# Patient Record
Sex: Female | Born: 1960 | Race: Black or African American | Hispanic: No | Marital: Single | Smoking: Never smoker
Health system: Southern US, Community
[De-identification: ages and names within clinical notes are randomized; demographics above are authoritative.]

## PROBLEM LIST (undated history)

## (undated) DIAGNOSIS — E119 Type 2 diabetes mellitus without complications: Secondary | ICD-10-CM

## (undated) DIAGNOSIS — R0789 Other chest pain: Secondary | ICD-10-CM

## (undated) DIAGNOSIS — I499 Cardiac arrhythmia, unspecified: Secondary | ICD-10-CM

## (undated) DIAGNOSIS — I5189 Other ill-defined heart diseases: Secondary | ICD-10-CM

## (undated) DIAGNOSIS — D509 Iron deficiency anemia, unspecified: Secondary | ICD-10-CM

## (undated) DIAGNOSIS — Q244 Congenital subaortic stenosis: Secondary | ICD-10-CM

## (undated) DIAGNOSIS — I480 Paroxysmal atrial fibrillation: Secondary | ICD-10-CM

## (undated) DIAGNOSIS — F1911 Other psychoactive substance abuse, in remission: Secondary | ICD-10-CM

## (undated) DIAGNOSIS — K219 Gastro-esophageal reflux disease without esophagitis: Secondary | ICD-10-CM

## (undated) DIAGNOSIS — R011 Cardiac murmur, unspecified: Secondary | ICD-10-CM

## (undated) DIAGNOSIS — I10 Essential (primary) hypertension: Secondary | ICD-10-CM

## (undated) HISTORY — DX: Type 2 diabetes mellitus without complications: E11.9

## (undated) HISTORY — DX: Paroxysmal atrial fibrillation: I48.0

## (undated) HISTORY — DX: Other ill-defined heart diseases: I51.89

---

## 2004-06-08 ENCOUNTER — Ambulatory Visit (HOSPITAL_COMMUNITY): Admission: RE | Admit: 2004-06-08 | Discharge: 2004-06-08 | Payer: Self-pay | Admitting: Internal Medicine

## 2004-06-18 ENCOUNTER — Emergency Department (HOSPITAL_COMMUNITY): Admission: EM | Admit: 2004-06-18 | Discharge: 2004-06-18 | Payer: Self-pay | Admitting: Family Medicine

## 2004-07-06 ENCOUNTER — Other Ambulatory Visit: Admission: RE | Admit: 2004-07-06 | Discharge: 2004-07-06 | Payer: Self-pay | Admitting: Family Medicine

## 2004-08-13 ENCOUNTER — Ambulatory Visit: Payer: Self-pay | Admitting: Family Medicine

## 2005-09-06 ENCOUNTER — Ambulatory Visit: Payer: Self-pay | Admitting: Family Medicine

## 2005-09-09 ENCOUNTER — Ambulatory Visit: Payer: Self-pay | Admitting: Family Medicine

## 2005-10-21 ENCOUNTER — Encounter (INDEPENDENT_AMBULATORY_CARE_PROVIDER_SITE_OTHER): Payer: Self-pay | Admitting: Family Medicine

## 2005-11-08 ENCOUNTER — Ambulatory Visit: Payer: Self-pay | Admitting: Family Medicine

## 2005-11-08 ENCOUNTER — Other Ambulatory Visit: Admission: RE | Admit: 2005-11-08 | Discharge: 2005-11-08 | Payer: Self-pay | Admitting: Family Medicine

## 2005-11-26 ENCOUNTER — Ambulatory Visit: Payer: Self-pay | Admitting: Internal Medicine

## 2005-11-27 ENCOUNTER — Inpatient Hospital Stay (HOSPITAL_COMMUNITY): Admission: EM | Admit: 2005-11-27 | Discharge: 2005-11-30 | Payer: Self-pay | Admitting: Emergency Medicine

## 2005-11-28 ENCOUNTER — Encounter: Payer: Self-pay | Admitting: Cardiology

## 2005-12-02 ENCOUNTER — Emergency Department (HOSPITAL_COMMUNITY): Admission: EM | Admit: 2005-12-02 | Discharge: 2005-12-02 | Payer: Self-pay | Admitting: *Deleted

## 2005-12-05 ENCOUNTER — Ambulatory Visit: Payer: Self-pay | Admitting: Family Medicine

## 2005-12-08 ENCOUNTER — Ambulatory Visit: Payer: Self-pay | Admitting: Family Medicine

## 2005-12-12 ENCOUNTER — Emergency Department (HOSPITAL_COMMUNITY): Admission: EM | Admit: 2005-12-12 | Discharge: 2005-12-12 | Payer: Self-pay | Admitting: Emergency Medicine

## 2005-12-13 ENCOUNTER — Ambulatory Visit: Payer: Self-pay | Admitting: Family Medicine

## 2005-12-15 ENCOUNTER — Ambulatory Visit: Payer: Self-pay | Admitting: Family Medicine

## 2005-12-19 ENCOUNTER — Ambulatory Visit: Payer: Self-pay | Admitting: Family Medicine

## 2005-12-20 ENCOUNTER — Ambulatory Visit (HOSPITAL_COMMUNITY): Admission: RE | Admit: 2005-12-20 | Discharge: 2005-12-20 | Payer: Self-pay | Admitting: Family Medicine

## 2005-12-23 ENCOUNTER — Ambulatory Visit: Payer: Self-pay | Admitting: Internal Medicine

## 2005-12-28 ENCOUNTER — Ambulatory Visit: Payer: Self-pay | Admitting: Family Medicine

## 2006-01-18 ENCOUNTER — Ambulatory Visit: Payer: Self-pay | Admitting: Family Medicine

## 2006-02-06 ENCOUNTER — Ambulatory Visit: Payer: Self-pay | Admitting: Family Medicine

## 2006-02-16 ENCOUNTER — Ambulatory Visit: Payer: Self-pay | Admitting: Family Medicine

## 2006-03-28 ENCOUNTER — Ambulatory Visit: Payer: Self-pay | Admitting: Family Medicine

## 2006-06-22 ENCOUNTER — Ambulatory Visit: Payer: Self-pay | Admitting: Family Medicine

## 2006-06-29 ENCOUNTER — Ambulatory Visit: Payer: Self-pay | Admitting: Internal Medicine

## 2006-07-20 ENCOUNTER — Ambulatory Visit: Payer: Self-pay | Admitting: Family Medicine

## 2006-09-29 ENCOUNTER — Ambulatory Visit: Payer: Self-pay | Admitting: Family Medicine

## 2006-10-18 ENCOUNTER — Ambulatory Visit: Payer: Self-pay | Admitting: Family Medicine

## 2006-11-18 ENCOUNTER — Inpatient Hospital Stay (HOSPITAL_COMMUNITY): Admission: AD | Admit: 2006-11-18 | Discharge: 2006-11-18 | Payer: Self-pay | Admitting: Obstetrics and Gynecology

## 2006-12-22 ENCOUNTER — Ambulatory Visit (HOSPITAL_COMMUNITY): Admission: RE | Admit: 2006-12-22 | Discharge: 2006-12-22 | Payer: Self-pay | Admitting: Internal Medicine

## 2007-01-09 ENCOUNTER — Emergency Department (HOSPITAL_COMMUNITY): Admission: EM | Admit: 2007-01-09 | Discharge: 2007-01-09 | Payer: Self-pay | Admitting: Emergency Medicine

## 2007-04-30 ENCOUNTER — Encounter (INDEPENDENT_AMBULATORY_CARE_PROVIDER_SITE_OTHER): Payer: Self-pay | Admitting: Family Medicine

## 2007-04-30 DIAGNOSIS — F1420 Cocaine dependence, uncomplicated: Secondary | ICD-10-CM | POA: Insufficient documentation

## 2007-04-30 DIAGNOSIS — A599 Trichomoniasis, unspecified: Secondary | ICD-10-CM | POA: Insufficient documentation

## 2007-04-30 DIAGNOSIS — K219 Gastro-esophageal reflux disease without esophagitis: Secondary | ICD-10-CM | POA: Insufficient documentation

## 2007-04-30 DIAGNOSIS — I10 Essential (primary) hypertension: Secondary | ICD-10-CM | POA: Insufficient documentation

## 2007-05-02 ENCOUNTER — Ambulatory Visit: Payer: Self-pay | Admitting: Internal Medicine

## 2007-05-15 ENCOUNTER — Emergency Department (HOSPITAL_COMMUNITY): Admission: EM | Admit: 2007-05-15 | Discharge: 2007-05-15 | Payer: Self-pay | Admitting: Emergency Medicine

## 2007-05-17 ENCOUNTER — Ambulatory Visit: Payer: Self-pay | Admitting: Internal Medicine

## 2007-06-27 ENCOUNTER — Ambulatory Visit: Payer: Self-pay | Admitting: Internal Medicine

## 2007-06-29 ENCOUNTER — Ambulatory Visit: Payer: Self-pay | Admitting: Internal Medicine

## 2007-08-24 ENCOUNTER — Ambulatory Visit: Payer: Self-pay | Admitting: Internal Medicine

## 2007-10-10 ENCOUNTER — Ambulatory Visit: Payer: Self-pay | Admitting: Internal Medicine

## 2007-10-10 ENCOUNTER — Encounter: Payer: Self-pay | Admitting: Internal Medicine

## 2007-10-10 ENCOUNTER — Encounter (INDEPENDENT_AMBULATORY_CARE_PROVIDER_SITE_OTHER): Payer: Self-pay | Admitting: Family Medicine

## 2007-10-10 ENCOUNTER — Other Ambulatory Visit: Admission: RE | Admit: 2007-10-10 | Discharge: 2007-10-10 | Payer: Self-pay | Admitting: Family Medicine

## 2007-10-10 LAB — CONVERTED CEMR LAB
AST: 23 units/L (ref 0–37)
Albumin: 4.3 g/dL (ref 3.5–5.2)
Alkaline Phosphatase: 58 units/L (ref 39–117)
Basophils Relative: 0 % (ref 0–1)
Chloride: 106 meq/L (ref 96–112)
Eosinophils Relative: 1 % (ref 0–5)
Glucose, Bld: 87 mg/dL (ref 70–99)
HDL: 55 mg/dL (ref >39)
LDL Cholesterol: 59 mg/dL (ref 0–99)
Lymphocytes Relative: 21 % (ref 12–46)
Lymphs Abs: 0.9 10*3/uL (ref 0.7–4.0)
Monocytes Absolute: 0.3 10*3/uL (ref 0.1–1.0)
Monocytes Relative: 6 % (ref 3–12)
Platelets: 184 10*3/uL (ref 150–400)
RDW: 14.1 % (ref 11.5–15.5)
Total Bilirubin: 0.3 mg/dL (ref 0.3–1.2)
VLDL: 34 mg/dL (ref 0–40)

## 2007-10-22 ENCOUNTER — Ambulatory Visit: Payer: Self-pay | Admitting: Family Medicine

## 2007-12-03 ENCOUNTER — Ambulatory Visit: Payer: Self-pay | Admitting: Family Medicine

## 2007-12-27 ENCOUNTER — Ambulatory Visit (HOSPITAL_COMMUNITY): Admission: RE | Admit: 2007-12-27 | Discharge: 2007-12-27 | Payer: Self-pay | Admitting: Family Medicine

## 2008-09-15 ENCOUNTER — Ambulatory Visit: Payer: Self-pay | Admitting: Internal Medicine

## 2008-10-13 ENCOUNTER — Ambulatory Visit: Payer: Self-pay

## 2008-10-13 ENCOUNTER — Encounter: Payer: Self-pay | Admitting: Internal Medicine

## 2008-10-28 ENCOUNTER — Ambulatory Visit: Payer: Self-pay | Admitting: Internal Medicine

## 2008-10-28 ENCOUNTER — Encounter (INDEPENDENT_AMBULATORY_CARE_PROVIDER_SITE_OTHER): Payer: Self-pay | Admitting: Adult Health

## 2008-10-28 LAB — CONVERTED CEMR LAB
ALT: 17 units/L (ref 0–35)
AST: 17 units/L (ref 0–37)
Albumin: 4.7 g/dL (ref 3.5–5.2)
Alkaline Phosphatase: 54 units/L (ref 39–117)
Amphetamine Screen, Ur: NEGATIVE
BUN: 14 mg/dL (ref 6–23)
Barbiturate Quant, Ur: NEGATIVE
Basophils Absolute: 0 10*3/uL (ref 0.0–0.1)
Basophils Relative: 0 % (ref 0–1)
Benzodiazepines.: NEGATIVE
CO2: 22 meq/L (ref 19–32)
Chlamydia, DNA Probe: NEGATIVE
Chloride: 103 meq/L (ref 96–112)
Cholesterol: 175 mg/dL (ref 0–200)
Creatinine, Ser: 0.69 mg/dL (ref 0.40–1.20)
Eosinophils Relative: 1 % (ref 0–5)
HCT: 37.2 % (ref 36.0–46.0)
HDL: 59 mg/dL (ref >39)
Hemoglobin: 11.9 g/dL — ABNORMAL LOW (ref 12.0–15.0)
Lymphocytes Relative: 20 % (ref 12–46)
Lymphs Abs: 0.9 10*3/uL (ref 0.7–4.0)
MCHC: 32 g/dL (ref 30.0–36.0)
Marijuana Metabolite: NEGATIVE
Monocytes Relative: 5 % (ref 3–12)
Neutrophils Relative %: 75 % (ref 43–77)
Opiate Screen, Urine: NEGATIVE
Platelets: 196 10*3/uL (ref 150–400)
RBC: 3.97 M/uL (ref 3.87–5.11)
RDW: 14.7 % (ref 11.5–15.5)
Sodium: 142 meq/L (ref 135–145)
Total CHOL/HDL Ratio: 3
Total Protein: 8.5 g/dL — ABNORMAL HIGH (ref 6.0–8.3)

## 2009-01-01 ENCOUNTER — Ambulatory Visit (HOSPITAL_COMMUNITY): Admission: RE | Admit: 2009-01-01 | Discharge: 2009-01-01 | Payer: Self-pay | Admitting: Family Medicine

## 2009-02-23 ENCOUNTER — Ambulatory Visit: Payer: Self-pay | Admitting: Internal Medicine

## 2009-02-23 ENCOUNTER — Encounter (INDEPENDENT_AMBULATORY_CARE_PROVIDER_SITE_OTHER): Payer: Self-pay | Admitting: Adult Health

## 2009-02-23 LAB — CONVERTED CEMR LAB
ALT: 24 units/L (ref 0–35)
Alkaline Phosphatase: 56 units/L (ref 39–117)
Calcium: 9.2 mg/dL (ref 8.4–10.5)
Chloride: 106 meq/L (ref 96–112)
Cholesterol: 126 mg/dL (ref 0–200)
Creatinine, Ser: 0.63 mg/dL (ref 0.40–1.20)
Eosinophils Relative: 1 % (ref 0–5)
Glucose, Bld: 96 mg/dL (ref 70–99)
HCT: 34.7 % — ABNORMAL LOW (ref 36.0–46.0)
HDL: 60 mg/dL (ref >39)
Hemoglobin: 11.4 g/dL — ABNORMAL LOW (ref 12.0–15.0)
LDL Cholesterol: 42 mg/dL (ref 0–99)
Lymphs Abs: 0.8 10*3/uL (ref 0.7–4.0)
MCV: 97.7 fL (ref 78.0–100.0)
Monocytes Absolute: 0.3 10*3/uL (ref 0.1–1.0)
Potassium: 4.2 meq/L (ref 3.5–5.3)
Sodium: 140 meq/L (ref 135–145)
Total CHOL/HDL Ratio: 2.1
VLDL: 24 mg/dL (ref 0–40)
Vit D, 25-Hydroxy: 35 ng/mL (ref 30–89)
WBC: 4.3 10*3/uL (ref 4.0–10.5)

## 2009-07-24 ENCOUNTER — Ambulatory Visit: Payer: Self-pay | Admitting: Internal Medicine

## 2009-07-24 ENCOUNTER — Encounter (INDEPENDENT_AMBULATORY_CARE_PROVIDER_SITE_OTHER): Payer: Self-pay | Admitting: Adult Health

## 2009-07-24 LAB — CONVERTED CEMR LAB: Microalb, Ur: 0.5 mg/dL (ref 0.00–1.89)

## 2009-07-28 ENCOUNTER — Encounter (INDEPENDENT_AMBULATORY_CARE_PROVIDER_SITE_OTHER): Payer: Self-pay | Admitting: Adult Health

## 2009-07-28 LAB — CONVERTED CEMR LAB
AST: 17 units/L (ref 0–37)
Basophils Relative: 0 % (ref 0–1)
Chloride: 106 meq/L (ref 96–112)
Creatinine, Ser: 0.7 mg/dL (ref 0.40–1.20)
HCT: 37.6 % (ref 36.0–46.0)
Hemoglobin: 11.7 g/dL — ABNORMAL LOW (ref 12.0–15.0)
Lymphs Abs: 1 10*3/uL (ref 0.7–4.0)
MCHC: 31.1 g/dL (ref 30.0–36.0)
Monocytes Absolute: 0.2 10*3/uL (ref 0.1–1.0)
Potassium: 4 meq/L (ref 3.5–5.3)
RBC: 3.72 M/uL — ABNORMAL LOW (ref 3.87–5.11)
Total Protein: 7.8 g/dL (ref 6.0–8.3)
WBC: 4.4 10*3/uL (ref 4.0–10.5)

## 2009-08-03 ENCOUNTER — Encounter (INDEPENDENT_AMBULATORY_CARE_PROVIDER_SITE_OTHER): Payer: Self-pay | Admitting: Adult Health

## 2009-08-03 LAB — CONVERTED CEMR LAB: Saturation Ratios: 44 % (ref 20–55)

## 2009-12-08 ENCOUNTER — Encounter (INDEPENDENT_AMBULATORY_CARE_PROVIDER_SITE_OTHER): Payer: Self-pay | Admitting: Adult Health

## 2009-12-08 ENCOUNTER — Ambulatory Visit: Payer: Self-pay | Admitting: Internal Medicine

## 2009-12-08 LAB — CONVERTED CEMR LAB
AST: 19 units/L (ref 0–37)
Albumin: 4.5 g/dL (ref 3.5–5.2)
Alkaline Phosphatase: 67 units/L (ref 39–117)
BUN: 11 mg/dL (ref 6–23)
Calcium: 9.4 mg/dL (ref 8.4–10.5)
Cholesterol: 144 mg/dL (ref 0–200)
Eosinophils Absolute: 0.1 10*3/uL (ref 0.0–0.7)
Eosinophils Relative: 1 % (ref 0–5)
HDL: 52 mg/dL (ref >39)
Hemoglobin: 12.2 g/dL (ref 12.0–15.0)
Lymphocytes Relative: 24 % (ref 12–46)
MCV: 96.1 fL (ref 78.0–100.0)
Monocytes Relative: 6 % (ref 3–12)
Neutrophils Relative %: 69 % (ref 43–77)
Platelets: 180 10*3/uL (ref 150–400)
Sodium: 139 meq/L (ref 135–145)
Total Bilirubin: 0.4 mg/dL (ref 0.3–1.2)
Total CHOL/HDL Ratio: 2.8
Total Protein: 7.9 g/dL (ref 6.0–8.3)
VLDL: 30 mg/dL (ref 0–40)
WBC: 5 10*3/uL (ref 4.0–10.5)

## 2010-01-05 ENCOUNTER — Ambulatory Visit: Payer: Self-pay | Admitting: Internal Medicine

## 2010-01-05 DIAGNOSIS — I359 Nonrheumatic aortic valve disorder, unspecified: Secondary | ICD-10-CM | POA: Insufficient documentation

## 2010-01-05 DIAGNOSIS — I498 Other specified cardiac arrhythmias: Secondary | ICD-10-CM | POA: Insufficient documentation

## 2010-01-28 ENCOUNTER — Encounter: Payer: Self-pay | Admitting: Internal Medicine

## 2010-02-08 ENCOUNTER — Ambulatory Visit (HOSPITAL_COMMUNITY): Admission: RE | Admit: 2010-02-08 | Discharge: 2010-02-08 | Payer: Self-pay | Admitting: Family Medicine

## 2010-02-18 ENCOUNTER — Encounter: Payer: Self-pay | Admitting: Internal Medicine

## 2010-02-18 ENCOUNTER — Ambulatory Visit: Payer: Self-pay

## 2010-02-18 ENCOUNTER — Ambulatory Visit (HOSPITAL_COMMUNITY): Admission: RE | Admit: 2010-02-18 | Discharge: 2010-02-18 | Payer: Self-pay | Admitting: Internal Medicine

## 2010-02-18 ENCOUNTER — Ambulatory Visit: Payer: Self-pay | Admitting: Cardiology

## 2010-09-10 ENCOUNTER — Encounter (INDEPENDENT_AMBULATORY_CARE_PROVIDER_SITE_OTHER): Payer: Self-pay | Admitting: *Deleted

## 2010-09-10 LAB — CONVERTED CEMR LAB
BUN: 11 mg/dL (ref 6–23)
Calcium: 9.9 mg/dL (ref 8.4–10.5)
Chloride: 105 meq/L (ref 96–112)
Creatinine, Ser: 0.56 mg/dL (ref 0.40–1.20)
Potassium: 4.2 meq/L (ref 3.5–5.3)
Sodium: 142 meq/L (ref 135–145)

## 2010-12-21 NOTE — Assessment & Plan Note (Signed)
Summary: 1 YR/DMP   Primary Provider:  [facility]  CC:  no complaints.  History of Present Illness: Jane Arellano  is a very pleasant 50 year old woman with history of atypical chest pain and normal Myoview in January 2007. She also has a history of subaortic membrane with a mean LV outflow tract gradient of about 27 mmHg by echo in 2009.  This has been stable.  Remainder of her medical history is notable for gastroesophageal reflux disease, hypertension, iron-deficiency anemia, and polysubstance abuse which is now much better.  She returns today for yearly followup.   Doing well. Fairly active. No CP, SOB or lightheadedness. No palpitations. Not smoking cigarettes. No edema. BP relatively well controlled. Having problems with GERD.   Current Medications (verified): 1)  Metoprolol Tartrate 25 Mg Tabs (Metoprolol Tartrate) .... Take 1 /12 Tablet By Mouth Twice A Day 2)  Ferrous Fumarate 325 (106 Fe) Mg Tabs (Ferrous Fumarate) .... Once Daily 3)  Sertraline Hcl 100 Mg Tabs (Sertraline Hcl) .... Once Daily 4)  Furosemide 20 Mg Tabs (Furosemide) .... Once Daily 5)  Omeprazole 40 Mg Cpdr (Omeprazole) .... Once Daily 6)  Lotrel 10-20 Mg Caps (Amlodipine Besy-Benazepril Hcl) .Marland Kitchen.. 1 By Mouth Once Daily 7)  Multivitamins  Tabs (Multiple Vitamin) .... Once Daily 8)  Potassium Chloride Cr 10 Meq Cr-Caps (Potassium Chloride) .... Take One Tablet By Mouth Daily 9)  Calcium Carbonate-Vitamin D 600-400 Mg-Unit  Tabs (Calcium Carbonate-Vitamin D) .... Once Daily 10)  Clonazepam 0.5 Mg Tabs (Clonazepam) .... As Needed  Allergies (verified): 1)  ! Toradol  Past History:  Past Medical History: Last updated: 12/30/2008 1.history of subaortic membrane with a mean LV outflow   tract gradient of about 27 mmHg. 2. gastroesophageal reflux disease 3.hypertension 4.iron-deficiency anemia 5.polysubstance abuse  Review of Systems       As per HPI and past medical history; otherwise all systems  negative.   Vital Signs:  Patient profile:   50 year old female Height:      66 inches Weight:      192 pounds BMI:     31.10 Pulse rate:   69 / minute BP sitting:   138 / 86  (left arm) Cuff size:   regular  Vitals Entered By: Hardin Negus, RMA (January 05, 2010 10:42 AM)  Physical Exam  General:  Gen: well appearing. no resp difficulty HEENT: normal Neck: supple. no JVD. Carotids 2+ bilat; bilateral radiated bruits. No lymphadenopathy or thryomegaly appreciated. Cor: PMI nondisplaced. Regular rate & rhythm. No rubs, gallops. 2/6 SEM along precordium Lungs: clear Abdomen: soft, nontender, nondistended. No hepatosplenomegaly. No bruits or masses. Good bowel sounds. Extremities: no cyanosis, clubbing, rash, edema Neuro: alert & orientedx3, cranial nerves grossly intact. moves all 4 extremities w/o difficulty. affect pleasant    Impression & Recommendations:  Problem # 1:  AORTIC VALVE DISORDERS (ICD-424.1) Stable without symptoms. Will check f/u echo. See her back in 2 years. Told her to contact me if she dvelops any CP, lightheadedness or dyspnea.   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 Stable. No further work-up at this time.   Other Orders: Echocardiogram (Echo)  Patient Instructions: 1)  Your physician has requested that you have an echocardiogram.  Echocardiography is a painless test that uses sound waves to create images of your heart. It provides your doctor with information about the size and shape of your heart and how well your heart's chambers and valves are working.  This procedure  takes approximately one hour. There are no restrictions for this procedure. 2)  Follow up in 2 years

## 2010-12-21 NOTE — Letter (Signed)
Summary: Generic Letter  Architectural technologist, Main Office  [address]   [place], [place] [address]   Phone: [phone]  Fax: [phone]        January 28, 2010 MRN: [id number]    [facility] [address] [place], [place]  [address]    Dear Ms. Naas,  Our records indicate that you have cancelled your echocardiogram.  It is Dr Shelsie Tijerino's recommendation that you have this test done.  Please contact our office as soon as possible to reschedule.     Sincerely,  Meredith Staggers, RN Arvilla Meres, MD  This letter has been electronically signed by your physician.

## 2011-04-05 NOTE — Assessment & Plan Note (Signed)
[facility]                            CARDIOLOGY OFFICE NOTE   Jane Arellano, MARINEZ                     MRN:          [id number]  DATE:09/15/2008                            DOB:          02/12/1961    PRIMARY CARE SERVICE:  [facility]   INTERVAL HISTORY:  Jane Arellano is a very pleasant 50 year old woman  with history of atypical chest pain and normal Myoview in January 2007.  She also has a history of subaortic membrane with a mean LV outflow  tract gradient of about 27 mmHg.  This has been stable.  Remainder of  her medical history is notable for gastroesophageal reflux disease,  hypertension, iron-deficiency anemia, and polysubstance abuse which is  now much better.  She returns today for followup.   She is doing great.  She denies any chest pain.  No shortness of breath.  No lower extremity edema.  No palpitations.  No syncope or presyncope.  She does drink an occasional beer, but has been much better about this.  She has gained some weight recently.  She is not smoking.   REVIEW OF SYSTEMS:  As above.  Other systems are negative.   CURRENT MEDICATIONS:  1. Prilosec 40 mg a day.  2. Clonidine 0.1 a day.  3. Amlodipine/benazepril 10/20 a day.  4. Lasix 20 a day.  5. Multivitamin with iron.  6. Metoprolol 25 b.i.d.  7. Calcium.  8. Zoloft 100 mg a day.   PHYSICAL EXAMINATION:  GENERAL:  She is well-appearing in no acute  distress.  She ambulates around the clinic without any respiratory  difficulty.  VITAL SIGNS:  Blood pressure is 128/78, heart rate 56, and weight is  192.  HEENT:  Normal.  NECK:  Supple.  No JVD.  Carotids are 2+ bilaterally with bilateral  radiated bruits.  There is no lymphadenopathy or thyromegaly.  CARDIAC:  She has regular rate and rhythm.  PMI is nondisplaced.  She  has 2/6 systolic ejection murmur across the LV outflow tract.  S2 is  well preserved.  LUNGS:  Clear.  ABDOMEN:  Soft, nontender, and  nondistended.  No hepatosplenomegaly.  No  bruits.  No masses.  Good bowel sounds.  EXTREMITIES:  Warm with no cyanosis, clubbing, or edema.  No rash.  NEUROLOGIC:  Alert and oriented x3.  Cranial nerves II through XII are  intact.  Moves all fours extremities without difficulty.  Affect is  pleasant.   EKG shows sinus bradycardia at a rate of 55.  No ST-T wave  abnormalities.   ASSESSMENT/PLAN:  1. Subaortic membrane, this is stable and asymptomatic.  She will need      her follow up echocardiogram.  2,  Hypertension, well-controlled.   DISPOSITION:  We will see her back in clinic in 1 year for routine  followup.     Bevelyn Buckles. Bensimhon, MD  Electronically Signed    DRB/MedQ  DD: 09/15/2008  DT: 09/16/2008  Job #: [id number]   cc:   Melvern Banker

## 2011-04-05 NOTE — Assessment & Plan Note (Signed)
[facility]                            CARDIOLOGY OFFICE NOTE   SERENA, PETTERSON                     MRN:          [id number]  DATE:08/24/2007                            DOB:          [birth year]    PRIMARY CARE Claritza July:  [facility].   INTERVAL HISTORY:  Jane Arellano is a very pleasant 50 year old woman  with a history of atypical chest pain with a normal Myoview in January  2007.  She also has a subaortic membrane with a mean gradient across the  LV outflow tract of about 27 mmHg.  This has been stable.  Remainder of  her medical history is notable for gastroesophageal reflux disease,  hypertension, iron deficiency anemia, and polysubstance abuse, which is  now much better.  She returns today for routine followup.  She has  missed her past couple of appointments.   Overall, she is doing fairly well.  She denies any shortness of breath  or chest pain.  She has been fairly active.  Unfortunately, she has been  out of her medications.  She has been trying fairly hard to get these  from [facility], but has not been able to obtain them just yet.  She  denies any lower extremity edema.  No orthopnea, no PND, no syncope or  presyncope.   CURRENT MEDICATIONS:  1. Prevacid 30 mg b.i.d.  2. Iron, which she is out of.  3. Centrum Silver.  4. She is out of Clonidine 0.1 mg a day.  5. She is taking Zoloft.  She is out.  6. Lotrel 10/20, she is out.  7. Lasix 20 a day she is taking.  8. Metoprolol 25 b.i.d., which she is taking.   PHYSICAL EXAM:  She is in no acute distress, ambulates around the clinic  without any respiratory difficulty.  Blood pressure is 138/70, heart rate is 55, weight is 184.  HEENT:  Normal.  NECK:  Supple.  There is no JVD.  Carotids are 2+ bilaterally with  bilateral radiated bruits.  There is no lymphadenopathy or thyromegaly.  CARDIAC:  She has a regular rate and rhythm.  PMI is nondisplaced.  She  has 2-3/6 systolic  ejection murmur across the LV outflow tract.  S2 is  well-preserved.  LUNGS:  Clear.  ABDOMEN:  Soft, nontender, nondistended, no hepatosplenomegaly, no  bruits, no masses, good bowel sounds.  EXTREMITIES:  Warm with no cyanosis, clubbing or edema.  No rash.  NEUROLOGIC:  Alert and oriented times three.  Cranial nerves II through  XII are intact.  She moves all four extremities without difficulty.  Affect is pleasant.   EKG shows sinus bradycardia at a rate of 55 with early repolarization.   ASSESSMENT AND PLAN:  1. Subaortic membrane.  This is stable.  Currently no indications for      surgery.  We will follow up in one year.  2. Hypertension.  The blood pressure is slightly elevated.  She will      follow up with [facility] for control of her blood pressure and      other risk factor modification.  I told her, if she cannot get her      medications within a week to a week and a half, that she should      call me and we can try and help her.     Bevelyn Buckles. Bensimhon, MD  Electronically Signed    DRB/MedQ  DD: 08/24/2007  DT: 08/24/2007  Job #: [id number]

## 2011-04-08 NOTE — Discharge Summary (Signed)
[name], Arellano              ACCOUNT NO.:  [id number]   MEDICAL RECORD NO.:  [id number]          PATIENT TYPE:  INP   LOCATION:  4710                         FACILITY:  [facility]   PHYSICIAN:  Theone Stanley, MD   DATE OF BIRTH:  [birth year]   DATE OF ADMISSION:  11/26/2005  DATE OF DISCHARGE:                                 DISCHARGE SUMMARY   ADMISSION DIAGNOSES:  1.  Atypical chest pain.  2.  Gastroesophageal reflux disease.  3.  Anemia.  4.  Marijuana use.   DISCHARGE DIAGNOSES:  1.  Atypical chest pain, noncardiac, most likely gastrointestinal-related.  2.  Gastroesophageal reflux disease.  3.  Anemia with mild iron deficiency.  4.  Hypertension, new onset, most likely essential.  5.  Systolic murmur.   CONSULTATIONS:  [facility].   PROCEDURES AND DIAGNOSTIC TESTS:  The patient had a stress Myoview, which  showed no ischemia, EF of 60%, with breast attenuation.  Echo performed on  January 8 showed overall left ventricular systolic function was normal, left  ventricular ejection fraction estimated at 55-65%, no diagnostic evidence of  left ventricular regional wall motion abnormalities, aortic valve thickness  was minimally increased.  There was mild aortic valvular regurgitation, mean  transvalvular gradient was 27 mmHg, estimated valve area was 1.31 sq. cm.  The left atrium was mildly dilated.  Impression:  Transesophageal  echocardiogram was advised for further evaluation of apparent LVOT/aortic  gradient.   HOSPITAL COURSE:  Jane Arellano is a 50 year old female who presented to the  hospital on the 6th with complaint of chest pain.  She has a history of  marijuana abuse and a family history of heart disease, history of  hypertension, presenting with chest pain.  She does have a history of GERD,  for which she takes Prevacid daily.  The patient states that she also has a  history of anemia, etiology of which is unclear.  She does take iron  tablets.  Her  chest pain occurred for 24 hours, substernal pressure type,  radiating to the left arm with some  numbness.  Pain was moderate in  intensity and was relieved by nitroglycerin patch in the emergency room.  She has not had any nausea or vomiting or shortness of breath associated  with this.  In the ER, an EKG was performed which did show some ST-T wave  abnormalities but no evidence of acute event.  Her cardiac markers were  negative.  At that point in time the patient was admitted for further  evaluation.  She was placed on telemetry, which showed normal sinus rhythm.  Cardiac enzymes x3 were all negative.  However, because of significance of  family history and the EKG changes, it was felt that it would be best to get  cardiology involved.  [facility] was contacted.  They were kind  enough to come by and see the patient.  A stress Myoview was performed and  an echocardiogram, please see results above.  We will discuss with  cardiology to see whether a transesophageal echocardiogram needs to be done  here in the  hospital or as an outpatient basis.  In regard to the positive  breast perfusion, I discussed with the patient that she needs a mammogram.  She states that she actually has an appointment sometime this week.  I  encouraged her to keep this appointment, since she will need her mammogram  especially with positive breast attenuation on perfusion scan.  In addition,  with her history of GERD, it was felt that this is GI in nature.  She will  need a further GI workup as an outpatient.  The patient will need to  continue medications for her hypertension.  I started her on Ismo and  lisinopril.  In regard to her anemia, again, a gastrointestinal workup  should be initiated on an outpatient basis.   DISCHARGE MEDICATIONS:  1.  Prevacid 30 mg one p.o. daily.  2.  Iron tablets 65 mg p.o. daily.  3.  Ismo at 20 mg one p.o. b.i.d.  4.  Lisinopril 5 mg one p.o. daily.   She will need  to follow up with [facility] in one to two weeks, and at that  time initiation for GI and further cardiac workup will be entertained by her  primary care physician.      Theone Stanley, MD  Electronically Signed     AEJ/MEDQ  D:  11/28/2005  T:  11/29/2005  Job:  [id number]

## 2011-04-08 NOTE — Consult Note (Signed)
Jane Arellano, Jane Arellano              ACCOUNT NO.:  [id number]   MEDICAL RECORD NO.:  [id number]          PATIENT TYPE:  INP   LOCATION:  4710                         FACILITY:  [facility]   PHYSICIAN:  Jane Arellano, M.D. LHCDATE OF BIRTH:  1960/11/28   DATE OF CONSULTATION:  11/27/2005  DATE OF DISCHARGE:                                   CONSULTATION   PRIMARY CARE PHYSICIAN:  Jane Modena, NP, [facility].   CONSULTING PHYSICIAN:  Jane Arellano, M.D.   REASON FOR CONSULTATION:  Chest pain.   HISTORY OF PRESENT ILLNESS:  Jane Arellano is a very pleasant 50 year old  woman with a history of heavy alcohol use and borderline hypertension but no  known heart disease.  She has never had a stress test or a catheterization.  She reports a one-year history of intermittent chest pain which has been  thought secondary to gastroesophageal reflux disease and possibly peptic  ulcer disease.  She was treated for H. pylori and presumed anemia in October  of 2006.  Over the New Year's holiday, she says she drank quite heavily.  Since that time she has had an increase in her chest pain.  She says that  she has frequent episodes.  It is much worse with eating and lying down and  better with burping and Zantac.  this is not reproducible with exertion.  She denies any radiation.  There has been no diaphoresis or shortness of  breath.  She denies any melena or bright red blood per rectum.  She came to  the ER secondary to persistent chest pain.  EKG showed a question of a  previous septal infarct.  First set of cardiac enzymes were negative.   CURRENT MEDICATION:  Prevacid.   ALLERGIES:  NO KNOWN DRUG ALLERGIES.   REVIEW OF SYSTEMS:  As per HPI and problem list.  She endorses heavily  emotional stress.  She also has some arthritis pain.  Otherwise, all systems  negative except for HPI and problem list.   PAST MEDICAL HISTORY:  1.  Gastroesophageal reflux disease with possible peptic ulcer  disease,      status post previous treatment for H. pylori in October of 2006.  2.  Borderline hypertension.  3.  Heavy ETOH use, ongoing.   SOCIAL HISTORY:  [place]  She is currently  unemployed.  Occasionally works as a Advertising copywriter.  She has two teenager  daughters.  She is single.  She denies any tobacco use.  She does smoke  occasional marijuana and no cocaine.  She drinks about five beers and some  liquor almost every day.  She states she has not drank since [place]  because she has not been feeling well.   FAMILY HISTORY:  There is no family history of premature coronary disease.  Jane Arellano is alive, has hypertension and seizures.  Jane Arellano died due to diabetes  and complications from a dialysis.   PHYSICAL EXAMINATION:  GENERAL APPEARANCE:  She is lying in bed, in no acute  distress.  Respirations are unlabored.  VITAL SIGNS:  Temperature is 97.6, blood  pressure 143/81, heart rate 64, she  is sating 100% on two liters.  HEENT:  Sclerae are anicteric.  EOMI.  There are no xanthelasma.  Moist  mucous membranes.  NECK:  Supple.  JVP is about 6 cm of water.  Carotids are 2+ bilaterally  with bilateral radiated murmurs.  There is no lymphadenopathy or  thyromegaly.  LUNGS:  Clear to auscultation.  CARDIOVASCULAR:  Her PMI is nondisplaced.  She has a regular rate and rhythm  with a 2/6 systolic ejection murmur at the right sternal border with  decreases of inspiration at 2/6 holosystolic murmur at the apex.  There is  no rub or gallop.  ABDOMEN:  Soft, nontender, nondistended, there is no hepatosplenomegaly.  There is no rebound or guarding.  There are no bruits or masses.  Epigastrium is totally nontender.  EXTREMITIES:  Warm with no clubbing, cyanosis, or edema.  Distal pulses are  2+ bilaterally.  NEUROLOGIC:  She is alert and oriented x3.  Cranial nerves II-XII intact.  She moves all four extremities without difficulty.  Affect is  appropriate.   White count is 4.9, hemoglobin 10.9, platelets 204.  Sodium 140, potassium  3.3, chloride 109, bicarb 26, BUN 7, creatinine 0.9, glucose 101.  CK-MB 1.1  initially, then 2.1 follow-up.  D-dimer is less than 0.22.  Urine drug  screen is negative.  Initial troponin is less than 0.05.  Total cholesterol  is 157, triglycerides 153, HDL 52, LDL 74.   EKG shows normal sinus rhythm at a rate of 80, questionable old septal  infarct.   Chest x-ray shows mild cardiomegaly but no acute cardiopulmonary process.   ASSESSMENT:  1.  Chest pain, atypical.  2.  Heavy alcohol use.  3.  History of gastroesophageal reflux disease and presumed peptic ulcer      disease (no history of esophagogastroduodenoscopy).  4.  Borderline hypertension.  5.  Murmur.  6.  Anemia.   DISCUSSION/PLAN:  [name] chest pain seems quite atypical and given  her history, is much more likely to be gastrointestinal in nature.  She is  currently ruling out for a myocardial infarction, however, her EKG is mildly  abnormal.  I would continue to rule her out for a myocardial infarction as  you are doing, however, I would discontinue the Lovenox given her risk of  bleeding.  If she rules out, would proceed with treadmill Myoview in the  morning to further evaluate for any possible underlying ischemia, though I  think it is unlikely.  I will also get a 2-D echocardiogram  to evaluate her  murmur more clearly.  She will also need treatment of her hypertension.  I  would consider delirium tremens prophylaxis given her history of alcohol use  and previous withdrawal symptoms.  She may need to be considered for an  esophagogastroduodenoscopy.      Jane Arellano, M.D. [facility]  Electronically Signed     DB/MEDQ  D:  11/27/2005  T:  11/28/2005  Job:  [id number]

## 2011-04-08 NOTE — Discharge Summary (Signed)
[name], [name]              ACCOUNT NO.:  [id number]   MEDICAL RECORD NO.:  [id number]          PATIENT TYPE:  INP   LOCATION:  4710                         FACILITY:  [facility]   PHYSICIAN:  Kela Millin, M.D.DATE OF BIRTH:  09-Jun-1961   DATE OF ADMISSION:  11/26/2005  DATE OF DISCHARGE:  11/30/2005                                 DISCHARGE SUMMARY   ADDENDUM:   ADDENDUM TO PROCEDURES:  TEE done on November 29, 2005--overall left  ventricular systolic function normal, ejection fraction 55% to 65%, small  ridge in the LVOT consistent with a small subvalvular membrane.   ADDENDUM TO HOSPITAL COURSE:  The patient had a 2-D echo on November 28, 2005,  which showed that there was mild aortic valvular regurgitation and the mean  transaortic valve gradient was 27 with an estimated valve area of 1.31 cm2.  Following these findings a TEE was recommended by cardiology to further  evaluate the aortic stenosis and the results are as stated above. I  discussed the patient with Dr. Myrtis Ser following the TEE and he recommended  that the patient be discharged home to follow up with Dr. Gala Romney in about  two weeks--the appointment was to be set up and the patient contacted. The  patient remained chest pain-free for the rest of her hospital stay.   FOLLOWUP CARE:  1.  Dr. Gala Romney with [facility] in two weeks.  2.  [facility] in one to two weeks.   DISCHARGE CONDITION:  Improved/stable.           ______________________________  Kela Millin, M.D.     ACV/MEDQ  D:  11/30/2005  T:  11/30/2005  Job:  [id number]   cc:   [facility]   Dr. Ala Bent, M.D. [facility]  [facility]  [address]  [place]  [place] [address]

## 2011-04-08 NOTE — Assessment & Plan Note (Signed)
[facility]                              CARDIOLOGY OFFICE NOTE   Jane Arellano, Jane Arellano                     MRN:          [id number]  DATE:06/29/2006                            DOB:          [birth year]    PRIMARY CARE [place]:  [name] at [facility].   PATIENT IDENTIFICATION:  Jane Arellano is a very pleasant 50 year old woman  who returns for routine follow-up.   PROBLEM LIST:  1. Atypical chest pain.      a.     Stress Myoview January 2007, ejection fraction 60%, no ischemia.  2. Subaortic membrane.      a.     Transesophageal echocardiogram January 2007 showed normal left       ventricular function with a small ridge in the left ventricular       outflow tract.  Velocity across the membrane was 4 m/sec.      b.     Recent chest wall echocardiogram:  Normal ejection fraction with       a mean gradient across the left ventricular outflow tract at 27 mmHg.       Peak gradient was 56.  This is very stable.  3. Peptic ulcer disease/gastroesophageal reflux disease.  4. Hypertension.  5. Anemia, iron-deficiency.  6. Positive substance abuse, now quit.   CURRENT MEDICATIONS:  1. Prevacid 30 mg a day.  2. Iron.  3. Centrum Silver.  4. Clonidine 0.1 mg a day.  5. Zoloft 50 mg.  6. Lotrel 10/20 mg.  7. Lasix 20 mg a day.  8. Toprol XL 50 mg.   MEDICATION ALLERGIES:  AVAPRO and HYDROCHLOROTHIAZIDE, which she cannot  tolerate.   INTERVAL HISTORY:  Jane Arellano returns today for routine follow-up.  She is  feeling very well.  She has taken to walking 30 minutes a day and also  watching her salt.  She has quit drinking and doing drugs.  She has been  watching her sodium and very concerned about what she eats.  She denies any  chest pain or shortness of breath.  Occasional palpitations.  Recently saw  [name] and her blood pressure medications have been changed last week.   PHYSICAL EXAMINATION:  GENERAL:  She is in no acute distress.   Ambulates  around the clinic without difficulty.  VITAL SIGNS:  Blood pressure is 144/84.  Heart rate is 59.  HEENT:  Sclerae anicteric, EOMI.  There is some exophthalmos.  No  xanthelasmas.  Mucous membranes are moist.  NECK:  Supple.  There is no JVD.  Carotids are 2+ bilaterally with bilateral  bruits radiating from her aortic valve.  No thyromegaly or lymphadenopathy.  CARDIAC:  She has a regular rate and rhythm with a 3/6 mid-peaking systolic  ejection murmur at the right sternal border radiating across the precordium  into the carotids.  S2 is well-preserved.  There is no rub or gallop.  LUNGS:  Clear.  ABDOMEN:  Soft, nontender, nondistended.  There is no hepatosplenomegaly, no  bruits, no masses, good bowel sounds.  EXTREMITIES:  Warm with no clubbing, cyanosis, or  edema.  Distal pulses are  strong.  NEUROLOGIC:  She is alert and oriented x3 with a very pleasant affect.  Cranial nerves II-XII are intact.  She moves all 4 extremities without  difficulty.   EKG shows a sinus bradycardia at a rate of 56 with LVH.  Nonspecific ST-T  wave changes.   ASSESSMENT AND PLAN:  1. Subaortic membrane.  She remains currently asymptomatic at this point.      Would just continue to follow with yearly echocardiograms.  2. Hypertension.  Her blood pressure is slightly improved.  She is      following with [name].  3. Polysubstance abuse.  She has been now quit for almost a year.  I have      congratulated her on this and told her to continue her healthy      lifestyle.   DISPOSITION:  Will see her back in 9 months for routine follow-up and  echocardiogram.                                Bevelyn Buckles. Bensimhon, MD    DRB/MedQ  DD:  06/29/2006  DT:  06/29/2006  Job #:  [id number]   cc:   [name]

## 2011-04-08 NOTE — H&P (Signed)
Jane Arellano, Jane Arellano              ACCOUNT NO.:  [id number]   MEDICAL RECORD NO.:  [id number]          PATIENT TYPE:  INP   LOCATION:  4710                         FACILITY:  [facility]   PHYSICIAN:  Jackie Plum, M.D.DATE OF BIRTH:  Sep 04, 1961   DATE OF ADMISSION:  11/26/2005  DATE OF DISCHARGE:                                HISTORY & PHYSICAL   CHIEF COMPLAINT:  Chest pain.   HISTORY OF PRESENT ILLNESS:  The patient is a 50 year old African-American  lady with a history of marijuana abuse and significant family history of  heart disease without any previous known history of hypertension, who  presents with above complaint.  She has a history of GERD, for which she  takes Prevacid 30 mg daily.  She also has a history of anemia, etiology of  which is unclear, for which she takes iron tablets.  The patient presented  with a 24-hour history of substernal chest pressure, which was radiating to  the left arm with some numbness.  The pain was moderate in intensity and was  relieved by nitroglycerin patch in the emergency room.  She has not had any  nausea, vomiting or shortness of breath.  No fever, no chills, no cough, no  sputum production, no PND or orthopnea.  No lower extremity swelling or pain  noted.  In the emergency room the patient had a 12-lead EKG done, which  showed a septal lateral T-wave inversion.  Cardiac markers were negative.  She was given Lovenox per ACS protocol in addition to previously-  administered nitroglycerin patch, and hospitalist service was asked to  evaluate for admission.   PAST MEDICAL HISTORY:  As noted above.  The patient denies any known history  of hypertension, diabetes or heart disease.  Denies any known history of  dyslipidemia.   FAMILY HISTORY:  Her brother died from heart attack at age 45 years.   SOCIAL HISTORY:  The patient is unemployed and drinks alcohol on a social  basis and smokes cigarettes.   REVIEW OF SYSTEMS:  As noted above,  otherwise unremarkable.   PHYSICAL EXAMINATION:  VITAL SIGNS:  BP 172/90 (BP was 188/105 on  presentation).  Temperature 98.0 degrees Fahrenheit, pulse 88, respirations  20, O2 saturation of 99% on oxygen by nasal cannula.  GENERAL:  The patient was not in acute distress.  She was comfortable-  looking.  HEENT:  Normocephalic, atraumatic.  Pupils were equal, round, and reactive  to light.  Extraocular movements intact.  Oropharynx moist.  The patient has  mild scleral pallor without icterus.  NECK:  Supple, no JVD.  LUNGS:  Vesicular breath sounds without any crackles or wheezes.  CARDIAC:  Regular, no gallop.  ABDOMEN:  Soft, nontender, bowel sounds present.  EXTREMITIES:  No cyanosis, no edema.  CENTRAL NERVOUS SYSTEM:  Nonfocal.   LABORATORY DATA:  A 12-lead EKG as noted above.  The patient had an x-ray of  the chest which showed marked cardiomegaly without any acute infiltrate.  Her sodium was 140, potassium 3.3, chloride 109, BUN 7, glucose 107.  Venous  pH 7.331, venous PCO2 48.3, venous  bicarbonate 25.5.  Hematocrit was 35,  hemoglobin 11.9.  Creatinine 0.9.  Myoglobin was 43.8, CK-MB 1.1, troponin I  less than 0.05.  D-dimer less than 0.22.  Urine drug screen was negative.   IMPRESSION:  1.  Chest pain.  2.  Elevated blood pressure, probably secondary to hypertension though the      patient denies any previous known history.  3.  Anemia.  4.  Hypokalemia.   The patient is admitted for rule out protocol with cycling of her enzymes.  The patient may need a stress test.  Will start the patient on low-dose  Lopressor at this time.      Jackie Plum, M.D.  Electronically Signed     GO/MEDQ  D:  11/27/2005  T:  11/27/2005  Job:  [id number]   cc:   [employer]

## 2011-09-07 LAB — I-STAT 8, (EC8 V) (CONVERTED LAB)
Glucose, Bld: 113 — ABNORMAL HIGH
HCT: 42
Hemoglobin: 14.3
Operator id: 277751
Potassium: 3.4 — ABNORMAL LOW
Sodium: 141
TCO2: 23

## 2011-09-07 LAB — URINALYSIS, ROUTINE W REFLEX MICROSCOPIC
Bilirubin Urine: NEGATIVE
Ketones, ur: NEGATIVE
Nitrite: NEGATIVE
Protein, ur: NEGATIVE

## 2011-09-07 LAB — POCT I-STAT CREATININE: Operator id: 277751

## 2012-12-21 ENCOUNTER — Encounter (HOSPITAL_COMMUNITY): Payer: Self-pay | Admitting: Emergency Medicine

## 2012-12-21 ENCOUNTER — Emergency Department (HOSPITAL_COMMUNITY)
Admission: EM | Admit: 2012-12-21 | Discharge: 2012-12-21 | Disposition: A | Discharge status: Home / Self Care | Payer: Self-pay | Attending: Emergency Medicine | Admitting: Emergency Medicine

## 2012-12-21 ENCOUNTER — Emergency Department (HOSPITAL_COMMUNITY): Payer: Self-pay

## 2012-12-21 DIAGNOSIS — Z79899 Other long term (current) drug therapy: Secondary | ICD-10-CM | POA: Insufficient documentation

## 2012-12-21 DIAGNOSIS — E876 Hypokalemia: Secondary | ICD-10-CM | POA: Insufficient documentation

## 2012-12-21 DIAGNOSIS — J069 Acute upper respiratory infection, unspecified: Secondary | ICD-10-CM | POA: Insufficient documentation

## 2012-12-21 DIAGNOSIS — J3489 Other specified disorders of nose and nasal sinuses: Secondary | ICD-10-CM | POA: Insufficient documentation

## 2012-12-21 DIAGNOSIS — R0982 Postnasal drip: Secondary | ICD-10-CM | POA: Insufficient documentation

## 2012-12-21 DIAGNOSIS — I10 Essential (primary) hypertension: Secondary | ICD-10-CM | POA: Insufficient documentation

## 2012-12-21 LAB — POCT I-STAT, CHEM 8
Calcium, Ion: 1.14 mmol/L (ref 1.12–1.23)
Chloride: 103 mEq/L (ref 96–112)
Glucose, Bld: 158 mg/dL — ABNORMAL HIGH (ref 70–99)
HCT: 40 % (ref 36.0–46.0)
Hemoglobin: 13.6 g/dL (ref 12.0–15.0)
TCO2: 26 mmol/L (ref 0–100)

## 2012-12-21 MED ORDER — AMLODIPINE BESYLATE 10 MG PO TABS
10.0000 mg | ORAL_TABLET | Freq: Once | ORAL | Status: AC
  Administered 2012-12-21: 10 mg via ORAL
  Filled 2012-12-21: qty 1

## 2012-12-21 MED ORDER — POTASSIUM CHLORIDE CRYS ER 20 MEQ PO TBCR: 20.0000 meq | EXTENDED_RELEASE_TABLET | Freq: Every day | ORAL | Status: DC

## 2012-12-21 MED ORDER — POTASSIUM CHLORIDE CRYS ER 20 MEQ PO TBCR
40.0000 meq | EXTENDED_RELEASE_TABLET | Freq: Once | ORAL | Status: AC
  Administered 2012-12-21: 40 meq via ORAL
  Filled 2012-12-21: qty 2

## 2012-12-21 MED ORDER — AMLODIPINE BESYLATE 10 MG PO TABS: 10.0000 mg | ORAL_TABLET | Freq: Every day | ORAL | Status: DC

## 2012-12-21 NOTE — ED Notes (Signed)
Pt states she started with cold sxs on Saturday that has progressively gotten worse  Pt states she has had a cough and is now coughing up small amts of blood  Pt states she is supposed to be on blood pressure medication but has not been taking it as she has no insurance  Pt's blood pressure tonight is elevated

## 2012-12-21 NOTE — ED Provider Notes (Signed)
History     CSN: [id number]  Arrival date & time 12/21/12  0209   First MD Initiated Contact with Patient 12/21/12 0248      Chief Complaint  Patient presents with  . Cough    (Consider location/radiation/quality/duration/timing/severity/associated sxs/prior treatment) HPI Comments: Patient states she has had a URI for the past 6 days last night she notes a small amount of blood in the mucous.  Denies fever, SOB. She reports that she has not taken her blood pressure medication in a long time and tonight feels as though it is elevated.  Denies CP, peripheral edema, visual changes. Has no PCP as she no longer has medical insurance.   Patient is a 52 y.o. female presenting with cough. The history is provided by the patient.  Cough This is a new problem. The current episode started 2 days ago. The problem occurs every few minutes. The problem has not changed since onset.The cough is productive of blood-tinged sputum. There has been no fever. Pertinent negatives include no chest pain, no chills, no headaches, no rhinorrhea, no sore throat and no shortness of breath.    Past Medical History  Diagnosis Date  . Hypertension     History reviewed. No pertinent past surgical history.  Family History  Problem Relation Age of Onset  . Hypertension Other   . Diabetes Other   . Cancer Other     History  Substance Use Topics  . Smoking status: Never Smoker   . Smokeless tobacco: Not on file  . Alcohol Use: Yes    OB History    Grav Para Term Preterm Abortions TAB SAB Ect Mult Living                  Review of Systems  Constitutional: Negative for fever and chills.  HENT: Positive for congestion and postnasal drip. Negative for sore throat, rhinorrhea, trouble swallowing and neck pain.   Eyes: Negative for visual disturbance.  Respiratory: Positive for cough. Negative for shortness of breath.   Cardiovascular: Negative for chest pain and leg swelling.  Gastrointestinal:  Negative for nausea.  Genitourinary: Negative.   Skin: Negative for rash and wound.  Neurological: Negative for dizziness, weakness and headaches.  All other systems reviewed and are negative.    Allergies  Ketorolac tromethamine  Home Medications   Current Outpatient Rx  Name  Route  Sig  Dispense  Refill  . DM-GUAIFENESIN ER 30-600 MG PO TB12   Oral   Take 1 tablet by mouth every 12 (twelve) hours as needed. For cough         . GUAIFENESIN 100 MG/5ML PO LIQD   Oral   Take 200 mg by mouth 4 (four) times daily as needed. For cough         . AMLODIPINE BESYLATE 10 MG PO TABS   Oral   Take 1 tablet (10 mg total) by mouth daily.   30 tablet   2   . POTASSIUM CHLORIDE CRYS ER 20 MEQ PO TBCR   Oral   Take 1 tablet (20 mEq total) by mouth daily.   10 tablet   0     BP 192/101  Pulse 86  Temp 98.5 F (36.9 C) (Oral)  Resp 20  SpO2 99%  Physical Exam  Constitutional: She is oriented to person, place, and time. She appears well-developed and well-nourished. No distress.  HENT:  Head: Normocephalic and atraumatic.  Right Ear: External ear normal.  Left Ear: External ear  normal.  Mouth/Throat: Oropharynx is clear and moist.  Eyes: Pupils are equal, round, and reactive to light. No scleral icterus.  Fundoscopic exam:      The right eye shows no hemorrhage and no papilledema.       The left eye shows no hemorrhage and no papilledema.  Neck: Normal range of motion. Neck supple.  Cardiovascular: Regular rhythm.  Tachycardia present.   No murmur heard. Pulmonary/Chest: Effort normal and breath sounds normal. No respiratory distress. She has no wheezes.  Abdominal: Soft.  Musculoskeletal: Normal range of motion. She exhibits no edema and no tenderness.  Lymphadenopathy:    She has no cervical adenopathy.  Neurological: She is alert and oriented to person, place, and time.  Skin: Skin is warm. No rash noted.    ED Course  Procedures (including critical care  time)  Labs Reviewed  POCT I-STAT, CHEM 8 - Abnormal; Notable for the following:    Potassium 2.9 (*)     Glucose, Bld 158 (*)     All other components within normal limits   Dg Chest 2 View  12/21/2012  *RADIOLOGY REPORT*  Clinical Data: Cough, shortness of breath.  CHEST - 2 VIEW  Comparison: 01/09/2007  Findings: Lungs are clear. No pleural effusion or pneumothorax. The cardiomediastinal contours are within normal limits. The visualized bones and soft tissues are without significant appreciable abnormality.  IMPRESSION: No radiographic evidence of acute cardiopulmonary process.   Original Report Authenticated By: Jearld Lesch, M.D.      1. URI (upper respiratory infection)   2. Hypertension   3. Hypokalemia     ED ECG REPORT   Date: 12/21/2012  EKG Time: 6:10 AM  Rate: 88  Rhythm: normal sinus rhythm,  unchanged from previous tracings  Axis: normal  Intervals:none  ST&T Change: LVH  Narrative Interpretation: abnormal but stable             MDM   Will check for end organ damage with EKG, chest xray and renal function        Arman Filter, NP 12/21/12 [phone]

## 2012-12-21 NOTE — ED Provider Notes (Signed)
Medical screening examination/treatment/procedure(s) were performed by non-physician practitioner and as supervising physician I was immediately available for consultation/collaboration.  Virgle Arth M Jonae Renshaw, MD 12/21/12 0738 

## 2013-04-09 ENCOUNTER — Telehealth: Payer: Self-pay | Admitting: General Practice

## 2013-04-09 NOTE — Telephone Encounter (Signed)
When pt saw Dr Thedore Mins she was told to take 1 tablet per day.  Script reads "labetalol (NORMODYNE) 200 MG tablet Sig - Route: Take 200 mg by mouth 3 (three) times daily."  Please call pt back to confirm correct dosage.

## 2013-04-09 NOTE — Telephone Encounter (Signed)
There is no indication in this chart that dr singh saw  This patient or prescribed this medication Please advise patient she will need to have an appointment to discuss her medications

## 2013-04-24 ENCOUNTER — Ambulatory Visit: Payer: Self-pay

## 2013-05-01 ENCOUNTER — Ambulatory Visit: Payer: No Typology Code available for payment source | Attending: Family Medicine | Admitting: Family Medicine

## 2013-05-01 ENCOUNTER — Encounter (HOSPITAL_COMMUNITY): Payer: Self-pay | Admitting: Nurse Practitioner

## 2013-05-01 ENCOUNTER — Inpatient Hospital Stay (HOSPITAL_COMMUNITY)
Admission: EM | Admit: 2013-05-01 | Discharge: 2013-05-02 | DRG: 310 | Disposition: A | Discharge status: Home / Self Care | Payer: No Typology Code available for payment source | Attending: Internal Medicine | Admitting: Internal Medicine

## 2013-05-01 VITALS — BP 162/89 | HR 127 | Temp 98.6°F | Ht 66.0 in | Wt 192.0 lb

## 2013-05-01 DIAGNOSIS — I4891 Unspecified atrial fibrillation: Secondary | ICD-10-CM | POA: Insufficient documentation

## 2013-05-01 DIAGNOSIS — Q244 Congenital subaortic stenosis: Secondary | ICD-10-CM

## 2013-05-01 DIAGNOSIS — F418 Other specified anxiety disorders: Secondary | ICD-10-CM | POA: Insufficient documentation

## 2013-05-01 DIAGNOSIS — I10 Essential (primary) hypertension: Secondary | ICD-10-CM

## 2013-05-01 DIAGNOSIS — R002 Palpitations: Secondary | ICD-10-CM | POA: Diagnosis present

## 2013-05-01 DIAGNOSIS — I5189 Other ill-defined heart diseases: Secondary | ICD-10-CM

## 2013-05-01 DIAGNOSIS — F341 Dysthymic disorder: Secondary | ICD-10-CM

## 2013-05-01 DIAGNOSIS — Z79899 Other long term (current) drug therapy: Secondary | ICD-10-CM

## 2013-05-01 DIAGNOSIS — R739 Hyperglycemia, unspecified: Secondary | ICD-10-CM

## 2013-05-01 DIAGNOSIS — E611 Iron deficiency: Secondary | ICD-10-CM | POA: Insufficient documentation

## 2013-05-01 DIAGNOSIS — F1010 Alcohol abuse, uncomplicated: Secondary | ICD-10-CM

## 2013-05-01 DIAGNOSIS — K219 Gastro-esophageal reflux disease without esophagitis: Secondary | ICD-10-CM | POA: Diagnosis present

## 2013-05-01 DIAGNOSIS — D509 Iron deficiency anemia, unspecified: Secondary | ICD-10-CM | POA: Diagnosis present

## 2013-05-01 DIAGNOSIS — R7309 Other abnormal glucose: Secondary | ICD-10-CM | POA: Diagnosis present

## 2013-05-01 DIAGNOSIS — I480 Paroxysmal atrial fibrillation: Secondary | ICD-10-CM

## 2013-05-01 DIAGNOSIS — F1210 Cannabis abuse, uncomplicated: Secondary | ICD-10-CM

## 2013-05-01 HISTORY — DX: Paroxysmal atrial fibrillation: I48.0

## 2013-05-01 HISTORY — DX: Cardiac arrhythmia, unspecified: I49.9

## 2013-05-01 HISTORY — DX: Iron deficiency anemia, unspecified: D50.9

## 2013-05-01 HISTORY — DX: Gastro-esophageal reflux disease without esophagitis: K21.9

## 2013-05-01 HISTORY — DX: Other psychoactive substance abuse, in remission: F19.11

## 2013-05-01 HISTORY — DX: Other chest pain: R07.89

## 2013-05-01 HISTORY — DX: Essential (primary) hypertension: I10

## 2013-05-01 HISTORY — DX: Congenital subaortic stenosis: Q24.4

## 2013-05-01 LAB — COMPREHENSIVE METABOLIC PANEL
ALT: 24 U/L (ref 0–35)
AST: 23 U/L (ref 0–37)
Albumin: 4.2 g/dL (ref 3.5–5.2)
CO2: 22 mEq/L (ref 19–32)
Chloride: 103 mEq/L (ref 96–112)
Creatinine, Ser: 0.45 mg/dL — ABNORMAL LOW (ref 0.50–1.10)
GFR calc non Af Amer: >90 mL/min (ref >90)
Potassium: 3.7 mEq/L (ref 3.5–5.1)
Sodium: 141 mEq/L (ref 135–145)
Total Bilirubin: 0.3 mg/dL (ref 0.3–1.2)

## 2013-05-01 LAB — CBC WITH DIFFERENTIAL/PLATELET
Basophils Absolute: 0 10*3/uL (ref 0.0–0.1)
Basophils Relative: 0 % (ref 0–1)
HCT: 39.4 % (ref 36.0–46.0)
Lymphocytes Relative: 15 % (ref 12–46)
MCHC: 35.3 g/dL (ref 30.0–36.0)
Neutro Abs: 5.2 10*3/uL (ref 1.7–7.7)
Neutrophils Relative %: 80 % — ABNORMAL HIGH (ref 43–77)
Platelets: 171 10*3/uL (ref 150–400)
RDW: 12.2 % (ref 11.5–15.5)
WBC: 6.4 10*3/uL (ref 4.0–10.5)

## 2013-05-01 LAB — MRSA PCR SCREENING: MRSA by PCR: NEGATIVE

## 2013-05-01 LAB — T4, FREE: Free T4: 1.13 ng/dL (ref 0.80–1.80)

## 2013-05-01 LAB — PRO B NATRIURETIC PEPTIDE: Pro B Natriuretic peptide (BNP): 283.8 pg/mL — ABNORMAL HIGH (ref 0–125)

## 2013-05-01 LAB — PROTIME-INR: INR: 0.94 (ref 0.00–1.49)

## 2013-05-01 LAB — TSH: TSH: 1.178 u[IU]/mL (ref 0.350–4.500)

## 2013-05-01 MED ORDER — NITROGLYCERIN 0.4 MG SL SUBL: 0.4000 mg | SUBLINGUAL_TABLET | SUBLINGUAL | Status: DC | PRN

## 2013-05-01 MED ORDER — FOLIC ACID 1 MG PO TABS
1.0000 mg | ORAL_TABLET | Freq: Every day | ORAL | Status: DC
  Administered 2013-05-02: 1 mg via ORAL
  Filled 2013-05-01 (×2): qty 1

## 2013-05-01 MED ORDER — POTASSIUM CHLORIDE CRYS ER 10 MEQ PO TBCR: 10.0000 meq | EXTENDED_RELEASE_TABLET | Freq: Every day | ORAL | Status: DC

## 2013-05-01 MED ORDER — AMLODIPINE BESYLATE 10 MG PO TABS: 10.0000 mg | ORAL_TABLET | Freq: Every day | ORAL | Status: DC

## 2013-05-01 MED ORDER — METOPROLOL SUCCINATE ER 25 MG PO TB24: 25.0000 mg | ORAL_TABLET | Freq: Every day | ORAL | Status: DC

## 2013-05-01 MED ORDER — FERROUS SULFATE 325 (65 FE) MG PO TABS
325.0000 mg | ORAL_TABLET | Freq: Every day | ORAL | Status: DC
  Administered 2013-05-02: 325 mg via ORAL
  Filled 2013-05-01 (×2): qty 1

## 2013-05-01 MED ORDER — ONDANSETRON HCL 4 MG/2ML IJ SOLN: 4.0000 mg | Freq: Four times a day (QID) | INTRAMUSCULAR | Status: DC | PRN

## 2013-05-01 MED ORDER — SODIUM CHLORIDE 0.9 % IJ SOLN: 3.0000 mL | INTRAMUSCULAR | Status: DC | PRN

## 2013-05-01 MED ORDER — THIAMINE HCL 100 MG/ML IJ SOLN
100.0000 mg | Freq: Every day | INTRAMUSCULAR | Status: DC
  Filled 2013-05-01 (×2): qty 1

## 2013-05-01 MED ORDER — METOPROLOL TARTRATE 1 MG/ML IV SOLN
5.0000 mg | Freq: Once | INTRAVENOUS | Status: AC
  Administered 2013-05-01: 5 mg via INTRAVENOUS
  Filled 2013-05-01: qty 5

## 2013-05-01 MED ORDER — ADULT MULTIVITAMIN W/MINERALS CH
1.0000 | ORAL_TABLET | Freq: Every day | ORAL | Status: DC
  Administered 2013-05-02: 1 via ORAL
  Filled 2013-05-01 (×2): qty 1

## 2013-05-01 MED ORDER — LORAZEPAM 2 MG/ML IJ SOLN: 1.0000 mg | Freq: Four times a day (QID) | INTRAMUSCULAR | Status: DC | PRN

## 2013-05-01 MED ORDER — FUROSEMIDE 20 MG PO TABS: 20.0000 mg | ORAL_TABLET | Freq: Every day | ORAL | Status: DC

## 2013-05-01 MED ORDER — DILTIAZEM HCL 100 MG IV SOLR
5.0000 mg/h | INTRAVENOUS | Status: DC
  Administered 2013-05-01: 5 mg/h via INTRAVENOUS
  Administered 2013-05-01: 15 mg/h via INTRAVENOUS
  Administered 2013-05-02: 10 mg/h via INTRAVENOUS
  Administered 2013-05-02: 15 mg/h via INTRAVENOUS

## 2013-05-01 MED ORDER — SERTRALINE HCL 50 MG PO TABS
50.0000 mg | ORAL_TABLET | Freq: Every day | ORAL | Status: DC
  Administered 2013-05-01 – 2013-05-02 (×2): 50 mg via ORAL
  Filled 2013-05-01 (×2): qty 1

## 2013-05-01 MED ORDER — ACETAMINOPHEN 325 MG PO TABS: 650.0000 mg | ORAL_TABLET | ORAL | Status: DC | PRN

## 2013-05-01 MED ORDER — ZOLPIDEM TARTRATE 5 MG PO TABS: 5.0000 mg | ORAL_TABLET | Freq: Every evening | ORAL | Status: DC | PRN

## 2013-05-01 MED ORDER — SERTRALINE HCL 100 MG PO TABS: ORAL_TABLET | ORAL | Status: DC

## 2013-05-01 MED ORDER — SODIUM CHLORIDE 0.9 % IV SOLN
250.0000 mL | INTRAVENOUS | Status: DC | PRN
  Administered 2013-05-01: 1000 mL via INTRAVENOUS

## 2013-05-01 MED ORDER — LORAZEPAM 1 MG PO TABS
1.0000 mg | ORAL_TABLET | Freq: Four times a day (QID) | ORAL | Status: DC | PRN
  Administered 2013-05-01: 1 mg via ORAL
  Filled 2013-05-01: qty 1

## 2013-05-01 MED ORDER — DILTIAZEM HCL 25 MG/5ML IV SOLN
10.0000 mg | Freq: Once | INTRAVENOUS | Status: AC
  Administered 2013-05-01: 10 mg via INTRAVENOUS

## 2013-05-01 MED ORDER — RIVAROXABAN 20 MG PO TABS
20.0000 mg | ORAL_TABLET | Freq: Every day | ORAL | Status: DC
  Administered 2013-05-01: 20 mg via ORAL
  Filled 2013-05-01 (×2): qty 1

## 2013-05-01 MED ORDER — OMEGA-3 FATTY ACIDS 1000 MG PO CAPS: 1.0000 g | ORAL_CAPSULE | Freq: Every day | ORAL | Status: DC

## 2013-05-01 MED ORDER — OMEGA-3-ACID ETHYL ESTERS 1 G PO CAPS
1.0000 g | ORAL_CAPSULE | Freq: Every day | ORAL | Status: DC
  Administered 2013-05-02: 1 g via ORAL
  Filled 2013-05-01 (×2): qty 1

## 2013-05-01 MED ORDER — VITAMIN B-1 100 MG PO TABS
100.0000 mg | ORAL_TABLET | Freq: Every day | ORAL | Status: DC
  Administered 2013-05-01 – 2013-05-02 (×2): 100 mg via ORAL
  Filled 2013-05-01 (×2): qty 1

## 2013-05-01 MED ORDER — SODIUM CHLORIDE 0.9 % IJ SOLN: 3.0000 mL | Freq: Two times a day (BID) | INTRAMUSCULAR | Status: DC

## 2013-05-01 MED ORDER — METOPROLOL TARTRATE 25 MG PO TABS
25.0000 mg | ORAL_TABLET | Freq: Two times a day (BID) | ORAL | Status: DC
  Administered 2013-05-01 – 2013-05-02 (×2): 25 mg via ORAL
  Filled 2013-05-01 (×3): qty 1

## 2013-05-01 NOTE — H&P (Signed)
Patient ID: Jane Arellano MRN: [id number], DOB/AGE: 53-Jul-1962   Admit date: 05/01/2013  Primary Physician: Standley Dakins, MD Primary Cardiologist: D. Bensimhon, MD   Pt. Profile:  52 y/o female with h/o subaortic membrane who presented to the ED today with afib w/ rvr.  Problem List  Past Medical History  Diagnosis Date  . Atypical chest pain     a. 11/2005 Negative Myovie  . Subaortic membrane     a. 01/2010 Echo: EF 55-60%, No rwma, subaortic membrane with elevated LVOT mean gradient of 21 mmHg, Triv AI, mod dil LA, mildly increased PASP.  Marland Kitchen GERD (gastroesophageal reflux disease)   . HTN (hypertension)   . Iron deficiency anemia   . History of substance abuse     No past surgical history on file.   Allergies  Allergies  Allergen Reactions  . Ketorolac Tromethamine Other (See Comments)    Unknown reaction   HPI  52 year old female with the above problem list. She has a history of systolic murmur and subaortic membrane. She was last seen in cardiology clinic in 2011. Her last echocardiogram showed normal LV function with an elevated LVOT mean gradient of 21 mm mercury. Recommendation was made that time for followup echo in 2 years. Unfortunately, she has not followed up with any of her doctors and and a running out of her antihypertensive medication a year or more ago. In January of this year, she was seen in the ED for cough and she was markedly hypertensive. Heart rate was elevated however she was in a sinus rhythm. Her amlodipine was refilled and she was advised to followup with primary care however due to lack of insurance, she did not immediately do that. She's been busy caring for her mother who died approximately 2 months ago. She continues to drink about 3 - 40 ounce beers daily. She smokes marijuana weekly but hasn't used cocaine in 9 years. She generally gets around well without expressing chest pain or dyspnea however occasionally notes tachypalpitations. She only  recently gained her orange card and set up primary care followup for today. She was seen in the office, she was noted to be tachycardic an ECG was performed showing rapid atrial fibrillation. She was transferred to the ED via EMS for cardiology evaluation. She currently denies chest pain, PND, orthopnea, dyspnea, palpitations, presyncope, or syncope.  Home Medications  Prior to Admission medications   Medication Sig Start Date End Date Taking? Authorizing Provider  amLODipine (NORVASC) 10 MG tablet Take 10 mg by mouth daily.   Yes Historical Provider, MD  Calcium Carb-Cholecalciferol (CALCIUM-VITAMIN D3) 500-400 MG-UNIT TABS Take 1 tablet by mouth daily.   Yes Historical Provider, MD  ferrous sulfate 325 (65 FE) MG tablet Take 325 mg by mouth daily with breakfast.   Yes Historical Provider, MD  fish oil-omega-3 fatty acids 1000 MG capsule Take 1 g by mouth daily.   Yes Historical Provider, MD  furosemide (LASIX) 20 MG tablet Take 1 tablet (20 mg total) by mouth daily. 05/01/13  Yes Clanford Cyndie Mull, MD  metoprolol succinate (TOPROL-XL) 25 MG 24 hr tablet Take 1 tablet (25 mg total) by mouth daily. 05/01/13  Yes Clanford Cyndie Mull, MD  Multiple Vitamins-Minerals (MULTIVITAMIN PO) Take 1 tablet by mouth daily.   Yes Historical Provider, MD  potassium chloride (K-DUR,KLOR-CON) 10 MEQ tablet Take 1 tablet (10 mEq total) by mouth daily. 05/01/13  Yes Clanford Cyndie Mull, MD  sertraline (ZOLOFT) 100 MG tablet Take 0.5 tab po  daily for 1 week, then take 1 po daily 05/01/13  Yes Clanford Cyndie Mull, MD   Family History  Family History  Problem Relation Age of Onset  . Hypertension Sister     alive and well  . Diabetes Father     deceased  . Cancer Mother     deceased @ 41  . Stroke Mother   . Hypertension Sister     alive and well  . Heart attack Brother     deceased @ 63   Social History  History   Social History  . Marital Status: Single    Spouse Name: N/A    Number of Children: N/A  .  Years of Education: N/A   Occupational History  . Not on file.   Social History Main Topics  . Smoking status: Never Smoker   . Smokeless tobacco: Not on file  . Alcohol Use: Yes     Comment: Drinks 3 - 40 oz beers daily.  . Drug Use: Yes    Special: Marijuana     Comment: Smokes marijuana weekly.  Has not used cocaine in 9 years.  . Sexually Active: Not on file   Other Topics Concern  . Not on file   Social History Narrative   Lives in [place] by herself.  She had been caring for her mother but she died 2 mos ago.  She tries to remain active but does not regularly exercise.     Review of Systems General:  No chills, fever, night sweats or weight changes.  Cardiovascular:  Occasional tachypalpitations.  No chest pain, dyspnea on exertion, edema, orthopnea, paroxysmal nocturnal dyspnea. Dermatological: No rash, lesions/masses Respiratory: No cough, dyspnea Urologic: No hematuria, dysuria Abdominal:   No nausea, vomiting, diarrhea, bright red blood per rectum, melena, or hematemesis Neurologic:  No visual changes, wkns, changes in mental status. All other systems reviewed and are otherwise negative except as noted above.  Physical Exam  Blood pressure 117/85, pulse 144, temperature 98.9 F (37.2 C), resp. rate 20, SpO2 98.00%.  General: Pleasant, NAD Psych: Normal affect. Neuro: Alert and oriented X 3. Moves all extremities spontaneously. HEENT: Normal  Neck: Supple without bruits or JVD. Lungs:  Resp regular and unlabored, CTA. Heart: IR, IR, tachy, 3/6 sem throughout, loudest @ bilat usb's. Abdomen: Soft, non-tender, non-distended, BS + x 4.  Extremities: No clubbing, cyanosis or edema. DP/PT/Radials 2+ and equal bilaterally.  Labs  Trop i, poc: 0.02. pBNP 16109  Lab Results  Component Value Date   WBC 6.4 05/01/2013   HGB 13.9 05/01/2013   HCT 39.4 05/01/2013   MCV 94.3 05/01/2013   PLT 171 05/01/2013     Recent Labs Lab 05/01/13 1309  NA 141  K 3.7  CL 103    CO2 22  BUN 8  CREATININE 0.45*  CALCIUM 10.0  PROT 8.7*  BILITOT 0.3  ALKPHOS 64  ALT 24  AST 23  GLUCOSE 200*   Lab Results  Component Value Date   CHOL 144 12/08/2009   HDL 52 12/08/2009   LDLCALC 62 12/08/2009   TRIG 149 12/08/2009   Radiology/Studies  No results found.  ECG  Afib, 160, inflat st depression.  ASSESSMENT AND PLAN  1.  Atrial fibrillation with rapid ventricular response: This is a new diagnosis for her. Duration of atrial fibrillation is unknown as she is mostly asymptomatic. She's currently on IV diltiazem at 15 mg per hour with rates remaining in the 140s to 160s. Her CHA2DS2VASc =  2, for hypertension and gender.  With her h/o etoh abuse and noncompliance/loss to f/u, she is not a strong coumadin candidate.  We will add xarelto 20mg  daily starting today.  We will have to work with case mgmt to see if her orange card will cover a NOAC.  If not, we will have to explore options through samples in our office along with the Xarelto assistance program.  Cont IV dilt and give IV lopressor here in ED and add oral bb upon admission.  If she is unable to be adequately rate-controlled in this way, we will add IV amio for rate control with a plan for TEE and DCCV after 3 doses of Xarelto.  Check Mg/TSH.  2.  Subaortic membrane:  Last echo in 2011 showed normal LV function with elevated LVOT mean gradient of .  Repeat echo once rate slower or back in sinus.  She has not h/o doe, pnd, orthopnea, syncope, or edema.    3.  HTN:  Currently stable.  She says that she has been compliant with meds since January ER visit (amlodipine & KDur).  4.  ETOH Abuse:  Drinks 3 - 40 oz beers/day.  She realizes that she needs to quit.  Add CIWA protocol.  5.  Marijuana Abuse:  Cessation advised.  Signed, Nicolasa Ducking, NP 05/01/2013, 2:36 PM   Patient seen and examined independently. Gilford Raid, NP note reviewed carefully - agree with his assessment and plan. I have edited  the note based on my findings.   Patient well known to me from previous follow-up for subaortic membrane. Now presents with new diagnosis of AF with RVR of unknown duration. This is totally asx despite very high rates. She is on diltiazem gtt and rate still 140-160. Agree with admit to SDU Will start b-blocker and Xarelto. Check 2-d echo, electrolytes and TSH. Suspect she will need TEE cardioversion as well and probable anti-arrhythmic therapy. Will also need overnight oximetry. Not coumadin candidate due to poor f/u so we will see if we can get her Xarelto on her orange card.   Daniel Bensimhon,MD 3:14 PM

## 2013-05-01 NOTE — ED Provider Notes (Signed)
History     CSN: [id number]  Arrival date & time 05/01/13  1135   First MD Initiated Contact with Patient 05/01/13 1135      Chief Complaint  Patient presents with  . Atrial Fibrillation    (Consider location/radiation/quality/duration/timing/severity/associated sxs/prior treatment) HPI  Jane Arellano is a 52 y.o.female presenting to the ER transferred by EMS from [facility] cardiology for new onset atrial fibrillation with rapid ventricular response. It was found incidentally while getting a routine check for her heart murmur. She admits that she can sometimes tell that her heart beats fast but does not know when this episode started. She denies any symptoms, no SOB, CP, weakness, confusion, wheezing, nausea, diaphoresis, fever, chills, recent weight loss.    Past Medical History  Diagnosis Date  . Atypical chest pain     a. 11/2005 Negative Myovie  . Subaortic membrane     a. 01/2010 Echo: EF 55-60%, No rwma, subaortic membrane with elevated LVOT mean gradient of 21 mmHg, Triv AI, mod dil LA, mildly increased PASP.  Marland Kitchen GERD (gastroesophageal reflux disease)   . HTN (hypertension)   . Iron deficiency anemia   . History of substance abuse     No past surgical history on file.  Family History  Problem Relation Age of Onset  . Hypertension Sister     alive and well  . Diabetes Father     deceased  . Cancer Mother     deceased @ 69  . Stroke Mother   . Hypertension Sister     alive and well  . Heart attack Brother     deceased @ 13    History  Substance Use Topics  . Smoking status: Never Smoker   . Smokeless tobacco: Not on file  . Alcohol Use: Yes     Comment: Drinks 3 - 40 oz beers daily.    OB History   Grav Para Term Preterm Abortions TAB SAB Ect Mult Living                  Review of Systems  Cardiovascular: Positive for palpitations.  All other systems reviewed and are negative.    Allergies  Ketorolac tromethamine  Home Medications   Current  Outpatient Rx  Name  Route  Sig  Dispense  Refill  . amLODipine (NORVASC) 10 MG tablet   Oral   Take 10 mg by mouth daily.         . Calcium Carb-Cholecalciferol (CALCIUM-VITAMIN D3) 500-400 MG-UNIT TABS   Oral   Take 1 tablet by mouth daily.         . ferrous sulfate 325 (65 FE) MG tablet   Oral   Take 325 mg by mouth daily with breakfast.         . fish oil-omega-3 fatty acids 1000 MG capsule   Oral   Take 1 g by mouth daily.         . furosemide (LASIX) 20 MG tablet   Oral   Take 1 tablet (20 mg total) by mouth daily.   30 tablet   3   . metoprolol succinate (TOPROL-XL) 25 MG 24 hr tablet   Oral   Take 1 tablet (25 mg total) by mouth daily.   30 tablet   3   . Multiple Vitamins-Minerals (MULTIVITAMIN PO)   Oral   Take 1 tablet by mouth daily.         . potassium chloride (K-DUR,KLOR-CON) 10 MEQ tablet  Oral   Take 1 tablet (10 mEq total) by mouth daily.   30 tablet   3   . sertraline (ZOLOFT) 100 MG tablet      Take 0.5 tab po daily for 1 week, then take 1 po daily   30 tablet   3     BP 117/85  Pulse 144  Temp(Src) 98.9 F (37.2 C)  Resp 20  SpO2 98%  Physical Exam  Nursing note and vitals reviewed. Constitutional: She appears well-developed and well-nourished. No distress.  HENT:  Head: Normocephalic and atraumatic.  Eyes: Pupils are equal, round, and reactive to light.  Neck: Normal range of motion. Neck supple.  Cardiovascular: An irregularly irregular rhythm present. Tachycardia present.   Pulmonary/Chest: Effort normal.  Abdominal: Soft.  Neurological: She is alert.  Skin: Skin is warm and dry.    ED Course  Procedures (including critical care time)  Labs Reviewed  CBC WITH DIFFERENTIAL - Abnormal; Notable for the following:    Neutrophils Relative % 80 (*)    All other components within normal limits  COMPREHENSIVE METABOLIC PANEL - Abnormal; Notable for the following:    Glucose, Bld 200 (*)    Creatinine, Ser 0.45  (*)    Total Protein 8.7 (*)    All other components within normal limits  PRO B NATRIURETIC PEPTIDE - Abnormal; Notable for the following:    Pro B Natriuretic peptide (BNP) 283.8 (*)    All other components within normal limits  PROTIME-INR  POCT I-STAT TROPONIN I   No results found.   1. Atrial fibrillation with rapid ventricular response       MDM   Date: 05/01/2013  Rate: 175  Rhythm: atrial fibrillation  QRS Axis: normal  Intervals: normal  ST/T Wave abnormalities: normal  Conduction Disutrbances:a fib with rapid ventricular response  Narrative Interpretation:   Old EKG Reviewed: none available  Discussed case with Dr. Blinda Leatherwood, Cardizem bolus and drip ordered as well as labs.   I spoke with [facility], unable to rate control patient therefore will need cardiology assistance. Patient remains stable and asymptomatic.  Most likely [place] to admit.        Dorthula Matas, PA-C 05/01/13 1446

## 2013-05-01 NOTE — Patient Instructions (Addendum)
Anxiety and Panic Attacks Your caregiver has informed you that you are having an anxiety or panic attack. There may be many forms of this. Most of the time these attacks come suddenly and without warning. They come at any time of day, including periods of sleep, and at any time of life. They may be strong and unexplained. Although panic attacks are very scary, they are physically harmless. Sometimes the cause of your anxiety is not known. Anxiety is a protective mechanism of the body in its fight or flight mechanism. Most of these perceived danger situations are actually nonphysical situations (such as anxiety over losing a job). CAUSES  The causes of an anxiety or panic attack are many. Panic attacks may occur in otherwise healthy people given a certain set of circumstances. There may be a genetic cause for panic attacks. Some medications may also have anxiety as a side effect. SYMPTOMS  Some of the most common feelings are:  Intense terror.  Dizziness, feeling faint.  Hot and cold flashes.  Fear of going crazy.  Feelings that nothing is real.  Sweating.  Shaking.  Chest pain or a fast heartbeat (palpitations).  Smothering, choking sensations.  Feelings of impending doom and that death is near.  Tingling of extremities, this may be from over-breathing.  Altered reality (derealization).  Being detached from yourself (depersonalization). Several symptoms can be present to make up anxiety or panic attacks. DIAGNOSIS  The evaluation by your caregiver will depend on the type of symptoms you are experiencing. The diagnosis of anxiety or panic attack is made when no physical illness can be determined to be a cause of the symptoms. TREATMENT  Treatment to prevent anxiety and panic attacks may include:  Avoidance of circumstances that cause anxiety.  Reassurance and relaxation.  Regular exercise.  Relaxation therapies, such as yoga.  Psychotherapy with a psychiatrist or  therapist.  Avoidance of caffeine, alcohol and illegal drugs.  Prescribed medication. SEEK IMMEDIATE MEDICAL CARE IF:   You experience panic attack symptoms that are different than your usual symptoms.  You have any worsening or concerning symptoms. Document Released: 11/07/2005 Document Revised: 01/30/2012 Document Reviewed: 03/11/2010 [facility] Patient Information 2014 [place], [place]. DASH Diet The DASH diet stands for "Dietary Approaches to Stop Hypertension." It is a healthy eating plan that has been shown to reduce high blood pressure (hypertension) in as little as 14 days, while also possibly providing other significant health benefits. These other health benefits include reducing the risk of breast cancer after menopause and reducing the risk of type 2 diabetes, heart disease, colon cancer, and stroke. Health benefits also include weight loss and slowing kidney failure in patients with chronic kidney disease.  DIET GUIDELINES  Limit salt (sodium). Your diet should contain less than 1500 mg of sodium daily.  Limit refined or processed carbohydrates. Your diet should include mostly whole grains. Desserts and added sugars should be used sparingly.  Include small amounts of heart-healthy fats. These types of fats include nuts, oils, and tub margarine. Limit saturated and trans fats. These fats have been shown to be harmful in the body. CHOOSING FOODS  The following food groups are based on a 2000 calorie diet. See your Registered Dietitian for individual calorie needs. Grains and Grain Products (6 to 8 servings daily)  Eat More Often: Whole-wheat bread, brown rice, whole-grain or wheat pasta, quinoa, popcorn without added fat or salt (air popped).  Eat Less Often: White bread, white pasta, white rice, cornbread. Vegetables (4 to 5 servings  daily)  Eat More Often: Fresh, frozen, and canned vegetables. Vegetables may be raw, steamed, roasted, or grilled with a minimal amount of  fat.  Eat Less Often/Avoid: Creamed or fried vegetables. Vegetables in a cheese sauce. Fruit (4 to 5 servings daily)  Eat More Often: All fresh, canned (in natural juice), or frozen fruits. Dried fruits without added sugar. One hundred percent fruit juice ( cup [237 mL] daily).  Eat Less Often: Dried fruits with added sugar. Canned fruit in light or heavy syrup. [employer], Fish, and Poultry (2 servings or less daily. One serving is 3 to 4 oz [85-114 g]).  Eat More Often: Ninety percent or leaner ground beef, tenderloin, sirloin. Round cuts of beef, chicken breast, [place] breast. All fish. Grill, bake, or broil your meat. Nothing should be fried.  Eat Less Often/Avoid: Fatty cuts of meat, [place], or chicken leg, thigh, or wing. Fried cuts of meat or fish. Dairy (2 to 3 servings)  Eat More Often: Low-fat or fat-free milk, low-fat plain or light yogurt, reduced-fat or part-skim cheese.  Eat Less Often/Avoid: Milk (whole, 2%).Whole milk yogurt. Full-fat cheeses. Nuts, Seeds, and Legumes (4 to 5 servings per week)  Eat More Often: All without added salt.  Eat Less Often/Avoid: Salted nuts and seeds, canned beans with added salt. Fats and Sweets (limited)  Eat More Often: Vegetable oils, tub margarines without trans fats, sugar-free gelatin. Mayonnaise and salad dressings.  Eat Less Often/Avoid: Coconut oils, palm oils, butter, stick margarine, cream, half and half, cookies, candy, pie. FOR MORE INFORMATION The Dash Diet Eating Plan: www.dashdiet.org Document Released: 10/27/2011 Document Revised: 01/30/2012 Document Reviewed: 10/27/2011 [facility] Patient Information 2014 [place], [place]. Hypertension As your heart beats, it forces blood through your arteries. This force is your blood pressure. If the pressure is too high, it is called hypertension (HTN) or high blood pressure. HTN is dangerous because you may have it and not know it. High blood pressure may mean that your heart has to  work harder to pump blood. Your arteries may be narrow or stiff. The extra work puts you at risk for heart disease, stroke, and other problems.  Blood pressure consists of two numbers, a higher number over a lower, 110/72, for example. It is stated as "110 over 72." The ideal is below 120 for the top number (systolic) and under 80 for the bottom (diastolic). Write down your blood pressure today. You should pay close attention to your blood pressure if you have certain conditions such as:  Heart failure.  Prior heart attack.  Diabetes  Chronic kidney disease.  Prior stroke.  Multiple risk factors for heart disease. To see if you have HTN, your blood pressure should be measured while you are seated with your arm held at the level of the heart. It should be measured at least twice. A one-time elevated blood pressure reading (especially in the Emergency Department) does not mean that you need treatment. There may be conditions in which the blood pressure is different between your right and left arms. It is important to see your caregiver soon for a recheck. Most people have essential hypertension which means that there is not a specific cause. This type of high blood pressure may be lowered by changing lifestyle factors such as:  Stress.  Smoking.  Lack of exercise.  Excessive weight.  Drug/tobacco/alcohol use.  Eating less salt. Most people do not have symptoms from high blood pressure until it has caused damage to the body. Effective treatment can  often prevent, delay or reduce that damage. TREATMENT  When a cause has been identified, treatment for high blood pressure is directed at the cause. There are a large number of medications to treat HTN. These fall into several categories, and your caregiver will help you select the medicines that are best for you. Medications may have side effects. You should review side effects with your caregiver. If your blood pressure stays high after you  have made lifestyle changes or started on medicines,   Your medication(s) may need to be changed.  Other problems may need to be addressed.  Be certain you understand your prescriptions, and know how and when to take your medicine.  Be sure to follow up with your caregiver within the time frame advised (usually within two weeks) to have your blood pressure rechecked and to review your medications.  If you are taking more than one medicine to lower your blood pressure, make sure you know how and at what times they should be taken. Taking two medicines at the same time can result in blood pressure that is too low. SEEK IMMEDIATE MEDICAL CARE IF:  You develop a severe headache, blurred or changing vision, or confusion.  You have unusual weakness or numbness, or a faint feeling.  You have severe chest or abdominal pain, vomiting, or breathing problems. MAKE SURE YOU:   Understand these instructions.  Will watch your condition.  Will get help right away if you are not doing well or get worse. Document Released: 11/07/2005 Document Revised: 01/30/2012 Document Reviewed: 06/27/2008 [facility] Patient Information 2014 [place], [place].

## 2013-05-01 NOTE — ED Notes (Signed)
Went to recheck  vitalsigns  Seen pt. heartrate was150 to 161  Did another ekg  Shown to dr.pollina

## 2013-05-01 NOTE — Progress Notes (Signed)
Patient ID: Jane Arellano, female   DOB: August 09, 1961, 52 y.o.   MRN: [id number]  CC: establish care   HPI: Pt presenting to get established again for she has not had a PCP in almost 1 year.  She is  Off her regular bP meds. She was seen in the ED several months ago.  She says that they refilled her amlodipine but she has been without her beta blocker for a very long time.  She denies chest pain.  She has occasional palpitations.  She has a history of a heart valve abnormality and had been seen by cardiology and was supposed to see them in 2013 but was not able to do so because of not having medical insurance.    Allergies  Allergen Reactions  . Ketorolac Tromethamine Other (See Comments)    Unknown reaction   Past Medical History  Diagnosis Date  . Hypertension    Current Outpatient Prescriptions on File Prior to Visit  Medication Sig Dispense Refill  . amLODipine (NORVASC) 10 MG tablet Take 1 tablet (10 mg total) by mouth daily.  30 tablet  2  . dextromethorphan-guaiFENesin (MUCINEX DM) 30-600 MG per 12 hr tablet Take 1 tablet by mouth every 12 (twelve) hours as needed. For cough      . guaiFENesin (ROBITUSSIN) 100 MG/5ML liquid Take 200 mg by mouth 4 (four) times daily as needed. For cough      . potassium chloride SA (K-DUR,KLOR-CON) 20 MEQ tablet Take 1 tablet (20 mEq total) by mouth daily.  10 tablet  0   No current facility-administered medications on file prior to visit.   Family History  Problem Relation Age of Onset  . Hypertension Other   . Diabetes Other   . Cancer Other    History   Social History  . Marital Status: Single    Spouse Name: N/A    Number of Children: N/A  . Years of Education: N/A   Occupational History  . Not on file.   Social History Main Topics  . Smoking status: Never Smoker   . Smokeless tobacco: Not on file  . Alcohol Use: Yes  . Drug Use: Yes    Special: Marijuana  . Sexually Active:    Other Topics Concern  . Not on file   Social  History Narrative  . No narrative on file    Review of Systems  Constitutional: Negative for fever, chills, diaphoresis, activity change, appetite change and fatigue.  HENT: Negative for ear pain, nosebleeds, congestion, facial swelling, rhinorrhea, neck pain, neck stiffness and ear discharge.   Eyes: Negative for pain, discharge, redness, itching and visual disturbance.  Respiratory: occ sob with exertion, stress    Cardiovascular: palpitations  Gastrointestinal: Negative for abdominal distention.  Genitourinary: Negative for dysuria, urgency, frequency, hematuria, flank pain, decreased urine volume, difficulty urinating and dyspareunia.  Musculoskeletal: Negative for back pain, joint swelling, arthralgias and gait problem.  Neurological: Negative for dizziness, tremors, seizures, syncope, facial asymmetry, speech difficulty, weakness, light-headedness, numbness and headaches.  Hematological: Negative for adenopathy. Does not bruise/bleed easily.  Psychiatric/Behavioral: Negative for hallucinations, behavioral problems, confusion, dysphoric mood, decreased concentration and agitation.    Objective:   Filed Vitals:   05/01/13 0949  BP: 162/89  Pulse: 127  Temp: 98.6 F (37 C)    Physical Exam  Constitutional: Appears well-developed and well-nourished. No distress.  HENT: Normocephalic. External right and left ear normal. Oropharynx is clear and moist.  Eyes: Conjunctivae and EOM are normal.  PERRLA, no scleral icterus.  Neck: Normal ROM. Neck supple. No JVD. No tracheal deviation. No thyromegaly.  CVS: loud tachycardic holosystolic murmur   Pulmonary: Effort and breath sounds normal, no stridor, rhonchi, wheezes, rales.  Abdominal: Soft. BS +,  no distension, tenderness, rebound or guarding.  Musculoskeletal: Normal range of motion. No edema and no tenderness.  Lymphadenopathy: No lymphadenopathy noted, cervical, inguinal. Neuro: Alert. Normal reflexes, muscle tone coordination.  No cranial nerve deficit. Skin: Skin is warm and dry. No rash noted. Not diaphoretic. No erythema. No pallor.  Psychiatric: Normal mood and affect. Behavior, judgment, thought content normal.   Lab Results  Component Value Date   WBC 5.0 12/08/2009   HGB 13.6 12/21/2012   HCT 40.0 12/21/2012   MCV 96.1 12/08/2009   PLT 180 12/08/2009   Lab Results  Component Value Date   CREATININE 0.80 12/21/2012   BUN 6 12/21/2012   NA 141 12/21/2012   K 2.9* 12/21/2012   CL 103 12/21/2012   CO2 24 09/10/2010    No results found for this basename: HGBA1C   Lipid Panel     Component Value Date/Time   CHOL 144 12/08/2009 2024   TRIG 149 12/08/2009 2024   HDL 52 12/08/2009 2024   CHOLHDL 2.8 Ratio 12/08/2009 2024   VLDL 30 12/08/2009 2024   LDLCALC 62 12/08/2009 2024     Assessment and plan:   Patient Active Problem List   Diagnosis Date Noted  . Depression with anxiety 05/01/2013  . AORTIC VALVE DISORDERS 01/05/2010  . OTHER SPECIFIED CARDIAC DYSRHYTHMIAS 01/05/2010  . ANEMIA, IRON DEFICIENCY NOS 04/30/2007  . 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 HYPERTENSION, ESSENTIAL NOS 04/30/2007  . GERD 04/30/2007   EKG reveals Afib with RVR. - Called EMS and had patient sent to ER for further evaluation and cardiology consult.    Follow up with cardiology as she has not seen them in about 3 years and she was supposed to follow up in 2013.   RTC for BP check in 2 weeks and office visit in 2-3 weeks   The patient was given clear instructions to go to ER or return to medical center if symptoms don't improve, worsen or new problems develop.  The patient verbalized understanding.  The patient was told to call to get lab results if they haven't heard anything in the next week.    Rodney Langton, MD, CDE, FAAFP [facility] [place], [place]

## 2013-05-01 NOTE — ED Notes (Signed)
Per EMS pt was at PCP for check up and noted to be in Afib with hr 170-210.  Pt has no symptoms at this time, denies SOB and pain.  EMS 20g L AC.  EMS gave 20 cardizem with no change.  Pt alert oriented X4

## 2013-05-02 ENCOUNTER — Telehealth: Payer: Self-pay | Admitting: Internal Medicine

## 2013-05-02 ENCOUNTER — Encounter: Payer: Self-pay | Admitting: Physician Assistant

## 2013-05-02 DIAGNOSIS — I5189 Other ill-defined heart diseases: Secondary | ICD-10-CM

## 2013-05-02 DIAGNOSIS — F1210 Cannabis abuse, uncomplicated: Secondary | ICD-10-CM

## 2013-05-02 DIAGNOSIS — I517 Cardiomegaly: Secondary | ICD-10-CM

## 2013-05-02 DIAGNOSIS — F1010 Alcohol abuse, uncomplicated: Secondary | ICD-10-CM

## 2013-05-02 DIAGNOSIS — R739 Hyperglycemia, unspecified: Secondary | ICD-10-CM

## 2013-05-02 LAB — CBC
MCH: 33.3 pg (ref 26.0–34.0)
MCV: 94.4 fL (ref 78.0–100.0)
Platelets: 194 10*3/uL (ref 150–400)
RDW: 12.2 % (ref 11.5–15.5)
WBC: 6.2 10*3/uL (ref 4.0–10.5)

## 2013-05-02 LAB — TROPONIN I: Troponin I: <0.3 ng/mL (ref <0.30)

## 2013-05-02 LAB — LIPID PANEL: Cholesterol: 157 mg/dL (ref 0–200)

## 2013-05-02 LAB — BASIC METABOLIC PANEL
Calcium: 9.7 mg/dL (ref 8.4–10.5)
Chloride: 100 mEq/L (ref 96–112)
Creatinine, Ser: 0.55 mg/dL (ref 0.50–1.10)
GFR calc Af Amer: >90 mL/min (ref >90)

## 2013-05-02 MED ORDER — ALUM & MAG HYDROXIDE-SIMETH 200-200-20 MG/5ML PO SUSP
30.0000 mL | Freq: Four times a day (QID) | ORAL | Status: DC | PRN
  Administered 2013-05-02: 30 mL via ORAL
  Filled 2013-05-02: qty 30

## 2013-05-02 MED ORDER — DILTIAZEM HCL ER COATED BEADS 240 MG PO CP24
240.0000 mg | ORAL_CAPSULE | Freq: Every day | ORAL | Status: DC
  Administered 2013-05-02: 240 mg via ORAL
  Filled 2013-05-02: qty 1

## 2013-05-02 MED ORDER — RIVAROXABAN 20 MG PO TABS: 20.0000 mg | ORAL_TABLET | Freq: Every day | ORAL | Status: DC

## 2013-05-02 MED ORDER — DILTIAZEM HCL ER COATED BEADS 240 MG PO CP24: 240.0000 mg | ORAL_CAPSULE | Freq: Every day | ORAL | Status: DC

## 2013-05-02 NOTE — Progress Notes (Signed)
  Echocardiogram 2D Echocardiogram has been performed.  Cathie Beams 05/02/2013, 7:52 AM

## 2013-05-02 NOTE — Progress Notes (Signed)
Patient Name: Jane Arellano      SUBJECTIVE: feels better without complaints of palps  Past Medical History  Diagnosis Date  . Atypical chest pain     a. 11/2005 Negative Myovie  . Subaortic membrane     a. 01/2010 Echo: EF 55-60%, No rwma, subaortic membrane with elevated LVOT mean gradient of 21 mmHg, Triv AI, mod dil LA, mildly increased PASP.  Marland Kitchen GERD (gastroesophageal reflux disease)   . HTN (hypertension)   . Iron deficiency anemia   . History of substance abuse   . Dysrhythmia     New Afib    PHYSICAL EXAM Filed Vitals:   05/02/13 0400 05/02/13 0500 05/02/13 0800 05/02/13 0900  BP:   152/87 154/91  Pulse:   75 77  Temp: 98.7 F (37.1 C)  98.3 F (36.8 C)   TempSrc: Oral  Oral   Resp:   21 22  Height:      Weight:  186 lb 4.6 oz (84.5 kg)    SpO2:   99% 95%   Well developed and nourished in no acute distress HENT normal Neck supple with JVP-flat Clear Regular rate and rhythm, 3/6 m with split s2Abd-soft with active BS No Clubbing cyanosis edema Skin-warm and dry A & Oriented  Grossly normal sensory and motor function  TELEMETRY: Reviewed telemetry pt in  :NSR with conversionabout 4 am without pause    Intake/Output Summary (Last 24 hours) at 05/02/13 1004 Last data filed at 05/02/13 0800  Gross per 24 hour  Intake 1244.67 ml  Output   1925 ml  Net -680.33 ml    LABS: Basic Metabolic Panel:  Recent Labs Lab 05/01/13 1309 05/01/13 1745 05/02/13 0535  NA 141  --  138  K 3.7  --  3.6  CL 103  --  100  CO2 22  --  23  GLUCOSE 200*  --  210*  BUN 8  --  9  CREATININE 0.45*  --  0.55  CALCIUM 10.0  --  9.7  MG  --  2.1  --    Cardiac Enzymes:  Recent Labs  05/01/13 1745 05/01/13 2352 05/02/13 0533  TROPONINI <0.30 <0.30 <0.30   CBC:  Recent Labs Lab 05/01/13 1309 05/02/13 0535  WBC 6.4 6.2  NEUTROABS 5.2  --   HGB 13.9 13.7  HCT 39.4 38.8  MCV 94.3 94.4  PLT 171 194   PROTIME:  Recent Labs  05/01/13 1309  LABPROT  12.5  INR 0.94   Liver Function Tests:  Recent Labs  05/01/13 1309  AST 23  ALT 24  ALKPHOS 64  BILITOT 0.3  PROT 8.7*  ALBUMIN 4.2   No results found for this basename: LIPASE, AMYLASE,  in the last 72 hours BNP: BNP (last 3 results)  Recent Labs  05/01/13 1309  PROBNP 283.8*   D-Dimer: No results found for this basename: DDIMER,  in the last 72 hours Hemoglobin A1C: No results found for this basename: HGBA1C,  in the last 72 hours Fasting Lipid Panel:  Recent Labs  05/02/13 0535  CHOL 157  HDL 67  LDLCALC 64  TRIG 131  CHOLHDL 2.3   Thyroid Function Tests:  Recent Labs  05/01/13 1745  TSH 1.178     ASSESSMENT AND PLAN:  Active Problems:   GERD   Atrial fibrillation with RVR   Palpitations   Subaortic membrane   HTN (hypertension)   Iron deficiency anemia  conveted spontaneously  Echo pending  Important as she thinks retrospectively she may have been having tachypalpitations for about 1 month  Stop dilt IV and use po dilt instead of norvasc; stop lopressor Would start on Rivaroxaban as  chads2 >1 Chadsvasc 2 Other meds to derive from echo result Anticipate home today  Signed, Sherryl Manges MD  05/02/2013

## 2013-05-02 NOTE — ED Provider Notes (Signed)
Medical screening examination/treatment/procedure(s) were performed by non-physician practitioner and as supervising physician I was immediately available for consultation/collaboration.  Christopher J. Pollina, MD 05/02/13 0706 

## 2013-05-02 NOTE — Telephone Encounter (Signed)
Attempted to contact pt at [phone] -not accepting calls/LMTCB at [phone].

## 2013-05-02 NOTE — Progress Notes (Signed)
Inpatient Diabetes Program Recommendations  AACE/ADA: New Consensus Statement on Inpatient Glycemic Control (2013)  Target Ranges:  Prepandial:   less than 140 mg/dL      Peak postprandial:   less than 180 mg/dL (1-2 hours)      Critically ill patients:  140 - 180 mg/dL  Results for Jane Arellano, Jane Arellano (MRN [id number]) as of 05/02/2013 11:00  Ref. Range 05/01/2013 13:09 05/02/2013 05:35  Glucose Latest Range: 70-99 mg/dL 409 (H) 811 (H)   Inpatient Diabetes Program Recommendations HgbA1C: order to assess prehospital glucose control  No prior documentation of DM. Will follow. Thank you  Piedad Climes BSN, RN,CDE Inpatient Diabetes Coordinator [phone] (team pager)

## 2013-05-02 NOTE — Care Management Note (Signed)
    Page 1 of 1   05/02/2013     2:04:39 PM   CARE MANAGEMENT NOTE 05/02/2013  Patient:  Jane Arellano, Jane Arellano   Account Number:  [id number]  Date Initiated:  05/02/2013  Documentation initiated by:  Junius Creamer  Subjective/Objective Assessment:   at fib w rvr     Action/Plan:   lives w fam, pcp dr c Laural Benes   Anticipated DC Date:  05/02/2013   Anticipated DC Plan:  HOME/SELF CARE      DC Planning Services  CM consult  MATCH Program      Choice offered to / List presented to:             Status of service:   Medicare Important Message given?   (If response is "NO", the following Medicare IM given date fields will be blank) Date Medicare IM given:   Date Additional Medicare IM given:    Discharge Disposition:  HOME/SELF CARE  Per UR Regulation:  Reviewed for med. necessity/level of care/duration of stay  If discussed at Long Length of Stay Meetings, dates discussed:    Comments:  6/12 1401 debbie Trenese Haft rn,bsn pt has orange card. [facility]. she has now been to [facility] to establish as pcp. she plans to go go [facility] for meds. did give pt match letter and explained about 34days of meds and 3.00 per copay for meds 1 time per year. she also has xarelto 10day free card. spoke w co [facility] and they will try and push her as priority as she will hve to do elidg for co pharm prior to getting in meds there.

## 2013-05-02 NOTE — Telephone Encounter (Signed)
New Prob    TCM per Zenda Alpers. Appt scheduled for 6/26 with Tereso Newcomer PA-C.

## 2013-05-02 NOTE — Discharge Summary (Signed)
Discharge Summary   Patient ID: Jane Arellano,  MRN: [id number], DOB/AGE: 04-21-1961 52 y.o.  Admit date: 05/01/2013 Discharge date: 05/02/2013  Primary Physician: Standley Dakins, MD Primary Cardiologist: D. Bensimhon, MD  Discharge Diagnoses Principal Problem:   Atrial fibrillation with RVR  - New onset  - CHADVASc = 2 (HTN, female)  - Discharged on Xarelto 20mg  PO daily, Cardizem CD 240mg  PO daily Active Problems:   Palpitations   Subaortic membrane   Diastolic dysfunction  - 2D echo 05/02/13: LVEF 60-65%, grade 1 diastolic dysfunction, mild LVH, subaortic stenosis w/ turbulation in LVOT c/w subaortic membrane (mean gradient 42 mmHg/peak gradient 81 mmHg), mild biatrial enlargement   Hyperglycemia  - CBGs > 200 x 2 this admission  - Advise follow-up with PCP for DM2 evaluation   GERD   HTN (hypertension)   Iron deficiency anemia  - H/H WNL this admission   ETOH abuse  - Drinks three 40 oz beers daily  - CIWA protocol initiated  - No evidence of DTs  - Cessation stressed   Marijuana abuse  - Cessation advised  Allergies Allergies  Allergen Reactions  . Ketorolac Tromethamine Other (See Comments)    Unknown reaction    Diagnostic Studies/Procedures  TRANSTHORACIC ECHOCARDIOGRAM - 05/02/13  - Left ventricle: The cavity size was normal. Wall thickness was increased in a pattern of mild LVH. Systolic function was normal. The estimated ejection fraction was in the range of 60% to 65%. Wall motion was normal; there were no regional wall motion abnormalities. Doppler parameters are consistent with abnormal left ventricular relaxation (grade 1 diastolic dysfunction). - Aortic valve: No valvular aortic stenosis but suspect subaortic stenosis with mean gradient 42 mmHg/peak gradient 81 mmHg in the LVOT. Trivial regurgitation.  - Mitral valve: No significant regurgitation. - Left atrium: The atrium was mildly dilated. - Right ventricle: Poorly visualized. The cavity size  was normal. Systolic function was normal.  - Right atrium: The atrium was mildly dilated. - Pulmonary arteries: No complete TR doppler jet so unable to estimate PA systolic pressure.  - Inferior vena cava: The vessel was normal in size; the respirophasic diameter changes were in the normal range (= 50%); findings are consistent with normal central venous pressure.   Impressions:  - Normal LV size with mild LV hypertrophy. EF 60-65%. There was no valvular aortic stenosis. There was turbulence in the LV outflow tract with possible subvalvular membrane. There was a significant LVOT gradient mean 42 mmHg/peak 81 mmHg. Would consider TEE to evaluate subvalvular membrane.  History of Present Illness  Jane Arellano is a 52 y.o. female who was admitted to [facility] on 05/01/13 with the above problem list.   She has known history of subaortic membrane (prior echocardiogram she does 11 revealed normal LV function with an elevated LVOT- mean gradient 21 mmHg), GERD, hypertension, history of iron deficiency anemia history of substance abuse. She does have a history of a negative Myoview and 11/2005. It was recommended that her last followup that she received a repeat echocardiogram in 2 years; however she was lost to followup. She had also run out of her antihypertensive medications approximately one year prior to admission due to financial reasons. She has only recently gained her orange card and establish with a primary care provider. She been caring for her ailing mother passed away approximately 2 months ago. She did endorse abusing marijuana and alcohol. She presented to her PCPs office the date of admission and was noted to  be tachycardic. EKG was performed revealing rapid atrial fibrillation. She subsequently presented to [facility] emergency department.  There, EKG confirmed atrial fibrillation with RVR. Initial troponin I returned within normal limits. CMET and CBC were unremarkable. Glucose  did return elevated at 200. Pro BNP was very mildly elevated. She was started on diltiazem IV with rates in the 140s to 160s. This was continued in addition to the metoprolol IV as needed. Her CHA2DS2VASc score was determined to be 2, however given her EtOH and marijuana abuse, she was felt to be a poor Coumadin candidate. Xarelto was suspected to be a better option for compliance. Case management/CSW was consulted for assistance with her orange card.    Hospital Course   She was admitted for rate-control and spontaneously converted overnight. She received a dose of Xarelto that evening. She was placed on CIWA protocol. There was no evidence of DTs. Three subsequent sets of trop-I returned WNL. TSH and free T4 were WNL. Another glucose level returned elevated at 210. A lipid panel revealed good control- LDL 64, HDL 67, TG 131, TC 157. She underwent 2D echocardiogram this morning which revealed the above findings. She was evaluated by Dr. Graciela Husbands today and deemed stable for discharge after reviewing the formal interpretation of her echocardiogram. She was transitioned to diltiazem PO. Metoprolol was discontinued. She was continued on Xarelto. Arrangements have been made through case management/CSW for the [facility] program. Medications have been prescribed accordingly. She will follow-up within 14 days as part of TCM. This information, including supplemental atrial fibrillation education, has been clearly outlined in the discharge AVS.   Discharge Vitals:  Blood pressure 104/62, pulse 61, temperature 98.1 F (36.7 C), temperature source Oral, resp. rate 18, height 5' 6.5" (1.689 m), weight 84.5 kg (186 lb 4.6 oz), SpO2 99.00%.   Labs: Recent Labs     05/01/13  1309  05/02/13  0535  WBC  6.4  6.2  HGB  13.9  13.7  HCT  39.4  38.8  MCV  94.3  94.4  PLT  171  194   Recent Labs Lab 05/01/13 1309 05/02/13 0535  NA 141 138  K 3.7 3.6  CL 103 100  CO2 22 23  BUN 8 9  CREATININE 0.45* 0.55  CALCIUM  10.0 9.7  PROT 8.7*  --   BILITOT 0.3  --   ALKPHOS 64  --   ALT 24  --   AST 23  --   GLUCOSE 200* 210*   Recent Labs     05/01/13  1745  05/01/13  2352  05/02/13  0533  TROPONINI  <0.30  <0.30  <0.30   Recent Labs     05/02/13  0535  CHOL  157  HDL  67  LDLCALC  64  TRIG  131  CHOLHDL  2.3    Recent Labs  05/01/13 1745  TSH 1.178    Disposition:  Discharge Orders   Future Appointments Provider Department Dept Phone   05/16/2013 2:20 PM Beatrice Lecher, PA-C [facility] Main [facility] [place]) [phone]   Future Orders Complete By Expires     Diet - low sodium heart healthy  As directed     Increase activity slowly  As directed           Follow-up Information   Follow up with Tereso Newcomer, PA-C On 05/16/2013. (At 2:20 PM for post-hospital follow-up. )    Contact information:   [address] [place]  [address] [phone]       Follow up with Standley Dakins, MD. Schedule an appointment as soon as possible for a visit in 1 week. (For elevated blood sugars in the hospital. )    Contact information:   [address] [place] [address] [phone]       Discharge Medications:    Medication List    STOP taking these medications       amLODipine 10 MG tablet  Commonly known as:  NORVASC     metoprolol succinate 25 MG 24 hr tablet  Commonly known as:  TOPROL-XL      TAKE these medications       Calcium-Vitamin D3 500-400 MG-UNIT Tabs  Take 1 tablet by mouth daily.     diltiazem 240 MG 24 hr capsule  Commonly known as:  CARDIZEM CD  Take 1 capsule (240 mg total) by mouth daily.     ferrous sulfate 325 (65 FE) MG tablet  Take 325 mg by mouth daily with breakfast.     fish oil-omega-3 fatty acids 1000 MG capsule  Take 1 g by mouth daily.     furosemide 20 MG tablet  Commonly known as:  LASIX  Take 1 tablet (20 mg total) by mouth daily.     MULTIVITAMIN PO  Take 1 tablet by mouth daily.     potassium  chloride 10 MEQ tablet  Commonly known as:  K-DUR,KLOR-CON  Take 1 tablet (10 mEq total) by mouth daily.     Rivaroxaban 20 MG Tabs  Commonly known as:  XARELTO  Take 1 tablet (20 mg total) by mouth daily with supper.     sertraline 100 MG tablet  Commonly known as:  ZOLOFT  Take 0.5 tab po daily for 1 week, then take 1 po daily       Outstanding Labs/Studies: None  Duration of Discharge Encounter: Greater than 30 minutes including physician time.  Signed, R. Hurman Horn, PA-C 05/02/2013, 1:20 PM

## 2013-05-02 NOTE — Progress Notes (Signed)
Discharged  Home accompanied by daughters, stable. Discharged instructions and prescriptions  given to pt.Belongings with pt.

## 2013-05-02 NOTE — Progress Notes (Signed)
Nutrition Brief Note  Malnutrition Screening Tool result is inaccurate.  Please consult if nutrition needs are identified.  Dymond Spreen Kowalski RD, LDN Pager #[phone] After Hours pager #[phone]   

## 2013-05-06 ENCOUNTER — Emergency Department (HOSPITAL_COMMUNITY): Payer: No Typology Code available for payment source

## 2013-05-06 ENCOUNTER — Inpatient Hospital Stay (HOSPITAL_COMMUNITY)
Admission: EM | Admit: 2013-05-06 | Discharge: 2013-05-09 | DRG: 310 | Disposition: A | Discharge status: Home / Self Care | Payer: No Typology Code available for payment source | Attending: Internal Medicine | Admitting: Internal Medicine

## 2013-05-06 ENCOUNTER — Encounter (HOSPITAL_COMMUNITY): Payer: Self-pay | Admitting: *Deleted

## 2013-05-06 DIAGNOSIS — E1065 Type 1 diabetes mellitus with hyperglycemia: Secondary | ICD-10-CM | POA: Diagnosis present

## 2013-05-06 DIAGNOSIS — I480 Paroxysmal atrial fibrillation: Secondary | ICD-10-CM

## 2013-05-06 DIAGNOSIS — I4891 Unspecified atrial fibrillation: Principal | ICD-10-CM

## 2013-05-06 DIAGNOSIS — F1420 Cocaine dependence, uncomplicated: Secondary | ICD-10-CM

## 2013-05-06 DIAGNOSIS — I359 Nonrheumatic aortic valve disorder, unspecified: Secondary | ICD-10-CM

## 2013-05-06 DIAGNOSIS — I421 Obstructive hypertrophic cardiomyopathy: Secondary | ICD-10-CM | POA: Diagnosis present

## 2013-05-06 DIAGNOSIS — E108 Type 1 diabetes mellitus with unspecified complications: Secondary | ICD-10-CM | POA: Diagnosis present

## 2013-05-06 DIAGNOSIS — I519 Heart disease, unspecified: Secondary | ICD-10-CM | POA: Diagnosis present

## 2013-05-06 DIAGNOSIS — F3289 Other specified depressive episodes: Secondary | ICD-10-CM | POA: Diagnosis present

## 2013-05-06 DIAGNOSIS — I10 Essential (primary) hypertension: Secondary | ICD-10-CM | POA: Diagnosis present

## 2013-05-06 DIAGNOSIS — R739 Hyperglycemia, unspecified: Secondary | ICD-10-CM

## 2013-05-06 DIAGNOSIS — I498 Other specified cardiac arrhythmias: Secondary | ICD-10-CM | POA: Diagnosis present

## 2013-05-06 DIAGNOSIS — K219 Gastro-esophageal reflux disease without esophagitis: Secondary | ICD-10-CM | POA: Diagnosis present

## 2013-05-06 DIAGNOSIS — A599 Trichomoniasis, unspecified: Secondary | ICD-10-CM

## 2013-05-06 DIAGNOSIS — F418 Other specified anxiety disorders: Secondary | ICD-10-CM

## 2013-05-06 DIAGNOSIS — R9431 Abnormal electrocardiogram [ECG] [EKG]: Secondary | ICD-10-CM

## 2013-05-06 DIAGNOSIS — R002 Palpitations: Secondary | ICD-10-CM | POA: Diagnosis present

## 2013-05-06 DIAGNOSIS — Z79899 Other long term (current) drug therapy: Secondary | ICD-10-CM

## 2013-05-06 DIAGNOSIS — E119 Type 2 diabetes mellitus without complications: Secondary | ICD-10-CM

## 2013-05-06 DIAGNOSIS — F1210 Cannabis abuse, uncomplicated: Secondary | ICD-10-CM | POA: Diagnosis present

## 2013-05-06 DIAGNOSIS — Q244 Congenital subaortic stenosis: Secondary | ICD-10-CM

## 2013-05-06 DIAGNOSIS — F1010 Alcohol abuse, uncomplicated: Secondary | ICD-10-CM | POA: Diagnosis present

## 2013-05-06 DIAGNOSIS — F329 Major depressive disorder, single episode, unspecified: Secondary | ICD-10-CM | POA: Diagnosis present

## 2013-05-06 DIAGNOSIS — IMO0002 Reserved for concepts with insufficient information to code with codable children: Secondary | ICD-10-CM | POA: Diagnosis present

## 2013-05-06 HISTORY — DX: Cardiac murmur, unspecified: R01.1

## 2013-05-06 LAB — URINALYSIS, ROUTINE W REFLEX MICROSCOPIC
Bilirubin Urine: NEGATIVE
Ketones, ur: NEGATIVE mg/dL
Nitrite: NEGATIVE
Specific Gravity, Urine: 1.011 (ref 1.005–1.030)
Urobilinogen, UA: 0.2 mg/dL (ref 0.0–1.0)

## 2013-05-06 LAB — URINE MICROSCOPIC-ADD ON

## 2013-05-06 LAB — TROPONIN I
Troponin I: <0.3 ng/mL (ref <0.30)
Troponin I: <0.3 ng/mL (ref <0.30)

## 2013-05-06 LAB — RAPID URINE DRUG SCREEN, HOSP PERFORMED
Amphetamines: NOT DETECTED
Barbiturates: NOT DETECTED
Benzodiazepines: NOT DETECTED

## 2013-05-06 LAB — POCT I-STAT, CHEM 8
Calcium, Ion: 1.13 mmol/L (ref 1.12–1.23)
Chloride: 104 mEq/L (ref 96–112)
HCT: 40 % (ref 36.0–46.0)
Sodium: 139 mEq/L (ref 135–145)

## 2013-05-06 LAB — POCT I-STAT TROPONIN I

## 2013-05-06 LAB — HEMOGLOBIN A1C
Hgb A1c MFr Bld: 7.1 % — ABNORMAL HIGH (ref <5.7)
Mean Plasma Glucose: 157 mg/dL — ABNORMAL HIGH (ref <117)

## 2013-05-06 LAB — CBC
MCV: 93.4 fL (ref 78.0–100.0)
Platelets: 189 10*3/uL (ref 150–400)
RBC: 4.07 MIL/uL (ref 3.87–5.11)
RDW: 11.8 % (ref 11.5–15.5)
WBC: 5.8 10*3/uL (ref 4.0–10.5)

## 2013-05-06 MED ORDER — LORAZEPAM 2 MG/ML IJ SOLN
1.0000 mg | Freq: Once | INTRAMUSCULAR | Status: AC
  Administered 2013-05-06: 2 mg via INTRAVENOUS
  Filled 2013-05-06: qty 1

## 2013-05-06 MED ORDER — POTASSIUM CHLORIDE CRYS ER 20 MEQ PO TBCR
40.0000 meq | EXTENDED_RELEASE_TABLET | Freq: Once | ORAL | Status: AC
  Administered 2013-05-06: 40 meq via ORAL
  Filled 2013-05-06: qty 2

## 2013-05-06 MED ORDER — ACETAMINOPHEN 650 MG RE SUPP: 650.0000 mg | Freq: Four times a day (QID) | RECTAL | Status: DC | PRN

## 2013-05-06 MED ORDER — BIOTENE DRY MOUTH MT LIQD
15.0000 mL | Freq: Every day | OROMUCOSAL | Status: DC
  Administered 2013-05-07 – 2013-05-08 (×2): 15 mL via OROMUCOSAL

## 2013-05-06 MED ORDER — PANTOPRAZOLE SODIUM 40 MG PO TBEC
40.0000 mg | DELAYED_RELEASE_TABLET | Freq: Every day | ORAL | Status: DC
  Administered 2013-05-07 – 2013-05-09 (×2): 40 mg via ORAL
  Filled 2013-05-06 (×2): qty 1

## 2013-05-06 MED ORDER — ACETAMINOPHEN 325 MG PO TABS
650.0000 mg | ORAL_TABLET | Freq: Four times a day (QID) | ORAL | Status: DC | PRN
  Administered 2013-05-06 – 2013-05-08 (×3): 650 mg via ORAL
  Filled 2013-05-06 (×3): qty 2

## 2013-05-06 MED ORDER — MORPHINE SULFATE 2 MG/ML IJ SOLN
1.0000 mg | INTRAMUSCULAR | Status: DC | PRN
  Administered 2013-05-07: 1 mg via INTRAVENOUS
  Filled 2013-05-06: qty 1

## 2013-05-06 MED ORDER — SODIUM CHLORIDE 0.9 % IJ SOLN
3.0000 mL | Freq: Two times a day (BID) | INTRAMUSCULAR | Status: DC
  Administered 2013-05-06 – 2013-05-08 (×4): 3 mL via INTRAVENOUS

## 2013-05-06 MED ORDER — CALCIUM-VITAMIN D3 500-400 MG-UNIT PO TABS: 1.0000 | ORAL_TABLET | Freq: Every day | ORAL | Status: DC

## 2013-05-06 MED ORDER — OMEGA-3 FATTY ACIDS 1000 MG PO CAPS: 1.0000 g | ORAL_CAPSULE | Freq: Every day | ORAL | Status: DC

## 2013-05-06 MED ORDER — POTASSIUM CHLORIDE CRYS ER 10 MEQ PO TBCR
10.0000 meq | EXTENDED_RELEASE_TABLET | Freq: Every day | ORAL | Status: DC
  Administered 2013-05-06: 10 meq via ORAL
  Filled 2013-05-06: qty 1

## 2013-05-06 MED ORDER — CALCIUM-VITAMIN D3 500-400 MG-UNIT PO TABS
1.0000 | ORAL_TABLET | Freq: Every day | ORAL | Status: DC
Start: 2013-05-06 — End: 2013-05-06

## 2013-05-06 MED ORDER — OMEGA-3-ACID ETHYL ESTERS 1 G PO CAPS
1.0000 g | ORAL_CAPSULE | Freq: Every day | ORAL | Status: DC
  Administered 2013-05-06 – 2013-05-09 (×4): 1 g via ORAL
  Filled 2013-05-06 (×4): qty 1

## 2013-05-06 MED ORDER — RIVAROXABAN 20 MG PO TABS
20.0000 mg | ORAL_TABLET | Freq: Every day | ORAL | Status: DC
  Administered 2013-05-06 – 2013-05-08 (×3): 20 mg via ORAL
  Filled 2013-05-06 (×4): qty 1

## 2013-05-06 MED ORDER — FERROUS SULFATE 325 (65 FE) MG PO TABS
325.0000 mg | ORAL_TABLET | Freq: Every day | ORAL | Status: DC
  Administered 2013-05-07 – 2013-05-09 (×3): 325 mg via ORAL
  Filled 2013-05-06 (×6): qty 1

## 2013-05-06 MED ORDER — GI COCKTAIL ~~LOC~~
30.0000 mL | Freq: Once | ORAL | Status: AC
  Administered 2013-05-06: 30 mL via ORAL
  Filled 2013-05-06: qty 30

## 2013-05-06 MED ORDER — ONDANSETRON HCL 4 MG PO TABS: 4.0000 mg | ORAL_TABLET | Freq: Four times a day (QID) | ORAL | Status: DC | PRN

## 2013-05-06 MED ORDER — FUROSEMIDE 20 MG PO TABS
20.0000 mg | ORAL_TABLET | Freq: Every day | ORAL | Status: DC
Start: 2013-05-06 — End: 2013-05-06
  Administered 2013-05-06: 20 mg via ORAL
  Filled 2013-05-06: qty 1

## 2013-05-06 MED ORDER — SERTRALINE HCL 100 MG PO TABS
100.0000 mg | ORAL_TABLET | Freq: Every day | ORAL | Status: DC
  Administered 2013-05-06 – 2013-05-09 (×3): 100 mg via ORAL
  Filled 2013-05-06 (×4): qty 1

## 2013-05-06 MED ORDER — DILTIAZEM HCL ER COATED BEADS 240 MG PO CP24
240.0000 mg | ORAL_CAPSULE | Freq: Every day | ORAL | Status: DC
  Administered 2013-05-06 – 2013-05-07 (×2): 240 mg via ORAL
  Filled 2013-05-06 (×2): qty 1

## 2013-05-06 MED ORDER — VITAMIN B-1 100 MG PO TABS
100.0000 mg | ORAL_TABLET | Freq: Every day | ORAL | Status: DC
  Administered 2013-05-06 – 2013-05-09 (×4): 100 mg via ORAL
  Filled 2013-05-06 (×4): qty 1

## 2013-05-06 MED ORDER — ONDANSETRON HCL 4 MG/2ML IJ SOLN: 4.0000 mg | Freq: Four times a day (QID) | INTRAMUSCULAR | Status: DC | PRN

## 2013-05-06 MED ORDER — CALCIUM CARBONATE-VITAMIN D 500-200 MG-UNIT PO TABS
1.0000 | ORAL_TABLET | Freq: Every day | ORAL | Status: DC
  Administered 2013-05-06: 1 via ORAL
  Filled 2013-05-06: qty 1

## 2013-05-06 MED ORDER — ZOLPIDEM TARTRATE 5 MG PO TABS
5.0000 mg | ORAL_TABLET | Freq: Every evening | ORAL | Status: DC | PRN
  Administered 2013-05-07 – 2013-05-08 (×2): 5 mg via ORAL
  Filled 2013-05-06 (×2): qty 1

## 2013-05-06 MED ORDER — SODIUM CHLORIDE 0.9 % IJ SOLN: 3.0000 mL | Freq: Two times a day (BID) | INTRAMUSCULAR | Status: DC

## 2013-05-06 MED ORDER — METRONIDAZOLE 500 MG PO TABS
2000.0000 mg | ORAL_TABLET | Freq: Once | ORAL | Status: AC
  Administered 2013-05-06: 2000 mg via ORAL
  Filled 2013-05-06: qty 4

## 2013-05-06 MED ORDER — PNEUMOCOCCAL VAC POLYVALENT 25 MCG/0.5ML IJ INJ
0.5000 mL | INJECTION | INTRAMUSCULAR | Status: AC
  Administered 2013-05-07: 0.5 mL via INTRAMUSCULAR
  Filled 2013-05-06: qty 0.5

## 2013-05-06 NOTE — ED Notes (Addendum)
Pt c/o fast heart rate starting approx 2330 last pm, pt states she was dx w/A-Fib last week and is unsure of what is going on and does not know when she should come to the dr or just stay home. Pt also reports bleeding gums since starting Coumadin, pt states they didn't tell me to switch from a hard toothbrush to a soft toothbrush.   Pt denies chest pain, SOB, N/V, cough, congestion, fever, or abd/back pain

## 2013-05-06 NOTE — ED Provider Notes (Signed)
History     CSN: [id number]  Arrival date & time 05/06/13  0041   First MD Initiated Contact with Patient 05/06/13 0126      Chief Complaint  Patient presents with  . Tachycardia    (Consider location/radiation/quality/duration/timing/severity/associated sxs/prior treatment) Patient is a 52 y.o. female presenting with palpitations. The history is provided by the patient.  Palpitations Palpitations quality:  Regular Onset quality:  Sudden Timing:  Constant Progression:  Resolved Chronicity:  Recurrent Context: not appetite suppressants   Relieved by:  Nothing Worsened by:  Nothing tried Ineffective treatments:  None tried Associated symptoms: no shortness of breath and no syncope     Past Medical History  Diagnosis Date  . Atypical chest pain     a. 11/2005 Negative Myoview  . Subaortic membrane     a. 01/2010 Echo: EF 55-60%, No rwma, subaortic membrane with elevated LVOT mean gradient of 21 mmHg, Triv AI, mod dil LA, mildly increased PASP. b. 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 GERD (gastroesophageal reflux disease)   . HTN (hypertension)   . Iron deficiency anemia   . History of substance abuse   . Paroxysmal atrial fibrillation 05/01/2013    On Xarelto  . Diastolic dysfunction     History reviewed. No pertinent past surgical history.  Family History  Problem Relation Age of Onset  . Hypertension Sister     alive and well  . Diabetes Father     deceased  . Cancer Mother     deceased @ 64  . Stroke Mother   . Hypertension Sister     alive and well  . Heart attack Brother     deceased @ 70    History  Substance Use Topics  . Smoking status: Never Smoker   . Smokeless tobacco: Not on file  . Alcohol Use: Yes     Comment: Drinks 3 - 40 oz beers daily.    OB History   Grav Para  Term Preterm Abortions TAB SAB Ect Mult Living                  Review of Systems  Respiratory: Negative for shortness of breath.   Cardiovascular: Positive for palpitations. Negative for syncope.  All other systems reviewed and are negative.    Allergies  Ketorolac tromethamine  Home Medications   Current Outpatient Rx  Name  Route  Sig  Dispense  Refill  . Calcium Carb-Cholecalciferol (CALCIUM-VITAMIN D3) 500-400 MG-UNIT TABS   Oral   Take 1 tablet by mouth daily.         Marland Kitchen diltiazem (CARDIZEM CD) 240 MG 24 hr capsule   Oral   Take 1 capsule (240 mg total) by mouth daily.   30 capsule   3   . ferrous sulfate 325 (65 FE) MG tablet   Oral   Take 325 mg by mouth daily with breakfast.         . fish oil-omega-3 fatty acids 1000 MG capsule   Oral   Take 1 g by mouth daily.         . furosemide (LASIX) 20 MG tablet   Oral   Take 1 tablet (20 mg total) by mouth daily.   30 tablet   3   . Multiple Vitamins-Minerals (MULTIVITAMIN PO)   Oral   Take 1 tablet  by mouth daily.         . potassium chloride (K-DUR,KLOR-CON) 10 MEQ tablet   Oral   Take 1 tablet (10 mEq total) by mouth daily.   30 tablet   3   . Rivaroxaban (XARELTO) 20 MG TABS   Oral   Take 1 tablet (20 mg total) by mouth daily with supper.   30 tablet   3   . sertraline (ZOLOFT) 100 MG tablet      Take 0.5 tab po daily for 1 week, then take 1 po daily   30 tablet   3     BP 182/81  Pulse 101  Temp(Src) 98.3 F (36.8 C) (Oral)  Resp 22  SpO2 99%  Physical Exam  Constitutional: She is oriented to person, place, and time. She appears well-developed and well-nourished. No distress.  HENT:  Head: Normocephalic and atraumatic.  Mouth/Throat: Oropharynx is clear and moist.  Eyes: Conjunctivae are normal. Pupils are equal, round, and reactive to light.  Neck: Normal range of motion. Neck supple.  Cardiovascular: Normal rate, regular rhythm and intact distal pulses.    Pulmonary/Chest: Effort normal and breath sounds normal. She has no wheezes. She has no rales.  Abdominal: Soft. Bowel sounds are normal. There is no tenderness. There is no rebound and no guarding.  Musculoskeletal: Normal range of motion.  Neurological: She is alert and oriented to person, place, and time.  Skin: Skin is warm and dry.  Psychiatric: She has a normal mood and affect.    ED Course  Procedures (including critical care time)  Labs Reviewed  POCT I-STAT, CHEM 8 - Abnormal; Notable for the following:    Potassium 3.4 (*)    Glucose, Bld 210 (*)    All other components within normal limits  CBC  DRUG SCREEN PANEL (SERUM)  URINALYSIS, ROUTINE W REFLEX MICROSCOPIC   No results found.   No diagnosis found.    MDM   Date: 05/06/2013  Rate: 104  Rhythm: sinus tachycardia  QRS Axis: normal  Intervals: QT prolonged  ST/T Wave abnormalities: nonspecific ST changes  Conduction Disutrbances:none  Narrative Interpretation:   Old EKG Reviewed: changes noted        Case d/w Dr. Tresa Endo of cardiology admit to medicine for oservation  Kelli Robeck K Anays Detore-Rasch, MD 05/06/13 [phone]

## 2013-05-06 NOTE — Progress Notes (Signed)
Pt complaint of headache and burning in chest, pt questions whether symptoms are related to her daily medication . Denies chest pain, sob, 12 lead EKG obtained & Dr. Blake Divine made aware Georgette Dover

## 2013-05-06 NOTE — Progress Notes (Signed)
[facility] PROGRESS NOTE  Jane Arellano ZOX:[id number] DOB: 11/24/1960 DOA: 05/06/2013 PCP: Standley Dakins, MD  Assessment/Plan:  Palpitations with nonspecific EKG changes in the inferior lateral leads - cycle cardiac markers.  Repeat EKG IN AM.   Per records patient had a Myoview in 2007 which was negative. Patient's recent thyroid function test was normal . Hyperglycemia - check hemoglobin A1c.  A. Fib - presently rate controlled. Continue Cardizem and xarelto. History of subvalvular aortic membrane - per cardiology.  History of alcoholism - states she has not had alcohol for more than a week now. Patient has been placed on thiamine.  History of drug abuse - denies having taken any cocaine recently for last 9 years. Smoked marijuana last week. Check drug screen.  Hypertension - continue home medications.  Code Status: full code.  Family Communication: family at bedside Disposition Plan: pending.    Consultants:  none  Procedures:  none  Antibiotics:  None.   HPI/Subjective: Reports havinga headche, and burning sensation in the epigastric area. Reports she started having the symptoms since she started medications  on 6/12.   Objective: Filed Vitals:   05/06/13 0715 05/06/13 0745 05/06/13 0832 05/06/13 1310  BP: 152/78 135/69 152/89 155/89  Pulse: 72 76 78 78  Temp:   98.7 F (37.1 C) 98.8 F (37.1 C)  TempSrc:   Oral Oral  Resp:    17  Height:   5' 6.5" (1.689 m)   Weight:   83.1 kg (183 lb 3.2 oz)   SpO2: 100% 100% 100% 100%    Intake/Output Summary (Last 24 hours) at 05/06/13 1856 Last data filed at 05/06/13 1630  Gross per 24 hour  Intake    600 ml  Output    350 ml  Net    250 ml   Filed Weights   05/06/13 0832  Weight: 83.1 kg (183 lb 3.2 oz)    Exam:  Cardiovascular: S1-S2 heard.  Respiratory: No rhonchi or crepitations.  Abdomen: Soft nontender bowel sounds present.    Musculoskeletal: No edema.   Neurologic: Alert awake oriented  to time place and person. Moves all extremities.   Data Reviewed: Basic Metabolic Panel:  Recent Labs Lab 05/01/13 1309 05/01/13 1745 05/02/13 0535 05/06/13 0204  NA 141  --  138 139  K 3.7  --  3.6 3.4*  CL 103  --  100 104  CO2 22  --  23  --   GLUCOSE 200*  --  210* 210*  BUN 8  --  9 11  CREATININE 0.45*  --  0.55 0.70  CALCIUM 10.0  --  9.7  --   MG  --  2.1  --   --    Liver Function Tests:  Recent Labs Lab 05/01/13 1309  AST 23  ALT 24  ALKPHOS 64  BILITOT 0.3  PROT 8.7*  ALBUMIN 4.2   No results found for this basename: LIPASE, AMYLASE,  in the last 168 hours No results found for this basename: AMMONIA,  in the last 168 hours CBC:  Recent Labs Lab 05/01/13 1309 05/02/13 0535 05/06/13 0158 05/06/13 0204  WBC 6.4 6.2 5.8  --   NEUTROABS 5.2  --   --   --   HGB 13.9 13.7 13.3 13.6  HCT 39.4 38.8 38.0 40.0  MCV 94.3 94.4 93.4  --   PLT 171 194 189  --    Cardiac Enzymes:  Recent Labs Lab 05/01/13 1745 05/01/13 2352 05/02/13 0533  05/06/13 1000 05/06/13 1454  TROPONINI <0.30 <0.30 <0.30 <0.30 <0.30   BNP (last 3 results)  Recent Labs  05/01/13 1309  PROBNP 283.8*   CBG: No results found for this basename: GLUCAP,  in the last 168 hours  Recent Results (from the past 240 hour(s))  MRSA PCR SCREENING     Status: None   Collection Time    05/01/13  4:34 PM      Result Value Range Status   MRSA by PCR NEGATIVE  NEGATIVE Final   Comment:            The GeneXpert MRSA Assay (FDA     approved for NASAL specimens     only), is one component of a     comprehensive MRSA colonization     surveillance program. It is not     intended to diagnose MRSA     infection nor to guide or     monitor treatment for     MRSA infections.     Studies: Dg Chest 2 View  05/06/2013   *RADIOLOGY REPORT*  Clinical Data: Tachycardia.  CHEST - 2 VIEW  Comparison: Chest x-ray 12/21/2012.  Findings: Lung volumes are normal.  No consolidative airspace  disease.  No pleural effusions.  No pneumothorax.  No pulmonary nodule or mass noted.  Pulmonary vasculature and the cardiomediastinal silhouette are within normal limits.  IMPRESSION: 1. No radiographic evidence of acute cardiopulmonary disease.   Original Report Authenticated By: Trudie Reed, M.D.    Scheduled Meds: . antiseptic oral rinse  15 mL Mouth Rinse Daily  . diltiazem  240 mg Oral Daily  . [START ON 05/07/2013] ferrous sulfate  325 mg Oral Q breakfast  . omega-3 acid ethyl esters  1 g Oral Daily  . [START ON 05/07/2013] pantoprazole  40 mg Oral Q0600  . pneumococcal 23 valent vaccine  0.5 mL Intramuscular Tomorrow-1000  . Rivaroxaban  20 mg Oral Q supper  . sertraline  100 mg Oral Daily  . sodium chloride  3 mL Intravenous Q12H  . sodium chloride  3 mL Intravenous Q12H  . thiamine  100 mg Oral Daily   Continuous Infusions:   Principal Problem:   Palpitations Active Problems:   HYPERTENSION, ESSENTIAL NOS   Subaortic membrane   Atrial fibrillation    Time spent: 25 min    Jane Arellano  [facility] Pager [phone]. If 7PM-7AM, please contact night-coverage at [email or web address], password [facility] 05/06/2013, 6:56 PM  LOS: 0 days

## 2013-05-06 NOTE — ED Notes (Signed)
The pt is c/o a fast heart rate just pta tonight.  She was diagnosed with af  For the first time last week.  She has no pain not sob

## 2013-05-06 NOTE — H&P (Signed)
[facility] History and Physical  Jane Arellano JXB:[id number] DOB: 1961-08-11 DOA: 05/06/2013  Referring physician: ER physician. PCP: Standley Dakins, MD  Specialists: Dr.Bensimon.  Chief Complaint: Palpitations.  HPI: Jane Arellano is a 52 y.o. female who was recently admitted for A. fib with RVR and was placed on xarelto and Cardizem started experiencing palpitation last night around 11 PM. Patient states that she was taking her medications as advised. Chest pain shortness of breath productive cough nausea vomiting. In the ER patient's EKG was showing sinus tachycardia with some ST changes in inferior lateral leads. On-call cardiologist Dr. Tresa Endo was consulted by ER physician. Cardiologist at this time is advised to admit and cycle cardiac markers. Patient at this time denies any chest pain or shortness of breath on my exam. Palpitations have resolved.  Review of Systems: As presented in the history of presenting illness, rest negative.  Past Medical History  Diagnosis Date  . Atypical chest pain     a. 11/2005 Negative Myoview  . Subaortic membrane     a. 01/2010 Echo: EF 55-60%, No rwma, subaortic membrane with elevated LVOT mean gradient of 21 mmHg, Triv AI, mod dil LA, mildly increased PASP. b. 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 GERD (gastroesophageal reflux disease)   . HTN (hypertension)   . Iron deficiency anemia   . History of substance abuse   . Paroxysmal atrial fibrillation 05/01/2013    On Xarelto  . Diastolic dysfunction    History reviewed. No pertinent past surgical history. Social History:  reports that she has never smoked. She does not have any smokeless tobacco history on file. She reports that  drinks alcohol. She reports that she uses illicit drugs (Marijuana). Home. where  does patient live-- Can do ADLs. Can patient participate in ADLs?  Allergies  Allergen Reactions  . Ketorolac Tromethamine Other (See Comments)    Unknown reaction    Family History  Problem Relation Age of Onset  . Hypertension Sister     alive and well  . Diabetes Father     deceased  . Cancer Mother     deceased @ 81  . Stroke Mother   . Hypertension Sister     alive and well  . Heart attack Brother     deceased @ 92      Prior to Admission medications   Medication Sig Start Date End Date Taking? Authorizing Provider  antiseptic oral rinse (BIOTENE) LIQD 15 mLs by Mouth Rinse route daily.   Yes Historical Provider, MD  Calcium Carb-Cholecalciferol (CALCIUM-VITAMIN D3) 500-400 MG-UNIT TABS Take 1 tablet by mouth daily.   Yes Historical Provider, MD  diltiazem (CARDIZEM CD) 240 MG 24 hr capsule Take 1 capsule (240 mg total) by mouth daily. 05/02/13  Yes Roger A Arguello, PA-C  ferrous sulfate 325 (65 FE) MG tablet Take 325 mg by mouth daily with breakfast.   Yes Historical Provider, MD  fish oil-omega-3 fatty acids 1000 MG capsule Take 1 g by mouth daily.   Yes Historical Provider, MD  furosemide (LASIX) 20 MG tablet Take 1 tablet (20 mg total) by mouth daily. 05/01/13  Yes Clanford Cyndie Mull, MD  Multiple Vitamins-Minerals (MULTIVITAMIN PO) Take 1 tablet by mouth daily.   Yes Historical Provider, MD  potassium chloride (K-DUR,KLOR-CON) 10 MEQ tablet Take 1 tablet (10 mEq total) by  mouth daily. 05/01/13  Yes Clanford Cyndie Mull, MD  Rivaroxaban (XARELTO) 20 MG TABS Take 1 tablet (20 mg total) by mouth daily with supper. 05/02/13  Yes Roger A Arguello, PA-C  sertraline (ZOLOFT) 100 MG tablet Take 0.5 tab po daily for 1 week, then take 1 po daily 05/01/13  Yes Clanford Cyndie Mull, MD   Physical Exam: Filed Vitals:   05/06/13 0315 05/06/13 0415 05/06/13 0500 05/06/13 0530  BP:      Pulse: 70 63 66 72  Temp:      TempSrc:      Resp: 11 14 20 15   SpO2: 100% 100% 100% 100%      General:  Well developed and nourished.  Eyes: Anicteric no pallor.  ENT: No discharge from the ears eyes nose mouth.  Neck: No mass felt.  Cardiovascular: S1-S2 heard.  Respiratory: No rhonchi or crepitations.  Abdomen: Soft nontender bowel sounds present.  Skin: No rash.  Musculoskeletal: No edema.  Psychiatric: Appears normal.  Neurologic: Alert awake oriented to time place and person. Moves all extremities.  Labs on Admission:  Basic Metabolic Panel:  Recent Labs Lab 05/01/13 1309 05/01/13 1745 05/02/13 0535 05/06/13 0204  NA 141  --  138 139  K 3.7  --  3.6 3.4*  CL 103  --  100 104  CO2 22  --  23  --   GLUCOSE 200*  --  210* 210*  BUN 8  --  9 11  CREATININE 0.45*  --  0.55 0.70  CALCIUM 10.0  --  9.7  --   MG  --  2.1  --   --    Liver Function Tests:  Recent Labs Lab 05/01/13 1309  AST 23  ALT 24  ALKPHOS 64  BILITOT 0.3  PROT 8.7*  ALBUMIN 4.2   No results found for this basename: LIPASE, AMYLASE,  in the last 168 hours No results found for this basename: AMMONIA,  in the last 168 hours CBC:  Recent Labs Lab 05/01/13 1309 05/02/13 0535 05/06/13 0158 05/06/13 0204  WBC 6.4 6.2 5.8  --   NEUTROABS 5.2  --   --   --   HGB 13.9 13.7 13.3 13.6  HCT 39.4 38.8 38.0 40.0  MCV 94.3 94.4 93.4  --   PLT 171 194 189  --    Cardiac Enzymes:  Recent Labs Lab 05/01/13 1745 05/01/13 2352 05/02/13 0533  TROPONINI <0.30 <0.30 <0.30    BNP (last 3 results)  Recent Labs  05/01/13 1309  PROBNP 283.8*   CBG: No results found for this basename: GLUCAP,  in the last 168 hours  Radiological Exams on Admission: Dg Chest 2 View  05/06/2013   *RADIOLOGY REPORT*  Clinical Data: Tachycardia.  CHEST - 2 VIEW  Comparison: Chest x-ray 12/21/2012.  Findings: Lung volumes are normal.  No consolidative airspace disease.  No pleural effusions.  No pneumothorax.  No pulmonary nodule or mass noted.  Pulmonary vasculature and the cardiomediastinal  silhouette are within normal limits.  IMPRESSION: 1. No radiographic evidence of acute cardiopulmonary disease.   Original Report Authenticated By: Trudie Reed, M.D.    EKG: Independently reviewed. Sinus tachycardia with nonspecific ST-T changes in the inferior and lateral leads.  Assessment/Plan Principal Problem:   Palpitations Active Problems:   HYPERTENSION, ESSENTIAL NOS   Subaortic membrane   Atrial fibrillation   1. Palpitations with nonspecific EKG changes in the inferior lateral leads - cycle cardiac markers. May discuss with patient's  cardiologist in a.m. Per records patient had a Myoview in 2007 which was negative. Patient's recent thyroid function test was normal. 2. Hyperglycemia - check hemoglobin A1c. 3. A. Fib - presently rate controlled. Continue Cardizem and xarelto. 4. History of subvalvular aortic membrane - per cardiology. 5. History of alcoholism - states she has not had alcohol for more than a week now. Patient has been placed on thiamine. 6. History of drug abuse - denies having taken any cocaine recently for last 9 years. Smoked marijuana last week. Check drug screen. 7. Hypertension - continue home medications.    Code Status: Full code.  Family Communication: None.  Disposition Plan: Admit for observation.    Pecolia Marando N. [facility] Pager [phone].  If 7PM-7AM, please contact night-coverage [email or web address] Password [facility] 05/06/2013, 6:09 AM

## 2013-05-06 NOTE — ED Notes (Signed)
Aroura Vasudevan phone number is [phone], please call for a ride when needed.

## 2013-05-07 ENCOUNTER — Other Ambulatory Visit: Payer: Self-pay

## 2013-05-07 DIAGNOSIS — E1065 Type 1 diabetes mellitus with hyperglycemia: Secondary | ICD-10-CM | POA: Diagnosis present

## 2013-05-07 DIAGNOSIS — IMO0002 Reserved for concepts with insufficient information to code with codable children: Secondary | ICD-10-CM | POA: Diagnosis present

## 2013-05-07 DIAGNOSIS — F1420 Cocaine dependence, uncomplicated: Secondary | ICD-10-CM

## 2013-05-07 LAB — GLUCOSE, CAPILLARY
Glucose-Capillary: 133 mg/dL — ABNORMAL HIGH (ref 70–99)
Glucose-Capillary: 213 mg/dL — ABNORMAL HIGH (ref 70–99)

## 2013-05-07 LAB — BASIC METABOLIC PANEL
CO2: 27 mEq/L (ref 19–32)
GFR calc Af Amer: >90 mL/min (ref >90)
GFR calc non Af Amer: >90 mL/min (ref >90)

## 2013-05-07 LAB — CBC
HCT: 39.2 % (ref 36.0–46.0)
Hemoglobin: 13.6 g/dL (ref 12.0–15.0)
RBC: 4.19 MIL/uL (ref 3.87–5.11)

## 2013-05-07 LAB — URINE CULTURE: Culture: NO GROWTH

## 2013-05-07 MED ORDER — INSULIN ASPART 100 UNIT/ML ~~LOC~~ SOLN
0.0000 [IU] | Freq: Every day | SUBCUTANEOUS | Status: DC
  Administered 2013-05-07: 2 [IU] via SUBCUTANEOUS

## 2013-05-07 MED ORDER — INSULIN ASPART 100 UNIT/ML ~~LOC~~ SOLN
0.0000 [IU] | Freq: Three times a day (TID) | SUBCUTANEOUS | Status: DC
  Administered 2013-05-07: 1 [IU] via SUBCUTANEOUS
  Administered 2013-05-08: 2 [IU] via SUBCUTANEOUS
  Administered 2013-05-08: 3 [IU] via SUBCUTANEOUS
  Administered 2013-05-09: 2 [IU] via SUBCUTANEOUS

## 2013-05-07 MED ORDER — DISOPYRAMIDE PHOSPHATE ER 100 MG PO CP12
100.0000 mg | ORAL_CAPSULE | Freq: Two times a day (BID) | ORAL | Status: DC
  Administered 2013-05-07 (×2): 100 mg via ORAL
  Filled 2013-05-07 (×6): qty 1

## 2013-05-07 MED ORDER — SENNOSIDES-DOCUSATE SODIUM 8.6-50 MG PO TABS
2.0000 | ORAL_TABLET | Freq: Two times a day (BID) | ORAL | Status: DC | PRN
  Administered 2013-05-07: 2 via ORAL
  Filled 2013-05-07: qty 2

## 2013-05-07 MED ORDER — POLYETHYLENE GLYCOL 3350 17 G PO PACK
17.0000 g | PACK | Freq: Every day | ORAL | Status: DC | PRN
  Filled 2013-05-07: qty 1

## 2013-05-07 NOTE — Progress Notes (Signed)
[facility] PROGRESS NOTE  Jane Arellano ZOX:[id number] DOB: 1961-05-10 DOA: 05/06/2013 PCP: Standley Dakins, MD Brief HPI:  Jane Arellano is a 52 y.o. female who was recently admitted for A. fib with RVR and was placed on xarelto and Cardizem started experiencing palpitation last night around 11 PM. Patient states that she was taking her medications as advised.  Chest pain shortness of breath productive cough nausea vomiting. In the ER patient's EKG was showing sinus tachycardia with some ST changes in inferior lateral leads. On-call cardiologist Dr. Tresa Endo was consulted by ER physician. Cardiologist at this time is advised to admit and cycle cardiac markers.  Assessment/Plan:  1. Palpitations with nonspecific EKG changes in the inferior lateral leads - Cardiac enzymes are negative. Her symptoms impvoed today.  Repeat EKG IN AM.   Per records patient had a Myoview in 2007 which was negative. Patient's recent thyroid function test was normal.  2.  Hyperglycemia/ new Diabetes Mellitus -  hemoglobin A1c is 7.1 . Started her on SSI. She can be started on metformin at the time of discharge.    3.A. Fib - presently rate controlled and sinus.  Resume xarelto but change the cardizem to dysopyrimie   4. History of subvalvular aortic membrane - further management as per cardiology.  5. History of alcoholism - states she has not had alcohol for more than a week now. Patient has been placed on thiamine.  6. History of drug abuse - denies having taken any cocaine recently for last 9 years. Smoked marijuana last week.  drug screen negative.  7. Hypertension - continue home medications.  Code Status: full coHde.  Family Communication: family at bedside Disposition Plan: pending.    Consultants:  none  Procedures:  none  Antibiotics:  None.   HPI/Subjective: Reports havinga headche, and burning sensation in the epigastric area. Reports she started having the symptoms since she  started medications  on 6/12.   Objective: Filed Vitals:   05/06/13 1958 05/07/13 0454 05/07/13 1400 05/07/13 1433  BP: 154/83 131/83 167/93 132/98  Pulse: 84 73 70   Temp: 99 F (37.2 C) 98.9 F (37.2 C) 98.1 F (36.7 C)   TempSrc: Oral Oral Oral   Resp: 18 20 18    Height:      Weight:      SpO2: 99% 97% 99%     Intake/Output Summary (Last 24 hours) at 05/07/13 1548 Last data filed at 05/07/13 1300  Gross per 24 hour  Intake   1080 ml  Output    650 ml  Net    430 ml   Filed Weights   05/06/13 0832  Weight: 83.1 kg (183 lb 3.2 oz)    Exam:  Cardiovascular: S1-S2 heard.  Respiratory: No rhonchi or crepitations.  Abdomen: Soft nontender bowel sounds present.    Musculoskeletal: No edema.   Neurologic: Alert awake oriented to time place and person. Moves all extremities.   Data Reviewed: Basic Metabolic Panel:  Recent Labs Lab 05/01/13 1309 05/01/13 1745 05/02/13 0535 05/06/13 0204 05/07/13 0520  NA 141  --  138 139 138  K 3.7  --  3.6 3.4* 4.2  CL 103  --  100 104 98  CO2 22  --  23  --  27  GLUCOSE 200*  --  210* 210* 169*  BUN 8  --  9 11 11   CREATININE 0.45*  --  0.55 0.70 0.63  CALCIUM 10.0  --  9.7  --  10.1  MG  --  2.1  --   --   --    Liver Function Tests:  Recent Labs Lab 05/01/13 1309  AST 23  ALT 24  ALKPHOS 64  BILITOT 0.3  PROT 8.7*  ALBUMIN 4.2   No results found for this basename: LIPASE, AMYLASE,  in the last 168 hours No results found for this basename: AMMONIA,  in the last 168 hours CBC:  Recent Labs Lab 05/01/13 1309 05/02/13 0535 05/06/13 0158 05/06/13 0204 05/07/13 0520  WBC 6.4 6.2 5.8  --  5.4  NEUTROABS 5.2  --   --   --   --   HGB 13.9 13.7 13.3 13.6 13.6  HCT 39.4 38.8 38.0 40.0 39.2  MCV 94.3 94.4 93.4  --  93.6  PLT 171 194 189  --  206   Cardiac Enzymes:  Recent Labs Lab 05/01/13 2352 05/02/13 0533 05/06/13 1000 05/06/13 1454 05/06/13 2240  TROPONINI <0.30 <0.30 <0.30 <0.30 <0.30   BNP  (last 3 results)  Recent Labs  05/01/13 1309  PROBNP 283.8*   CBG: No results found for this basename: GLUCAP,  in the last 168 hours  Recent Results (from the past 240 hour(s))  MRSA PCR SCREENING     Status: None   Collection Time    05/01/13  4:34 PM      Result Value Range Status   MRSA by PCR NEGATIVE  NEGATIVE Final   Comment:            The GeneXpert MRSA Assay (FDA     approved for NASAL specimens     only), is one component of a     comprehensive MRSA colonization     surveillance program. It is not     intended to diagnose MRSA     infection nor to guide or     monitor treatment for     MRSA infections.  URINE CULTURE     Status: None   Collection Time    05/06/13  5:22 AM      Result Value Range Status   Specimen Description URINE, RANDOM   Final   Special Requests ADDED 0541   Final   Culture  Setup Time 05/06/2013 06:33   Final   Colony Count NO GROWTH   Final   Culture NO GROWTH   Final   Report Status 05/07/2013 FINAL   Final     Studies: Dg Chest 2 View  05/06/2013   *RADIOLOGY REPORT*  Clinical Data: Tachycardia.  CHEST - 2 VIEW  Comparison: Chest x-ray 12/21/2012.  Findings: Lung volumes are normal.  No consolidative airspace disease.  No pleural effusions.  No pneumothorax.  No pulmonary nodule or mass noted.  Pulmonary vasculature and the cardiomediastinal silhouette are within normal limits.  IMPRESSION: 1. No radiographic evidence of acute cardiopulmonary disease.   Original Report Authenticated By: Trudie Reed, M.D.    Scheduled Meds: . antiseptic oral rinse  15 mL Mouth Rinse Daily  . disopyramide  100 mg Oral Q12H  . ferrous sulfate  325 mg Oral Q breakfast  . omega-3 acid ethyl esters  1 g Oral Daily  . pantoprazole  40 mg Oral Q0600  . Rivaroxaban  20 mg Oral Q supper  . sertraline  100 mg Oral Daily  . sodium chloride  3 mL Intravenous Q12H  . sodium chloride  3 mL Intravenous Q12H  . thiamine  100 mg Oral Daily   Continuous  Infusions:  Principal Problem:   Palpitations Active Problems:   HYPERTENSION, ESSENTIAL NOS   Subaortic membrane   Atrial fibrillation    Time spent: 25 min    Coretha Creswell  [facility] Pager [phone]. If 7PM-7AM, please contact night-coverage at [email or web address], password [facility] 05/07/2013, 3:48 PM  LOS: 1 day

## 2013-05-07 NOTE — Telephone Encounter (Signed)
"  Not accepting calls at this time."

## 2013-05-07 NOTE — Progress Notes (Signed)
atient ID: Jane Arellano, female   DOB: 1961/08/20, 52 y.o.   MRN: [id number] I've reviewed patient's records. Her headache is probably secondary to the diltiazem. History of subaortic stenosis and hypertrophic cardiomyopathy, will start disopyramide 100 mg twice a day. This medication can help diastolic function as well as keep in normal sinus rhythm. Discussed with patient and she agrees with plan. Also discussed with Dr Blake Divine and she agrees as well. Check EKG in the morning and discharge home. 30 mins spent on patient care. PE is stable.

## 2013-05-07 NOTE — Progress Notes (Signed)
05/07/2013 4:58 PM Pt called RN to room stating that she is having a "side effect"  Of the new medication Norpace. Pt states that she is "having a dull feeling around the chest, I can't really describe it.." Pt is in NAD, eating dinner. VSS. Pt states "I just want a medication with no side effects."  Will continue to monitor closely. Darnita Woodrum, Avie Echevaria , RN

## 2013-05-08 DIAGNOSIS — F341 Dysthymic disorder: Secondary | ICD-10-CM

## 2013-05-08 DIAGNOSIS — R002 Palpitations: Secondary | ICD-10-CM

## 2013-05-08 DIAGNOSIS — F1010 Alcohol abuse, uncomplicated: Secondary | ICD-10-CM

## 2013-05-08 DIAGNOSIS — I480 Paroxysmal atrial fibrillation: Secondary | ICD-10-CM

## 2013-05-08 DIAGNOSIS — E119 Type 2 diabetes mellitus without complications: Secondary | ICD-10-CM

## 2013-05-08 DIAGNOSIS — I10 Essential (primary) hypertension: Secondary | ICD-10-CM

## 2013-05-08 LAB — GLUCOSE, CAPILLARY
Glucose-Capillary: 177 mg/dL — ABNORMAL HIGH (ref 70–99)
Glucose-Capillary: 153 mg/dL — ABNORMAL HIGH (ref 70–99)

## 2013-05-08 MED ORDER — HYDRALAZINE HCL 20 MG/ML IJ SOLN
10.0000 mg | INTRAMUSCULAR | Status: DC | PRN
  Administered 2013-05-08: 10 mg via INTRAVENOUS
  Filled 2013-05-08: qty 1

## 2013-05-08 MED ORDER — METOPROLOL TARTRATE 25 MG PO TABS
25.0000 mg | ORAL_TABLET | Freq: Two times a day (BID) | ORAL | Status: DC
  Administered 2013-05-08 – 2013-05-09 (×3): 25 mg via ORAL
  Filled 2013-05-08 (×4): qty 1

## 2013-05-08 MED ORDER — AMLODIPINE BESYLATE 5 MG PO TABS
5.0000 mg | ORAL_TABLET | Freq: Every day | ORAL | Status: DC
  Administered 2013-05-08 – 2013-05-09 (×2): 5 mg via ORAL
  Filled 2013-05-08 (×2): qty 1

## 2013-05-08 NOTE — Telephone Encounter (Signed)
Pt is not accepting calls at this time.

## 2013-05-08 NOTE — Progress Notes (Signed)
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  She believes this is related to the norpace.  Ss are the same as when she was on dilt.  Maintaining sinus.  CURRENT MEDS . antiseptic oral rinse  15 mL Mouth Rinse Daily  . disopyramide  100 mg Oral Q12H  . ferrous sulfate  325 mg Oral Q breakfast  . insulin aspart  0-5 Units Subcutaneous QHS  . insulin aspart  0-9 Units Subcutaneous TID WC  . omega-3 acid ethyl esters  1 g Oral Daily  . pantoprazole  40 mg Oral Q0600  . Rivaroxaban  20 mg Oral Q supper  . sertraline  100 mg Oral Daily  . sodium chloride  3 mL Intravenous Q12H  . sodium chloride  3 mL Intravenous Q12H  . thiamine  100 mg Oral Daily   OBJECTIVE  Filed Vitals:   05/07/13 2129 05/08/13 0100 05/08/13 0615 05/08/13 0645  BP: 184/98 154/83 169/106 189/114  Pulse: 73 96 77   Temp: 98.4 F (36.9 C)  98.4 F (36.9 C)   TempSrc: Oral  Oral   Resp: 18     Height:      Weight:      SpO2: 100% 100% 100%     Intake/Output Summary (Last 24 hours) at 05/08/13 0715 Last data filed at 05/07/13 1700  Gross per 24 hour  Intake   1200 ml  Output    650 ml  Net    550 ml   Filed Weights   05/06/13 0832  Weight: 183 lb 3.2 oz (83.1 kg)    PHYSICAL EXAM  General: Pleasant, NAD. Neuro: Alert and oriented X 3. Moves all extremities spontaneously. Psych: Normal affect. HEENT:  Normal  Neck: Supple without JVD. Lungs:  Resp regular and unlabored, CTA. Heart: RRR 3/6 sem throughout, no s3, s4. Abdomen: Soft, non-tender, non-distended, BS + x 4.  Extremities: No clubbing, cyanosis or edema. DP/PT/Radials 2+ and  equal bilaterally.  Accessory Clinical Findings  CBC  Recent Labs  05/06/13 0158 05/06/13 0204 05/07/13 0520  WBC 5.8  --  5.4  HGB 13.3 13.6 13.6  HCT 38.0 40.0 39.2  MCV 93.4  --  93.6  PLT 189  --  206   Basic Metabolic Panel  Recent Labs  05/06/13 0204 05/07/13 0520  NA 139 138  K 3.4* 4.2  CL 104 98  CO2  --  27  GLUCOSE 210* 169*  BUN 11 11  CREATININE 0.70 0.63  CALCIUM  --  10.1   Cardiac Enzymes  Recent Labs  05/06/13 1000 05/06/13 1454 05/06/13 2240  TROPONINI <0.30 <0.30 <0.30   D-Dimer  Recent Labs  05/06/13 0227  DDIMER <0.27   Hemoglobin A1C  Recent Labs  05/06/13 1000  HGBA1C 7.1*   TELE  Rsr, occasional sinus tach.  Radiology/Studies  Dg Chest 2 View  05/06/2013   *RADIOLOGY REPORT*  Clinical Data: Tachycardia.  CHEST - 2 VIEW  Comparison: Chest x-ray 12/21/2012.  Findings: Lung volumes are normal.  No consolidative airspace disease.  No pleural effusions.  No pneumothorax.  No pulmonary nodule or mass noted.  Pulmonary vasculature  and the cardiomediastinal silhouette are within normal limits.  IMPRESSION: 1. No radiographic evidence of acute cardiopulmonary disease.   Original Report Authenticated By: Trudie Reed, M.D.   ASSESSMENT AND PLAN  1.  Palpitations:  Maintaining sinus.    2.  Headaches/Chest burning:  Pt reports that these Ss started after being placed on diltiazem and have persisted with switch to norpace.  When she presented earlier this month, she was initially placed on both dilt and bb.  When she converted to sinus, bb was d/c'd.  D/c norpace and resume bb therapy to see if this makes a difference in her Ss.  3.  PAF:  Maintaining sinus.  ? Tolerance of dilt/norpace.  Switch to bb.  Cont xarelto.  4.  Subaortic membrane:  Echo 6/12 showed nl LV fxn w/o significant AS/AI.  Mean LVOT gradient has increased from in 2011 to last week.  Peak gradient of .  Literature review suggest mean gradient  >24mmHg may warrant surgical correction.  Will d/w Dr. Graciela Husbands as her degree of LVOT obstruction has increased relatively dramatically over the past 3 yrs.  5.  ETOH/Marijuana Abuse:  Cessation advised.  6.  HTN:  In setting of d/c'ing dilt yesterday will resume norvasc as she had good bp control on it last admission.  Adding bb.  Follow.  Signed, Nicolasa Ducking NP  Disopyramide was used because of hx of HCM; but review of echo does not confirm that diagnosis  As this was hre first episode of AFib I would be inclined to anticoagulate, use rate control avoid antiarrhythmics  Will prob need CVTS consult as gradient >30 which tends to be assoc with worseing post operative AI o r need for repeat surgery Would avoid augmented rate control has HR 50-60  Consider discharge with close PCP followup for BP and HR

## 2013-05-08 NOTE — Progress Notes (Signed)
Inpatient Diabetes Program Recommendations  AACE/ADA: New Consensus Statement on Inpatient Glycemic Control (2013)  Target Ranges:  Prepandial:   less than 140 mg/dL      Peak postprandial:   less than 180 mg/dL (1-2 hours)      Critically ill patients:  140 - 180 mg/dL   Reason for Visit: Newly-diagnosed DM  Pt states she eats healthy at home, just needs to watch portion sizes.  Discussed diagnosis of DM, importance of monitoring blood sugars, healthy eating, exercise and HgbA1C results and goal.  Pt has family hx DM, and has been to [facility] (Dr. Laural Benes).  Does not want OP Diabetes Ed at this point.  Asks appropriate questions and gave info regarding diet, basics of Type 2 DM.  Pt to watch diabetes videos on pt ed channel. Results for Jane Arellano, Jane Arellano (MRN [id number]) as of 05/08/2013 16:08  Ref. Range 05/07/2013 16:36 05/07/2013 21:34 05/08/2013 06:20 05/08/2013 07:16  Glucose-Capillary Latest Range: 70-99 mg/dL 119 (H) 147 (H) 829 (H)   EKG 12-LEAD No range found    Rpt  Results for [name], [name] (MRN [id number]) as of 05/08/2013 16:08  Ref. Range 05/06/2013 10:00  Hemoglobin A1C Latest Range: <5.7 % 7.1 (H)    Inpatient Diabetes Program Recommendations Oral Agents: Begin metformin 500 bid at discharge HgbA1C: 7.1% newly-diagnosed DM  Will f/u in am for any additional questions/concerns.  Thank you. Ailene Ards, RD, LDN, CDE Inpatient Diabetes Coordinator [phone]

## 2013-05-08 NOTE — Progress Notes (Signed)
[facility] PROGRESS NOTE  JIN SHOCKLEY ZOX:[id number] DOB: 01-Mar-1961 DOA: 05/06/2013 PCP: Standley Dakins, MD  Assessment/Plan:  1. Palpitations with nonspecific EKG changes in the inferior lateral leads - Cardiac enzymes are negative. Her symptoms impvoed today.  Repeat EKG IN AM show sinus rhythm. Will continue treatment with b-blocker   2.  Hyperglycemia/ new Diabetes Mellitus -  hemoglobin A1c is 7.1 . Will conitnue SSI while inpatient. Will discharge on metformin  3.Paroxysmal A. Fib: presently rate controlled and sinus.  Will continue xarelto but change dysopyrimine to metoprolol per cardiology rec's,  4. History of subvalvular aortic membrane - further management as per cardiology. LVOT 42%, will follow recommendations and control BP.   5. Depression: will continue sertraline  6. History of drug abuse - denies having taken any cocaine recently for last 9 years or using any further alcohol. Counseling has been provided. Will continue thiamine and folic acid.  7. Hypertension -BP is not well controlled. Will follow VS after been started on metoprolol and norvasc. Continue heart healthy diet and PRN hydralazine.  Code Status: full coHde.  Family Communication: family at bedside Disposition Plan: home in 1-2 days  Consultants:  cardiology  Procedures:  none  Antibiotics:  None.   HPI/Subjective: Reports having headache; denies SOB or CP at  This moment  Objective: Filed Vitals:   05/08/13 0100 05/08/13 0615 05/08/13 0645 05/08/13 1023  BP: 154/83 169/106 189/114 152/78  Pulse: 96 77    Temp:  98.4 F (36.9 C)    TempSrc:  Oral    Resp:      Height:      Weight:      SpO2: 100% 100%      Intake/Output Summary (Last 24 hours) at 05/08/13 1042 Last data filed at 05/07/13 1700  Gross per 24 hour  Intake    720 ml  Output      0 ml  Net    720 ml   Filed Weights   05/06/13 0832  Weight: 83.1 kg (183 lb 3.2 oz)    Exam: General: AAOx3;  afebrile; NAD Cardiovascular: S1-S2, positive SEM, no rubs  Respiratory: No rhonchi or crackles; good air movement. Abdomen: Soft nontender bowel sounds present.   Musculoskeletal: No edema.   Neurologic: Alert awake oriented to time place and person. Moves all extremities.   Data Reviewed: Basic Metabolic Panel:  Recent Labs Lab 05/01/13 1309 05/01/13 1745 05/02/13 0535 05/06/13 0204 05/07/13 0520  NA 141  --  138 139 138  K 3.7  --  3.6 3.4* 4.2  CL 103  --  100 104 98  CO2 22  --  23  --  27  GLUCOSE 200*  --  210* 210* 169*  BUN 8  --  9 11 11   CREATININE 0.45*  --  0.55 0.70 0.63  CALCIUM 10.0  --  9.7  --  10.1  MG  --  2.1  --   --   --    Liver Function Tests:  Recent Labs Lab 05/01/13 1309  AST 23  ALT 24  ALKPHOS 64  BILITOT 0.3  PROT 8.7*  ALBUMIN 4.2   CBC:  Recent Labs Lab 05/01/13 1309 05/02/13 0535 05/06/13 0158 05/06/13 0204 05/07/13 0520  WBC 6.4 6.2 5.8  --  5.4  NEUTROABS 5.2  --   --   --   --   HGB 13.9 13.7 13.3 13.6 13.6  HCT 39.4 38.8 38.0 40.0 39.2  MCV 94.3  94.4 93.4  --  93.6  PLT 171 194 189  --  206   Cardiac Enzymes:  Recent Labs Lab 05/01/13 2352 05/02/13 0533 05/06/13 1000 05/06/13 1454 05/06/13 2240  TROPONINI <0.30 <0.30 <0.30 <0.30 <0.30   BNP (last 3 results)  Recent Labs  05/01/13 1309  PROBNP 283.8*   CBG:  Recent Labs Lab 05/07/13 1636 05/07/13 2134 05/08/13 0620  GLUCAP 133* 213* 177*    Recent Results (from the past 240 hour(s))  MRSA PCR SCREENING     Status: None   Collection Time    05/01/13  4:34 PM      Result Value Range Status   MRSA by PCR NEGATIVE  NEGATIVE Final   Comment:            The GeneXpert MRSA Assay (FDA     approved for NASAL specimens     only), is one component of a     comprehensive MRSA colonization     surveillance program. It is not     intended to diagnose MRSA     infection nor to guide or     monitor treatment for     MRSA infections.  URINE CULTURE      Status: None   Collection Time    05/06/13  5:22 AM      Result Value Range Status   Specimen Description URINE, RANDOM   Final   Special Requests ADDED 0541   Final   Culture  Setup Time 05/06/2013 06:33   Final   Colony Count NO GROWTH   Final   Culture NO GROWTH   Final   Report Status 05/07/2013 FINAL   Final     Studies: No results found.  Scheduled Meds: . amLODipine  5 mg Oral Daily  . antiseptic oral rinse  15 mL Mouth Rinse Daily  . ferrous sulfate  325 mg Oral Q breakfast  . insulin aspart  0-5 Units Subcutaneous QHS  . insulin aspart  0-9 Units Subcutaneous TID WC  . metoprolol tartrate  25 mg Oral BID  . omega-3 acid ethyl esters  1 g Oral Daily  . pantoprazole  40 mg Oral Q0600  . Rivaroxaban  20 mg Oral Q supper  . sertraline  100 mg Oral Daily  . sodium chloride  3 mL Intravenous Q12H  . sodium chloride  3 mL Intravenous Q12H  . 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 If 7PM-7AM, please contact night-coverage at [email or web address], password [facility] 05/08/2013, 10:42 AM  LOS: 2 days

## 2013-05-09 DIAGNOSIS — I4891 Unspecified atrial fibrillation: Secondary | ICD-10-CM

## 2013-05-09 DIAGNOSIS — Q244 Congenital subaortic stenosis: Secondary | ICD-10-CM

## 2013-05-09 LAB — GLUCOSE, CAPILLARY: Glucose-Capillary: 182 mg/dL — ABNORMAL HIGH (ref 70–99)

## 2013-05-09 MED ORDER — AMLODIPINE BESYLATE 5 MG PO TABS: 5.0000 mg | ORAL_TABLET | Freq: Every day | ORAL | Status: DC

## 2013-05-09 MED ORDER — METFORMIN HCL 500 MG PO TABS: 500.0000 mg | ORAL_TABLET | Freq: Two times a day (BID) | ORAL | Status: DC

## 2013-05-09 MED ORDER — GLUCOSE BLOOD VI STRP: ORAL_STRIP | Status: DC

## 2013-05-09 MED ORDER — FREESTYLE SYSTEM KIT: 1.0000 | PACK | Status: DC | PRN

## 2013-05-09 MED ORDER — THERA VITAL M PO TABS: 1.0000 | ORAL_TABLET | Freq: Every day | ORAL | Status: DC

## 2013-05-09 MED ORDER — PANTOPRAZOLE SODIUM 40 MG PO TBEC: 40.0000 mg | DELAYED_RELEASE_TABLET | Freq: Every day | ORAL | Status: DC

## 2013-05-09 MED ORDER — LIVING WELL WITH DIABETES BOOK
Freq: Once | Status: DC
  Filled 2013-05-09 (×2): qty 1

## 2013-05-09 MED ORDER — METOPROLOL TARTRATE 25 MG PO TABS: 25.0000 mg | ORAL_TABLET | Freq: Two times a day (BID) | ORAL | Status: DC

## 2013-05-09 MED ORDER — FREESTYLE LANCETS MISC: Status: DC

## 2013-05-09 NOTE — Discharge Summary (Signed)
Physician Discharge Summary  Jane Arellano ZOX:[id number] DOB: 1961-10-11 DOA: 05/06/2013  PCP: Standley Dakins, MD  Admit date: 05/06/2013 Discharge date: 05/09/2013  Time spent: >30 minutes  Recommendations for Outpatient Follow-up:  1. Reassess CBG's and adjust hypoglycemic agents 2. BMET to follow electrolytes and renal function 3. 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 Discharge home. Patient will follow with cardiology as an outpatient. Advise to stop using recreational substances.  Diet recommendation: heart healthy diet and low carb diet  Filed Weights   05/06/13 0832  Weight: 83.1 kg (183 lb 3.2 oz)    History of present illness:  52 y.o. female who was recently admitted for A. fib with RVR and was placed on xarelto and Cardizem started experiencing palpitation last night around 11 PM. Patient states that she was taking her medications as advised.  Patient denies chest pain, shortness of breath, productive cough, nausea and vomiting. In the ER patient's EKG was showing sinus tachycardia with some ST changes in inferior lateral leads. On-call cardiologist Dr. Tresa Endo was consulted by ER physician. Cardiologist at this time is advised to admit and cycle cardiac markers. Patient at this time denies any chest pain or shortness of breath on my exam. Palpitations have resolved.   Hospital Course:  1. Palpitations with nonspecific EKG changes in the inferior lateral leads - Cardiac enzymes are negative. Her symptoms improved/resolved today. Repeat EKG IN AM show sinus rhythm. Will continue treatment with b-blocker and xarelto as recommended by  cardiology. (See below). No CP or SOB  2. Hyperglycemia/ new Diabetes Mellitus - hemoglobin A1c is 7.1 . Discharge on metformin. Will need close follow up and further medication adjustemnts. Advise to follow low carb diet  3.Paroxysmal A. Fib: presently rate controlled and sinus. Will continue xarelto but change dysopyrimine to metoprolol per cardiology rec's. Patient will follow with cardiology as an outpatient for further medication adjustment and treatment.  4. History of subvalvular aortic membrane - further management as per cardiology. LVOT 42%, will need CVTS consult and probably surgery as an outpatient. Will control BP.   5. Depression: will continue sertraline   6. History of drug abuse - denies having taken any cocaine recently for last 9 years or using any further alcohol. UDS positive for marijuana. Counseling was provided and patient congratulated for quitting cocaine.    7. Hypertension -BP is better controlled at discharge but still not a goal. Will follow with PCP for further adjustments as an outpatient. Has been started on metoprolol and norvasc. Advise to follow low sodium heart healthy diet.   Procedures:  See below for x-ray reports  Consultations:  cardiology  Discharge Exam: Filed Vitals:   05/08/13 1023 05/08/13 1315 05/08/13 2047 05/09/13 0300  BP: 152/78 150/76 172/99 151/90  Pulse:  66 65 68  Temp:  98.1 F (36.7 C) 98.7 F (37.1 C) 98.8 F (37.1 C)  TempSrc:  Oral Oral Oral  Resp:  18 18 18   Height:      Weight:      SpO2:  99% 100% 100%   General: AAOx3; afebrile; NAD  Cardiovascular: S1-S2, positive SEM, no rubs  Respiratory: No rhonchi or crackles; good air movement.  Abdomen: Soft nontender bowel sounds present.  Musculoskeletal: No edema.  Neurologic: Alert awake oriented to time place and person. No focal deficit   Discharge Instructions  Discharge Orders   Future Appointments Provider Department Dept Phone   05/16/2013 2:20 PM Beatrice Lecher, PA-C  [facility] Main Office [place]) [phone]   Future Orders Complete By Expires     Diet - low sodium heart healthy  As directed     Discharge instructions  As directed     Comments:      Keep yourself hydrated Take medications as prescribed Follow a low carb diet Arrange follow up with PCP in 10 days        Medication List    STOP taking these medications       diltiazem 240 MG 24 hr capsule  Commonly known as:  CARDIZEM CD      TAKE these medications       amLODipine 5 MG tablet  Commonly known as:  NORVASC  Take 1 tablet (5 mg total) by mouth daily.     antiseptic oral rinse Liqd  15 mLs by Mouth Rinse route daily.     Calcium-Vitamin D3 500-400 MG-UNIT Tabs  Take 1 tablet by mouth daily.     ferrous sulfate 325 (65 FE) MG tablet  Take 325 mg by mouth daily with breakfast.     fish oil-omega-3 fatty acids 1000 MG capsule  Take 1 g by mouth daily.     freestyle lancets  Use as instructed     furosemide 20 MG tablet  Commonly known as:  LASIX  Take 1 tablet (20 mg total) by mouth daily.     glucose blood test strip  Use as instructed     glucose monitoring kit monitoring kit  1 each by Does not apply route as needed for other.     metFORMIN 500 MG tablet  Commonly known as:  GLUCOPHAGE  Take 1 tablet (500 mg total) by mouth 2 (two) times daily with a meal.     metoprolol tartrate 25 MG tablet  Commonly known as:  LOPRESSOR  Take 1 tablet (25 mg total) by mouth 2 (two) times daily.     multivitamin tablet  Take 1 tablet by mouth daily.     MULTIVITAMIN PO  Take 1 tablet by mouth daily.     pantoprazole 40 MG tablet  Commonly known as:  PROTONIX  Take 1 tablet (40 mg total) by mouth daily at 6 (six) AM.     potassium chloride 10 MEQ tablet  Commonly known as:  K-DUR,KLOR-CON  Take 1 tablet (10 mEq total) by mouth daily.     Rivaroxaban 20 MG Tabs  Commonly known as:  XARELTO  Take 1 tablet (20 mg total) by mouth daily  with supper.     sertraline 100 MG tablet  Commonly known as:  ZOLOFT  Take 0.5 tab po daily for 1 week, then take 1 po daily       Allergies  Allergen Reactions  . Ketorolac Tromethamine Other (See Comments)    Unknown reaction       Follow-up Information   Follow up with Standley Dakins, MD In 10 days.   Contact information:   [address] [place] [address] [phone]       Follow up with Sherryl Manges, MD. (call office to set appointment)    Contact information:   [address] [place] [address] [phone]  The results of significant diagnostics from this hospitalization (including imaging, microbiology, ancillary and laboratory) are listed below for reference.    Significant Diagnostic Studies: Dg Chest 2 View  05/06/2013   *RADIOLOGY REPORT*  Clinical Data: Tachycardia.  CHEST - 2 VIEW  Comparison: Chest x-ray 12/21/2012.  Findings: Lung volumes are normal.  No consolidative airspace disease.  No pleural effusions.  No pneumothorax.  No pulmonary nodule or mass noted.  Pulmonary vasculature and the cardiomediastinal silhouette are within normal limits.  IMPRESSION: 1. No radiographic evidence of acute cardiopulmonary disease.   Original Report Authenticated By: Trudie Reed, M.D.    Microbiology: Recent Results (from the past 240 hour(s))  MRSA PCR SCREENING     Status: None   Collection Time    05/01/13  4:34 PM      Result Value Range Status   MRSA by PCR NEGATIVE  NEGATIVE Final   Comment:            The GeneXpert MRSA Assay (FDA     approved for NASAL specimens     only), is one component of a     comprehensive MRSA colonization     surveillance program. It is not     intended to diagnose MRSA     infection nor to guide or     monitor treatment for     MRSA infections.  URINE CULTURE     Status: None   Collection Time    05/06/13  5:22 AM      Result Value Range Status   Specimen Description URINE,  RANDOM   Final   Special Requests ADDED 0541   Final   Culture  Setup Time 05/06/2013 06:33   Final   Colony Count NO GROWTH   Final   Culture NO GROWTH   Final   Report Status 05/07/2013 FINAL   Final     Labs: Basic Metabolic Panel:  Recent Labs Lab 05/06/13 0204 05/07/13 0520  NA 139 138  K 3.4* 4.2  CL 104 98  CO2  --  27  GLUCOSE 210* 169*  BUN 11 11  CREATININE 0.70 0.63  CALCIUM  --  10.1   CBC:  Recent Labs Lab 05/06/13 0158 05/06/13 0204 05/07/13 0520  WBC 5.8  --  5.4  HGB 13.3 13.6 13.6  HCT 38.0 40.0 39.2  MCV 93.4  --  93.6  PLT 189  --  206   Cardiac Enzymes:  Recent Labs Lab 05/06/13 1000 05/06/13 1454 05/06/13 2240  TROPONINI <0.30 <0.30 <0.30   BNP: BNP (last 3 results)  Recent Labs  05/01/13 1309  PROBNP 283.8*   CBG:  Recent Labs Lab 05/07/13 2134 05/08/13 0620 05/08/13 1711 05/08/13 2148 05/09/13 0614  GLUCAP 213* 177* 153* 134* 182*     Signed:  Sabastian Raimondi  [facility] 05/09/2013, 11:41 AM

## 2013-05-09 NOTE — Progress Notes (Signed)
Patient: Jane Arellano Date of Encounter: 05/09/2013, 7:17 AM Admit date: 05/06/2013     Subjective  [name] has no complaints this AM. She denies CP, SOB or palpitations. She has been ambulating in the room and hall without difficutly. She is eager to go home.   Objective  Physical Exam: Vitals: BP 151/90  Pulse 68  Temp(Src) 98.8 F (37.1 C) (Oral)  Resp 18  Ht 5' 6.5" (1.689 m)  Wt 183 lb 3.2 oz (83.1 kg)  BMI 29.13 kg/m2  SpO2 100% General: Well developed, well appearing 52 year old female in no acute distress. Neck: Supple. JVD not elevated. Lungs: Clear bilaterally to auscultation without wheezes, rales, or rhonchi. Breathing is unlabored. Heart: RRR S1 S2 with III/VI systolic murmur. No rub, S3 or S4.  Abdomen: Soft, non-distended. Extremities: No clubbing or cyanosis. No edema.  Distal pedal pulses are 2+ and equal bilaterally. Neuro: Alert and oriented X 3. Moves all extremities spontaneously. No focal deficits.  Intake/Output:  Intake/Output Summary (Last 24 hours) at 05/09/13 0717 Last data filed at 05/08/13 1900  Gross per 24 hour  Intake   1080 ml  Output      0 ml  Net   1080 ml    Inpatient Medications:  . amLODipine  5 mg Oral Daily  . antiseptic oral rinse  15 mL Mouth Rinse Daily  . ferrous sulfate  325 mg Oral Q breakfast  . insulin aspart  0-5 Units Subcutaneous QHS  . insulin aspart  0-9 Units Subcutaneous TID WC  . metoprolol tartrate  25 mg Oral BID  . omega-3 acid ethyl esters  1 g Oral Daily  . pantoprazole  40 mg Oral Q0600  . Rivaroxaban  20 mg Oral Q supper  . sertraline  100 mg Oral Daily  . sodium chloride  3 mL Intravenous Q12H  . sodium chloride  3 mL Intravenous Q12H  . thiamine  100 mg Oral Daily    Labs:  Recent Labs  05/07/13 0520  NA 138  K 4.2  CL 98  CO2 27  GLUCOSE 169*  BUN 11  CREATININE 0.63  CALCIUM 10.1    Recent Labs  05/07/13 0520  WBC 5.4  HGB 13.6  HCT 39.2  MCV 93.6  PLT 206     Recent Labs  05/06/13 1000 05/06/13 1454 05/06/13 2240  TROPONINI <0.30 <0.30 <0.30    Recent Labs  05/06/13 1000  HGBA1C 7.1*    Radiology/Studies: Dg Chest 2 View  05/06/2013   *RADIOLOGY REPORT*  Clinical Data: Tachycardia.  CHEST - 2 VIEW  Comparison: Chest x-ray 12/21/2012.  Findings: Lung volumes are normal.  No consolidative airspace disease.  No pleural effusions.  No pneumothorax.  No pulmonary nodule or mass noted.  Pulmonary vasculature and the cardiomediastinal silhouette are within normal limits.  IMPRESSION: 1. No radiographic evidence of acute cardiopulmonary disease.   Original Report Authenticated By: Trudie Reed, M.D.    Telemetry: SR    Assessment and Plan  1. PAF: Maintaining SR. Cont rate control with low dose metoprolol. Cont Xarelto.  2. Subaortic membrane: Echo 6/12 showed nl LV fxn w/o significant AS/AI. Mean LVOT gradient has increased from 21 mmHg in 2011 to 42 mmHg last week. Peak gradient of 81 mmHg. Will need CVTS consult as gradient >30. 3. ETOH/marijuana abuse: Cessation advised.  4. HTN  Dr. Graciela Husbands to see Signed, EDMISTEN, [place]  Ok from our perspective to go home We will  arrange outpt consultation about surgery for subaortic membrane

## 2013-05-10 LAB — DRUG SCREEN PANEL (SERUM)

## 2013-05-13 ENCOUNTER — Telehealth: Payer: Self-pay | Admitting: Physician Assistant

## 2013-05-13 MED ORDER — DABIGATRAN ETEXILATE MESYLATE 150 MG PO CAPS: 150.0000 mg | ORAL_CAPSULE | Freq: Two times a day (BID) | ORAL | Status: DC

## 2013-05-13 NOTE — Telephone Encounter (Signed)
Dr. Graciela Husbands DOD recommends for pt to stop Xarelto 20 mg and start Pradaxa 150 mg twice a day, and to give samples to see if pt can tolerate this medication. 12 days (24 CAPSULES) Pradaxa 150 mg capsules placed at the front desk for pt . Patient aware.

## 2013-05-13 NOTE — Telephone Encounter (Signed)
New Prob      Pt states she is experiencing diarrhea, some bleeding in her gums, headache, nausiated, and she feels her body temp is high. States she feels this may be a side effect from one her medications. Would like to speak to nurse.

## 2013-05-13 NOTE — Telephone Encounter (Signed)
Pt. States was prescribed Xarelto 20 mg in the hospital on 05/02/13 when she was in the hospital. Pt states has been having side effects since she started taken this medication: diarrhea, nauseas, headache and gums bleeding. Pt has a post- hospital visit on 05/16/13 with Tereso Newcomer PA.

## 2013-05-14 ENCOUNTER — Encounter: Payer: Self-pay | Admitting: Family Medicine

## 2013-05-16 ENCOUNTER — Ambulatory Visit (INDEPENDENT_AMBULATORY_CARE_PROVIDER_SITE_OTHER): Payer: No Typology Code available for payment source | Admitting: Physician Assistant

## 2013-05-16 ENCOUNTER — Encounter: Payer: Self-pay | Admitting: Physician Assistant

## 2013-05-16 VITALS — BP 180/110 | HR 68 | Ht 66.5 in | Wt 189.0 lb

## 2013-05-16 DIAGNOSIS — I10 Essential (primary) hypertension: Secondary | ICD-10-CM

## 2013-05-16 DIAGNOSIS — I4891 Unspecified atrial fibrillation: Secondary | ICD-10-CM

## 2013-05-16 DIAGNOSIS — Q244 Congenital subaortic stenosis: Secondary | ICD-10-CM

## 2013-05-16 DIAGNOSIS — E119 Type 2 diabetes mellitus without complications: Secondary | ICD-10-CM

## 2013-05-16 DIAGNOSIS — F1010 Alcohol abuse, uncomplicated: Secondary | ICD-10-CM

## 2013-05-16 MED ORDER — AMLODIPINE BESYLATE 5 MG PO TABS: 10.0000 mg | ORAL_TABLET | Freq: Every day | ORAL | Status: DC

## 2013-05-16 NOTE — Patient Instructions (Addendum)
Your physician has recommended you make the following change in your medication:  1. Increase Amlodipine (10mg ) daily, gave you new script today 2. Gave you samples of Pradaxa today along with paperwork to fill out for medication assistance with Pradaxa  Your physician recommends that you keep your follow-up appointment with Tereso Newcomer, PA-C Tuesday, July 22 @ 2:20pm

## 2013-05-16 NOTE — Progress Notes (Signed)
[address] [place], [place]  [address] Phone: [phone] Fax:  [phone]  Date:  05/16/2013   ID:  Jane Arellano, DOB 01-08-1961, MRN [id number]  PCP:  Standley Dakins, MD  Cardiologist:  Dr. Sherryl Manges     History of Present Illness: Jane Arellano is a 52 y.o. female who returns for follow up after 2 recent hospitalizations. 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  She was admitted 6/11-6/12 with atrial fibrillation with RVR. She was treated with rate control and spontaneously converted to NSR. She was placed on Xarelto for anticoagulation. 2-D echo 05/02/13:  Mild LVH, EF 60-65%, grade 1 diastolic dysfunction, subaortic stenosis, mean gradient 42, peak gradient 81 mild LAE, mild RAE.  There was a considerable increase in her gradient. She returned to the hospital 6/16-6/19 with palpitations. Patient remained in sinus rhythm. Cardiac markers remained negative.  She was placed on disopyramide for a brief amount time but this was changed to metoprolol. Given her significant increase in LVOT gradient, outpatient consultation with [facility] was to be arranged.  Since discharge, she has felt well. She did have side effects to the Xarelto. She was switched to Pradaxa 150 twice a day. Her side effects have resolved. She denies chest pain, palpitations, syncope, shortness of breath. She denies orthopnea, PND or edema. She admits to compliance with medications. She has stopped drinking alcohol.  Labs (6/14):  K 4.2, Cr 0.63, ALT 24, LDL 64, Hgb 13.6, TSH 1.178   Wt Readings from Last 3 Encounters:  05/06/13 183 lb 3.2 oz (83.1 kg)  05/02/13 186 lb 4.6 oz (84.5 kg)  05/01/13 192 lb (87.091 kg)     Past Medical History  Diagnosis Date  . Atypical chest pain     a. 11/2005 Negative Myoview  . Subaortic membrane     a. 01/2010 Echo: EF 55-60%, No rwma, subaortic membrane with elevated LVOT mean gradient of 21 mmHg,  Triv AI, mod dil LA, mildly increased PASP. b. 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 GERD (gastroesophageal reflux disease)   . HTN (hypertension)   . Iron deficiency anemia   . History of substance abuse   . Paroxysmal atrial fibrillation 05/01/2013    On Xarelto  . Diastolic dysfunction   . Heart murmur   . DM2 (diabetes mellitus, type 2)     Current Outpatient Prescriptions  Medication Sig Dispense Refill  . amLODipine (NORVASC) 5 MG tablet Take 1 tablet (5 mg total) by mouth daily.  30 tablet  1  . antiseptic oral rinse (BIOTENE) LIQD 15 mLs by Mouth Rinse route daily.      . Calcium Carb-Cholecalciferol (CALCIUM-VITAMIN D3) 500-400 MG-UNIT TABS Take 1 tablet by mouth daily.      . dabigatran (PRADAXA) 150 MG CAPS Take 1 capsule (150 mg total) by mouth every 12 (twelve) hours.  60 capsule    . ferrous sulfate 325 (65 FE) MG tablet Take 325 mg by mouth daily with breakfast.      . fish oil-omega-3 fatty acids 1000 MG capsule Take 1 g by mouth daily.      . furosemide (LASIX) 20 MG tablet Take 1 tablet (20 mg total) by mouth daily.  30 tablet  3  . glucose blood test strip Use  as instructed  100 each  12  . glucose monitoring kit (FREESTYLE) monitoring kit 1 each by Does not apply route as needed for other.  1 each  0  . Lancets (FREESTYLE) lancets Use as instructed  100 each  12  . metFORMIN (GLUCOPHAGE) 500 MG tablet Take 1 tablet (500 mg total) by mouth 2 (two) times daily with a meal.  60 tablet  1  . metoprolol tartrate (LOPRESSOR) 25 MG tablet Take 1 tablet (25 mg total) by mouth 2 (two) times daily.  60 tablet  1  . Multiple Vitamins-Minerals (MULTIVITAMIN PO) Take 1 tablet by mouth daily.      . Multiple Vitamins-Minerals (MULTIVITAMIN) tablet Take 1 tablet by mouth daily.      . potassium chloride  (K-DUR,KLOR-CON) 10 MEQ tablet Take 1 tablet (10 mEq total) by mouth daily.  30 tablet  3  . sertraline (ZOLOFT) 100 MG tablet Take 0.5 tab po daily for 1 week, then take 1 po daily  30 tablet  3   No current facility-administered medications for this visit.    Allergies:    Allergies  Allergen Reactions  . Ketorolac Tromethamine Other (See Comments)    Unknown reaction    Social History:  The patient  reports that she has never smoked. Her smokeless tobacco use includes Snuff. She reports that  drinks alcohol. She reports that she uses illicit drugs (Marijuana).   ROS:  Please see the history of present illness.      All other systems reviewed and negative.   PHYSICAL EXAM: VS:  BP 180/110  Pulse 68  Ht 5' 6.5" (1.689 m)  Wt 189 lb (85.73 kg)  BMI 30.05 kg/m2 Well nourished, well developed, in no acute distress HEENT: normal Neck: no JVD Cardiac:  normal S1, S2; RRR; 2/6 crescendo decrescendo murmur heard best at the RUSB Lungs:  clear to auscultation bilaterally, no wheezing, rhonchi or rales Abd: soft, nontender, no hepatomegaly Ext: no edema Skin: warm and dry Neuro:  CNs 2-12 intact, no focal abnormalities noted  EKG:  NSR, HR 68, nonspecific ST-T wave changes, no change from prior tracing     ASSESSMENT AND PLAN:  1. Atrial Fibrillation: Maintaining sinus rhythm. Continue current dose of beta blocker. Continue current dose of Pradaxa. Plan follow up basic metabolic panel and CBC when she returns. We will provide her with samples and also enroll her in the assistance program for Pradaxa. 2. Hypertension: Uncontrolled. Increase Norvasc to 10 mg daily. 3. Subaortic Membrane: She has an appointment with Dr. Laneta Simmers next week. 4. Diabetes: I have advised her to seek follow up with her PCP for continued management. 5. Alcohol Abuse: She admits to abstinence. 6. Disposition: Follow up with me in one month.  Signed, Tereso Newcomer, PA-C  05/16/2013 2:16 PM

## 2013-05-22 ENCOUNTER — Institutional Professional Consult (permissible substitution) (INDEPENDENT_AMBULATORY_CARE_PROVIDER_SITE_OTHER): Payer: No Typology Code available for payment source | Admitting: Surgery

## 2013-05-22 ENCOUNTER — Encounter: Payer: Self-pay | Admitting: Surgery

## 2013-05-22 VITALS — BP 152/89 | HR 68 | Resp 16 | Ht 66.5 in | Wt 182.0 lb

## 2013-05-22 DIAGNOSIS — Q244 Congenital subaortic stenosis: Secondary | ICD-10-CM

## 2013-05-22 NOTE — Progress Notes (Signed)
[address]       [place] [address]             [phone]         PCP is Standley Dakins, MD Referring Provider is Bensimhon, Bevelyn Buckles, MD  Chief Complaint  Patient presents with  . Palpitations    referred for subaortic membrane...ECHO 05/02/13    HPI:  The patient is a 52 year old woman who presented in 2007 with atypical chest pain and ruled out for MI. She had a negative Myoview. She was noted to have a murmur and 2D echo followed by TEE showed what appeared to be a subaortic membrane with a velocity across this membrane of 4 m/s. The valve looked trileaflet with no stenosis and trivial regurgitation. She had a follow up echo in 01/2010 which showed a mean gradient across the LVOT of 21 mm Hg. She did not return for followup until she returned with rapid atrial fibrillation.She was treated with rate control and anticoagulation and converted to sinus. She was intially on Xarelto but developed some bleeding from gums and was switched to Pradaxa. Repeat 2D echo 05/02/2013 showed an increase in the subaortic gradient to a mean of 42 and a peak of 81 with mild LVH and normal systolic function with an EF of 60-65%. There is trivial AI and no MR. She says she is asymptomatic.  Past Medical History  Diagnosis Date  . Atypical chest pain     a. 11/2005 Negative Myoview  . Subaortic membrane     a. 01/2010 Echo: EF 55-60%, No rwma, subaortic membrane with elevated LVOT mean gradient of 21 mmHg, Triv AI, mod dil LA, mildly increased PASP. b. 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 GERD (gastroesophageal reflux disease)   . HTN (hypertension)   . Iron deficiency anemia   . History of substance abuse   . Paroxysmal atrial fibrillation 05/01/2013    On Xarelto  . Diastolic dysfunction   . Heart  murmur   . DM2 (diabetes mellitus, type 2)     Past Surgical History  Procedure Laterality Date  . Cesarean section      Family History  Problem Relation Age of Onset  . Hypertension Sister     alive and well  . Diabetes Father     deceased  . Cancer Mother     deceased @ 43  . Stroke Mother   . Hypertension Sister     alive and well  . Heart attack Brother     deceased @ 25    Social History History  Substance Use Topics  . Smoking status: Never Smoker   . Smokeless tobacco: Current User    Types: Snuff  . Alcohol Use: Yes     Comment: Drinks 3 - 40 oz beers daily.  i QUIT DOING THAT "    Current Outpatient Prescriptions  Medication Sig Dispense Refill  . amLODipine (NORVASC) 5 MG tablet Take 2 tablets (10 mg total) by mouth daily.  30 tablet  6  . antiseptic oral rinse (BIOTENE) LIQD 15 mLs by Mouth Rinse route daily.      . Calcium Carb-Cholecalciferol (CALCIUM-VITAMIN D3) 500-400 MG-UNIT TABS Take 1 tablet by mouth daily.      . dabigatran (PRADAXA) 150 MG CAPS Take 1 capsule (150  mg total) by mouth every 12 (twelve) hours.  60 capsule    . ferrous sulfate 325 (65 FE) MG tablet Take 325 mg by mouth daily with breakfast.      . fish oil-omega-3 fatty acids 1000 MG capsule Take 1 g by mouth daily.      . furosemide (LASIX) 20 MG tablet Take 1 tablet (20 mg total) by mouth daily.  30 tablet  3  . glucose blood test strip Use as instructed  100 each  12  . glucose monitoring kit (FREESTYLE) monitoring kit 1 each by Does not apply route as needed for other.  1 each  0  . Lancets (FREESTYLE) lancets Use as instructed  100 each  12  . metFORMIN (GLUCOPHAGE) 500 MG tablet Take 1 tablet (500 mg total) by mouth 2 (two) times daily with a meal.  60 tablet  1  . metoprolol tartrate (LOPRESSOR) 25 MG tablet Take 1 tablet (25 mg total) by mouth 2 (two) times daily.  60 tablet  1  . Multiple Vitamins-Minerals (MULTIVITAMIN PO) Take 1 tablet by mouth daily.      . Multiple  Vitamins-Minerals (MULTIVITAMIN) tablet Take 1 tablet by mouth daily.      . potassium chloride (K-DUR,KLOR-CON) 10 MEQ tablet Take 1 tablet (10 mEq total) by mouth daily.  30 tablet  3  . sertraline (ZOLOFT) 100 MG tablet Take 0.5 tab po daily for 1 week, then take 1 po daily  30 tablet  3   No current facility-administered medications for this visit.    Allergies  Allergen Reactions  . Ketorolac Tromethamine Other (See Comments)    Unknown reaction    Review of Systems  Constitutional: Negative.   HENT: Positive for dental problem.   Eyes: Negative.   Respiratory: Positive for cough. Negative for shortness of breath.   Cardiovascular: Negative for chest pain, palpitations and leg swelling.  Gastrointestinal: Negative.   Endocrine: Negative.   Genitourinary: Negative.   Musculoskeletal: Negative.   Skin: Negative.   Allergic/Immunologic: Negative.   Neurological: Negative.   Hematological: Negative.   Psychiatric/Behavioral:       Drug and alcohol abuse    BP 152/89  Pulse 68  Resp 16  Ht 5' 6.5" (1.689 m)  Wt 182 lb (82.555 kg)  BMI 28.94 kg/m2  SpO2 99% Physical Exam  Constitutional: She is oriented to person, place, and time. She appears well-developed and well-nourished. No distress.  HENT:  Head: Normocephalic.  Poor dentition and oral hygeine  Eyes: Conjunctivae and EOM are normal. Pupils are equal, round, and reactive to light.  Neck: Normal range of motion. Neck supple. No JVD present. No tracheal deviation present. No thyromegaly present.  Cardiovascular: Normal rate, regular rhythm and intact distal pulses.   Murmur heard. 3/6 harsh systolic murmur heard throughout the precordium with radiation into the neck bilat.  Pulmonary/Chest: Effort normal and breath sounds normal. No respiratory distress. She has no wheezes. She has no rales.  Abdominal: Soft. Bowel sounds are normal. She exhibits no distension and no mass. There is no tenderness.  Musculoskeletal:  Normal range of motion. She exhibits no edema.  Lymphadenopathy:    She has no cervical adenopathy.  Neurological: She is alert and oriented to person, place, and time. No cranial nerve deficit.  Skin: Skin is warm and dry.  Psychiatric: She has a normal mood and affect.     Diagnostic Tests:  [facility] [address] [place], [place] [address]  [phone]   ---------------------------------------------------------------  Transesophageal Echocardiogram  Patient: Jane Arellano MR Number: Study Date: 29-Nov-2005   ---------------------------------------------------------------  Gender:      F  Age:         44 years  DOB:         04-May-1961  Height:      66 in ( 168 cm )  Weight:      187 lb ( 85 kg )  BSA:         1.95 m^2  Pt. Status:  Room:        4710  STAFF  OPERATOR    Will Edwards  PERFORMING  Dionicio Stall M.D.  ORDERING    Adeline Viyuoh  CONSULTING  Daniel Bensimhon  ATTENDING   Attaya Elizebeth Koller   Jasmine Pang   ---------------------------------------------------------------  INDICATIONS:  Evaluate suspected aortic stenosis.  HISTORY:  Chest pain. Dyspnea. Murmur.   ---------------------------------------------------------------  PROCEDURE INFORMATION:  A transesophageal complete 2D study was performed. Additional  evaluation included limited spectral Doppler, and color Doppler. An  adult omni-plane probe was inserted by the attending cardiologist.  This was a routine echocardiographic study. The study was performed  in the Endoscopy suite. The patient was unable to respond to pain  level query due to sedation. Image quality was good. The risks and  alternatives of the procedure were explained to the patient and  informed consent was obtained. Prior to the procedure, the  oropharynx was anesthetized with topical benzocaine spray. The  patient was given midazolam ( 6 mg ) and fentanyl ( 75 mcg ). There  were no complications  during the transesophageal echo procedure.   ---------------------------------------------------------------  LEFT VENTRICLE:  -  Left ventricular size was normal.  -  Overall left ventricular systolic function was normal.  -  Left ventricular ejection fraction was estimated , range being 55        % to 65 %..  -  Left ventricular wall thickness was increased in a pattern of        mild concentric hypertrophy.  AORTIC VALVE:  -  Small ridge in LVOT consistant with small subvalvular membrane        (velocity accross membrane = 70m/s).  -  The aortic valve was trileaflet.  -  There was no evidence for aortic valve vegetation.   Doppler interpretation(s):  -  There was no evidence for aortic valve stenosis.  -  There was trivial aortic valvular regurgitation.  MITRAL VALVE:  -  Delicate mitral valve.  -  There was no evidence for mitral valvular vegetation.   Doppler interpretation(s):  -  There was no evidence for mitral stenosis.  -  There was no significant mitral valvular regurgitation.  LEFT ATRIUM:  -  The left atrium was mildly dilated.  -  The left atrial appendage size was normal.  -  There was no left atrial appendage thrombus identified.   Doppler interpretation(s):  -  The left atrial appendage function was normal (normal emptying        velocity).  -  There was no evidence for a patent foramen ovale by color.  RIGHT VENTRICLE:  -  Right ventricular size was normal.  -  There were no right ventricular regional wall motion        abnormalities.  PULMONIC VALVE:  -  Delicate pulmonic valve.  -  There was no evidence for pulmonic valve vegetation.   Doppler interpretation(s):  -  There was  no pulmonic valve stenosis.  -  There was no significant pulmonic regurgitation.  TRICUSPID VALVE:  -  Delicate tricuspid valve.  -  There was no evidence for tricuspid valve vegetation.   Doppler interpretation(s):  -  There was no evidence for tricuspid stenosis.  -  There was  no significant tricuspid valvular regurgitation.   ---------------------------------------------------------------  SUMMARY  -  Overall left ventricular systolic function was normal. Left        ventricular ejection fraction was estimated , range being 55        % to 65 %..  -  Small ridge in LVOT consistant with small subvalvular membrane        (velocity accross membrane = 44m/s). There was trivial aortic        valvular regurgitation.  -  The left atrium was mildly dilated. The left atrial appendage        function was normal (normal emptying velocity). There was no        left atrial appendage thrombus identified.   ---------------------------------------------------------------  Prepared and Electronically Authenticated by  Dionicio Stall M.D.  Confirmed 29-Nov-2005 14:47:13   Transthoracic Echocardiography   Patient:    Jane Arellano, Jane Arellano  MR #:       [id number]   Study Date: 02/18/2010   Gender:     F  Age:        48  Height:     167.6cm  Weight:     87.1kg  BSA:        1.22m^2  Pt. Status:  Room:    SONOGRAPHER  Luvenia Redden, RDCS   ADMITTING    Bensimhon, Daniel   ATTENDING    Bensimhon, Gray Bernhardt     Bensimhon, Daniel   PERFORMING   [facility]  cc: [facility]   --------------------------------------------------------------------  Indications:   424.1 Aortic valve disorders.   --------------------------------------------------------------------  History:  PMH: Subaortic membrane with a LVOT gradient of 27 mmHg in  2009. Cardiac dysrhythmias. Acquired from the patient and from the  patient's chart. Risk factors: Hypertension. Dyslipidemia.   --------------------------------------------------------------------  Study Conclusions   - Left ventricle: The cavity size was normal. Wall thickness was    normal. Systolic function was normal. The estimated ejection    fraction was in the range of 55% to 60%. Wall motion was  normal;    there were no regional wall motion abnormalities.  - Aortic valve: Trivial regurgitation.  - Left atrium: The atrium was moderately dilated.  - Pulmonary arteries: Systolic pressure was mildly increased.  Impressions:   - Subaortic membrane with elevated LVOT mean gradient of 21 mmHg.  Transthoracic echocardiography. M-mode, complete 2D, spectral  Doppler, and color Doppler. Height: Height: 167.6cm. Height: 66in.  Weight: Weight: 87.1kg. Weight: 191.6lb. Body mass index: BMI:  31kg/m^2. Body surface area: BSA: 1.14m^2. Blood pressure: 136/86.  Patient status: Outpatient. Location: [facility]   --------------------------------------------------------------------   --------------------------------------------------------------------  Left ventricle: The cavity size was normal. Wall thickness was  normal. Systolic function was normal. The estimated ejection  fraction was in the range of 55% to 60%. Wall motion was normal;  there were no regional wall motion abnormalities.   --------------------------------------------------------------------  Aortic valve: Trileaflet; mildly thickened leaflets. Mobility was  not restricted. Doppler: Transvalvular velocity was within the  normal range. There was no stenosis. Trivial regurgitation.   --------------------------------------------------------------------  Aorta: Aortic root: The aortic root was normal in  size.   --------------------------------------------------------------------  Mitral valve: Structurally normal valve. Mobility was not  restricted. Doppler: Transvalvular velocity was within the normal  range. There was no evidence for stenosis. Trivial regurgitation.  Peak gradient: 4mm Hg (D).  --------------------------------------------------------------------  Left atrium: The atrium was moderately dilated.   --------------------------------------------------------------------  Right ventricle: The cavity size  was normal. Systolic function was  normal.   --------------------------------------------------------------------  Pulmonic valve:  Doppler: Transvalvular velocity was within the  normal range. There was no evidence for stenosis.   --------------------------------------------------------------------  Tricuspid valve: Structurally normal valve. Doppler: Transvalvular  velocity was within the normal range. Trivial regurgitation.   --------------------------------------------------------------------  Pulmonary artery: Systolic pressure was mildly increased.   --------------------------------------------------------------------  Right atrium: The atrium was normal in size.   --------------------------------------------------------------------  Pericardium: There was no pericardial effusion.   --------------------------------------------------------------------  Systemic veins:  Inferior vena cava: The vessel was normal in size.   --------------------------------------------------------------------   2D measurements            Normal  Doppler measurements      Normal  Left ventricle                     Mitral valve  LVID ED,       49.3 mm     43-52   Peak E vel     97.2 cm/s  -------  chord, PLAX                        Peak A vel     46.9 cm/s  -------  LVID ES,       24.6 mm     23-38   Deceleration    176 ms    150-230  chord, PLAX                        time  FS, chord,       50 %      >29     Peak gradient,    4 mm Hg -------  PLAX                               D  LVPW, ED       12.2 mm     ------  Peak E/A ratio  2.1       -------  IVS/LVPW       0.78        <1.3    Tricuspid valve  ratio, ED                          Regurg peak     265 cm/s  -------  Ventricular septum                 vel  IVS, ED        9.48 mm     ------  Peak RV-RA       28 mm Hg -------  Aorta                              gradient, S  Root diam, ED    21 mm     ------  Left atrium  AP dim           43  mm     ------  AP dim index   2.18 cm/m^2 <2.2   --------------------------------------------------------------------  Prepared and Electronically Authenticated by   Olga Millers, MD, [facility]  2011-03-31T14:06:35.940      *[place]*                *[facility]*                      [address]                     [place], [place] [address]                         [phone]   ------------------------------------------------------------ Transthoracic Echocardiography  Patient:    Jane Arellano, Jane Arellano MR #:       [id number]  Study Date: 05/02/2013  Gender:     F Age:        51 Height:     168.9cm Weight:     84.5kg BSA:        2.49m^2 Pt. Status: Room:       2925    PERFORMING   [place], [facility]  SONOGRAPHER  Cathie Beams  Candis Shine  ADMITTING    Bensimhon, Daniel  ATTENDING    Bensimhon, Daniel cc:  ------------------------------------------------------------ LV EF: 60% -   65%  ------------------------------------------------------------ Indications:   Previous study 02/18/2010.  ------------------------------------------------------------ History:   PMH:  Palpitations. Anemia. History of cocaine abuse. Aortic valve disorder. Subaortic membrane.  Atrial fibrillation.  Risk factors:  Hypertension.  ------------------------------------------------------------ Study Conclusions  - Left ventricle: The cavity size was normal. Wall thickness   was increased in a pattern of mild LVH. Systolic function   was normal. The estimated ejection fraction was in the   range of 60% to 65%. Wall motion was normal; there were no   regional wall motion abnormalities. Doppler parameters are   consistent with abnormal left ventricular relaxation   (grade 1 diastolic dysfunction). - Aortic valve: No valvular aortic stenosis but suspect   subaortic stenosis with mean gradient 42 mmHg/peak   gradient 81 mmHg in the LVOT. Trivial  regurgitation. - Mitral valve: No significant regurgitation. - Left atrium: The atrium was mildly dilated. - Right ventricle: Poorly visualized. The cavity size was   normal. Systolic function was normal. - Right atrium: The atrium was mildly dilated. - Pulmonary arteries: No complete TR doppler jet so unable   to estimate PA systolic pressure. - Inferior vena cava: The vessel was normal in size; the   respirophasic diameter changes were in the normal range (=   50%); findings are consistent with normal central venous   pressure. Impressions:  - Normal LV size with mild LV hypertrophy. EF 60-65%. There   was no valvular aortic stenosis. There was turbulence in   the LV outflow tract with possible subvalvular membrane.   There was a significant LVOT gradient mean 42 mmHg/peak 81   mmHg. Would consider TEE to evaluate subvalvular membrane. Transthoracic echocardiography.  M-mode, complete 2D, spectral Doppler, and color Doppler.  Height:  Height: 168.9cm. Height: 66.5in.  Weight:  Weight: 84.5kg. Weight: 185.9lb.  Body mass index:  BMI: 29.6kg/m^2.  Body surface area:    BSA: 2.43m^2.  Blood pressure:     154/91.  Patient status:  Inpatient.  Location:  ICU/CCU  ------------------------------------------------------------  ------------------------------------------------------------ Left ventricle:  The cavity size was normal. Wall thickness was increased  in a pattern of mild LVH. Systolic function was normal. The estimated ejection fraction was in the range of 60% to 65%. Wall motion was normal; there were no regional wall motion abnormalities. Doppler parameters are consistent with abnormal left ventricular relaxation (grade 1 diastolic dysfunction).  ------------------------------------------------------------ Aortic valve:   Trileaflet; mildly calcified leaflets. No valvular aortic stenosis but suspect subaortic stenosis with mean gradient 42 mmHg/peak gradient 81 mmHg in  the LVOT. Doppler:   Trivial regurgitation.  ------------------------------------------------------------ Aorta:  Aortic root: The aortic root was normal in size. Ascending aorta: The ascending aorta was normal in size.  ------------------------------------------------------------ Mitral valve:   Doppler:   There was no evidence for stenosis.    No significant regurgitation.    Peak gradient: 3mm Hg (D).  ------------------------------------------------------------ Left atrium:  The atrium was mildly dilated.  ------------------------------------------------------------ Right ventricle:  Poorly visualized. The cavity size was normal. Systolic function was normal.  ------------------------------------------------------------ Pulmonic valve:    Structurally normal valve.   Cusp separation was normal.  Doppler:  Transvalvular velocity was within the normal range.  No regurgitation.  ------------------------------------------------------------ Tricuspid valve:   Doppler:   Trivial regurgitation.  ------------------------------------------------------------ Pulmonary artery:   No complete TR doppler jet so unable to estimate PA systolic pressure.  ------------------------------------------------------------ Right atrium:  The atrium was mildly dilated.  ------------------------------------------------------------ Pericardium:  There was no pericardial effusion.  ------------------------------------------------------------ Systemic veins: Inferior vena cava: The vessel was normal in size; the respirophasic diameter changes were in the normal range (= 50%); findings are consistent with normal central venous pressure.  ------------------------------------------------------------  2D measurements        Normal  Doppler               Normal Left ventricle                 measurements LVID ED,   43.3 mm     43-52   Left ventricle chord,                         Ea, lat      10.9  cm/ ------- PLAX                           ann, tiss         s LVID ES,   22.8 mm     23-38   DP chord,                         E/Ea, lat    7.42     ------- PLAX                           ann, tiss FS, chord,   47 %      >29     DP PLAX                           Ea, med      5.92 cm/ ------- LVPW, ED   13.1 mm     ------  ann, tiss         s IVS/LVPW   0.88        <1.3    DP ratio, ED  E/Ea, med   13.67     ------- Ventricular septum             ann, tiss IVS, ED    11.5 mm     ------  DP LVOT                           Mitral valve Diam, S      15 mm     ------  Peak E vel   80.9 cm/ ------- Area       1.77 cm^2   ------                    s Aorta                          Peak A vel   87.8 cm/ ------- Root diam,   31 mm     ------                    s ED                             Deceleratio   317 ms  150-230 Left atrium                    n time AP dim       37 mm     ------  Peak            3 mm  ------- AP dim     1.84 cm/m^2 <2.2    gradient, D       Hg index                          Peak E/A      0.9     -------                                ratio                                Right ventricle                                Sa vel, lat  15.5 cm/ -------                                ann, tiss         s                                DP   ------------------------------------------------------------ Prepared and Electronically Authenticated by  Marca Ancona 2014-06-12T10:32:06.920       Impression:  She has a subaortic stenosis with an increasing gradient felt to be due to a subaortic membrane. The 2D echo does not give any anatomic detail although I think it is clearly a membrane or ridge by TEE in 2007. I think it would be worthwhile doing a cardiac CT to define the anatomical detail of this area because this will help with  the surgical resection. Although she is asymptomatic I think this should be resected since she has a high gradient and it can  eventually result in a fibrotic tunnel stenosis of the LVOT which can result in a much more complicated repair. She will require cardiac cath at some point. She will also require dental evaluation.   Plan:  I will set up a cardiac CT with Dr. Llana Aliment and will see her back in the office afterward to review the result and make further plans.

## 2013-05-28 ENCOUNTER — Other Ambulatory Visit: Payer: Self-pay

## 2013-05-28 DIAGNOSIS — I359 Nonrheumatic aortic valve disorder, unspecified: Secondary | ICD-10-CM

## 2013-06-10 ENCOUNTER — Ambulatory Visit (HOSPITAL_COMMUNITY): Admission: RE | Admit: 2013-06-10 | Payer: No Typology Code available for payment source | Source: Ambulatory Visit

## 2013-06-11 ENCOUNTER — Other Ambulatory Visit: Payer: Self-pay | Admitting: *Deleted

## 2013-06-11 ENCOUNTER — Ambulatory Visit: Payer: No Typology Code available for payment source | Admitting: Physician Assistant

## 2013-06-11 MED ORDER — FUROSEMIDE 20 MG PO TABS: 20.0000 mg | ORAL_TABLET | Freq: Every day | ORAL | Status: DC

## 2013-06-11 MED ORDER — POTASSIUM CHLORIDE CRYS ER 10 MEQ PO TBCR: 10.0000 meq | EXTENDED_RELEASE_TABLET | Freq: Every day | ORAL | Status: DC

## 2013-06-24 ENCOUNTER — Telehealth: Payer: Self-pay

## 2013-06-24 NOTE — Telephone Encounter (Signed)
pt pradaxa asst form missing financial info.lmom for pt to call back

## 2013-06-24 NOTE — Telephone Encounter (Signed)
spoke w/pt about pradaxa asst form pt will provide a ltr from her dtr stating she is being financially supported by her.pt will leave ltr at front desk attn:Lisa Verl Bangs

## 2013-06-24 NOTE — Telephone Encounter (Signed)
Follow Up ° ° °Pt returning call from earlier. Please call back. °

## 2013-06-27 ENCOUNTER — Telehealth: Payer: Self-pay

## 2013-06-27 NOTE — Telephone Encounter (Signed)
Pt Pradaxa asst form faxed

## 2013-07-04 ENCOUNTER — Telehealth: Payer: Self-pay | Admitting: *Deleted

## 2013-07-04 MED ORDER — METOPROLOL TARTRATE 25 MG PO TABS: 25.0000 mg | ORAL_TABLET | Freq: Two times a day (BID) | ORAL | Status: DC

## 2013-07-04 NOTE — Telephone Encounter (Signed)
Pt states she needs a refill on Metformin i told that i will send a message to [facility] Assistant to verify if he will fill this for her she states she's in a financial situation and cant afford to go to primary care to obtain refill

## 2013-07-05 ENCOUNTER — Telehealth: Payer: Self-pay | Admitting: *Deleted

## 2013-07-05 NOTE — Telephone Encounter (Signed)
cb pt and asked her about the metformin refill she was asking about. I stated she should be having filled w/PCP since we do not  monitor DM. I also stated PA on vacation. I said PCP should work with her and give her enough until she sees PCP. I stated to pt she should not run out of her metformin so she really should call PCP since PA on vacation. She has f/u w/PA 8/20 and said she has enough metformin until then, again I stated not a regular medication that we fill here and should be followed by PCP since they monitor her DM.  I said she can ask PA at her f/u appt to see if he will fill one time for her. Pt verbalized understanding. 

## 2013-07-05 NOTE — Telephone Encounter (Signed)
cb pt and asked her about the metformin refill she was asking about. I stated she should be having filled w/PCP since we do not  monitor DM. I also stated PA on vacation. I said PCP should work with her and give her enough until she sees PCP. I stated to pt she should not run out of her metformin so she really should call PCP since PA on vacation. She has f/u w/PA 8/20 and said she has enough metformin until then, again I stated not a regular medication that we fill here and should be followed by PCP since they monitor her DM.  I said she can ask PA at her f/u appt to see if he will fill one time for her. Pt verbalized understanding.

## 2013-07-10 ENCOUNTER — Ambulatory Visit (INDEPENDENT_AMBULATORY_CARE_PROVIDER_SITE_OTHER): Payer: No Typology Code available for payment source | Admitting: Physician Assistant

## 2013-07-10 ENCOUNTER — Encounter: Payer: Self-pay | Admitting: Physician Assistant

## 2013-07-10 ENCOUNTER — Encounter: Payer: Self-pay | Admitting: *Deleted

## 2013-07-10 VITALS — BP 150/86 | HR 66 | Ht 66.5 in | Wt 178.0 lb

## 2013-07-10 DIAGNOSIS — I4891 Unspecified atrial fibrillation: Secondary | ICD-10-CM

## 2013-07-10 DIAGNOSIS — E119 Type 2 diabetes mellitus without complications: Secondary | ICD-10-CM

## 2013-07-10 DIAGNOSIS — F1010 Alcohol abuse, uncomplicated: Secondary | ICD-10-CM

## 2013-07-10 DIAGNOSIS — I10 Essential (primary) hypertension: Secondary | ICD-10-CM

## 2013-07-10 DIAGNOSIS — Q244 Congenital subaortic stenosis: Secondary | ICD-10-CM

## 2013-07-10 LAB — CBC WITH DIFFERENTIAL/PLATELET
Basophils Absolute: 0 10*3/uL (ref 0.0–0.1)
Eosinophils Absolute: 0.1 10*3/uL (ref 0.0–0.7)
HCT: 33.5 % — ABNORMAL LOW (ref 36.0–46.0)
Hemoglobin: 11.4 g/dL — ABNORMAL LOW (ref 12.0–15.0)
Lymphs Abs: 1.3 10*3/uL (ref 0.7–4.0)
MCHC: 33.9 g/dL (ref 30.0–36.0)
MCV: 93.3 fl (ref 78.0–100.0)
Monocytes Absolute: 0.3 10*3/uL (ref 0.1–1.0)
Monocytes Relative: 6.3 % (ref 3.0–12.0)
Neutro Abs: 3.5 10*3/uL (ref 1.4–7.7)
Platelets: 190 10*3/uL (ref 150.0–400.0)
RDW: 13.4 % (ref 11.5–14.6)

## 2013-07-10 LAB — BASIC METABOLIC PANEL
BUN: 11 mg/dL (ref 6–23)
CO2: 31 mEq/L (ref 19–32)
GFR: 120.43 mL/min (ref >60.00)
Glucose, Bld: 97 mg/dL (ref 70–99)
Potassium: 3.7 mEq/L (ref 3.5–5.1)
Sodium: 142 mEq/L (ref 135–145)

## 2013-07-10 MED ORDER — METFORMIN HCL 500 MG PO TABS: 500.0000 mg | ORAL_TABLET | Freq: Two times a day (BID) | ORAL | Status: DC

## 2013-07-10 MED ORDER — CARVEDILOL 6.25 MG PO TABS: 6.2500 mg | ORAL_TABLET | Freq: Two times a day (BID) | ORAL | Status: DC

## 2013-07-10 NOTE — Progress Notes (Signed)
[address], [address] [place], [place]  [address] Phone: [phone] Fax:  [phone]  Date:  07/10/2013   ID:  JEANAE WHITMILL, DOB September 09, 1961, MRN [id number]  PCP:  Standley Dakins, MD  Cardiologist:  Dr. Sherryl Manges     History of Present Illness: Jane Arellano is a 52 y.o. female who returns for follow up.  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  She was admitted 04/2013 with AFib with RVR.  She spontaneously converted to NSR on rate control Rx. She was placed on Xarelto for anticoagulation. 2D echo 05/02/13:  Mild LVH, EF 60-65%, grade 1 diastolic dysfunction, subaortic stenosis, mean gradient 42, peak gradient 81, mild LAE, mild RAE.  There was a considerable increase in her gradient.  She was readmitted the same month with palpitations. Patient remained in NSR. Cardiac markers remained negative.  She was placed on disopyramide for a brief amount time but this was changed to metoprolol. Given her significant increase in LVOT gradient, outpatient consultation with [facility] was to be arranged.  She did have side effects to the Xarelto and was switched to Pradaxa 150 twice a day.   I saw her 6/26 in f/u.  I adjusted her medications for her BP.  She has since seen Dr. Laneta Simmers.  Cardiac CT is pending to help guide surgery.  His note indicates she would benefit from surgical resection of her subaortic membrane resulting in subaortic stenosis.  His note also indicates she will eventually need a cardiac cath.    Since last seen she is doing well.  The patient denies chest pain, shortness of breath, syncope, orthopnea, PND or significant pedal edema. Denies any further ETOH use.  She wants to get a job to help with finances.    Labs (6/14):  K 4.2, Cr 0.63, ALT 24, LDL 64, Hgb 13.6, TSH 1.178   Wt Readings from Last 3 Encounters:  05/22/13 182 lb (82.555 kg)  05/16/13 189 lb (85.73 kg)  05/06/13 183 lb 3.2 oz (83.1 kg)      Past Medical History  Diagnosis Date  . Atypical chest pain     a. 11/2005 Negative Myoview  . Subaortic membrane     a. 01/2010 Echo: EF 55-60%, No rwma, subaortic membrane with elevated LVOT mean gradient of 21 mmHg, Triv AI, mod dil LA, mildly increased PASP. b. 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 GERD (gastroesophageal reflux disease)   . HTN (hypertension)   . Iron deficiency anemia   . History of substance abuse   . Paroxysmal atrial fibrillation 05/01/2013    On Xarelto  . Diastolic dysfunction   . Heart murmur   . DM2 (diabetes mellitus, type 2)     Current Outpatient Prescriptions  Medication Sig Dispense Refill  . amLODipine (NORVASC) 5 MG tablet Take 2 tablets (10 mg total) by mouth daily.  30 tablet  6  . antiseptic oral rinse (BIOTENE) LIQD 15 mLs by Mouth Rinse route daily.      . Calcium Carb-Cholecalciferol (CALCIUM-VITAMIN D3) 500-400 MG-UNIT TABS Take 1 tablet by mouth daily.      . dabigatran (PRADAXA) 150 MG CAPS Take 1 capsule (150 mg total) by mouth every 12 (twelve) hours.  60 capsule    . ferrous sulfate 325 (65  FE) MG tablet Take 325 mg by mouth daily with breakfast.      . fish oil-omega-3 fatty acids 1000 MG capsule Take 1 g by mouth daily.      . furosemide (LASIX) 20 MG tablet Take 1 tablet (20 mg total) by mouth daily.  30 tablet  3  . glucose blood test strip Use as instructed  100 each  12  . glucose monitoring kit (FREESTYLE) monitoring kit 1 each by Does not apply route as needed for other.  1 each  0  . Lancets (FREESTYLE) lancets Use as instructed  100 each  12  . metFORMIN (GLUCOPHAGE) 500 MG tablet Take 1 tablet (500 mg total) by mouth 2 (two) times daily with a meal.  60 tablet  1  . metoprolol tartrate (LOPRESSOR) 25 MG tablet Take 1 tablet (25 mg total) by mouth 2 (two)  times daily.  60 tablet  1  . Multiple Vitamins-Minerals (MULTIVITAMIN PO) Take 1 tablet by mouth daily.      . Multiple Vitamins-Minerals (MULTIVITAMIN) tablet Take 1 tablet by mouth daily.      . potassium chloride (K-DUR,KLOR-CON) 10 MEQ tablet Take 1 tablet (10 mEq total) by mouth daily.  30 tablet  3  . sertraline (ZOLOFT) 100 MG tablet Take 0.5 tab po daily for 1 week, then take 1 po daily  30 tablet  3   No current facility-administered medications for this visit.    Allergies:    Allergies  Allergen Reactions  . Ketorolac Tromethamine Other (See Comments)    Unknown reaction    Social History:  The patient  reports that she has never smoked. Her smokeless tobacco use includes Snuff. She reports that  drinks alcohol. She reports that she uses illicit drugs (Marijuana).   ROS:  Please see the history of present illness.      All other systems reviewed and negative.   PHYSICAL EXAM: VS:  BP 150/86  Pulse 66  Ht 5' 6.5" (1.689 m)  Wt 178 lb (80.74 kg)  BMI 28.3 kg/m2 Well nourished, well developed, in no acute distress HEENT: normal Neck: no JVD Cardiac:  normal S1, S2; RRR; 2/6 crescendo decrescendo murmur heard best at the RUSB Lungs:  clear to auscultation bilaterally, no wheezing, rhonchi or rales Abd: soft, nontender, no hepatomegaly Ext: no edema Skin: warm and dry Neuro:  CNs 2-12 intact, no focal abnormalities noted  EKG:  NSR, HR 66, nonspecific ST-T wave changes, no change from prior tracing     ASSESSMENT AND PLAN:  1. Atrial Fibrillation:  Maintaining NSR.  Continue Pradaxa.  Check CBC and BMET.  Continue beta blocker.  See below.   2. Hypertension:  Uncontrolled.  No metoprolol in 2 days (she ran out).  Will d/c metoprolol and start coreg 6.25 mg bid.   3. Subaortic Membrane:  Will make sure she has f/u with Dr. Laneta Simmers.  She is not sure when she is supposed to get the CT.  As noted, she will likely need cardiac cath at some point prior to surgery.  We can  arrange this when necessary.   4. Diabetes:  F/u with PCP.   Will give her a one time Rx for Metformin (She can get 1 month free at [facility]).  Otherwise, she will need to get this from her PCP.   5. Alcohol Abuse:  She admits to abstinence. 6. Disposition:  Follow up me in 2 mos.  I will give her a note to restrict  her from heavy lifting so she can hopefully find some work.    Signed, Tereso Newcomer, PA-C  07/10/2013 2:20 PM

## 2013-07-10 NOTE — Patient Instructions (Addendum)
LABS TODAY; BMET, CBC W/DIFF  STOP METOPROLOL  START COREG 6.25 MG TABLET TAKE 1 TABLET TWICE DAILY CALLED INTO THE HEALTH DEPT.  A ONE TIME REFILL WAS SENT INTO [facility] [address] FOR METFORMIN   YOU HAVE BEEN GIVEN A WORK NOTE AS WELL

## 2013-07-12 ENCOUNTER — Telehealth: Payer: Self-pay | Admitting: *Deleted

## 2013-07-12 DIAGNOSIS — D649 Anemia, unspecified: Secondary | ICD-10-CM

## 2013-07-12 MED ORDER — FERROUS SULFATE 325 (65 FE) MG PO TABS: 325.0000 mg | ORAL_TABLET | Freq: Two times a day (BID) | ORAL | Status: DC

## 2013-07-12 NOTE — Telephone Encounter (Signed)
Message copied by Tarri Fuller on Fri Jul 12, 2013  1:29 PM ------      Message from: [place], [place] T      Created: Fri Jul 12, 2013 12:21 PM       Yes.  Take FeSO4 bid      Tereso Newcomer, PA-C        07/12/2013 12:21 PM ------

## 2013-07-12 NOTE — Telephone Encounter (Signed)
Message copied by Tarri Fuller on Fri Jul 12, 2013  9:34 AM ------      Message from: [place], [place] T      Created: Thu Jul 11, 2013  5:28 PM       Potassium and kidney function ok      Hgb slightly lower - repeat CBC in 2 weeks      Follow up with PCP      Tereso Newcomer, PA-C        07/11/2013 5:28 PM ------

## 2013-07-12 NOTE — Telephone Encounter (Signed)
no answer on any of the #'s on file. I will try again later to advise pt to increase FE supp to BID,

## 2013-07-12 NOTE — Telephone Encounter (Signed)
pt notified ok per Bing Neighbors. PA to increase FE to BID, with cbc w/diff on 07/26/13. Pt verbalized understanding to PLan of Care

## 2013-07-12 NOTE — Telephone Encounter (Signed)
pt notified about lab results with understanding to Plan of Care, repeat cbc w/diff 07/26/13, pt asked should she start taking her FE BID. I explained to pt that I will d/w PA and let her know. pt said thank you

## 2013-07-19 ENCOUNTER — Telehealth: Payer: Self-pay | Admitting: Physician Assistant

## 2013-07-19 MED ORDER — DABIGATRAN ETEXILATE MESYLATE 150 MG PO CAPS: 150.0000 mg | ORAL_CAPSULE | Freq: Two times a day (BID) | ORAL | Status: DC

## 2013-07-19 NOTE — Telephone Encounter (Signed)
Will print script from system and have Tereso Newcomer sign next week and fax to [employer].

## 2013-07-19 NOTE — Telephone Encounter (Signed)
New problem   Jane Arellano/[facility] need a prescription for Pradaxa fax to [phone] [name] make sure fax has a cover sheet. If any question please call.

## 2013-07-26 ENCOUNTER — Encounter: Payer: No Typology Code available for payment source | Admitting: Internal Medicine

## 2013-07-26 ENCOUNTER — Other Ambulatory Visit: Payer: No Typology Code available for payment source

## 2013-07-30 ENCOUNTER — Other Ambulatory Visit (INDEPENDENT_AMBULATORY_CARE_PROVIDER_SITE_OTHER): Payer: No Typology Code available for payment source

## 2013-07-30 DIAGNOSIS — D649 Anemia, unspecified: Secondary | ICD-10-CM

## 2013-07-30 LAB — CBC WITH DIFFERENTIAL/PLATELET
Basophils Absolute: 0 10*3/uL (ref 0.0–0.1)
Eosinophils Relative: 1.2 % (ref 0.0–5.0)
HCT: 31.8 % — ABNORMAL LOW (ref 36.0–46.0)
Lymphs Abs: 1.4 10*3/uL (ref 0.7–4.0)
Monocytes Relative: 5.6 % (ref 3.0–12.0)
Neutrophils Relative %: 64.6 % (ref 43.0–77.0)
Platelets: 198 10*3/uL (ref 150.0–400.0)
RDW: 13.3 % (ref 11.5–14.6)
WBC: 5.2 10*3/uL (ref 4.5–10.5)

## 2013-07-31 ENCOUNTER — Telehealth: Payer: Self-pay | Admitting: *Deleted

## 2013-07-31 DIAGNOSIS — D509 Iron deficiency anemia, unspecified: Secondary | ICD-10-CM

## 2013-07-31 MED ORDER — FERROUS SULFATE 325 (65 FE) MG PO TABS: 325.0000 mg | ORAL_TABLET | Freq: Three times a day (TID) | ORAL | Status: DC

## 2013-07-31 NOTE — Telephone Encounter (Signed)
pt notified ok per Bing Neighbors. PA to increase FE to TID, cbc w/diff 10/13; pt verbalized understanding to Plan of Care

## 2013-07-31 NOTE — Telephone Encounter (Signed)
pt notified about lab results and w/repeat cbc w/diff 10/13. Pt asked should she increase FE to TID. I advised pt that I will d/w Tereso Newcomer, PA about the FE and cb w/recommendation. Pt verbalized understading to Plan of Care

## 2013-08-02 ENCOUNTER — Ambulatory Visit: Payer: No Typology Code available for payment source | Attending: Internal Medicine | Admitting: Internal Medicine

## 2013-08-02 VITALS — BP 135/77 | HR 73 | Temp 98.7°F | Resp 16 | Wt 180.4 lb

## 2013-08-02 DIAGNOSIS — Z124 Encounter for screening for malignant neoplasm of cervix: Secondary | ICD-10-CM

## 2013-08-02 DIAGNOSIS — Z23 Encounter for immunization: Secondary | ICD-10-CM

## 2013-08-02 MED ORDER — METFORMIN HCL 500 MG PO TABS: 500.0000 mg | ORAL_TABLET | Freq: Two times a day (BID) | ORAL | Status: DC

## 2013-08-02 NOTE — Progress Notes (Signed)
Patient here to establish care physical

## 2013-08-02 NOTE — Patient Instructions (Signed)

## 2013-08-02 NOTE — Progress Notes (Signed)
Patient ID: Jane Arellano, female   DOB: February 14, 1961, 52 y.o.   MRN: [id number]  CC: Needs refill on metformin  HPI: Patient is 52 year old female with diabetes, hypertension who presents to clinic for followup and would like refill on metformin. She denies chest pain or shortness of breath, no recent sicknesses or hospitalizations, she reports compliance with medicines.  Allergies  Allergen Reactions  . Ketorolac Tromethamine Other (See Comments)    Unknown reaction   Past Medical History  Diagnosis Date  . Atypical chest pain     a. 11/2005 Negative Myoview  . Subaortic membrane     a. 01/2010 Echo: EF 55-60%, No rwma, subaortic membrane with elevated LVOT mean gradient of 21 mmHg, Triv AI, mod dil LA, mildly increased PASP. b. 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 GERD (gastroesophageal reflux disease)   . HTN (hypertension)   . Iron deficiency anemia   . History of substance abuse   . Paroxysmal atrial fibrillation 05/01/2013    On Xarelto  . Diastolic dysfunction   . Heart murmur   . DM2 (diabetes mellitus, type 2)    Current Outpatient Prescriptions on File Prior to Visit  Medication Sig Dispense Refill  . amLODipine (NORVASC) 5 MG tablet Take 2 tablets (10 mg total) by mouth daily.  30 tablet  6  . antiseptic oral rinse (BIOTENE) LIQD 15 mLs by Mouth Rinse route daily.      . Calcium Carb-Cholecalciferol (CALCIUM-VITAMIN D3) 500-400 MG-UNIT TABS Take 1 tablet by mouth daily.      . carvedilol (COREG) 6.25 MG tablet Take 1 tablet (6.25 mg total) by mouth 2 (two) times daily with a meal.  60 tablet  11  . dabigatran (PRADAXA) 150 MG CAPS capsule Take 1 capsule (150 mg total) by mouth every 12 (twelve) hours.  180 capsule  3  . ferrous sulfate 325 (65 FE) MG tablet Take 1 tablet (325 mg total) by mouth 3  (three) times daily with meals.      . fish oil-omega-3 fatty acids 1000 MG capsule Take 1 g by mouth daily.      . furosemide (LASIX) 20 MG tablet Take 1 tablet (20 mg total) by mouth daily.  30 tablet  3  . glucose blood test strip Use as instructed  100 each  12  . glucose monitoring kit (FREESTYLE) monitoring kit 1 each by Does not apply route as needed for other.  1 each  0  . Lancets (FREESTYLE) lancets Use as instructed  100 each  12  . Multiple Vitamins-Minerals (MULTIVITAMIN) tablet Take 1 tablet by mouth daily.      . potassium chloride (K-DUR,KLOR-CON) 10 MEQ tablet Take 1 tablet (10 mEq total) by mouth daily.  30 tablet  3   No current facility-administered medications on file prior to visit.   Family History  Problem Relation Age of Onset  . Hypertension Sister     alive and well  . Diabetes Father     deceased  . Cancer Mother     deceased @ 92  . Stroke Mother   . Hypertension Sister     alive and well  . Heart attack Brother     deceased @ 9   History   Social History  . Marital Status: Single    Spouse Name: N/A  Number of Children: N/A  . Years of Education: N/A   Occupational History  . Not on file.   Social History Main Topics  . Smoking status: Never Smoker   . Smokeless tobacco: Current User    Types: Snuff  . Alcohol Use: Yes     Comment: Drinks 3 - 40 oz beers daily.  i QUIT DOING THAT "  . Drug Use: Yes    Special: Marijuana     Comment: Smokes marijuana weekly.  Has not used cocaine in 9 years.  . Sexual Activity: Not on file   Other Topics Concern  . Not on file   Social History Narrative   Lives in [place] by herself.  She had been caring for her mother but she died 2 mos ago.  She tries to remain active but does not regularly exercise.    Review of Systems  Constitutional: Negative for fever, chills, diaphoresis, activity change, appetite change and fatigue.  HENT: Negative for ear pain, nosebleeds, congestion, facial swelling,  rhinorrhea, neck pain, neck stiffness and ear discharge.   Eyes: Negative for pain, discharge, redness, itching and visual disturbance.  Respiratory: Negative for cough, choking, chest tightness, shortness of breath, wheezing and stridor.   Cardiovascular: Negative for chest pain, palpitations and leg swelling.  Gastrointestinal: Negative for abdominal distention.  Genitourinary: Negative for dysuria, urgency, frequency, hematuria, flank pain, decreased urine volume, difficulty urinating and dyspareunia.  Musculoskeletal: Negative for back pain, joint swelling, arthralgias and gait problem.  Neurological: Negative for dizziness, tremors, seizures, syncope, facial asymmetry, speech difficulty, weakness, light-headedness, numbness and headaches.  Hematological: Negative for adenopathy. Does not bruise/bleed easily.  Psychiatric/Behavioral: Negative for hallucinations, behavioral problems, confusion, dysphoric mood, decreased concentration and agitation.    Objective:   Filed Vitals:   08/02/13 1232  BP: 135/77  Pulse: 73  Temp: 98.7 F (37.1 C)  Resp: 16    Physical Exam  Constitutional: Appears well-developed and well-nourished. No distress.  CVS: RRR, S1/S2 +, no murmurs, no gallops, no carotid bruit.  Pulmonary: Effort and breath sounds normal, no stridor, rhonchi, wheezes, rales.  Abdominal: Soft. BS +,  no distension, tenderness, rebound or guarding.  Musculoskeletal: Normal range of motion. No edema and no tenderness.  Lymphadenopathy: No lymphadenopathy noted, cervical, inguinal.   Lab Results  Component Value Date   WBC 5.2 07/30/2013   HGB 10.6* 07/30/2013   HCT 31.8* 07/30/2013   MCV 93.6 07/30/2013   PLT 198.0 07/30/2013   Lab Results  Component Value Date   CREATININE 0.7 07/10/2013   BUN 11 07/10/2013   NA 142 07/10/2013   K 3.7 07/10/2013   CL 106 07/10/2013   CO2 31 07/10/2013    Lab Results  Component Value Date   HGBA1C 7.1* 05/06/2013   Lipid Panel     Component  Value Date/Time   CHOL 157 05/02/2013 0535   TRIG 131 05/02/2013 0535   HDL 67 05/02/2013 0535   CHOLHDL 2.3 05/02/2013 0535   VLDL 26 05/02/2013 0535   LDLCALC 64 05/02/2013 0535       Assessment and plan:   Patient Active Problem List   Diagnosis Date Noted  . Diastolic dysfunction - this appears clinically stable, no crackles on exam, no lower extremity edema, advised continuing to check weight regularly  05/02/2013  . HTN (hypertension) - reasonable blood pressure on today's visit, I have advised patient to check her blood pressure regularly and to call us back if the numbers are persistently higher  than 140/90    .  diabetes mellitus  - last A1c 7.1, I have advised her to come back to clinic to have her A1c check that she's not able to have blood test done today, she is rushing to run some errands    . Iron deficiency anemia - stable hg on recent CBC

## 2013-08-06 ENCOUNTER — Encounter: Payer: Self-pay | Admitting: Obstetrics & Gynecology

## 2013-09-02 ENCOUNTER — Other Ambulatory Visit: Payer: No Typology Code available for payment source

## 2013-09-04 ENCOUNTER — Other Ambulatory Visit (INDEPENDENT_AMBULATORY_CARE_PROVIDER_SITE_OTHER): Payer: No Typology Code available for payment source

## 2013-09-04 DIAGNOSIS — D509 Iron deficiency anemia, unspecified: Secondary | ICD-10-CM

## 2013-09-04 LAB — CBC WITH DIFFERENTIAL/PLATELET
Basophils Relative: 0.3 % (ref 0.0–3.0)
Eosinophils Relative: 0.9 % (ref 0.0–5.0)
HCT: 34.9 % — ABNORMAL LOW (ref 36.0–46.0)
Lymphs Abs: 1.6 10*3/uL (ref 0.7–4.0)
MCV: 93.5 fl (ref 78.0–100.0)
Monocytes Absolute: 0.3 10*3/uL (ref 0.1–1.0)
Monocytes Relative: 5.3 % (ref 3.0–12.0)
Neutrophils Relative %: 64.7 % (ref 43.0–77.0)
Platelets: 203 10*3/uL (ref 150.0–400.0)
RBC: 3.73 Mil/uL — ABNORMAL LOW (ref 3.87–5.11)
WBC: 5.7 10*3/uL (ref 4.5–10.5)

## 2013-09-19 ENCOUNTER — Encounter: Payer: Self-pay | Admitting: Obstetrics & Gynecology

## 2013-09-19 ENCOUNTER — Ambulatory Visit: Payer: Self-pay | Admitting: Internal Medicine

## 2013-09-30 ENCOUNTER — Telehealth: Payer: Self-pay | Admitting: Internal Medicine

## 2013-09-30 NOTE — Telephone Encounter (Signed)
Pt is having some trouble with her acid reflux and would like to know if she can get some relief; pt also stated she may have a yeast infection

## 2013-10-03 ENCOUNTER — Encounter: Payer: Self-pay | Admitting: Internal Medicine

## 2013-10-03 ENCOUNTER — Ambulatory Visit: Payer: Self-pay | Attending: Internal Medicine | Admitting: Internal Medicine

## 2013-10-03 VITALS — BP 143/97 | HR 73 | Temp 98.5°F | Resp 16 | Ht 66.5 in | Wt 179.0 lb

## 2013-10-03 DIAGNOSIS — F1010 Alcohol abuse, uncomplicated: Secondary | ICD-10-CM

## 2013-10-03 DIAGNOSIS — F341 Dysthymic disorder: Secondary | ICD-10-CM

## 2013-10-03 DIAGNOSIS — E119 Type 2 diabetes mellitus without complications: Secondary | ICD-10-CM | POA: Insufficient documentation

## 2013-10-03 DIAGNOSIS — I10 Essential (primary) hypertension: Secondary | ICD-10-CM

## 2013-10-03 DIAGNOSIS — I4891 Unspecified atrial fibrillation: Secondary | ICD-10-CM | POA: Insufficient documentation

## 2013-10-03 DIAGNOSIS — F418 Other specified anxiety disorders: Secondary | ICD-10-CM

## 2013-10-03 DIAGNOSIS — Q244 Congenital subaortic stenosis: Secondary | ICD-10-CM

## 2013-10-03 DIAGNOSIS — R002 Palpitations: Secondary | ICD-10-CM

## 2013-10-03 LAB — POCT GLYCOSYLATED HEMOGLOBIN (HGB A1C): Hemoglobin A1C: 5.8

## 2013-10-03 MED ORDER — FUROSEMIDE 20 MG PO TABS: 20.0000 mg | ORAL_TABLET | Freq: Every day | ORAL | Status: DC

## 2013-10-03 MED ORDER — OMEPRAZOLE 40 MG PO CPDR: 40.0000 mg | DELAYED_RELEASE_CAPSULE | Freq: Every day | ORAL | Status: DC

## 2013-10-03 MED ORDER — POTASSIUM CHLORIDE CRYS ER 10 MEQ PO TBCR: 10.0000 meq | EXTENDED_RELEASE_TABLET | Freq: Every day | ORAL | Status: DC

## 2013-10-03 MED ORDER — METFORMIN HCL 500 MG PO TABS: 500.0000 mg | ORAL_TABLET | Freq: Two times a day (BID) | ORAL | Status: DC

## 2013-10-03 MED ORDER — GLUCOSE BLOOD VI STRP: ORAL_STRIP | Status: DC

## 2013-10-03 MED ORDER — FREESTYLE LANCETS MISC: Status: DC

## 2013-10-03 MED ORDER — CARVEDILOL 6.25 MG PO TABS: 6.2500 mg | ORAL_TABLET | Freq: Two times a day (BID) | ORAL | Status: DC

## 2013-10-03 MED ORDER — AMLODIPINE BESYLATE 10 MG PO TABS: 10.0000 mg | ORAL_TABLET | Freq: Every day | ORAL | Status: DC

## 2013-10-03 NOTE — Progress Notes (Signed)
Patient ID: Jane Arellano, female   DOB: [birth year], 53 y.o.   MRN: [id number] Patient Demographics  Jane Arellano, is a 52 y.o. female  [id number]  [id number]  DOB - 04-Mar-1961  Chief Complaint  Patient presents with  . Follow-up        Subjective:   Jane Arellano is a 52 y.o. female here today for a follow up visit. Patient is here for her orange card renewal but also wants to use the opportunity to get a refill her medications. She has no significant complaint today except for her reflux which is acting up lately, no outright chest pain. She claims to be compliant with nutritional control. She has extensive medical history for sub aortic membrane with atrial fibrillation, hypertension and type 2 diabetes, diastolic heart failure, heart murmur. Her orange card expired she could not get her medications refilled. She has no complaint today. No dizziness, no headache. Patient has No chest pain, No abdominal pain - No Nausea, No new weakness tingling or numbness, No Cough - SOB.  ALLERGIES: Allergies  Allergen Reactions  . Ketorolac Tromethamine Other (See Comments)    Unknown reaction    PAST MEDICAL HISTORY: Past Medical History  Diagnosis Date  . Atypical chest pain     a. 11/2005 Negative Myoview  . Subaortic membrane     a. 01/2010 Echo: EF 55-60%, No rwma, subaortic membrane with elevated LVOT mean gradient of 21 mmHg, Triv AI, mod dil LA, mildly increased PASP. b. 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 GERD (gastroesophageal reflux disease)   . HTN (hypertension)   . Iron deficiency anemia   . History of substance abuse   . Paroxysmal atrial fibrillation 05/01/2013    On Xarelto  . Diastolic dysfunction   . Heart murmur   . DM2 (diabetes mellitus, type 2)     MEDICATIONS AT HOME: Prior to  Admission medications   Medication Sig Start Date End Date Taking? Authorizing Provider  amLODipine (NORVASC) 10 MG tablet Take 1 tablet (10 mg total) by mouth daily. 10/03/13  Yes Jeanann Lewandowsky, MD  antiseptic oral rinse (BIOTENE) LIQD 15 mLs by Mouth Rinse route daily.   Yes Historical Provider, MD  Calcium Carb-Cholecalciferol (CALCIUM-VITAMIN D3) 500-400 MG-UNIT TABS Take 1 tablet by mouth daily.   Yes Historical Provider, MD  carvedilol (COREG) 6.25 MG tablet Take 1 tablet (6.25 mg total) by mouth 2 (two) times daily with a meal. 10/03/13  Yes Jeanann Lewandowsky, MD  dabigatran (PRADAXA) 150 MG CAPS capsule Take 1 capsule (150 mg total) by mouth every 12 (twelve) hours. 07/19/13  Yes Beatrice Lecher, PA-C  ferrous sulfate 325 (65 FE) MG tablet Take 1 tablet (325 mg total) by mouth 3 (three) times daily with meals. 07/31/13  Yes Beatrice Lecher, PA-C  fish oil-omega-3 fatty acids 1000 MG capsule Take 1 g by mouth daily.   Yes Historical Provider, MD  furosemide (LASIX) 20 MG tablet Take 1 tablet (20 mg total) by mouth daily. 10/03/13  Yes Jeanann Lewandowsky, MD  glucose blood test strip Use as instructed 10/03/13  Yes Jeanann Lewandowsky, MD  glucose monitoring kit (FREESTYLE) monitoring kit 1 each by Does not apply route as needed for other. 05/09/13  Yes Vassie Loll, MD  Lancets (FREESTYLE) lancets Use as instructed 10/03/13  Yes Jeanann Lewandowsky, MD  metFORMIN (GLUCOPHAGE) 500  MG tablet Take 1 tablet (500 mg total) by mouth 2 (two) times daily with a meal. 10/03/13  Yes Jeanann Lewandowsky, MD  Multiple Vitamins-Minerals (MULTIVITAMIN) tablet Take 1 tablet by mouth daily. 05/09/13  Yes Vassie Loll, MD  potassium chloride (K-DUR,KLOR-CON) 10 MEQ tablet Take 1 tablet (10 mEq total) by mouth daily. 10/03/13  Yes Jeanann Lewandowsky, MD  omeprazole (PRILOSEC) 40 MG capsule Take 1 capsule (40 mg total) by mouth daily. 10/03/13   Jeanann Lewandowsky, MD     Objective:   Filed Vitals:   10/03/13 1014   BP: 143/97  Pulse: 73  Temp: 98.5 F (36.9 C)  TempSrc: Oral  Resp: 16  Height: 5' 6.5" (1.689 m)  Weight: 179 lb (81.194 kg)  SpO2: 97%    Exam General appearance : Awake, alert, not in any distress. Speech Clear. Not toxic looking HEENT: Atraumatic and Normocephalic, pupils equally reactive to light and accomodation Neck: supple, no JVD. No cervical lymphadenopathy.  Chest:Good air entry bilaterally, no added sounds  CVS: S1 S2 irregularly irregular, systolic murmur Abdomen: Bowel sounds present, Non tender and not distended with no gaurding, rigidity or rebound. Extremities: B/L Lower Ext shows no edema, both legs are warm to touch Neurology: Awake alert, and oriented X 3, CN II-XII intact, Non focal Skin:No Rash Wounds:N/A   Data Review   CBC No results found for this basename: WBC, HGB, HCT, PLT, MCV, MCH, MCHC, RDW, NEUTRABS, LYMPHSABS, MONOABS, EOSABS, BASOSABS, BANDABS, BANDSABD,  in the last 168 hours  Chemistries   No results found for this basename: NA, K, CL, CO2, GLUCOSE, BUN, CREATININE, GFRCGP, CALCIUM, MG, AST, ALT, ALKPHOS, BILITOT,  in the last 168 hours ------------------------------------------------------------------------------------------------------------------  Recent Labs  10/03/13 1045  HGBA1C 5.8   ------------------------------------------------------------------------------------------------------------------ No results found for this basename: CHOL, HDL, LDLCALC, TRIG, CHOLHDL, LDLDIRECT,  in the last 72 hours ------------------------------------------------------------------------------------------------------------------ No results found for this basename: TSH, T4TOTAL, FREET3, T3FREE, THYROIDAB,  in the last 72 hours ------------------------------------------------------------------------------------------------------------------ No results found for this basename: VITAMINB12, FOLATE, FERRITIN, TIBC, IRON, RETICCTPCT,  in the last 72  hours  Coagulation profile  No results found for this basename: INR, PROTIME,  in the last 168 hours    Assessment & Plan   Patient Active Problem List   Diagnosis Date Noted  . Diabetes 10/03/2013  . PAF (paroxysmal atrial fibrillation) 05/08/2013  . 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 Atrial fibrillation 05/06/2013  . Hyperglycemia 05/02/2013  . ETOH abuse 05/02/2013  . Marijuana abuse 05/02/2013  . Diastolic dysfunction 05/02/2013  . Depression with anxiety 05/01/2013  . Atrial fibrillation with RVR 05/01/2013  . Palpitations 05/01/2013  . Subaortic membrane   . HTN (hypertension)   . Iron deficiency   . Iron deficiency anemia   . AORTIC VALVE DISORDERS 01/05/2010  . OTHER SPECIFIED CARDIAC DYSRHYTHMIAS 01/05/2010  . 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 HYPERTENSION, ESSENTIAL NOS 04/30/2007  . GERD 04/30/2007     Plan: Refill medications: Amlodipine 10 mg tablet by mouth daily Carvedilol 6.5 mg tablet by mouth twice a day Furosemide 20 mg tablet by mouth daily Metformin 500 mg tablet by mouth twice a day Potassium chloride 10 mEq tablet by mouth daily Prescribe omeprazole 40 mg capsule by mouth daily  Patient has been counseled extensively about nutrition and exercise Patient encouraged to continue abstinence from smoking cigarette and alcohol consumption   Follow up in 4 weeks or when necessary   The patient was given  clear instructions to go to ER or return to medical center if symptoms don't improve, worsen or new problems develop. The patient verbalized understanding. The patient was told to call to get lab results if they haven't heard anything in the next week.    Jeanann Lewandowsky, MD, MHA, FACP, FAAP [facility] [place], [place] [phone]   10/03/2013, 11:05 AM

## 2013-10-03 NOTE — Patient Instructions (Signed)
Diet for Gastroesophageal Reflux Disease, Adult Reflux (acid reflux) is when acid from your stomach flows up into the esophagus. When acid comes in contact with the esophagus, the acid causes irritation and soreness (inflammation) in the esophagus. When reflux happens often or so severely that it causes damage to the esophagus, it is called gastroesophageal reflux disease (GERD). Nutrition therapy can help ease the discomfort of GERD. FOODS OR DRINKS TO AVOID OR LIMIT  Smoking or chewing tobacco. Nicotine is one of the most potent stimulants to acid production in the gastrointestinal tract.  Caffeinated and decaffeinated coffee and black tea.  Regular or low-calorie carbonated beverages or energy drinks (caffeine-free carbonated beverages are allowed).   Strong spices, such as black pepper, white pepper, red pepper, cayenne, curry powder, and chili powder.  Peppermint or spearmint.  Chocolate.  High-fat foods, including meats and fried foods. Extra added fats including oils, butter, salad dressings, and nuts. Limit these to less than 8 tsp per day.  Fruits and vegetables if they are not tolerated, such as citrus fruits or tomatoes.  Alcohol.  Any food that seems to aggravate your condition. If you have questions regarding your diet, call your caregiver or a registered dietitian. OTHER THINGS THAT MAY HELP GERD INCLUDE:   Eating your meals slowly, in a relaxed setting.  Eating 5 to 6 small meals per day instead of 3 large meals.  Eliminating food for a period of time if it causes distress.  Not lying down until 3 hours after eating a meal.  Keeping the head of your bed raised 6 to 9 inches (15 to 23 cm) by using a foam wedge or blocks under the legs of the bed. Lying flat may make symptoms worse.  Being physically active. Weight loss may be helpful in reducing reflux in overweight or obese adults.  Wear loose fitting clothing EXAMPLE MEAL PLAN This meal plan is approximately  2,000 calories based on https://www.bernard.org/ meal planning guidelines. Breakfast   cup cooked oatmeal.  1 cup strawberries.  1 cup low-fat milk.  1 oz almonds. Snack  1 cup cucumber slices.  6 oz yogurt (made from low-fat or fat-free milk). Lunch  2 slice whole-wheat bread.  2 oz sliced [place].  2 tsp mayonnaise.  1 cup blueberries.  1 cup snap peas. Snack  6 whole-wheat crackers.  1 oz string cheese. Dinner   cup brown rice.  1 cup mixed veggies.  1 tsp olive oil.  3 oz grilled fish. Document Released: 11/07/2005 Document Revised: 01/30/2012 Document Reviewed: 09/23/2011 [facility] Patient Information 2014 [place], [place]. Atrial Fibrillation Atrial fibrillation is a type of irregular heart rhythm (arrhythmia). During atrial fibrillation, the upper chambers of the heart (atria) quiver continuously in a chaotic pattern. This causes an irregular and often rapid heart rate.  Atrial fibrillation is the result of the heart becoming overloaded with disorganized signals that tell it to beat. These signals are normally released one at a time by a part of the right atrium called the sinoatrial node. They then travel from the atria to the lower chambers of the heart (ventricles), causing the atria and ventricles to contract and pump blood as they pass. In atrial fibrillation, parts of the atria outside of the sinoatrial node also release these signals. This results in two problems. First, the atria receive so many signals that they do not have time to fully contract. Second, the ventricles, which can only receive one signal at a time, beat irregularly and out of rhythm with  the atria.  There are three types of atrial fibrillation:   Paroxysmal Paroxysmal atrial fibrillation starts suddenly and stops on its own within a week.   Persistent Persistent atrial fibrillation lasts for more than a week. It may stop on its own or with treatment.   Permanent Permanent atrial  fibrillation does not go away. Episodes of atrial fibrillation may lead to permanent atrial fibrillation.  Atrial fibrillation can prevent your heart from pumping blood normally. It increases your risk of stroke and can lead to heart failure.  CAUSES   Heart conditions, including a heart attack, heart failure, coronary artery disease, and heart valve conditions.   Inflammation of the sac that surrounds the heart (pericarditis).   Blockage of an artery in the lungs (pulmonary embolism).   Pneumonia or other infections.   Chronic lung disease.   Thyroid problems, especially if the thyroid is overactive (hyperthyroidism).   Caffeine, excessive alcohol use, and use of some illegal drugs.   Use of some medications, including certain decongestants and diet pills.   Heart surgery.   Birth defects.  Sometimes, no cause can be found. When this happens, the atrial fibrillation is called lone atrial fibrillation. The risk of complications from atrial fibrillation increases if you have lone atrial fibrillation and you are age 53 years or older. RISK FACTORS  Heart failure.  Coronary artery disease  Diabetes mellitus.   High blood pressure (hypertension).   Obesity.   Other arrhythmias.   Increased age. SYMPTOMS   A feeling that your heart is beating rapidly or irregularly.   A feeling of discomfort or pain in your chest.   Shortness of breath.   Sudden lightheadedness or weakness.   Getting tired easily when exercising.   Urinating more often than normal (mainly when atrial fibrillation first begins).  In paroxysmal atrial fibrillation, symptoms may start and suddenly stop. DIAGNOSIS  Your caregiver may be able to detect atrial fibrillation when taking your pulse. Usually, testing is needed to diagnosis atrial fibrillation. Tests may include:   Electrocardiography. During this test, the electrical impulses of your heart are recorded while you are lying  down.   Echocardiography. During echocardiography, sound waves are used to evaluate how blood flows through your heart.   Stress test. There is more than one type of stress test. If a stress test is needed, ask your caregiver about which type is best for you.   Chest X-ray exam.   Blood tests.   Computed tomography (CT).  TREATMENT   Treating any underlying conditions. For example, if you have an overactive thyroid, treating the condition may correct atrial fibrillation.   Medication. Medications may be given to control a rapid heart rate or to prevent blood clots, heart failure, or a stroke.   Procedure to correct the rhythm of the heart:  Electrical cardioversion. During electrical cardioversion, a controlled, low-energy shock is delivered to the heart through your skin. If you have chest pain, very low pressure blood pressure, or sudden heart failure, this procedure may need to be done as an emergency.  Catheter ablation. During this procedure, heart tissues that send the signals that cause atrial fibrillation are destroyed.  Maze or minimaze procedure. During this surgery, thin lines of heart tissue that carry the abnormal signals are destroyed. The maze procedure is an open-heart surgery. The minimaze procedure is a minimally invasive surgery. This means that small cuts are made to access the heart instead of a large opening.  Pulmonary venous isolation. During this  surgery, tissue around the veins that carry blood from the lungs (pulmonary veins) is destroyed. This tissue is thought to carry the abnormal signals. HOME CARE INSTRUCTIONS   Take medications as directed by your caregiver.  Only take medications that your caregiver approves. Some medications can make atrial fibrillation worse or recur.  If blood thinners were prescribed by your caregiver, take them exactly as directed. Too much can cause bleeding. Too little and you will not have the needed protection against  stroke and other problems.  Perform blood tests at home if directed by your caregiver.  Perform blood tests exactly as directed.   Quit smoking if you smoke.   Do not drink alcohol.   Do not drink caffeinated beverages such as coffee, soda, and some teas. You may drink decaffeinated coffee, soda, or tea.   Maintain a healthy weight. Do not use diet pills unless your caregiver approves. They may make heart problems worse.   Follow diet instructions as directed by your caregiver.   Exercise regularly as directed by your caregiver.   Keep all follow-up appointments. PREVENTION  The following substances can cause atrial fibrillation to recur:   Caffeinated beverages.   Alcohol.   Certain medications, especially those used for breathing problems.   Certain herbs and herbal medications, such as those containing ephedra or ginseng.  Illegal drugs such as cocaine and amphetamines. Sometimes medications are given to prevent atrial fibrillation from recurring. Proper treatment of any underlying condition is also important in helping prevent recurrence.  SEEK MEDICAL CARE IF:  You notice a change in the rate, rhythm, or strength of your heartbeat.   You suddenly begin urinating more frequently.   You tire more easily when exerting yourself or exercising.  SEEK IMMEDIATE MEDICAL CARE IF:   You develop chest pain, abdominal pain, sweating, or weakness.  You feel sick to your stomach (nauseous).  You develop shortness of breath.  You suddenly develop swollen feet and ankles.  You feel dizzy.  You face or limbs feel numb or weak.  There is a change in your vision or speech. MAKE SURE YOU:   Understand these instructions.  Will watch your condition.  Will get help right away if you are not doing well or get worse. Document Released: 11/07/2005 Document Revised: 03/04/2013 Document Reviewed: 12/18/2012 [facility] Patient Information 2014 [place], [place].

## 2013-10-03 NOTE — Progress Notes (Signed)
Pt is still suffering from GERD. Medication is not helping.

## 2013-10-04 NOTE — Telephone Encounter (Signed)
Patient has been seen in the clinic

## 2013-10-11 ENCOUNTER — Ambulatory Visit: Payer: Self-pay | Admitting: Internal Medicine

## 2013-10-28 ENCOUNTER — Other Ambulatory Visit: Payer: Self-pay | Admitting: Emergency Medicine

## 2013-10-28 DIAGNOSIS — R002 Palpitations: Secondary | ICD-10-CM

## 2013-10-28 DIAGNOSIS — I4891 Unspecified atrial fibrillation: Secondary | ICD-10-CM

## 2013-10-28 DIAGNOSIS — E119 Type 2 diabetes mellitus without complications: Secondary | ICD-10-CM

## 2013-10-28 DIAGNOSIS — F1010 Alcohol abuse, uncomplicated: Secondary | ICD-10-CM

## 2013-10-28 DIAGNOSIS — Q244 Congenital subaortic stenosis: Secondary | ICD-10-CM

## 2013-10-28 DIAGNOSIS — I10 Essential (primary) hypertension: Secondary | ICD-10-CM

## 2013-10-28 DIAGNOSIS — F418 Other specified anxiety disorders: Secondary | ICD-10-CM

## 2013-10-28 MED ORDER — AMLODIPINE BESYLATE 10 MG PO TABS: 10.0000 mg | ORAL_TABLET | Freq: Every day | ORAL | Status: DC

## 2013-10-30 ENCOUNTER — Encounter: Payer: Self-pay | Admitting: *Deleted

## 2013-10-30 ENCOUNTER — Ambulatory Visit (INDEPENDENT_AMBULATORY_CARE_PROVIDER_SITE_OTHER): Payer: No Typology Code available for payment source | Admitting: Internal Medicine

## 2013-10-30 ENCOUNTER — Encounter: Payer: Self-pay | Admitting: Internal Medicine

## 2013-10-30 VITALS — BP 116/82 | HR 124 | Ht 66.5 in | Wt 179.0 lb

## 2013-10-30 DIAGNOSIS — I4891 Unspecified atrial fibrillation: Secondary | ICD-10-CM

## 2013-10-30 DIAGNOSIS — I422 Other hypertrophic cardiomyopathy: Secondary | ICD-10-CM

## 2013-10-30 LAB — CBC WITH DIFFERENTIAL/PLATELET
Basophils Relative: 0.4 % (ref 0.0–3.0)
Eosinophils Absolute: 0 10*3/uL (ref 0.0–0.7)
Eosinophils Relative: 0.7 % (ref 0.0–5.0)
MCHC: 32.8 g/dL (ref 30.0–36.0)
MCV: 93.8 fl (ref 78.0–100.0)
Monocytes Absolute: 0.2 10*3/uL (ref 0.1–1.0)
Monocytes Relative: 4.2 % (ref 3.0–12.0)
Neutrophils Relative %: 64 % (ref 43.0–77.0)
Platelets: 225 10*3/uL (ref 150.0–400.0)
RBC: 4.26 Mil/uL (ref 3.87–5.11)
WBC: 5 10*3/uL (ref 4.5–10.5)

## 2013-10-30 LAB — BASIC METABOLIC PANEL
Calcium: 10 mg/dL (ref 8.4–10.5)
Chloride: 106 mEq/L (ref 96–112)
Creatinine, Ser: 0.7 mg/dL (ref 0.4–1.2)
GFR: 120.29 mL/min (ref >60.00)
Potassium: 4 mEq/L (ref 3.5–5.1)

## 2013-10-30 NOTE — Patient Instructions (Addendum)
Your physician recommends that you have lab work today: BMET  Your physician has requested that you have cardiac CT - for subaortic membrane. Cardiac computed tomography (CT) is a painless test that uses an x-ray machine to take clear, detailed pictures of your heart. For further information please visit [email or web address] Please follow instruction sheet as given.  Your physician has recommended that you have a Cardioversion (DCCV). Electrical Cardioversion uses a jolt of electricity to your heart either through paddles or wired patches attached to your chest. This is a controlled, usually prescheduled, procedure. Defibrillation is done under light anesthesia in the hospital, and you usually go home the day of the procedure. This is done to get your heart back into a normal rhythm. You are not awake for the procedure. Please see the instruction sheet given to you today. This is scheduled for Monday at 2pm, be at hospital at 12 noon. See instruction sheet given to you.  Your physician recommends that you schedule a follow-up appointment in: 2 weeks with Dr. Laneta Simmers  Your physician recommends that you schedule a follow-up appointment in: 8 weeks with Dr. Graciela Husbands.

## 2013-10-30 NOTE — Progress Notes (Signed)
Patient Care Team: Jeanann Lewandowsky, MD as PCP - General (Internal Medicine)   HPI  Jane Arellano is a 52 y.o. 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 She was seen by Dr. Sharman Cheek in July; cardiac CT was recommended but not consummated. Followup did not occur.  She comes in today in cardiac followup; she is in atrial fibrillation of which she is unaware. She has noted no change in exercise tolerance.  She is taking her medications regularly including her dabigitran Past Medical History  Diagnosis Date  . Atypical chest pain     a. 11/2005 Negative Myoview  . Subaortic membrane     a. 01/2010 Echo: EF 55-60%, No rwma, subaortic membrane with elevated LVOT mean gradient of 21 mmHg, Triv AI, mod dil LA, mildly increased PASP. b. 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 GERD (gastroesophageal reflux disease)   . HTN (hypertension)   . Iron deficiency anemia   . History of substance abuse   . Paroxysmal atrial fibrillation 05/01/2013    On Xarelto  . Diastolic dysfunction   . Heart murmur   . DM2 (diabetes mellitus, type 2)     Past Surgical History  Procedure Laterality Date  . Cesarean section      Current Outpatient Prescriptions  Medication Sig Dispense Refill  . amLODipine (NORVASC) 10 MG tablet Take 1 tablet (10 mg total) by mouth daily.  30 tablet  2  . antiseptic oral rinse (BIOTENE) LIQD 15 mLs by Mouth Rinse route daily.      . Calcium Carb-Cholecalciferol (CALCIUM-VITAMIN D3) 500-400 MG-UNIT TABS Take 1 tablet by mouth daily.      . carvedilol (COREG) 6.25 MG tablet Take 1 tablet (6.25 mg total) by mouth 2 (two) times daily with a meal.  60 tablet  0  . dabigatran (PRADAXA) 150 MG  CAPS capsule Take 1 capsule (150 mg total) by mouth every 12 (twelve) hours.  180 capsule  3  . ferrous sulfate 325 (65 FE) MG tablet Take 1 tablet (325 mg total) by mouth 3 (three) times daily with meals.      . fish oil-omega-3 fatty acids 1000 MG capsule Take 1 g by mouth daily.      . furosemide (LASIX) 20 MG tablet Take 1 tablet (20 mg total) by mouth daily.  30 tablet  0  . glucose blood test strip Use as instructed  100 each  12  . glucose monitoring kit (FREESTYLE) monitoring kit 1 each by Does not apply route as needed for other.  1 each  0  . Lancets (FREESTYLE) lancets Use as instructed  100 each  12  . metFORMIN (GLUCOPHAGE) 500 MG tablet Take 1 tablet (500 mg total) by mouth 2 (two) times daily with a meal.  60 tablet  0  . Multiple Vitamins-Minerals (MULTIVITAMIN) tablet Take 1 tablet by mouth daily.      Marland Kitchen omeprazole (PRILOSEC) 40 MG capsule Take 1 capsule (40 mg total) by mouth daily.  30 capsule  3  . potassium chloride (K-DUR,KLOR-CON) 10 MEQ tablet Take 1 tablet (10 mEq total) by mouth daily.  30 tablet  0   No current facility-administered medications for this visit.  Allergies  Allergen Reactions  . Ketorolac Tromethamine Other (See Comments)    Unknown reaction    Review of Systems negative except from HPI and PMH  Physical Exam BP 116/82  Pulse 124  Ht 5' 6.5" (1.689 m)  Wt 179 lb (81.194 kg)  BMI 28.46 kg/m2 Well developed and well nourished in no acute distress HENT normal E scleral and icterus clear Neck Supple JVP flat; carotids brisk and full Clear to ausculation Irregularly irregular rhythm with a 2-3/6 systolic murmur Soft with active bowel sounds No clubbing cyanosis none Edema Alert and oriented, grossly normal motor and sensory function Skin Warm and Dry  ECG demonstrates atrial fibrillation with a rapid rate Intervals-/08/33    Assessment and  Plan

## 2013-10-30 NOTE — Assessment & Plan Note (Signed)
She has recurrent atrial fibrillation with a rapid rate. We'll undertake cardioversion next week. We have reviewed how to take her pulse and she will take it regularly following cardioversion thought to be able to help [place] know when it is in atrial fibrillation reoccurs. That interval will inform decision-making  As to  Therapy.  For now she will continue on dabigitran

## 2013-10-30 NOTE — Assessment & Plan Note (Addendum)
She needs followup for her subaortic membrane. We will arrange for cardiac CT and arrange followup with Dr. Sharman Cheek.

## 2013-10-31 ENCOUNTER — Encounter: Payer: Self-pay | Admitting: *Deleted

## 2013-11-04 ENCOUNTER — Encounter (HOSPITAL_COMMUNITY): Payer: No Typology Code available for payment source | Admitting: Anesthesiology

## 2013-11-04 ENCOUNTER — Encounter (HOSPITAL_COMMUNITY): Payer: Self-pay | Admitting: Gastroenterology

## 2013-11-04 ENCOUNTER — Other Ambulatory Visit: Payer: Self-pay | Admitting: Emergency Medicine

## 2013-11-04 ENCOUNTER — Ambulatory Visit (HOSPITAL_COMMUNITY): Payer: No Typology Code available for payment source | Admitting: Anesthesiology

## 2013-11-04 ENCOUNTER — Ambulatory Visit (HOSPITAL_COMMUNITY)
Admission: RE | Admit: 2013-11-04 | Discharge: 2013-11-04 | Disposition: A | Discharge status: Home / Self Care | Payer: No Typology Code available for payment source | Source: Ambulatory Visit | Attending: Cardiology | Admitting: Cardiology

## 2013-11-04 ENCOUNTER — Encounter (HOSPITAL_COMMUNITY)
Admission: RE | Disposition: A | Discharge status: Home / Self Care | Payer: Self-pay | Source: Ambulatory Visit | Attending: Cardiology

## 2013-11-04 DIAGNOSIS — I4891 Unspecified atrial fibrillation: Secondary | ICD-10-CM | POA: Insufficient documentation

## 2013-11-04 DIAGNOSIS — Q244 Congenital subaortic stenosis: Secondary | ICD-10-CM

## 2013-11-04 DIAGNOSIS — I519 Heart disease, unspecified: Secondary | ICD-10-CM | POA: Insufficient documentation

## 2013-11-04 DIAGNOSIS — R002 Palpitations: Secondary | ICD-10-CM

## 2013-11-04 DIAGNOSIS — F1010 Alcohol abuse, uncomplicated: Secondary | ICD-10-CM

## 2013-11-04 DIAGNOSIS — E119 Type 2 diabetes mellitus without complications: Secondary | ICD-10-CM

## 2013-11-04 DIAGNOSIS — I10 Essential (primary) hypertension: Secondary | ICD-10-CM | POA: Insufficient documentation

## 2013-11-04 DIAGNOSIS — F418 Other specified anxiety disorders: Secondary | ICD-10-CM

## 2013-11-04 DIAGNOSIS — Z7901 Long term (current) use of anticoagulants: Secondary | ICD-10-CM | POA: Insufficient documentation

## 2013-11-04 HISTORY — PX: CARDIOVERSION: SHX1299

## 2013-11-04 LAB — GLUCOSE, CAPILLARY: Glucose-Capillary: 95 mg/dL (ref 70–99)

## 2013-11-04 SURGERY — CARDIOVERSION
Anesthesia: General

## 2013-11-04 MED ORDER — PROPOFOL 10 MG/ML IV BOLUS
INTRAVENOUS | Status: DC | PRN
  Administered 2013-11-04: 100 mg via INTRAVENOUS

## 2013-11-04 MED ORDER — SODIUM CHLORIDE 0.9 % IV SOLN
INTRAVENOUS | Status: DC
  Administered 2013-11-04: 500 mL via INTRAVENOUS
  Administered 2013-11-04: 14:00:00 via INTRAVENOUS

## 2013-11-04 MED ORDER — FUROSEMIDE 20 MG PO TABS: 20.0000 mg | ORAL_TABLET | Freq: Every day | ORAL | Status: DC

## 2013-11-04 NOTE — Interval H&P Note (Signed)
History and Physical Interval Note:  11/04/2013 2:01 PM  Jane Arellano  has presented today for surgery, with the diagnosis of A FIB  The various methods of treatment have been discussed with the patient and family. After consideration of risks, benefits and other options for treatment, the patient has consented to  Procedure(s): CARDIOVERSION (N/A) as a surgical intervention .  The patient's history has been reviewed, patient examined, no change in status, stable for surgery.  I have reviewed the patient's chart and labs.  Questions were answered to the patient's satisfaction.     Theron Arista [facility] 11/04/2013 2:01 PM

## 2013-11-04 NOTE — H&P (View-Only) (Signed)
    Patient Care Team: Olugbemiga Jegede, MD as PCP - General (Internal Medicine)   HPI  Jane Arellano is a 52 y.o. 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 She was seen by Dr. BB in July; cardiac CT was recommended but not consummated. Followup did not occur.  She comes in today in cardiac followup; she is in atrial fibrillation of which she is unaware. She has noted no change in exercise tolerance.  She is taking her medications regularly including her dabigitran Past Medical History  Diagnosis Date  . Atypical chest pain     a. 11/2005 Negative Myoview  . Subaortic membrane     a. 01/2010 Echo: EF 55-60%, No rwma, subaortic membrane with elevated LVOT mean gradient of 21 mmHg, Triv AI, mod dil LA, mildly increased PASP. b. 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 GERD (gastroesophageal reflux disease)   . HTN (hypertension)   . Iron deficiency anemia   . History of substance abuse   . Paroxysmal atrial fibrillation 05/01/2013    On Xarelto  . Diastolic dysfunction   . Heart murmur   . DM2 (diabetes mellitus, type 2)     Past Surgical History  Procedure Laterality Date  . Cesarean section      Current Outpatient Prescriptions  Medication Sig Dispense Refill  . amLODipine (NORVASC) 10 MG tablet Take 1 tablet (10 mg total) by mouth daily.  30 tablet  2  . antiseptic oral rinse (BIOTENE) LIQD 15 mLs by Mouth Rinse route daily.      . Calcium Carb-Cholecalciferol (CALCIUM-VITAMIN D3) 500-400 MG-UNIT TABS Take 1 tablet by mouth daily.      . carvedilol (COREG) 6.25 MG tablet Take 1 tablet (6.25 mg total) by mouth 2 (two) times daily with a meal.  60 tablet  0  . dabigatran (PRADAXA) 150 MG  CAPS capsule Take 1 capsule (150 mg total) by mouth every 12 (twelve) hours.  180 capsule  3  . ferrous sulfate 325 (65 FE) MG tablet Take 1 tablet (325 mg total) by mouth 3 (three) times daily with meals.      . fish oil-omega-3 fatty acids 1000 MG capsule Take 1 g by mouth daily.      . furosemide (LASIX) 20 MG tablet Take 1 tablet (20 mg total) by mouth daily.  30 tablet  0  . glucose blood test strip Use as instructed  100 each  12  . glucose monitoring kit (FREESTYLE) monitoring kit 1 each by Does not apply route as needed for other.  1 each  0  . Lancets (FREESTYLE) lancets Use as instructed  100 each  12  . metFORMIN (GLUCOPHAGE) 500 MG tablet Take 1 tablet (500 mg total) by mouth 2 (two) times daily with a meal.  60 tablet  0  . Multiple Vitamins-Minerals (MULTIVITAMIN) tablet Take 1 tablet by mouth daily.      . omeprazole (PRILOSEC) 40 MG capsule Take 1 capsule (40 mg total) by mouth daily.  30 capsule  3  . potassium chloride (K-DUR,KLOR-CON) 10 MEQ tablet Take 1 tablet (10 mEq total) by mouth daily.  30 tablet  0   No current facility-administered medications for this visit.      Allergies  Allergen Reactions  . Ketorolac Tromethamine Other (See Comments)    Unknown reaction    Review of Systems negative except from HPI and PMH  Physical Exam BP 116/82  Pulse 124  Ht 5' 6.5" (1.689 m)  Wt 179 lb (81.194 kg)  BMI 28.46 kg/m2 Well developed and well nourished in no acute distress HENT normal E scleral and icterus clear Neck Supple JVP flat; carotids brisk and full Clear to ausculation Irregularly irregular rhythm with a 2-3/6 systolic murmur Soft with active bowel sounds No clubbing cyanosis none Edema Alert and oriented, grossly normal motor and sensory function Skin Warm and Dry  ECG demonstrates atrial fibrillation with a rapid rate Intervals-/08/33    Assessment and  Plan  

## 2013-11-04 NOTE — Anesthesia Preprocedure Evaluation (Signed)
Anesthesia Evaluation  Patient identified by MRN, date of birth, ID band Patient awake    Reviewed: Allergy & Precautions, H&P , NPO status , Patient's Chart, lab work & pertinent test results, reviewed documented beta blocker date and time   Airway Mallampati: II TM Distance: >3 FB Neck ROM: full    Dental   Pulmonary neg pulmonary ROS,  breath sounds clear to auscultation        Cardiovascular hypertension, On Medications and On Home Beta Blockers + dysrhythmias Atrial Fibrillation + Valvular Problems/Murmurs AS Rhythm:regular     Neuro/Psych PSYCHIATRIC DISORDERS negative neurological ROS     GI/Hepatic negative GI ROS, Neg liver ROS, GERD-  Medicated and Controlled,(+)     substance abuse  cocaine use,   Endo/Other  diabetes, Oral Hypoglycemic Agents  Renal/GU negative Renal ROS  negative genitourinary   Musculoskeletal   Abdominal   Peds  Hematology  (+) anemia ,   Anesthesia Other Findings See surgeon's H&P   Reproductive/Obstetrics negative OB ROS                           Anesthesia Physical Anesthesia Plan  ASA: III  Anesthesia Plan: General   Post-op Pain Management:    Induction: Intravenous  Airway Management Planned: Mask  Additional Equipment:   Intra-op Plan:   Post-operative Plan:   Informed Consent: I have reviewed the patients History and Physical, chart, labs and discussed the procedure including the risks, benefits and alternatives for the proposed anesthesia with the patient or authorized representative who has indicated his/her understanding and acceptance.   Dental Advisory Given  Plan Discussed with: CRNA and Surgeon  Anesthesia Plan Comments:         Anesthesia Quick Evaluation

## 2013-11-04 NOTE — CV Procedure (Signed)
    Electrical Cardioversion Procedure Note Jane Arellano [id number] Feb 17, 1961  Procedure: Electrical Cardioversion Indications:  Atrial Fibrillation  Procedure Details Consent: Risks of procedure as well as the alternatives and risks of each were explained to the (patient/caregiver).  Consent for procedure obtained. Time Out: Verified patient identification, verified procedure, site/side was marked, verified correct patient position, special equipment/implants available, medications/allergies/relevent history reviewed, required imaging and test results available.  Performed  Patient placed on cardiac monitor, pulse oximetry, supplemental oxygen as necessary.  Sedation given: Short-acting barbiturates Pacer pads placed anterior and posterior chest.  Cardioverted 1 time(s).  Cardioverted at 120J.  Evaluation Findings: Post procedure EKG shows: NSR Complications: None Patient did tolerate procedure well.   Jane Arellano [facility] 11/04/2013, 2:08 PM

## 2013-11-04 NOTE — Anesthesia Postprocedure Evaluation (Signed)
Anesthesia Post Note  Patient: Jane Arellano  Procedure(s) Performed: Procedure(s) (LRB): CARDIOVERSION (N/A)  Anesthesia type: General  Patient location: PACU  Post pain: Pain level controlled  Post assessment: Patient's Cardiovascular Status Stable  Last Vitals:  Filed Vitals:   11/04/13 1421  BP: 106/76  Pulse: 69  Temp:   Resp: 12    Post vital signs: Reviewed and stable  Level of consciousness: alert  Complications: No apparent anesthesia complications

## 2013-11-04 NOTE — Transfer of Care (Signed)
Immediate Anesthesia Transfer of Care Note  Patient: Jane Arellano  Procedure(s) Performed: Procedure(s): CARDIOVERSION (N/A)  Patient Location: Endoscopy Unit  Anesthesia Type:General  Level of Consciousness: awake, alert  and oriented  Airway & Oxygen Therapy: Patient Spontanous Breathing  Post-op Assessment: Report given to PACU RN  Post vital signs: Reviewed and stable  Complications: No apparent anesthesia complications

## 2013-11-05 ENCOUNTER — Encounter (HOSPITAL_COMMUNITY): Payer: Self-pay | Admitting: Cardiology

## 2013-11-06 ENCOUNTER — Telehealth: Payer: Self-pay | Admitting: Emergency Medicine

## 2013-11-06 DIAGNOSIS — Q244 Congenital subaortic stenosis: Secondary | ICD-10-CM

## 2013-11-06 DIAGNOSIS — F418 Other specified anxiety disorders: Secondary | ICD-10-CM

## 2013-11-06 DIAGNOSIS — F1010 Alcohol abuse, uncomplicated: Secondary | ICD-10-CM

## 2013-11-06 DIAGNOSIS — I10 Essential (primary) hypertension: Secondary | ICD-10-CM

## 2013-11-06 DIAGNOSIS — E119 Type 2 diabetes mellitus without complications: Secondary | ICD-10-CM

## 2013-11-06 DIAGNOSIS — I4891 Unspecified atrial fibrillation: Secondary | ICD-10-CM

## 2013-11-06 DIAGNOSIS — R002 Palpitations: Secondary | ICD-10-CM

## 2013-11-06 MED ORDER — CARVEDILOL 6.25 MG PO TABS: 6.2500 mg | ORAL_TABLET | Freq: Two times a day (BID) | ORAL | Status: DC

## 2013-11-06 MED ORDER — METFORMIN HCL 500 MG PO TABS: 500.0000 mg | ORAL_TABLET | Freq: Two times a day (BID) | ORAL | Status: DC

## 2013-11-06 MED ORDER — FUROSEMIDE 20 MG PO TABS: 20.0000 mg | ORAL_TABLET | Freq: Every day | ORAL | Status: DC

## 2013-11-06 MED ORDER — POTASSIUM CHLORIDE CRYS ER 10 MEQ PO TBCR: 10.0000 meq | EXTENDED_RELEASE_TABLET | Freq: Every day | ORAL | Status: DC

## 2013-11-08 ENCOUNTER — Ambulatory Visit (HOSPITAL_COMMUNITY)
Admission: RE | Admit: 2013-11-08 | Discharge: 2013-11-08 | Disposition: A | Discharge status: Home / Self Care | Payer: No Typology Code available for payment source | Source: Ambulatory Visit | Attending: Surgery | Admitting: Surgery

## 2013-11-08 DIAGNOSIS — I359 Nonrheumatic aortic valve disorder, unspecified: Secondary | ICD-10-CM

## 2013-11-08 DIAGNOSIS — I319 Disease of pericardium, unspecified: Secondary | ICD-10-CM | POA: Insufficient documentation

## 2013-11-08 DIAGNOSIS — Z01818 Encounter for other preprocedural examination: Secondary | ICD-10-CM | POA: Insufficient documentation

## 2013-11-08 MED ORDER — NITROGLYCERIN 0.4 MG SL SUBL
0.4000 mg | SUBLINGUAL_TABLET | SUBLINGUAL | Status: DC | PRN
  Administered 2013-11-08: 0.4 mg via SUBLINGUAL

## 2013-11-08 MED ORDER — IOHEXOL 350 MG/ML SOLN
80.0000 mL | Freq: Once | INTRAVENOUS | Status: AC | PRN
  Administered 2013-11-08: 80 mL via INTRAVENOUS

## 2013-11-08 MED ORDER — METOPROLOL TARTRATE 1 MG/ML IV SOLN
INTRAVENOUS | Status: AC
  Filled 2013-11-08: qty 10

## 2013-11-08 MED ORDER — NITROGLYCERIN 0.4 MG SL SUBL
SUBLINGUAL_TABLET | SUBLINGUAL | Status: AC
  Administered 2013-11-08: 0.4 mg via SUBLINGUAL
  Filled 2013-11-08: qty 25

## 2013-11-20 ENCOUNTER — Ambulatory Visit: Payer: No Typology Code available for payment source | Admitting: Surgery

## 2013-11-22 ENCOUNTER — Encounter: Payer: Self-pay | Admitting: Internal Medicine

## 2013-11-22 ENCOUNTER — Ambulatory Visit (INDEPENDENT_AMBULATORY_CARE_PROVIDER_SITE_OTHER): Payer: No Typology Code available for payment source | Admitting: Internal Medicine

## 2013-11-22 VITALS — BP 130/86 | HR 68 | Ht 66.5 in | Wt 174.0 lb

## 2013-11-22 DIAGNOSIS — I480 Paroxysmal atrial fibrillation: Secondary | ICD-10-CM

## 2013-11-22 DIAGNOSIS — Q244 Congenital subaortic stenosis: Secondary | ICD-10-CM

## 2013-11-22 DIAGNOSIS — I4891 Unspecified atrial fibrillation: Secondary | ICD-10-CM

## 2013-11-22 DIAGNOSIS — I422 Other hypertrophic cardiomyopathy: Secondary | ICD-10-CM

## 2013-11-22 NOTE — Assessment & Plan Note (Signed)
No clinical recurrences. We will follow this along. For now we will withhold antiarrhythmic therapy

## 2013-11-22 NOTE — Progress Notes (Signed)
Patient Care Team: Angelica Chessman, MD as PCP - General (Internal Medicine)   HPI  Jane Arellano is a 53 y.o. female Seen in followup for recurrent atrial fibrillation for which he underwent cardioversion in early December.  She has a history of subaortic membrane for which cardia CT surgery appt is scheduled next week for discussion of surgery  Feels better post cardioversion without any palpated recurrent atrial fibrillation    Past Medical History  Diagnosis Date  . Atypical chest pain     a. 11/2005 Negative Myoview  . Subaortic membrane     a. 01/2010 Echo: EF 55-60%, No rwma, subaortic membrane with elevated LVOT mean gradient of 21 mmHg, Triv AI, mod dil LA, mildly increased PASP. b. 05/02/2013 Echo:  LVEF 65-78%, grade 1 diastolic dysfunction, mild LVH, subaortic stenosis w/ turbulation in LVOT c/w subaortic membrane (mean gradient 42 mmHg/peak gradient 81 mmHg), mild biatrial enlargement  . GERD (gastroesophageal reflux disease)   . HTN (hypertension)   . Iron deficiency anemia   . History of substance abuse   . Paroxysmal atrial fibrillation 05/01/2013    On Xarelto  . Diastolic dysfunction   . Heart murmur   . DM2 (diabetes mellitus, type 2)     Past Surgical History  Procedure Laterality Date  . Cesarean section    . Cardioversion N/A 11/04/2013    Procedure: CARDIOVERSION;  Surgeon: Peter M Martinique, MD;  Location: [facility] ENDOSCOPY;  Service: Cardiovascular;  Laterality: N/A;    Current Outpatient Prescriptions  Medication Sig Dispense Refill  . amLODipine (NORVASC) 10 MG tablet Take 1 tablet (10 mg total) by mouth daily.  30 tablet  2  . antiseptic oral rinse (BIOTENE) LIQD 15 mLs by Mouth Rinse route daily.      . Calcium Carb-Cholecalciferol (CALCIUM-VITAMIN D3) 500-400 MG-UNIT TABS Take 1 tablet by mouth daily.      . carvedilol (COREG) 6.25 MG tablet Take 1 tablet (6.25 mg total) by mouth 2 (two) times daily with a meal.  60 tablet  1  .  dabigatran (PRADAXA) 150 MG CAPS capsule Take 1 capsule (150 mg total) by mouth every 12 (twelve) hours.  180 capsule  3  . ferrous sulfate 325 (65 FE) MG tablet Take 1 tablet (325 mg total) by mouth 3 (three) times daily with meals.      . fish oil-omega-3 fatty acids 1000 MG capsule Take 1 g by mouth daily.      . furosemide (LASIX) 20 MG tablet Take 1 tablet (20 mg total) by mouth daily.  30 tablet  0  . glucose blood test strip Use as instructed  100 each  12  . glucose monitoring kit (FREESTYLE) monitoring kit 1 each by Does not apply route as needed for other.  1 each  0  . Lancets (FREESTYLE) lancets Use as instructed  100 each  12  . metFORMIN (GLUCOPHAGE) 500 MG tablet Take 1 tablet (500 mg total) by mouth 2 (two) times daily with a meal.  60 tablet  1  . Multiple Vitamins-Minerals (MULTIVITAMIN) tablet Take 1 tablet by mouth daily.      Marland Kitchen omeprazole (PRILOSEC) 40 MG capsule Take 1 capsule (40 mg total) by mouth daily.  30 capsule  3  . potassium chloride (K-DUR,KLOR-CON) 10 MEQ tablet Take 1 tablet (10 mEq total) by mouth daily.  30 tablet  0   No current facility-administered medications for this visit.    Allergies  Allergen Reactions  .  Ketorolac Tromethamine Other (See Comments)    Unknown reaction    Review of Systems negative except from HPI and PMH  Physical Exam BP 130/86  Pulse 68  Ht 5' 6.5" (1.689 m)  Wt 174 lb (78.926 kg)  BMI 27.67 kg/m2 Well developed and well nourished in no acute distress HENT normal E scleral and icterus clear Neck Supple JVP flat; carotids brisk and full Clear to ausculation r egular rate and rhythm, 3/6 harsh systolic murmur with a split S2 Soft with active bowel sounds No clubbing cyanosis none Edema Alert and oriented, grossly normal motor and sensory function Skin Warm and Dry    Assessment and  Plan

## 2013-11-22 NOTE — Patient Instructions (Signed)
Your physician recommends that you continue on your current medications as directed. Please refer to the Current Medication list given to you today.  Your physician wants you to follow-up in: 6 months with Dr. Klein. You will receive a reminder letter in the mail two months in advance. If you don't receive a letter, please call our office to schedule the follow-up appointment.  

## 2013-11-22 NOTE — Assessment & Plan Note (Signed)
She is scheduled to see Dr. Lavinia SharpsBartel next week for consideration of surgical repair. Scanning done in July was reviewed and apparently was felt to be consistent with a valve sparing surgical potential

## 2013-11-27 ENCOUNTER — Ambulatory Visit (INDEPENDENT_AMBULATORY_CARE_PROVIDER_SITE_OTHER): Payer: No Typology Code available for payment source | Admitting: Surgery

## 2013-11-27 ENCOUNTER — Encounter: Payer: Self-pay | Admitting: Surgery

## 2013-11-27 VITALS — BP 133/79 | HR 61 | Resp 16 | Ht 66.5 in | Wt 174.0 lb

## 2013-11-27 DIAGNOSIS — Z8679 Personal history of other diseases of the circulatory system: Secondary | ICD-10-CM

## 2013-11-27 DIAGNOSIS — Q244 Congenital subaortic stenosis: Secondary | ICD-10-CM

## 2013-11-27 NOTE — Progress Notes (Signed)
HPI:  The patient is a 53 year old woman who presented in 2007 with atypical chest pain and ruled out for MI. She had a negative Myoview. She was noted to have a murmur and 2D echo followed by TEE showed what appeared to be a subaortic membrane with a velocity across this membrane of 4 m/s. The valve looked trileaflet with no stenosis and trivial regurgitation. She had a follow up echo in 01/2010 which showed a mean gradient across the LVOT of 21 mm Hg. She did not return for followup until she returned with rapid atrial fibrillation.She was treated with rate control and anticoagulation and converted to sinus. She was intially on Xarelto but developed some bleeding from gums and was switched to Pradaxa. Repeat 2D echo 05/02/2013 showed an increase in the subaortic gradient to a mean of 42 and a peak of 81 with mild LVH and normal systolic function with an EF of 60-65%. There was trivial AI and no MR. She says she is asymptomatic although she will also admit to occasional episodes of exertional dyspnea. She recently presented with recurrent atrial fibrillation and was cardioverted.   Current Outpatient Prescriptions  Medication Sig Dispense Refill  . amLODipine (NORVASC) 10 MG tablet Take 1 tablet (10 mg total) by mouth daily.  30 tablet  2  . antiseptic oral rinse (BIOTENE) LIQD 15 mLs by Mouth Rinse route daily.      . Calcium Carb-Cholecalciferol (CALCIUM-VITAMIN D3) 500-400 MG-UNIT TABS Take 1 tablet by mouth daily.      . carvedilol (COREG) 6.25 MG tablet Take 1 tablet (6.25 mg total) by mouth 2 (two) times daily with a meal.  60 tablet  1  . dabigatran (PRADAXA) 150 MG CAPS capsule Take 1 capsule (150 mg total) by mouth every 12 (twelve) hours.  180 capsule  3  . ferrous sulfate 325 (65 FE) MG tablet Take 1 tablet (325 mg total) by mouth 3 (three) times daily with meals.      . fish oil-omega-3 fatty acids 1000 MG capsule Take 1 g by mouth daily.      . furosemide (LASIX) 20 MG tablet  Take 1 tablet (20 mg total) by mouth daily.  30 tablet  0  . glucose blood test strip Use as instructed  100 each  12  . glucose monitoring kit (FREESTYLE) monitoring kit 1 each by Does not apply route as needed for other.  1 each  0  . Lancets (FREESTYLE) lancets Use as instructed  100 each  12  . metFORMIN (GLUCOPHAGE) 500 MG tablet Take 1 tablet (500 mg total) by mouth 2 (two) times daily with a meal.  60 tablet  1  . Multiple Vitamins-Minerals (MULTIVITAMIN) tablet Take 1 tablet by mouth daily.      Marland Kitchen omeprazole (PRILOSEC) 40 MG capsule Take 1 capsule (40 mg total) by mouth daily.  30 capsule  3  . potassium chloride (K-DUR,KLOR-CON) 10 MEQ tablet Take 1 tablet (10 mEq total) by mouth daily.  30 tablet  0   No current facility-administered medications for this visit.     Physical Exam:  BP 133/79  Pulse 61  Resp 16  Ht 5' 6.5" (1.689 m)  Wt 174 lb (78.926 kg)  BMI 27.67 kg/m2  SpO2 97% Cardiovascular: Normal rate, regular rhythm and intact distal pulses.  3/6 harsh systolic murmur heard throughout the precordium with radiation into the neck bilat.  Pulmonary/Chest: Effort normal and breath sounds normal. No respiratory  distress. She has no wheezes. She has no rales.  There is no peripheral edema.  Diagnostic Tests:  Transthoracic Echocardiography  Patient: Jane, Arellano MR #: [id number]   Study Date: 05/02/2013   Gender: F Age: 2 Height: 168.9cm Weight: 84.5kg BSA: 2.54m2 Pt. Status: Room: 2JerauldBerge, Christopher ADMITTING Bensimhon, Daniel ATTENDING Bensimhon, Daniel cc:  ------------------------------------------------------------ LV EF: 60% - 65%  ------------------------------------------------------------ Indications: Previous study 02/18/2010.  ------------------------------------------------------------ History: PMH: Palpitations. Anemia. History of cocaine abuse. Aortic valve  disorder. Subaortic membrane. Atrial fibrillation. Risk factors: Hypertension.  ------------------------------------------------------------ Study Conclusions  - Left ventricle: The cavity size was normal. Wall thickness was increased in a pattern of mild LVH. Systolic function was normal. The estimated ejection fraction was in the range of 60% to 65%. Wall motion was normal; there were no regional wall motion abnormalities. Doppler parameters are consistent with abnormal left ventricular relaxation (grade 1 diastolic dysfunction). - Aortic valve: No valvular aortic stenosis but suspect subaortic stenosis with mean gradient 42 mmHg/peak gradient 81 mmHg in the LVOT. Trivial regurgitation. - Mitral valve: No significant regurgitation. - Left atrium: The atrium was mildly dilated. - Right ventricle: Poorly visualized. The cavity size was normal. Systolic function was normal. - Right atrium: The atrium was mildly dilated. - Pulmonary arteries: No complete TR doppler jet so unable to estimate PA systolic pressure. - Inferior vena cava: The vessel was normal in size; the respirophasic diameter changes were in the normal range (= 50%); findings are consistent with normal central venous pressure. Impressions:  - Normal LV size with mild LV hypertrophy. EF 60-65%. There was no valvular aortic stenosis. There was turbulence in the LV outflow tract with possible subvalvular membrane. There was a significant LVOT gradient mean 42 mmHg/peak 81 mmHg. Would consider TEE to evaluate subvalvular membrane. Transthoracic echocardiography. M-mode, complete 2D, spectral Doppler, and color Doppler. Height: Height: 168.9cm. Height: 66.5in. Weight: Weight: 84.5kg. Weight: 185.9lb. Body mass index: BMI: 29.6kg/m^2. Body surface area: BSA: 2.0582m. Blood pressure: 154/91. Patient status: Inpatient. Location:  ICU/CCU  ------------------------------------------------------------  ------------------------------------------------------------ Left ventricle: The cavity size was normal. Wall thickness was increased in a pattern of mild LVH. Systolic function was normal. The estimated ejection fraction was in the range of 60% to 65%. Wall motion was normal; there were no regional wall motion abnormalities. Doppler parameters are consistent with abnormal left ventricular relaxation (grade 1 diastolic dysfunction).  ------------------------------------------------------------ Aortic valve: Trileaflet; mildly calcified leaflets. No valvular aortic stenosis but suspect subaortic stenosis with mean gradient 42 mmHg/peak gradient 81 mmHg in the LVOT. Doppler: Trivial regurgitation.  ------------------------------------------------------------ Aorta: Aortic root: The aortic root was normal in size. Ascending aorta: The ascending aorta was normal in size.  ------------------------------------------------------------ Mitral valve: Doppler: There was no evidence for stenosis. No significant regurgitation. Peak gradient: 82m482mg (D).  ------------------------------------------------------------ Left atrium: The atrium was mildly dilated.  ------------------------------------------------------------ Right ventricle: Poorly visualized. The cavity size was normal. Systolic function was normal.  ------------------------------------------------------------ Pulmonic valve: Structurally normal valve. Cusp separation was normal. Doppler: Transvalvular velocity was within the normal range. No regurgitation.  ------------------------------------------------------------ Tricuspid valve: Doppler: Trivial regurgitation.  ------------------------------------------------------------ Pulmonary artery: No complete TR doppler jet so unable to estimate PA systolic  pressure.  ------------------------------------------------------------ Right atrium: The atrium was mildly dilated.  ------------------------------------------------------------ Pericardium: There was no pericardial effusion.  ------------------------------------------------------------ Systemic veins: Inferior vena cava: The vessel was normal in size; the respirophasic diameter changes were in the normal range (= 50%); findings are consistent with  normal central venous pressure.  ------------------------------------------------------------  2D measurements Normal Doppler Normal Left ventricle measurements LVID ED, 43.3 mm 43-52 Left ventricle chord, Ea, lat 10.9 cm/ ------- PLAX ann, tiss s LVID ES, 22.8 mm 23-38 DP chord, E/Ea, lat 7.42 ------- PLAX ann, tiss FS, chord, 47 % >29 DP PLAX Ea, med 5.92 cm/ ------- LVPW, ED 13.1 mm ------ ann, tiss s IVS/LVPW 0.88 <1.3 DP ratio, ED E/Ea, med 13.67 ------- Ventricular septum ann, tiss IVS, ED 11.5 mm ------ DP LVOT Mitral valve Diam, S 15 mm ------ Peak E vel 80.9 cm/ ------- Area 1.77 cm^2 ------ s Aorta Peak A vel 87.8 cm/ ------- Root diam, 31 mm ------ s ED Deceleratio 317 ms 150-230 Left atrium n time AP dim 37 mm ------ Peak 3 mm ------- AP dim 1.84 cm/m^2 <2.2 gradient, D Hg index Peak E/A 0.9 ------- ratio Right ventricle Sa vel, lat 15.5 cm/ ------- ann, tiss s DP  ------------------------------------------------------------ Prepared and Electronically Authenticated by  Loralie Champagne 2014-06-12T10:32:06.920    Josue Hector, MD Fri Nov 08, 2013 5:49:30 PM EST       ADDENDUM REPORT: 11/08/2013 17:47  CLINICAL DATA: Pre Op Aortic Valve Surgery  EXAM:  Cardiac CTA  MEDICATIONS:  Sub lingual nitro. 38m and lopressor 552m TECHNIQUE:  The patient was scanned on a [employer] [id number] scanner. Gantry  rotation speed was 270 msecs. Collimation was .32m21mA 120 kV  retrospective scan was triggered in  the descending thoracic aorta at  111 HU's with reconstructions every 10% of the R-R interval. Average  HR during the scan was 64 bpm. The 3D data set was interpreted on a  dedicated work station using MPR, MIP and VRT modes. A total of 80cc  of contrast was used.  FINDINGS:  [facility]-cardiac: [facility]. No  significant findings on limited lung and soft tissue windows.  Calcium Score: 0  Coronary Arteries: Right dominant with no anomalies  LM: Normal  LAD: Normal  No diagonal branches visualized  a  Circumflex: Normal  OM1: Normal  OM2: Normal  RCA: Dominant and normal  PDA: Normal  PLA: Normal  Aortic Anatomy: The aortic valve was trileaflet. Valve area in  systole was over 2.5cm2 indicating no valvular stenosis. The was a  thin membrane about 1-1.5 cm below the valve in the LVOT. This  correlates with the area of flow acceleration and stenosis on TTE.  There is no subaortic tunnel and no systolic anterior motion of the  anterior mitral leaflet  Aorta: Mildly dilated ascending aortic root. Normal Arch No  coarctation Normal descending thoracic aorta  Sinus of Valsalva: 2.9 cm  Sinotubular Junction: 2.5 cm  Ascending Root: 3.9 cm  Arch: 2.3 cm  Descending Thoracic Aorta: 1.8 cm  Aortic Annulus 348m68mIMPRESSION:  1) Fairly thin appearing sub aortic membrane approximately 1.5 cm  below aortic valve No tunnel or thick shelf like structures seen  2) Normal trileaflet aortic valve with no stenosis.  3) Normal right dominant coronary arteries although distal  circumflex and diagonal branches not well seen  4) Calcium Score 0  5) Very mild ascending aortic root dilatation 3.9 cm  Overall the anatomy would seem to be suitable for a trans-aortic  valve sparing procedure to resect the membrane  PeteJenkins Rougeectronically Signed  By: PeteJenkins Rouge.  On: 11/08/2013 17:47       Study Result    EXAM:  OVER-READ INTERPRETATION CT CHEST  The following report is an over-read performed by radiologist Dr.  Collene Leyden [facility] on 11/08/2013. This over-read  does not include interpretation of cardiac or coronary anatomy or  pathology. The Cardiac CTA interpretation by the cardiologist is  attached.  COMPARISON: None.  FINDINGS:  Right lung is clear. There is a platelike area of density noted  posteriorly in the left lower lobe on image 21 of series 4. This  could reflect atelectasis or noncalcified pleural plaque. This  measures 16 mm in greatest dimension. Left lung otherwise clear. No  pleural effusions.  Heart is normal size. Visualized aorta is normal caliber with a  maximum ascending aortic dimension 3.9 cm. Incidentally noted is a  retroesophageal right subclavian artery. Soft tissue in the anterior  mediastinum felt represent residual thymus. No visible adenopathy.  Imaging into the upper abdomen shows no acute findings. Chest wall  soft tissues are unremarkable. No acute bony abnormality.  IMPRESSION:  Platelike area along the posterior pleura in the left lower lobe.  This could represent a noncalcified pleural plaque or area of  atelectasis. This could be followed with repeat CT in 6-12 months to  ensure stability or resolution.  Ascending aorta upper limits normal in size at 3.9 cm.  Small pericardial effusion.  Retroesophageal right subclavian artery.  Electronically Signed:  By: Rolm Baptise M.D.  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 She denies any significant symptoms. I think this should be resected to prevent progressive LV hypertrophy and dysfunction. Her aortic valve is fine. Her cardiac CT shows no significant cornary stenoses so a cardiac cath is not needed. She has had a couple episodes of atrial fibrillation, most recently in December requiring DCCV. I told her that I don't know if  this is related to her subaortic stenosis. I discussed the surgical procedure with her  and her family including alternatives, benefits and risks; including but not limited to bleeding, blood transfusion, infection, stroke, myocardial infarction, injury to the aortic valve, heart block requiring a permanent pacemaker, organ dysfunction, and death.  Jane Arellano understands and agrees to proceed.    Plan:  She will call us to schedule resection of the subaortic membrane. She thinks she would like to wait until March.

## 2013-12-03 ENCOUNTER — Ambulatory Visit: Payer: Self-pay | Admitting: Internal Medicine

## 2013-12-05 ENCOUNTER — Other Ambulatory Visit: Payer: Self-pay | Admitting: Internal Medicine

## 2013-12-06 ENCOUNTER — Other Ambulatory Visit: Payer: Self-pay | Admitting: Internal Medicine

## 2013-12-25 ENCOUNTER — Encounter: Payer: Self-pay | Admitting: Internal Medicine

## 2013-12-25 ENCOUNTER — Ambulatory Visit: Payer: No Typology Code available for payment source | Attending: Internal Medicine | Admitting: Internal Medicine

## 2013-12-25 ENCOUNTER — Other Ambulatory Visit: Payer: Self-pay | Admitting: Internal Medicine

## 2013-12-25 VITALS — BP 156/88 | HR 61 | Temp 98.7°F | Resp 14 | Ht 66.0 in | Wt 178.4 lb

## 2013-12-25 DIAGNOSIS — I4891 Unspecified atrial fibrillation: Secondary | ICD-10-CM | POA: Insufficient documentation

## 2013-12-25 DIAGNOSIS — M542 Cervicalgia: Secondary | ICD-10-CM

## 2013-12-25 DIAGNOSIS — I10 Essential (primary) hypertension: Secondary | ICD-10-CM | POA: Insufficient documentation

## 2013-12-25 DIAGNOSIS — Z0271 Encounter for disability determination: Secondary | ICD-10-CM

## 2013-12-25 MED ORDER — DICLOFENAC SODIUM 1 % TD GEL: 2.0000 g | Freq: Four times a day (QID) | TRANSDERMAL | Status: DC

## 2013-12-25 NOTE — Progress Notes (Signed)
Patient ID: Jane Arellano, female   DOB: January 24, 1961, 53 y.o.   MRN: [id number]   CC:  HPI: 53 year-old female with a history of subaortic membrane, recently seen by cardiology and cardiothoracic surgery,, developed new onset atrial fibrillation in 2014. She was treated with rate control and anticoagulation and converted to sinus. She was intially on Xarelto but developed some bleeding from gums and was switched to Pradaxa. Repeat 2D echo 05/02/2013 showed an increase in the subaortic gradient to a mean of 42 and a peak of 81 with mild LVH and normal systolic function with an EF of 60-65%. She recently presented with recurrent atrial fibrillation and was cardioverted. Patient was told by the cardiothoracic surgery that without resection the patient developed progressive left ventricle hypertrophy and dysfunction   Today she denies any cardiopulmonary symptoms, she is complaining off cervical spine pain because of her neck below that the patient uses at home. This has been going on for a few days Otherwise hemodynamically stable    Allergies  Allergen Reactions  . Ketorolac Tromethamine Other (See Comments)    Unknown reaction   Past Medical History  Diagnosis Date  . Atypical chest pain     a. 11/2005 Negative Myoview  . Subaortic membrane     a. 01/2010 Echo: EF 55-60%, No rwma, subaortic membrane with elevated LVOT mean gradient of 21 mmHg, Triv AI, mod dil LA, mildly increased PASP. b. 05/02/2013 Echo:  LVEF 59-56%, grade 1 diastolic dysfunction, mild LVH, subaortic stenosis w/ turbulation in LVOT c/w subaortic membrane (mean gradient 42 mmHg/peak gradient 81 mmHg), mild biatrial enlargement  . GERD (gastroesophageal reflux disease)   . HTN (hypertension)   . Iron deficiency anemia   . History of substance abuse   . Paroxysmal atrial fibrillation 05/01/2013    On Xarelto  . Diastolic dysfunction   . Heart murmur   . DM2 (diabetes mellitus, type 2)    Current Outpatient Prescriptions  on File Prior to Visit  Medication Sig Dispense Refill  . amLODipine (NORVASC) 10 MG tablet Take 1 tablet (10 mg total) by mouth daily.  30 tablet  2  . antiseptic oral rinse (BIOTENE) LIQD 15 mLs by Mouth Rinse route daily.      . Calcium Carb-Cholecalciferol (CALCIUM-VITAMIN D3) 500-400 MG-UNIT TABS Take 1 tablet by mouth daily.      . carvedilol (COREG) 6.25 MG tablet Take 1 tablet (6.25 mg total) by mouth 2 (two) times daily with a meal.  60 tablet  1  . dabigatran (PRADAXA) 150 MG CAPS capsule Take 1 capsule (150 mg total) by mouth every 12 (twelve) hours.  180 capsule  3  . ferrous sulfate 325 (65 FE) MG tablet Take 1 tablet (325 mg total) by mouth 3 (three) times daily with meals.      . fish oil-omega-3 fatty acids 1000 MG capsule Take 1 g by mouth daily.      . furosemide (LASIX) 20 MG tablet Take 1 tablet (20 mg total) by mouth daily.  30 tablet  0  . glucose blood test strip Use as instructed  100 each  12  . glucose monitoring kit (FREESTYLE) monitoring kit 1 each by Does not apply route as needed for other.  1 each  0  . K-TAB 10 MEQ tablet TAKE 1 TABLET BY MOUTH ONCE DAILY.  30 tablet  0  . K-TAB 10 MEQ tablet TAKE 1 TABLET BY MOUTH ONCE DAILY.  30 tablet  0  . Lancets (  FREESTYLE) lancets Use as instructed  100 each  12  . metFORMIN (GLUCOPHAGE) 500 MG tablet Take 1 tablet (500 mg total) by mouth 2 (two) times daily with a meal.  60 tablet  1  . Multiple Vitamins-Minerals (MULTIVITAMIN) tablet Take 1 tablet by mouth daily.      Marland Kitchen omeprazole (PRILOSEC) 40 MG capsule Take 1 capsule (40 mg total) by mouth daily.  30 capsule  3  . furosemide (LASIX) 20 MG tablet TAKE 1 TABLET BY MOUTH DAILY.  30 tablet  0  . [DISCONTINUED] potassium chloride (K-DUR,KLOR-CON) 10 MEQ tablet Take 1 tablet (10 mEq total) by mouth daily.  30 tablet  0   No current facility-administered medications on file prior to visit.   Family History  Problem Relation Age of Onset  . Hypertension Sister     alive  and well  . Diabetes Father     deceased  . Cancer Mother     deceased @ 23  . Stroke Mother   . Hypertension Sister     alive and well  . Heart attack Brother     deceased @ 23   History   Social History  . Marital Status: Single    Spouse Name: N/A    Number of Children: N/A  . Years of Education: N/A   Occupational History  . Not on file.   Social History Main Topics  . Smoking status: Never Smoker   . Smokeless tobacco: Current User    Types: Snuff  . Alcohol Use: Yes     Comment: Drinks 3 - 40 oz beers daily.  i QUIT DOING THAT "  . Drug Use: Yes    Special: Marijuana     Comment: Smokes marijuana weekly.  Has not used cocaine in 9 years.  . Sexual Activity: Not on file   Other Topics Concern  . Not on file   Social History Narrative   Lives in [place] by herself.  She had been caring for her mother but she died 2 mos ago.  She tries to remain active but does not regularly exercise.    Review of Systems  Constitutional: As in history of present illness  HENT: Negative for ear pain, nosebleeds, congestion, facial swelling, rhinorrhea, neck pain, neck stiffness and ear discharge.   Eyes: Negative for pain, discharge, redness, itching and visual disturbance.  Respiratory: Negative for cough, choking, chest tightness, shortness of breath, wheezing and stridor.   Cardiovascular: Negative for chest pain, palpitations and leg swelling.  Gastrointestinal: Negative for abdominal distention.  Genitourinary: Negative for dysuria, urgency, frequency, hematuria, flank pain, decreased urine volume, difficulty urinating and dyspareunia.  Musculoskeletal: Negative for back pain, joint swelling, arthralgias and gait problem.  Neurological: Negative for dizziness, tremors, seizures, syncope, facial asymmetry, speech difficulty, weakness, light-headedness, numbness and headaches.  Hematological: Negative for adenopathy. Does not bruise/bleed easily.  Psychiatric/Behavioral: Negative  for hallucinations, behavioral problems, confusion, dysphoric mood, decreased concentration and agitation.    Objective:   Filed Vitals:   12/25/13 1048  BP: 156/88  Pulse: 61  Temp: 98.7 F (37.1 C)  Resp: 14    Physical Exam  Constitutional: Appears well-developed and well-nourished. No distress.  HENT: Normocephalic. External right and left ear normal. Oropharynx is clear and moist.  Eyes: Conjunctivae and EOM are normal. PERRLA, no scleral icterus.  Neck: Normal ROM. Neck supple. No JVD. No tracheal deviation. No thyromegaly.  CVS: RRR, S1/S2 +, no murmurs, no gallops, no carotid bruit.  Pulmonary:  Effort and breath sounds normal, no stridor, rhonchi, wheezes, rales.  Abdominal: Soft. BS +,  no distension, tenderness, rebound or guarding.  Musculoskeletal: Normal range of motion. No edema and no tenderness.  Lymphadenopathy: No lymphadenopathy noted, cervical, inguinal. Neuro: Alert. Normal reflexes, muscle tone coordination. No cranial nerve deficit. Skin: Skin is warm and dry. No rash noted. Not diaphoretic. No erythema. No pallor.  Psychiatric: Normal mood and affect. Behavior, judgment, thought content normal.   Lab Results  Component Value Date   WBC 5.0 10/30/2013   HGB 13.1 10/30/2013   HCT 39.9 10/30/2013   MCV 93.8 10/30/2013   PLT 225.0 10/30/2013   Lab Results  Component Value Date   CREATININE 0.7 10/30/2013   BUN 11 10/30/2013   NA 144 10/30/2013   K 4.0 10/30/2013   CL 106 10/30/2013   CO2 28 10/30/2013    Lab Results  Component Value Date   HGBA1C 5.8 10/03/2013   Lipid Panel     Component Value Date/Time   CHOL 157 05/02/2013 0535   TRIG 131 05/02/2013 0535   HDL 67 05/02/2013 0535   CHOLHDL 2.3 05/02/2013 0535   VLDL 26 05/02/2013 0535   LDLCALC 64 05/02/2013 0535       Assessment and plan:   Patient Active Problem List   Diagnosis Date Noted  . PAF (paroxysmal atrial fibrillation) 05/08/2013  . 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 Hyperglycemia 05/02/2013  . ETOH abuse 05/02/2013  . Marijuana abuse 05/02/2013  . Diastolic dysfunction 41/28/7867  . Depression with anxiety 05/01/2013  . Subaortic membrane   . HTN (hypertension)   . 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 GERD 04/30/2007       Neck pain Likely secondary to cervical degenerative arthritis Obtain plain films of the neck Patient has been prescribed diclofenac gel to be applied locally Tylenol over-the-counter as indicated above because of the patient would need an MRI   Hypertension/atrial fibrillation Currently stable on pradaxa , Coreg, Norvasc    subaortic membrane Seen with Dr. Cyndia Bent, patient is to schedule her surgery  Otherwise follow up in 3 months   The patient was given clear instructions to go to ER or return to medical center if symptoms don't improve, worsen or new problems develop. The patient verbalized understanding. The patient was told to call to get any lab results if not heard anything in the next week.

## 2013-12-25 NOTE — Progress Notes (Signed)
Pt is here for a f/u for GERD. Complains of Lt aching arm pain. No real pain today. Pain occurs mainly while sleeping and spreads to the neck.

## 2014-01-08 ENCOUNTER — Other Ambulatory Visit: Payer: Self-pay | Admitting: Internal Medicine

## 2014-01-23 ENCOUNTER — Other Ambulatory Visit: Payer: Self-pay | Admitting: *Deleted

## 2014-01-23 ENCOUNTER — Other Ambulatory Visit: Payer: Self-pay | Admitting: Internal Medicine

## 2014-01-23 DIAGNOSIS — Q244 Congenital subaortic stenosis: Secondary | ICD-10-CM

## 2014-02-03 ENCOUNTER — Other Ambulatory Visit: Payer: Self-pay | Admitting: *Deleted

## 2014-02-03 DIAGNOSIS — Q244 Congenital subaortic stenosis: Secondary | ICD-10-CM

## 2014-02-05 ENCOUNTER — Other Ambulatory Visit: Payer: Self-pay | Admitting: *Deleted

## 2014-02-17 ENCOUNTER — Other Ambulatory Visit: Payer: Self-pay | Admitting: *Deleted

## 2014-02-17 DIAGNOSIS — Q244 Congenital subaortic stenosis: Secondary | ICD-10-CM

## 2014-02-19 ENCOUNTER — Ambulatory Visit (INDEPENDENT_AMBULATORY_CARE_PROVIDER_SITE_OTHER): Payer: No Typology Code available for payment source | Admitting: Surgery

## 2014-02-19 ENCOUNTER — Encounter: Payer: Self-pay | Admitting: Surgery

## 2014-02-19 VITALS — BP 142/86 | HR 69 | Resp 20 | Ht 66.0 in | Wt 178.0 lb

## 2014-02-19 DIAGNOSIS — Q244 Congenital subaortic stenosis: Secondary | ICD-10-CM

## 2014-02-19 NOTE — Progress Notes (Signed)
   HPI:  The patient is a 53 year old woman who presented in 2007 with atypical chest pain and ruled out for MI. She had a negative Myoview. She was noted to have a murmur and 2D echo followed by TEE showed what appeared to be a subaortic membrane with a velocity across this membrane of 4 m/s. The valve looked trileaflet with no stenosis and trivial regurgitation. She had a follow up echo in 01/2010 which showed a mean gradient across the LVOT of 21 mm Hg. She did not return for followup until she returned with rapid atrial fibrillation.She was treated with rate control and anticoagulation and converted to sinus. She was intially on Xarelto but developed some bleeding from gums and was switched to Pradaxa. Repeat 2D echo 05/02/2013 showed an increase in the subaortic gradient to a mean of 42 and a peak of 81 with mild LVH and normal systolic function with an EF of 60-65%. There was trivial AI and no MR. She says she is asymptomatic although she will also admit to occasional episodes of exertional dyspnea. She recently presented with recurrent atrial fibrillation and was cardioverted. She returns today to review surgical plans for later this month. She says she continues to do well and denies shortness of breath, chest discomfort, dizziness, and peripheral edema.   Current Outpatient Prescriptions  Medication Sig Dispense Refill  . amLODipine (NORVASC) 10 MG tablet TAKE 1 TABLET BY MOUTH DAILY.  30 tablet  2  . antiseptic oral rinse (BIOTENE) LIQD 15 mLs by Mouth Rinse route daily.      . Calcium Carb-Cholecalciferol (CALCIUM-VITAMIN D3) 500-400 MG-UNIT TABS Take 1 tablet by mouth daily.      . carvedilol (COREG) 6.25 MG tablet TAKE 1 TABLET BY MOUTH 2 TIMES DAILY WITH A MEAL.  60 tablet  1  . dabigatran (PRADAXA) 150 MG CAPS capsule Take 1 capsule (150 mg total) by mouth every 12 (twelve) hours.  180 capsule  3  . ferrous sulfate 325 (65 FE) MG tablet Take 1 tablet (325 mg total) by mouth 3 (three)  times daily with meals.      . fish oil-omega-3 fatty acids 1000 MG capsule Take 1 g by mouth daily.      . furosemide (LASIX) 20 MG tablet TAKE 1 TABLET BY MOUTH DAILY.  30 tablet  0  . glucose blood test strip Use as instructed  100 each  12  . glucose monitoring kit (FREESTYLE) monitoring kit 1 each by Does not apply route as needed for other.  1 each  0  . K-TAB 10 MEQ tablet TAKE 1 TABLET BY MOUTH ONCE DAILY.  30 tablet  0  . K-TAB 10 MEQ tablet TAKE 1 TABLET BY MOUTH ONCE DAILY.  30 tablet  0  . Lancets (FREESTYLE) lancets Use as instructed  100 each  12  . metFORMIN (GLUCOPHAGE) 500 MG tablet TAKE 1 TABLET BY MOUTH 2 TIMES DAILY WITH A MEAL.  60 tablet  1  . Multiple Vitamins-Minerals (MULTIVITAMIN) tablet Take 1 tablet by mouth daily.      . omeprazole (PRILOSEC) 40 MG capsule TAKE 1 CAPSULE BY MOUTH ONCE DAILY.  30 capsule  3  . [DISCONTINUED] potassium chloride (K-DUR,KLOR-CON) 10 MEQ tablet Take 1 tablet (10 mEq total) by mouth daily.  30 tablet  0   No current facility-administered medications for this visit.     Physical Exam: BP 142/86  Pulse 69  Resp 20  Ht 5' 6" (1.676   m)  Wt 178 lb (80.74 kg)  BMI 28.74 kg/m2  SpO2 98% Cardiovascular: Normal rate, regular rhythm. 3/6 harsh systolic murmur heard throughout the precordium with radiation into the neck bilat.  Pulmonary/Chest: Effort normal and breath sounds normal. No respiratory distress. She has no wheezes. She has no rales.  There is no peripheral edema.   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 She denies any significant symptoms. I think this should be resected to prevent progressive LV hypertrophy and dysfunction. Her aortic valve is fine. Her cardiac CT shows no significant cornary stenoses so a cardiac cath is not needed. She has had a couple episodes of atrial fibrillation, most recently in December requiring DCCV. I told her that I don't  know if this is related to her subaortic stenosis. I discussed the surgical procedure with her and her family including alternatives, benefits and risks; including but not limited to bleeding, blood transfusion, infection, stroke, myocardial infarction, injury to the aortic valve, heart block requiring a permanent pacemaker, organ dysfunction, and death. Secilia Y Varble understands and agrees to proceed.    Plan:  Resection of subaortic membrane on 03/18/2014.      

## 2014-02-27 ENCOUNTER — Encounter (HOSPITAL_COMMUNITY): Payer: No Typology Code available for payment source

## 2014-02-27 ENCOUNTER — Other Ambulatory Visit (HOSPITAL_COMMUNITY): Payer: No Typology Code available for payment source

## 2014-03-04 ENCOUNTER — Other Ambulatory Visit: Payer: Self-pay | Admitting: Internal Medicine

## 2014-03-10 ENCOUNTER — Encounter (HOSPITAL_COMMUNITY): Payer: Self-pay | Admitting: Pharmacy Technician

## 2014-03-14 ENCOUNTER — Ambulatory Visit (HOSPITAL_COMMUNITY)
Admission: RE | Admit: 2014-03-14 | Discharge: 2014-03-14 | Disposition: A | Discharge status: Home / Self Care | Payer: No Typology Code available for payment source | Source: Ambulatory Visit | Attending: Surgery | Admitting: Surgery

## 2014-03-14 ENCOUNTER — Encounter (HOSPITAL_COMMUNITY): Payer: Self-pay

## 2014-03-14 ENCOUNTER — Encounter (HOSPITAL_COMMUNITY)
Admission: RE | Admit: 2014-03-14 | Discharge: 2014-03-14 | Disposition: A | Discharge status: Home / Self Care | Payer: No Typology Code available for payment source | Source: Ambulatory Visit | Attending: Surgery | Admitting: Surgery

## 2014-03-14 VITALS — BP 139/84 | HR 61 | Temp 97.6°F | Resp 20 | Ht 66.5 in | Wt 181.2 lb

## 2014-03-14 DIAGNOSIS — I6529 Occlusion and stenosis of unspecified carotid artery: Secondary | ICD-10-CM | POA: Insufficient documentation

## 2014-03-14 DIAGNOSIS — I519 Heart disease, unspecified: Secondary | ICD-10-CM | POA: Insufficient documentation

## 2014-03-14 DIAGNOSIS — Q244 Congenital subaortic stenosis: Secondary | ICD-10-CM

## 2014-03-14 DIAGNOSIS — Z01812 Encounter for preprocedural laboratory examination: Secondary | ICD-10-CM | POA: Insufficient documentation

## 2014-03-14 DIAGNOSIS — Z01810 Encounter for preprocedural cardiovascular examination: Secondary | ICD-10-CM | POA: Insufficient documentation

## 2014-03-14 DIAGNOSIS — I5189 Other ill-defined heart diseases: Secondary | ICD-10-CM

## 2014-03-14 DIAGNOSIS — R52 Pain, unspecified: Secondary | ICD-10-CM | POA: Insufficient documentation

## 2014-03-14 DIAGNOSIS — I10 Essential (primary) hypertension: Secondary | ICD-10-CM | POA: Insufficient documentation

## 2014-03-14 LAB — COMPREHENSIVE METABOLIC PANEL
ALK PHOS: 83 U/L (ref 39–117)
ALT: 22 U/L (ref 0–35)
AST: 19 U/L (ref 0–37)
Albumin: 4.1 g/dL (ref 3.5–5.2)
BUN: 13 mg/dL (ref 6–23)
CHLORIDE: 102 meq/L (ref 96–112)
CO2: 21 meq/L (ref 19–32)
Calcium: 10.1 mg/dL (ref 8.4–10.5)
Creatinine, Ser: 0.56 mg/dL (ref 0.50–1.10)
GLUCOSE: 138 mg/dL — AB (ref 70–99)
POTASSIUM: 3.8 meq/L (ref 3.7–5.3)
Sodium: 140 mEq/L (ref 137–147)
Total Bilirubin: 0.3 mg/dL (ref 0.3–1.2)
Total Protein: 8.7 g/dL — ABNORMAL HIGH (ref 6.0–8.3)

## 2014-03-14 LAB — SURGICAL PCR SCREEN
MRSA, PCR: NEGATIVE
Staphylococcus aureus: NEGATIVE

## 2014-03-14 LAB — CBC
HCT: 38.2 % (ref 36.0–46.0)
HEMOGLOBIN: 12.7 g/dL (ref 12.0–15.0)
MCH: 32.4 pg (ref 26.0–34.0)
MCHC: 33.2 g/dL (ref 30.0–36.0)
MCV: 97.4 fL (ref 78.0–100.0)
PLATELETS: 200 10*3/uL (ref 150–400)
RBC: 3.92 MIL/uL (ref 3.87–5.11)
RDW: 12.5 % (ref 11.5–15.5)
WBC: 6.2 10*3/uL (ref 4.0–10.5)

## 2014-03-14 LAB — HEMOGLOBIN A1C
Hgb A1c MFr Bld: 6.4 % — ABNORMAL HIGH (ref <5.7)
Mean Plasma Glucose: 137 mg/dL — ABNORMAL HIGH (ref <117)

## 2014-03-14 LAB — URINALYSIS, ROUTINE W REFLEX MICROSCOPIC
Bilirubin Urine: NEGATIVE
GLUCOSE, UA: NEGATIVE mg/dL
HGB URINE DIPSTICK: NEGATIVE
KETONES UR: NEGATIVE mg/dL
LEUKOCYTES UA: NEGATIVE
Nitrite: NEGATIVE
Protein, ur: NEGATIVE mg/dL
Specific Gravity, Urine: 1.017 (ref 1.005–1.030)
Urobilinogen, UA: 0.2 mg/dL (ref 0.0–1.0)
pH: 5 (ref 5.0–8.0)

## 2014-03-14 LAB — BLOOD GAS, ARTERIAL
Acid-Base Excess: 0.7 mmol/L (ref 0.0–2.0)
BICARBONATE: 24.6 meq/L — AB (ref 20.0–24.0)
DRAWN BY: 206361
O2 SAT: 98 %
PCO2 ART: 38.5 mmHg (ref 35.0–45.0)
PH ART: 7.422 (ref 7.350–7.450)
PO2 ART: 103 mmHg — AB (ref 80.0–100.0)
Patient temperature: 98.6
TCO2: 25.8 mmol/L (ref 0–100)

## 2014-03-14 LAB — ABO/RH: ABO/RH(D): A POS

## 2014-03-14 LAB — PROTIME-INR
INR: 0.93 (ref 0.00–1.49)
Prothrombin Time: 12.3 seconds (ref 11.6–15.2)

## 2014-03-14 LAB — APTT: aPTT: 29 seconds (ref 24–37)

## 2014-03-14 MED ORDER — ALBUTEROL SULFATE (2.5 MG/3ML) 0.083% IN NEBU
2.5000 mg | INHALATION_SOLUTION | Freq: Once | RESPIRATORY_TRACT | Status: AC
  Administered 2014-03-14: 2.5 mg via RESPIRATORY_TRACT

## 2014-03-14 NOTE — Pre-Procedure Instructions (Signed)
Jane LevyWindy Y Arellano  03/14/2014   Your procedure is scheduled on:  Tuesday  03/18/14    [facility] Admitting at 530 AM.  Call this number if you have problems the morning of surgery: [phone]   Remember:   Do not eat food or drink liquids after midnight.   Take these medicines the morning of surgery with A SIP OF WATER:  TYLENOL IF NEEDED, AMLOSIPINE(NORVASC), CARVEDILOL(COREG), OMEPRAZOLE, POTASSIUM    Do not wear jewelry, make-up or nail polish.  Do not wear lotions, powders, or perfumes. You may wear deodorant.  Do not shave 48 hours prior to surgery. Men may shave face and neck.  Do not bring valuables to the hospital.  [facility] is not responsible                  for any belongings or valuables.               Contacts, dentures or bridgework may not be worn into surgery.  Leave suitcase in the car. After surgery it may be brought to your room.  For patients admitted to the hospital, discharge time is determined by your                treatment team.               Patients discharged the day of surgery will not be allowed to drive  home.  Name and phone number of your driver:   Special Instructions:  Special Instructions: [place] - Preparing for Surgery  Before surgery, you can play an important role.  Because skin is not sterile, your skin needs to be as free of germs as possible.  You can reduce the number of germs on you skin by washing with CHG (chlorahexidine gluconate) soap before surgery.  CHG is an antiseptic cleaner which kills germs and bonds with the skin to continue killing germs even after washing.  Please DO NOT use if you have an allergy to CHG or antibacterial soaps.  If your skin becomes reddened/irritated stop using the CHG and inform your nurse when you arrive at Short Stay.  Do not shave (including legs and underarms) for at least 48 hours prior to the first CHG shower.  You may shave your face.  Please follow these instructions  carefully:   1.  Shower with CHG Soap the night before surgery and the morning of Surgery.  2.  If you choose to wash your hair, wash your hair first as usual with your normal shampoo.  3.  After you shampoo, rinse your hair and body thoroughly to remove the Shampoo.  4.  Use CHG as you would any other liquid soap. You can apply chg directly to the skin and wash gently with scrungie or a clean washcloth.  5.  Apply the CHG Soap to your body ONLY FROM THE NECK DOWN.  Do not use on open wounds or open sores.  Avoid contact with your eyes, ears, mouth and genitals (private parts).  Wash genitals (private parts with your normal soap.  6.  Wash thoroughly, paying special attention to the area where your surgery will be performed.  7.  Thoroughly rinse your body with warm water from the neck down.  8.  DO NOT shower/wash with your normal soap after using and rinsing off the CHG Soap.  9.  Pat yourself dry with a clean towel.  10.  Wear clean pajamas.            11.  Place clean sheets on your bed the night of your first shower and do not sleep with pets.  Day of Surgery  Do not apply any lotions/deodorants the morning of surgery.  Please wear clean clothes to the hospital/surgery center.   Please read over the following fact sheets that you were given: Pain Booklet, Coughing and Deep Breathing, Blood Transfusion Information, Open Heart Packet, Total Joint Packet, MRSA Information and Surgical Site Infection Prevention

## 2014-03-14 NOTE — Progress Notes (Signed)
VASCULAR LAB PRELIMINARY  PRELIMINARY  PRELIMINARY  PRELIMINARY  Pre-op Cardiac Surgery  Carotid Findings:  Bilateral:  1-39% ICA stenosis.  Vertebral artery flow is antegrade.     Upper Extremity Right Left  Brachial Pressures 149 triphasic 149 triphasic  Radial Waveforms triphasic triphasic  Ulnar Waveforms biphasic biphasic  Palmar Arch (Allen's Test) WNL WNL   Findings:  Doppler waveforms remain normal with ulnar and radial compressions bilaterally.    Smiley HousemanSandra E Toussaint Golson, RVT 03/14/2014, 10:41 AM

## 2014-03-17 MED ORDER — SODIUM CHLORIDE 0.9 % IV SOLN
INTRAVENOUS | Status: AC
  Administered 2014-03-18: 69.8 mL/h via INTRAVENOUS
  Filled 2014-03-17: qty 40

## 2014-03-17 MED ORDER — PLASMA-LYTE 148 IV SOLN
INTRAVENOUS | Status: AC
  Administered 2014-03-18: 08:00:00
  Filled 2014-03-17: qty 2.5

## 2014-03-17 MED ORDER — MAGNESIUM SULFATE 50 % IJ SOLN
40.0000 meq | INTRAMUSCULAR | Status: DC
  Filled 2014-03-17: qty 10

## 2014-03-17 MED ORDER — DEXMEDETOMIDINE HCL IN NACL 400 MCG/100ML IV SOLN
0.1000 ug/kg/h | INTRAVENOUS | Status: AC
  Administered 2014-03-18: 0.3 ug/kg/h via INTRAVENOUS
  Filled 2014-03-17: qty 100

## 2014-03-17 MED ORDER — METOPROLOL TARTRATE 12.5 MG HALF TABLET: 12.5000 mg | ORAL_TABLET | Freq: Once | ORAL | Status: DC

## 2014-03-17 MED ORDER — SODIUM CHLORIDE 0.9 % IV SOLN
INTRAVENOUS | Status: AC
  Administered 2014-03-18: 1 [IU]/h via INTRAVENOUS
  Filled 2014-03-17: qty 1

## 2014-03-17 MED ORDER — POTASSIUM CHLORIDE 2 MEQ/ML IV SOLN
80.0000 meq | INTRAVENOUS | Status: DC
  Filled 2014-03-17: qty 40

## 2014-03-17 MED ORDER — SODIUM CHLORIDE 0.9 % IV SOLN
INTRAVENOUS | Status: DC
  Filled 2014-03-17: qty 30

## 2014-03-17 MED ORDER — CEFUROXIME SODIUM 750 MG IJ SOLR
750.0000 mg | INTRAMUSCULAR | Status: DC
  Filled 2014-03-17: qty 750

## 2014-03-17 MED ORDER — DEXTROSE 5 % IV SOLN
1.5000 g | INTRAVENOUS | Status: AC
  Administered 2014-03-18: 1.5 g via INTRAVENOUS
  Administered 2014-03-18: .75 g via INTRAVENOUS
  Filled 2014-03-17: qty 1.5

## 2014-03-17 MED ORDER — PHENYLEPHRINE HCL 10 MG/ML IJ SOLN
30.0000 ug/min | INTRAVENOUS | Status: DC
  Filled 2014-03-17: qty 2

## 2014-03-17 MED ORDER — VANCOMYCIN HCL 10 G IV SOLR
1250.0000 mg | INTRAVENOUS | Status: AC
  Administered 2014-03-18: 1250 mg via INTRAVENOUS
  Filled 2014-03-17: qty 1250

## 2014-03-17 MED ORDER — DOPAMINE-DEXTROSE 3.2-5 MG/ML-% IV SOLN
2.0000 ug/kg/min | INTRAVENOUS | Status: DC
  Filled 2014-03-17: qty 250

## 2014-03-17 MED ORDER — NITROGLYCERIN IN D5W 200-5 MCG/ML-% IV SOLN
2.0000 ug/min | INTRAVENOUS | Status: DC
  Filled 2014-03-17: qty 250

## 2014-03-17 MED ORDER — DEXTROSE 5 % IV SOLN
0.5000 ug/min | INTRAVENOUS | Status: DC
  Filled 2014-03-17: qty 4

## 2014-03-17 NOTE — H&P (Signed)
[address]       ,[place] [address]             [phone]      Cardiothoracic Surgery Admission History and Physical   PCP is Jane Brakeman, MD  Referring Provider is Bensimhon, Shaune Pascal, MD  Chief Complaint   Patient presents with   .  Palpitations     referred for subaortic membrane...ECHO 05/02/13   HPI:  The patient is a 53 year old woman who presented in 2007 with atypical chest pain and ruled out for MI. She had a negative Myoview. She was noted to have a murmur and 2D echo followed by TEE showed what appeared to be a subaortic membrane with a velocity across this membrane of 4 m/s. The valve looked trileaflet with no stenosis and trivial regurgitation. She had a follow up echo in 01/2010 which showed a mean gradient across the LVOT of 21 mm Hg. She did not return for followup until she returned with rapid atrial fibrillation.She was treated with rate control and anticoagulation and converted to sinus. She was intially on Xarelto but developed some bleeding from gums and was switched to Pradaxa. Repeat 2D echo 05/02/2013 showed an increase in the subaortic gradient to a mean of 42 and a peak of 81 with mild LVH and normal systolic function with an EF of 60-65%. There is trivial AI and no MR. She says she is asymptomatic.  Past Medical History   Diagnosis  Date   .  Atypical chest pain      a. 11/2005 Negative Myoview   .  Subaortic membrane      a. 01/2010 Echo: EF 55-60%, No rwma, subaortic membrane with elevated LVOT mean gradient of 21 mmHg, Triv AI, mod dil LA, mildly increased PASP. b. 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  GERD (gastroesophageal reflux disease)    .  HTN (hypertension)    .  Iron deficiency anemia    .  History of substance abuse    .  Paroxysmal atrial  fibrillation  05/01/2013     On Xarelto   .  Diastolic dysfunction    .  Heart murmur    .  DM2 (diabetes mellitus, type 2)     Past Surgical History   Procedure  Laterality  Date   .  Cesarean section      Family History   Problem  Relation  Age of Onset   .  Hypertension  Sister      alive and well   .  Diabetes  Father      deceased   .  Cancer  Mother      deceased @ 70   .  Stroke  Mother    .  Hypertension  Sister      alive and well   .  Heart attack  Brother      deceased @ 85   Social History  History   Substance Use Topics   .  Smoking status:  Never Smoker   .  Smokeless tobacco:  Current User     Types:  Snuff   .  Alcohol Use:  Yes      Comment: Drinks 3 - 40 oz beers daily. i QUIT DOING THAT "  Current Outpatient Prescriptions   Medication  Sig  Dispense  Refill   .  amLODipine (NORVASC) 5 MG tablet  Take 2 tablets (10 mg total) by mouth daily.  30 tablet  6   .  antiseptic oral rinse (BIOTENE) LIQD  15 mLs by Mouth Rinse route daily.     .  Calcium Carb-Cholecalciferol (CALCIUM-VITAMIN D3) 500-400 MG-UNIT TABS  Take 1 tablet by mouth daily.     .  dabigatran (PRADAXA) 150 MG CAPS  Take 1 capsule (150 mg total) by mouth every 12 (twelve) hours.  60 capsule    .  ferrous sulfate 325 (65 FE) MG tablet  Take 325 mg by mouth daily with breakfast.     .  fish oil-omega-3 fatty acids 1000 MG capsule  Take 1 g by mouth daily.     .  furosemide (LASIX) 20 MG tablet  Take 1 tablet (20 mg total) by mouth daily.  30 tablet  3   .  glucose blood test strip  Use as instructed  100 each  12   .  glucose monitoring kit (FREESTYLE) monitoring kit  1 each by Does not apply route as needed for other.  1 each  0   .  Lancets (FREESTYLE) lancets  Use as instructed  100 each  12   .  metFORMIN (GLUCOPHAGE) 500 MG tablet  Take 1 tablet (500 mg total) by mouth 2 (two) times daily with a meal.  60 tablet  1   .  metoprolol tartrate (LOPRESSOR) 25 MG tablet  Take 1 tablet (25 mg  total) by mouth 2 (two) times daily.  60 tablet  1   .  Multiple Vitamins-Minerals (MULTIVITAMIN PO)  Take 1 tablet by mouth daily.     .  Multiple Vitamins-Minerals (MULTIVITAMIN) tablet  Take 1 tablet by mouth daily.     .  potassium chloride (K-DUR,KLOR-CON) 10 MEQ tablet  Take 1 tablet (10 mEq total) by mouth daily.  30 tablet  3   .  sertraline (ZOLOFT) 100 MG tablet  Take 0.5 tab po daily for 1 week, then take 1 po daily  30 tablet  3    No current facility-administered medications for this visit.    Allergies   Allergen  Reactions   .  Ketorolac Tromethamine  Other (See Comments)     Unknown reaction   Review of Systems  Constitutional: Negative.  HENT: Positive for dental problem.  Eyes: Negative.  Respiratory: Positive for cough. Negative for shortness of breath.  Cardiovascular: Negative for chest pain, palpitations and leg swelling.  Gastrointestinal: Negative.  Endocrine: Negative.  Genitourinary: Negative.  Musculoskeletal: Negative.  Skin: Negative.  Allergic/Immunologic: Negative.  Neurological: Negative.  Hematological: Negative.  Psychiatric/Behavioral:  Drug and alcohol abuse  BP 152/89  Pulse 68  Resp 16  Ht 5' 6.5" (1.689 m)  Wt 182 lb (82.555 kg)  BMI 28.94 kg/m2  SpO2 99%  Physical Exam  Constitutional: She is oriented to person, place, and time. She appears well-developed and well-nourished. No distress.  HENT:  Head: Normocephalic.  Poor dentition and oral hygeine  Eyes: Conjunctivae and EOM are normal. Pupils are equal, round, and reactive to light.  Neck: Normal range of motion. Neck supple. No JVD present. No tracheal deviation present. No thyromegaly present.  Cardiovascular: Normal rate, regular rhythm and intact distal pulses.  Murmur heard. 3/6 harsh systolic murmur heard throughout the precordium with radiation into the neck bilat.  Pulmonary/Chest: Effort normal and  breath sounds normal. No respiratory distress. She has no wheezes. She has  no rales.  Abdominal: Soft. Bowel sounds are normal. She exhibits no distension and no mass. There is no tenderness.  Musculoskeletal: Normal range of motion. She exhibits no edema.  Lymphadenopathy:  She has no cervical adenopathy.  Neurological: She is alert and oriented to person, place, and time. No cranial nerve deficit.  Skin: Skin is warm and dry.  Psychiatric: She has a normal mood and affect.  Diagnostic Tests:  [facility] [address] [place], [place] [address] [phone] --------------------------------------------------------------- Transesophageal Echocardiogram Patient: Jane Arellano MR Number: Study Date: 29-Nov-2005 --------------------------------------------------------------- Gender: F Age: 42 years DOB: 04/18/1961 Height: 66 in ( 168 cm ) Weight: 187 lb ( 85 kg ) BSA: 1.95 m^2 Pt. Status: Room: 4710 STAFF OPERATOR Will Edwards PERFORMING Norva Pavlov M.D. ORDERING Adeline Viyuoh CONSULTING Daniel Bensimhon ATTENDING Attaya Archie Patten Laurita Quint --------------------------------------------------------------- INDICATIONS: Evaluate suspected aortic stenosis. HISTORY: Chest pain. Dyspnea. Murmur. --------------------------------------------------------------- PROCEDURE INFORMATION: A transesophageal complete 2D study was performed. Additional evaluation included limited spectral Doppler, and color Doppler. An adult omni-plane probe was inserted by the attending cardiologist. This was a routine echocardiographic study. The study was performed in the Endoscopy suite. The patient was unable to respond to pain level query due to sedation. Image quality was good. The risks and alternatives of the procedure were explained to the patient and informed consent was obtained. Prior to the procedure, the oropharynx was anesthetized with topical benzocaine spray. The patient was given midazolam ( 6 mg ) and fentanyl ( 75 mcg ). There were  no complications during the transesophageal echo procedure. --------------------------------------------------------------- LEFT VENTRICLE: - Left ventricular size was normal. - Overall left ventricular systolic function was normal. - Left ventricular ejection fraction was estimated , range being 55 % to 65 %.. - Left ventricular wall thickness was increased in a pattern of mild concentric hypertrophy. AORTIC VALVE: - Small ridge in LVOT consistant with small subvalvular membrane (velocity accross membrane = 73m/s). - The aortic valve was trileaflet. - There was no evidence for aortic valve vegetation. Doppler interpretation(s): - There was no evidence for aortic valve stenosis. - There was trivial aortic valvular regurgitation. MITRAL VALVE: - Delicate mitral valve. - There was no evidence for mitral valvular vegetation. Doppler interpretation(s): - There was no evidence for mitral stenosis. - There was no significant mitral valvular regurgitation. LEFT ATRIUM: - The left atrium was mildly dilated. - The left atrial appendage size was normal. - There was no left atrial appendage thrombus identified. Doppler interpretation(s): - The left atrial appendage function was normal (normal emptying velocity). - There was no evidence for a patent foramen ovale by color. RIGHT VENTRICLE: - Right ventricular size was normal. - There were no right ventricular regional wall motion abnormalities. PULMONIC VALVE: - Delicate pulmonic valve. - There was no evidence for pulmonic valve vegetation. Doppler interpretation(s): - There was no pulmonic valve stenosis. - There was no significant pulmonic regurgitation. TRICUSPID VALVE: - Delicate tricuspid valve. - There was no evidence for tricuspid valve vegetation. Doppler interpretation(s): - There was no evidence for tricuspid stenosis. - There was no significant tricuspid valvular  regurgitation. --------------------------------------------------------------- SUMMARY - Overall left ventricular systolic function was normal. Left ventricular ejection fraction was estimated , range being 55 % to 65 %.. - Small ridge in LVOT consistant with small subvalvular membrane (velocity accross membrane = 36m/s). There was trivial aortic valvular regurgitation. - The left atrium was mildly dilated. The left  atrial appendage function was normal (normal emptying velocity). There was no left atrial appendage thrombus identified. --------------------------------------------------------------- Prepared and Electronically Authenticated by Norva Pavlov M.D. Confirmed 29-Nov-2005 14:47:13  Transthoracic Echocardiography  Patient: Jane Arellano, Jane Arellano MR #: [id number]   Study Date: 02/18/2010   Gender: F Age: 82 Height: 167.6cm Weight: 87.1kg BSA: 1.77m^2 Pt. Status: Room:  SONOGRAPHER Charlann Noss, RDCS ADMITTING Bensimhon, Daniel ATTENDING Bensimhon, Lanae Crumbly Bensimhon, Daniel PERFORMING [facility], [facility] cc: [facility].  -------------------------------------------------------------------- Indications: 424.1 Aortic valve disorders.  -------------------------------------------------------------------- History: PMH: Subaortic membrane with a LVOT gradient of 27 mmHg in 2009. Cardiac dysrhythmias. Acquired from the patient and from the patient's chart. Risk factors: Hypertension. Dyslipidemia.  -------------------------------------------------------------------- Study Conclusions  - Left ventricle: The cavity size was normal. Wall thickness was normal. Systolic function was normal. The estimated ejection fraction was in the range of 55% to 60%. Wall motion was normal; there were no regional wall motion abnormalities. - Aortic valve: Trivial regurgitation. - Left atrium: The atrium was moderately dilated. - Pulmonary arteries: Systolic  pressure was mildly increased. Impressions:  - Subaortic membrane with elevated LVOT mean gradient of 21 mmHg. Transthoracic echocardiography. M-mode, complete 2D, spectral Doppler, and color Doppler. Height: Height: 167.6cm. Height: 66in. Weight: Weight: 87.1kg. Weight: 191.6lb. Body mass index: BMI: 31kg/m^2. Body surface area: BSA: 1.50m^2. Blood pressure: 136/86. Patient status: Outpatient. Location: [place] [facility]  --------------------------------------------------------------------  -------------------------------------------------------------------- Left ventricle: The cavity size was normal. Wall thickness was normal. Systolic function was normal. The estimated ejection fraction was in the range of 55% to 60%. Wall motion was normal; there were no regional wall motion abnormalities.  -------------------------------------------------------------------- Aortic valve: Trileaflet; mildly thickened leaflets. Mobility was not restricted. Doppler: Transvalvular velocity was within the normal range. There was no stenosis. Trivial regurgitation.  -------------------------------------------------------------------- Aorta: Aortic root: The aortic root was normal in size.  -------------------------------------------------------------------- Mitral valve: Structurally normal valve. Mobility was not restricted. Doppler: Transvalvular velocity was within the normal range. There was no evidence for stenosis. Trivial regurgitation. Peak gradient: 80mm Hg (D). -------------------------------------------------------------------- Left atrium: The atrium was moderately dilated.  -------------------------------------------------------------------- Right ventricle: The cavity size was normal. Systolic function was normal.  -------------------------------------------------------------------- Pulmonic valve: Doppler: Transvalvular velocity was within the normal range. There was no evidence  for stenosis.  -------------------------------------------------------------------- Tricuspid valve: Structurally normal valve. Doppler: Transvalvular velocity was within the normal range. Trivial regurgitation.  -------------------------------------------------------------------- Pulmonary artery: Systolic pressure was mildly increased.  -------------------------------------------------------------------- Right atrium: The atrium was normal in size.  -------------------------------------------------------------------- Pericardium: There was no pericardial effusion.  -------------------------------------------------------------------- Systemic veins: Inferior vena cava: The vessel was normal in size.  --------------------------------------------------------------------  2D measurements Normal Doppler measurements Normal Left ventricle Mitral valve LVID ED, 49.3 mm 43-52 Peak E vel 97.2 cm/s ------- chord, PLAX Peak A vel 46.9 cm/s ------- LVID ES, 24.6 mm 23-38 Deceleration 176 ms 150-230 chord, PLAX time FS, chord, 50 % >29 Peak gradient, 4 mm Hg ------- PLAX D LVPW, ED 12.2 mm ------ Peak E/A ratio 2.1 ------- IVS/LVPW 0.78 <1.3 Tricuspid valve ratio, ED Regurg peak 265 cm/s ------- Ventricular septum vel IVS, ED 9.48 mm ------ Peak RV-RA 28 mm Hg ------- Aorta gradient, S Root diam, ED 21 mm ------ Left atrium AP dim 43 mm ------ AP dim index 2.18 cm/m^2 <2.2  -------------------------------------------------------------------- Prepared and Electronically Authenticated by  Kirk Ruths, MD, [facility] 2011-03-31T14:06:[id number]  *[facility]* [address] [place], [place] [address] [phone]  ------------------------------------------------------------ Transthoracic Echocardiography  Patient:  Jane Arellano, Jane Arellano MR #: [id number]   Study Date: 05/02/2013   Gender: F Age: 49 Height: 168.9cm Weight: 84.5kg BSA: 2.12m^2 Pt.  Status: Room: [place] Berge, Christopher ADMITTING Bensimhon, Daniel ATTENDING Bensimhon, Daniel cc:  ------------------------------------------------------------ LV EF: 60% - 65%  ------------------------------------------------------------ Indications: Previous study 02/18/2010.  ------------------------------------------------------------ History: PMH: Palpitations. Anemia. History of cocaine abuse. Aortic valve disorder. Subaortic membrane. Atrial fibrillation. Risk factors: Hypertension.  ------------------------------------------------------------ Study Conclusions  - Left ventricle: The cavity size was normal. Wall thickness was increased in a pattern of mild LVH. Systolic function was normal. The estimated ejection fraction was in the range of 60% to 65%. Wall motion was normal; there were no regional wall motion abnormalities. Doppler parameters are consistent with abnormal left ventricular relaxation (grade 1 diastolic dysfunction). - Aortic valve: No valvular aortic stenosis but suspect subaortic stenosis with mean gradient 42 mmHg/peak gradient 81 mmHg in the LVOT. Trivial regurgitation. - Mitral valve: No significant regurgitation. - Left atrium: The atrium was mildly dilated. - Right ventricle: Poorly visualized. The cavity size was normal. Systolic function was normal. - Right atrium: The atrium was mildly dilated. - Pulmonary arteries: No complete TR doppler jet so unable to estimate PA systolic pressure. - Inferior vena cava: The vessel was normal in size; the respirophasic diameter changes were in the normal range (= 50%); findings are consistent with normal central venous pressure. Impressions:  - Normal LV size with mild LV hypertrophy. EF 60-65%. There was no valvular aortic stenosis. There was turbulence in the LV outflow tract with possible subvalvular membrane. There was a  significant LVOT gradient mean 42 mmHg/peak 81 mmHg. Would consider TEE to evaluate subvalvular membrane. Transthoracic echocardiography. M-mode, complete 2D, spectral Doppler, and color Doppler. Height: Height: 168.9cm. Height: 66.5in. Weight: Weight: 84.5kg. Weight: 185.9lb. Body mass index: BMI: 29.6kg/m^2. Body surface area: BSA: 2.52m^2. Blood pressure: 154/91. Patient status: Inpatient. Location: ICU/CCU  ------------------------------------------------------------  ------------------------------------------------------------ Left ventricle: The cavity size was normal. Wall thickness was increased in a pattern of mild LVH. Systolic function was normal. The estimated ejection fraction was in the range of 60% to 65%. Wall motion was normal; there were no regional wall motion abnormalities. Doppler parameters are consistent with abnormal left ventricular relaxation (grade 1 diastolic dysfunction).  ------------------------------------------------------------ Aortic valve: Trileaflet; mildly calcified leaflets. No valvular aortic stenosis but suspect subaortic stenosis with mean gradient 42 mmHg/peak gradient 81 mmHg in the LVOT. Doppler: Trivial regurgitation.  ------------------------------------------------------------ Aorta: Aortic root: The aortic root was normal in size. Ascending aorta: The ascending aorta was normal in size.  ------------------------------------------------------------ Mitral valve: Doppler: There was no evidence for stenosis. No significant regurgitation. Peak gradient: 43mm Hg (D).  ------------------------------------------------------------ Left atrium: The atrium was mildly dilated.  ------------------------------------------------------------ Right ventricle: Poorly visualized. The cavity size was normal. Systolic function was normal.  ------------------------------------------------------------ Pulmonic valve: Structurally normal valve.  Cusp separation was normal. Doppler: Transvalvular velocity was within the normal range. No regurgitation.  ------------------------------------------------------------ Tricuspid valve: Doppler: Trivial regurgitation.  ------------------------------------------------------------ Pulmonary artery: No complete TR doppler jet so unable to estimate PA systolic pressure.  ------------------------------------------------------------ Right atrium: The atrium was mildly dilated.  ------------------------------------------------------------ Pericardium: There was no pericardial effusion.  ------------------------------------------------------------ Systemic veins: Inferior vena cava: The vessel was normal in size; the respirophasic diameter changes were in the normal range (= 50%); findings are consistent with normal central venous pressure.  ------------------------------------------------------------  2D measurements Normal Doppler Normal Left ventricle measurements LVID ED, 43.3 mm 43-52 Left ventricle chord,  Ea, lat 10.9 cm/ ------- PLAX ann, tiss s LVID ES, 22.8 mm 23-38 DP chord, E/Ea, lat 7.42 ------- PLAX ann, tiss FS, chord, 47 % >29 DP PLAX Ea, med 5.92 cm/ ------- LVPW, ED 13.1 mm ------ ann, tiss s IVS/LVPW 0.88 <1.3 DP ratio, ED E/Ea, med 13.67 ------- Ventricular septum ann, tiss IVS, ED 11.5 mm ------ DP LVOT Mitral valve Diam, S 15 mm ------ Peak E vel 80.9 cm/ ------- Area 1.77 cm^2 ------ s Aorta Peak A vel 87.8 cm/ ------- Root diam, 31 mm ------ s ED Deceleratio 317 ms 150-230 Left atrium n time AP dim 37 mm ------ Peak 3 mm ------- AP dim 1.84 cm/m^2 <2.2 gradient, D Hg index Peak E/A 0.9 ------- ratio Right ventricle Sa vel, lat 15.5 cm/ ------- ann, tiss s DP  ------------------------------------------------------------ Prepared and Electronically Authenticated by  Loralie Champagne 2014-06-12T10:32:06.920     Impression:    She has a  subaortic membrane with a mean gradient of 42 and a peak gradient of 81 across the LVOT. The 2D echo does not give any anatomic detail although I think it is clearly a membrane or ridge by TEE in 2007. Although she is asymptomatic I think this should be resected since she has a high gradient and it can eventually result in a fibrotic tunnel stenosis of the LVOT which can result in a much more complicated repair. It will also prevent progressive LV hypertrophy and dysfunction. Her aortic valve is fine. Her cardiac CT shows no significant coronary stenoses so a cardiac cath is not needed. She has had a couple episodes of atrial fibrillation, most recently in December requiring DCCV. I told her that I don't know if this is related to her subaortic stenosis. I discussed the surgical procedure with her and her family including alternatives, benefits and risks; including but not limited to bleeding, blood transfusion, infection, stroke, myocardial infarction, injury to the aortic valve, heart block requiring a permanent pacemaker, organ dysfunction, and death. Jane Arellano understands and agrees to proceed.    Plan:  Resection of the subaortic membrane.

## 2014-03-17 NOTE — Anesthesia Preprocedure Evaluation (Addendum)
Anesthesia Evaluation  Patient identified by MRN, date of birth, ID band Patient awake    Reviewed: Allergy & Precautions, H&P , NPO status , Patient's Chart, lab work & pertinent test results, reviewed documented beta blocker date and time   Airway Mallampati: II TM Distance: >3 FB Neck ROM: Full    Dental  (+) Teeth Intact, Missing   Pulmonary  breath sounds clear to auscultation        Cardiovascular hypertension, Pt. on medications and Pt. on home beta blockers + dysrhythmias Atrial Fibrillation + Valvular Problems/Murmurs Rhythm:Irregular     Neuro/Psych Anxiety    GI/Hepatic GERD-  Medicated and Controlled,  Endo/Other  diabetes, Well Controlled, Type 2, Oral Hypoglycemic Agents  Renal/GU      Musculoskeletal   Abdominal (+)  Abdomen: soft. Bowel sounds: normal.  Peds  Hematology  (+) anemia ,   Anesthesia Other Findings   Reproductive/Obstetrics                      Anesthesia Physical Anesthesia Plan  ASA: III  Anesthesia Plan: General   Post-op Pain Management:    Induction: Intravenous  Airway Management Planned: Oral ETT  Additional Equipment: Arterial line, CVP, PA Cath and Ultrasound Guidance Line Placement  Intra-op Plan:   Post-operative Plan: Post-operative intubation/ventilation  Informed Consent: I have reviewed the patients History and Physical, chart, labs and discussed the procedure including the risks, benefits and alternatives for the proposed anesthesia with the patient or authorized representative who has indicated his/her understanding and acceptance.   Dental advisory given  Plan Discussed with: CRNA and Surgeon  Anesthesia Plan Comments:        Anesthesia Quick Evaluation

## 2014-03-18 ENCOUNTER — Encounter (HOSPITAL_COMMUNITY): Payer: No Typology Code available for payment source | Admitting: Certified Registered Nurse Anesthetist

## 2014-03-18 ENCOUNTER — Encounter (HOSPITAL_COMMUNITY): Payer: Self-pay | Admitting: Surgery

## 2014-03-18 ENCOUNTER — Encounter (HOSPITAL_COMMUNITY)
Admission: RE | Disposition: A | Discharge status: Home / Self Care | Payer: No Typology Code available for payment source | Source: Ambulatory Visit | Attending: Surgery

## 2014-03-18 ENCOUNTER — Inpatient Hospital Stay (HOSPITAL_COMMUNITY)
Admission: RE | Admit: 2014-03-18 | Discharge: 2014-03-23 | DRG: 229 | Disposition: A | Discharge status: Home / Self Care | Payer: No Typology Code available for payment source | Source: Ambulatory Visit | Attending: Surgery | Admitting: Surgery

## 2014-03-18 ENCOUNTER — Inpatient Hospital Stay (HOSPITAL_COMMUNITY): Payer: No Typology Code available for payment source | Admitting: Certified Registered Nurse Anesthetist

## 2014-03-18 ENCOUNTER — Inpatient Hospital Stay (HOSPITAL_COMMUNITY): Payer: No Typology Code available for payment source

## 2014-03-18 DIAGNOSIS — K219 Gastro-esophageal reflux disease without esophagitis: Secondary | ICD-10-CM | POA: Diagnosis present

## 2014-03-18 DIAGNOSIS — Z888 Allergy status to other drugs, medicaments and biological substances status: Secondary | ICD-10-CM

## 2014-03-18 DIAGNOSIS — E8779 Other fluid overload: Secondary | ICD-10-CM | POA: Diagnosis not present

## 2014-03-18 DIAGNOSIS — E785 Hyperlipidemia, unspecified: Secondary | ICD-10-CM | POA: Diagnosis present

## 2014-03-18 DIAGNOSIS — D62 Acute posthemorrhagic anemia: Secondary | ICD-10-CM | POA: Diagnosis not present

## 2014-03-18 DIAGNOSIS — Q244 Congenital subaortic stenosis: Principal | ICD-10-CM

## 2014-03-18 DIAGNOSIS — I498 Other specified cardiac arrhythmias: Secondary | ICD-10-CM | POA: Diagnosis not present

## 2014-03-18 DIAGNOSIS — I359 Nonrheumatic aortic valve disorder, unspecified: Secondary | ICD-10-CM | POA: Diagnosis present

## 2014-03-18 DIAGNOSIS — Z8249 Family history of ischemic heart disease and other diseases of the circulatory system: Secondary | ICD-10-CM

## 2014-03-18 DIAGNOSIS — Z7901 Long term (current) use of anticoagulants: Secondary | ICD-10-CM

## 2014-03-18 DIAGNOSIS — E119 Type 2 diabetes mellitus without complications: Secondary | ICD-10-CM | POA: Diagnosis present

## 2014-03-18 DIAGNOSIS — K5900 Constipation, unspecified: Secondary | ICD-10-CM | POA: Diagnosis not present

## 2014-03-18 DIAGNOSIS — F1411 Cocaine abuse, in remission: Secondary | ICD-10-CM | POA: Diagnosis present

## 2014-03-18 DIAGNOSIS — I959 Hypotension, unspecified: Secondary | ICD-10-CM | POA: Diagnosis not present

## 2014-03-18 DIAGNOSIS — I10 Essential (primary) hypertension: Secondary | ICD-10-CM | POA: Diagnosis present

## 2014-03-18 DIAGNOSIS — Z833 Family history of diabetes mellitus: Secondary | ICD-10-CM

## 2014-03-18 DIAGNOSIS — Z823 Family history of stroke: Secondary | ICD-10-CM

## 2014-03-18 DIAGNOSIS — I4891 Unspecified atrial fibrillation: Secondary | ICD-10-CM | POA: Diagnosis present

## 2014-03-18 LAB — CBC
HCT: 34.6 % — ABNORMAL LOW (ref 36.0–46.0)
HCT: 32.2 % — ABNORMAL LOW (ref 36.0–46.0)
HEMOGLOBIN: 11.8 g/dL — AB (ref 12.0–15.0)
Hemoglobin: 11 g/dL — ABNORMAL LOW (ref 12.0–15.0)
MCH: 32.4 pg (ref 26.0–34.0)
MCH: 32.7 pg (ref 26.0–34.0)
MCHC: 34.1 g/dL (ref 30.0–36.0)
MCHC: 34.2 g/dL (ref 30.0–36.0)
MCV: 95.1 fL (ref 78.0–100.0)
MCV: 95.8 fL (ref 78.0–100.0)
Platelets: 129 10*3/uL — ABNORMAL LOW (ref 150–400)
Platelets: 120 10*3/uL — ABNORMAL LOW (ref 150–400)
RBC: 3.64 MIL/uL — ABNORMAL LOW (ref 3.87–5.11)
RBC: 3.36 MIL/uL — ABNORMAL LOW (ref 3.87–5.11)
RDW: 12.2 % (ref 11.5–15.5)
RDW: 12.4 % (ref 11.5–15.5)
WBC: 6.7 10*3/uL (ref 4.0–10.5)
WBC: 7.4 10*3/uL (ref 4.0–10.5)

## 2014-03-18 LAB — POCT I-STAT, CHEM 8
BUN: 5 mg/dL — ABNORMAL LOW (ref 6–23)
Calcium, Ion: 1.22 mmol/L (ref 1.12–1.23)
Chloride: 113 mEq/L — ABNORMAL HIGH (ref 96–112)
Creatinine, Ser: 0.5 mg/dL (ref 0.50–1.10)
Glucose, Bld: 108 mg/dL — ABNORMAL HIGH (ref 70–99)
HCT: 32 % — ABNORMAL LOW (ref 36.0–46.0)
HEMOGLOBIN: 10.9 g/dL — AB (ref 12.0–15.0)
POTASSIUM: 3.9 meq/L (ref 3.7–5.3)
SODIUM: 143 meq/L (ref 137–147)
TCO2: 22 mmol/L (ref 0–100)

## 2014-03-18 LAB — POCT I-STAT 3, ART BLOOD GAS (G3+)
ACID-BASE DEFICIT: 3 mmol/L — AB (ref 0.0–2.0)
ACID-BASE DEFICIT: 1 mmol/L (ref 0.0–2.0)
Acid-base deficit: 1 mmol/L (ref 0.0–2.0)
BICARBONATE: 24.8 meq/L — AB (ref 20.0–24.0)
Bicarbonate: 23.7 mEq/L (ref 20.0–24.0)
Bicarbonate: 23.4 mEq/L (ref 20.0–24.0)
Bicarbonate: 24.9 mEq/L — ABNORMAL HIGH (ref 20.0–24.0)
O2 Saturation: 100 %
O2 Saturation: 100 %
O2 Saturation: 99 %
O2 Saturation: 99 %
PCO2 ART: 35.9 mmHg (ref 35.0–45.0)
PCO2 ART: 45.3 mmHg — AB (ref 35.0–45.0)
PCO2 ART: 45.1 mmHg — AB (ref 35.0–45.0)
PH ART: 7.376 (ref 7.350–7.450)
PH ART: 7.424 (ref 7.350–7.450)
PH ART: 7.32 — AB (ref 7.350–7.450)
PO2 ART: 154 mmHg — AB (ref 80.0–100.0)
PO2 ART: 148 mmHg — AB (ref 80.0–100.0)
Patient temperature: 35.8
Patient temperature: 36.7
Patient temperature: 37.1
TCO2: 26 mmol/L (ref 0–100)
TCO2: 25 mmol/L (ref 0–100)
TCO2: 25 mmol/L (ref 0–100)
TCO2: 26 mmol/L (ref 0–100)
pCO2 arterial: 42.4 mmHg (ref 35.0–45.0)
pH, Arterial: 7.35 (ref 7.350–7.450)
pO2, Arterial: 315 mmHg — ABNORMAL HIGH (ref 80.0–100.0)
pO2, Arterial: 202 mmHg — ABNORMAL HIGH (ref 80.0–100.0)

## 2014-03-18 LAB — GLUCOSE, CAPILLARY
GLUCOSE-CAPILLARY: 106 mg/dL — AB (ref 70–99)
GLUCOSE-CAPILLARY: 111 mg/dL — AB (ref 70–99)
Glucose-Capillary: 117 mg/dL — ABNORMAL HIGH (ref 70–99)
Glucose-Capillary: 110 mg/dL — ABNORMAL HIGH (ref 70–99)
Glucose-Capillary: 103 mg/dL — ABNORMAL HIGH (ref 70–99)
Glucose-Capillary: 95 mg/dL (ref 70–99)
Glucose-Capillary: 91 mg/dL (ref 70–99)
Glucose-Capillary: 140 mg/dL — ABNORMAL HIGH (ref 70–99)
Glucose-Capillary: 118 mg/dL — ABNORMAL HIGH (ref 70–99)
Glucose-Capillary: 106 mg/dL — ABNORMAL HIGH (ref 70–99)

## 2014-03-18 LAB — POCT I-STAT 4, (NA,K, GLUC, HGB,HCT)
GLUCOSE: 129 mg/dL — AB (ref 70–99)
Glucose, Bld: 145 mg/dL — ABNORMAL HIGH (ref 70–99)
Glucose, Bld: 113 mg/dL — ABNORMAL HIGH (ref 70–99)
Glucose, Bld: 139 mg/dL — ABNORMAL HIGH (ref 70–99)
Glucose, Bld: 109 mg/dL — ABNORMAL HIGH (ref 70–99)
Glucose, Bld: 134 mg/dL — ABNORMAL HIGH (ref 70–99)
HCT: 25 % — ABNORMAL LOW (ref 36.0–46.0)
HCT: 32 % — ABNORMAL LOW (ref 36.0–46.0)
HCT: 24 % — ABNORMAL LOW (ref 36.0–46.0)
HCT: 35 % — ABNORMAL LOW (ref 36.0–46.0)
HEMATOCRIT: 32 % — AB (ref 36.0–46.0)
HEMATOCRIT: 26 % — AB (ref 36.0–46.0)
HEMOGLOBIN: 8.5 g/dL — AB (ref 12.0–15.0)
HEMOGLOBIN: 8.8 g/dL — AB (ref 12.0–15.0)
Hemoglobin: 10.9 g/dL — ABNORMAL LOW (ref 12.0–15.0)
Hemoglobin: 10.9 g/dL — ABNORMAL LOW (ref 12.0–15.0)
Hemoglobin: 8.2 g/dL — ABNORMAL LOW (ref 12.0–15.0)
Hemoglobin: 11.9 g/dL — ABNORMAL LOW (ref 12.0–15.0)
POTASSIUM: 3.4 meq/L — AB (ref 3.7–5.3)
Potassium: 3.6 mEq/L — ABNORMAL LOW (ref 3.7–5.3)
Potassium: 3.3 mEq/L — ABNORMAL LOW (ref 3.7–5.3)
Potassium: 3.8 mEq/L (ref 3.7–5.3)
Potassium: 4 mEq/L (ref 3.7–5.3)
Potassium: 3.6 meq/L — ABNORMAL LOW (ref 3.7–5.3)
SODIUM: 138 meq/L (ref 137–147)
Sodium: 143 mEq/L (ref 137–147)
Sodium: 140 mEq/L (ref 137–147)
Sodium: 143 mEq/L (ref 137–147)
Sodium: 141 mEq/L (ref 137–147)
Sodium: 144 meq/L (ref 137–147)

## 2014-03-18 LAB — HEMOGLOBIN AND HEMATOCRIT, BLOOD
HCT: 24.8 % — ABNORMAL LOW (ref 36.0–46.0)
HEMOGLOBIN: 8.4 g/dL — AB (ref 12.0–15.0)

## 2014-03-18 LAB — CREATININE, SERUM: CREATININE: 0.52 mg/dL (ref 0.50–1.10)

## 2014-03-18 LAB — PROTIME-INR
INR: 1.3 (ref 0.00–1.49)
Prothrombin Time: 15.9 seconds — ABNORMAL HIGH (ref 11.6–15.2)

## 2014-03-18 LAB — MAGNESIUM: Magnesium: 2.8 mg/dL — ABNORMAL HIGH (ref 1.5–2.5)

## 2014-03-18 LAB — PLATELET COUNT: Platelets: 101 10*3/uL — ABNORMAL LOW (ref 150–400)

## 2014-03-18 LAB — APTT: aPTT: 32 seconds (ref 24–37)

## 2014-03-18 SURGERY — EXCISION, MASS, MEDIASTINUM
Anesthesia: General

## 2014-03-18 MED ORDER — MAGNESIUM SULFATE 4000MG/100ML IJ SOLN
4.0000 g | Freq: Once | INTRAMUSCULAR | Status: AC
Start: 2014-03-18 — End: 2014-03-18
  Administered 2014-03-18: 4 g via INTRAVENOUS
  Filled 2014-03-18: qty 100

## 2014-03-18 MED ORDER — DEXTROSE 5 % IV SOLN
0.0000 ug/min | INTRAVENOUS | Status: DC
Start: 2014-03-18 — End: 2014-03-21
  Administered 2014-03-19: 20 ug/min via INTRAVENOUS
  Administered 2014-03-19: 5 ug/min via INTRAVENOUS
  Administered 2014-03-20: 20 ug/min via INTRAVENOUS
  Filled 2014-03-18 (×4): qty 2

## 2014-03-18 MED ORDER — FAMOTIDINE IN NACL 20-0.9 MG/50ML-% IV SOLN
20.0000 mg | Freq: Two times a day (BID) | INTRAVENOUS | Status: AC
  Administered 2014-03-18: 20 mg via INTRAVENOUS

## 2014-03-18 MED ORDER — ARTIFICIAL TEARS OP OINT
TOPICAL_OINTMENT | OPHTHALMIC | Status: DC | PRN
  Administered 2014-03-18: 1 via OPHTHALMIC

## 2014-03-18 MED ORDER — FENTANYL CITRATE 0.05 MG/ML IJ SOLN
INTRAMUSCULAR | Status: AC
  Filled 2014-03-18: qty 5

## 2014-03-18 MED ORDER — NITROGLYCERIN IN D5W 200-5 MCG/ML-% IV SOLN: 0.0000 ug/min | INTRAVENOUS | Status: DC

## 2014-03-18 MED ORDER — MIDAZOLAM HCL 2 MG/2ML IJ SOLN
INTRAMUSCULAR | Status: AC
  Filled 2014-03-18: qty 2

## 2014-03-18 MED ORDER — ROCURONIUM BROMIDE 100 MG/10ML IV SOLN
INTRAVENOUS | Status: DC | PRN
  Administered 2014-03-18 (×2): 50 mg via INTRAVENOUS
  Administered 2014-03-18: 40 mg via INTRAVENOUS
  Administered 2014-03-18: 10 mg via INTRAVENOUS

## 2014-03-18 MED ORDER — ASPIRIN EC 325 MG PO TBEC
325.0000 mg | DELAYED_RELEASE_TABLET | Freq: Every day | ORAL | Status: DC
  Administered 2014-03-19 – 2014-03-23 (×5): 325 mg via ORAL
  Filled 2014-03-18 (×5): qty 1

## 2014-03-18 MED ORDER — MIDAZOLAM HCL 2 MG/2ML IJ SOLN: 2.0000 mg | INTRAMUSCULAR | Status: DC | PRN

## 2014-03-18 MED ORDER — ACETAMINOPHEN 650 MG RE SUPP
650.0000 mg | Freq: Once | RECTAL | Status: AC
Start: 2014-03-18 — End: 2014-03-18
  Administered 2014-03-18: 650 mg via RECTAL

## 2014-03-18 MED ORDER — LIDOCAINE HCL (CARDIAC) 20 MG/ML IV SOLN
INTRAVENOUS | Status: AC
  Filled 2014-03-18: qty 5

## 2014-03-18 MED ORDER — OXYCODONE HCL 5 MG PO TABS
5.0000 mg | ORAL_TABLET | ORAL | Status: DC | PRN
  Administered 2014-03-19 (×4): 10 mg via ORAL
  Administered 2014-03-19 (×2): 5 mg via ORAL
  Administered 2014-03-20 – 2014-03-22 (×15): 10 mg via ORAL
  Administered 2014-03-23: 5 mg via ORAL
  Administered 2014-03-23: 10 mg via ORAL
  Filled 2014-03-18 (×6): qty 2
  Filled 2014-03-18: qty 1
  Filled 2014-03-18 (×7): qty 2
  Filled 2014-03-18 (×2): qty 1
  Filled 2014-03-18 (×7): qty 2

## 2014-03-18 MED ORDER — PROPOFOL 10 MG/ML IV BOLUS
INTRAVENOUS | Status: AC
  Filled 2014-03-18: qty 20

## 2014-03-18 MED ORDER — SODIUM CHLORIDE 0.9 % IJ SOLN: 3.0000 mL | INTRAMUSCULAR | Status: DC | PRN

## 2014-03-18 MED ORDER — SODIUM CHLORIDE 0.9 % IV SOLN
INTRAVENOUS | Status: DC
  Administered 2014-03-18: 20 mL/h via INTRAVENOUS

## 2014-03-18 MED ORDER — PHENYLEPHRINE HCL 10 MG/ML IJ SOLN
10.0000 mg | INTRAVENOUS | Status: DC | PRN
  Administered 2014-03-18: 10 ug/min via INTRAVENOUS

## 2014-03-18 MED ORDER — DOCUSATE SODIUM 100 MG PO CAPS
200.0000 mg | ORAL_CAPSULE | Freq: Every day | ORAL | Status: DC
  Administered 2014-03-19 – 2014-03-23 (×5): 200 mg via ORAL
  Filled 2014-03-18 (×5): qty 2

## 2014-03-18 MED ORDER — LIDOCAINE HCL (CARDIAC) 20 MG/ML IV SOLN
INTRAVENOUS | Status: DC | PRN
  Administered 2014-03-18: 60 mg via INTRAVENOUS

## 2014-03-18 MED ORDER — ROCURONIUM BROMIDE 50 MG/5ML IV SOLN
INTRAVENOUS | Status: AC
  Filled 2014-03-18: qty 1

## 2014-03-18 MED ORDER — HEMOSTATIC AGENTS (NO CHARGE) OPTIME
TOPICAL | Status: DC | PRN
  Administered 2014-03-18: 1 via TOPICAL

## 2014-03-18 MED ORDER — PANTOPRAZOLE SODIUM 40 MG PO TBEC
40.0000 mg | DELAYED_RELEASE_TABLET | Freq: Every day | ORAL | Status: DC
  Administered 2014-03-20 – 2014-03-23 (×4): 40 mg via ORAL
  Filled 2014-03-18 (×4): qty 1

## 2014-03-18 MED ORDER — ONDANSETRON HCL 4 MG/2ML IJ SOLN: 4.0000 mg | Freq: Four times a day (QID) | INTRAMUSCULAR | Status: DC | PRN

## 2014-03-18 MED ORDER — POTASSIUM CHLORIDE 10 MEQ/50ML IV SOLN
10.0000 meq | INTRAVENOUS | Status: AC
  Administered 2014-03-18 (×3): 10 meq via INTRAVENOUS

## 2014-03-18 MED ORDER — DEXTROSE 5 % IV SOLN
1.5000 g | Freq: Two times a day (BID) | INTRAVENOUS | Status: AC
  Administered 2014-03-18 – 2014-03-20 (×4): 1.5 g via INTRAVENOUS
  Filled 2014-03-18 (×4): qty 1.5

## 2014-03-18 MED ORDER — VANCOMYCIN HCL IN DEXTROSE 1-5 GM/200ML-% IV SOLN
1000.0000 mg | Freq: Once | INTRAVENOUS | Status: AC
  Administered 2014-03-18: 1000 mg via INTRAVENOUS
  Filled 2014-03-18: qty 200

## 2014-03-18 MED ORDER — MIDAZOLAM HCL 10 MG/2ML IJ SOLN
INTRAMUSCULAR | Status: AC
  Filled 2014-03-18: qty 2

## 2014-03-18 MED ORDER — ALBUMIN HUMAN 5 % IV SOLN
250.0000 mL | INTRAVENOUS | Status: AC | PRN
  Administered 2014-03-18 – 2014-03-19 (×3): 250 mL via INTRAVENOUS
  Filled 2014-03-18: qty 250

## 2014-03-18 MED ORDER — MORPHINE SULFATE 2 MG/ML IJ SOLN
2.0000 mg | INTRAMUSCULAR | Status: DC | PRN
  Administered 2014-03-19 (×3): 4 mg via INTRAVENOUS
  Filled 2014-03-18 (×3): qty 2

## 2014-03-18 MED ORDER — MORPHINE SULFATE 2 MG/ML IJ SOLN
1.0000 mg | INTRAMUSCULAR | Status: AC | PRN
  Administered 2014-03-18 (×3): 2 mg via INTRAVENOUS
  Filled 2014-03-18 (×3): qty 1

## 2014-03-18 MED ORDER — LACTATED RINGERS IV SOLN
INTRAVENOUS | Status: DC | PRN
  Administered 2014-03-18 (×3): via INTRAVENOUS

## 2014-03-18 MED ORDER — SODIUM CHLORIDE 0.9 % IJ SOLN
3.0000 mL | Freq: Two times a day (BID) | INTRAMUSCULAR | Status: DC
  Administered 2014-03-19 – 2014-03-22 (×3): 3 mL via INTRAVENOUS

## 2014-03-18 MED ORDER — SODIUM CHLORIDE 0.9 % IV SOLN: 250.0000 mL | INTRAVENOUS | Status: DC

## 2014-03-18 MED ORDER — FENTANYL CITRATE 0.05 MG/ML IJ SOLN
INTRAMUSCULAR | Status: DC | PRN
  Administered 2014-03-18: 150 ug via INTRAVENOUS
  Administered 2014-03-18: 50 ug via INTRAVENOUS
  Administered 2014-03-18: 1000 ug via INTRAVENOUS
  Administered 2014-03-18: 50 ug via INTRAVENOUS
  Administered 2014-03-18: 100 ug via INTRAVENOUS
  Administered 2014-03-18: 150 ug via INTRAVENOUS

## 2014-03-18 MED ORDER — THROMBIN 20000 UNITS EX SOLR
CUTANEOUS | Status: AC
  Filled 2014-03-18: qty 20000

## 2014-03-18 MED ORDER — BISACODYL 10 MG RE SUPP: 10.0000 mg | Freq: Every day | RECTAL | Status: DC

## 2014-03-18 MED ORDER — 0.9 % SODIUM CHLORIDE (POUR BTL) OPTIME
TOPICAL | Status: DC | PRN
  Administered 2014-03-18: 5000 mL

## 2014-03-18 MED ORDER — EPHEDRINE SULFATE 50 MG/ML IJ SOLN
INTRAMUSCULAR | Status: AC
  Filled 2014-03-18: qty 1

## 2014-03-18 MED ORDER — LACTATED RINGERS IV SOLN
INTRAVENOUS | Status: DC
  Administered 2014-03-18: 20 mL/h via INTRAVENOUS

## 2014-03-18 MED ORDER — PROTAMINE SULFATE 10 MG/ML IV SOLN
INTRAVENOUS | Status: DC | PRN
  Administered 2014-03-18 (×2): 50 mg via INTRAVENOUS
  Administered 2014-03-18: 20 mg via INTRAVENOUS
  Administered 2014-03-18: 70 mg via INTRAVENOUS

## 2014-03-18 MED ORDER — PROPOFOL 10 MG/ML IV BOLUS
INTRAVENOUS | Status: DC | PRN
  Administered 2014-03-18: 50 mg via INTRAVENOUS

## 2014-03-18 MED ORDER — PROTAMINE SULFATE 10 MG/ML IV SOLN
INTRAVENOUS | Status: AC
  Filled 2014-03-18: qty 25

## 2014-03-18 MED ORDER — ASPIRIN 81 MG PO CHEW: 324.0000 mg | CHEWABLE_TABLET | Freq: Every day | ORAL | Status: DC

## 2014-03-18 MED ORDER — CHLORHEXIDINE GLUCONATE 4 % EX LIQD
30.0000 mL | CUTANEOUS | Status: DC
  Filled 2014-03-18: qty 30

## 2014-03-18 MED ORDER — ACETAMINOPHEN 160 MG/5ML PO SOLN: 650.0000 mg | Freq: Once | ORAL | Status: AC

## 2014-03-18 MED ORDER — PHENYLEPHRINE 40 MCG/ML (10ML) SYRINGE FOR IV PUSH (FOR BLOOD PRESSURE SUPPORT)
PREFILLED_SYRINGE | INTRAVENOUS | Status: AC
  Filled 2014-03-18: qty 10

## 2014-03-18 MED ORDER — GELATIN ABSORBABLE MT POWD
OROMUCOSAL | Status: DC | PRN
  Administered 2014-03-18 (×3): via TOPICAL

## 2014-03-18 MED ORDER — BISACODYL 5 MG PO TBEC
10.0000 mg | DELAYED_RELEASE_TABLET | Freq: Every day | ORAL | Status: DC
  Administered 2014-03-19 – 2014-03-22 (×3): 10 mg via ORAL
  Filled 2014-03-18 (×3): qty 2

## 2014-03-18 MED ORDER — METOPROLOL TARTRATE 12.5 MG HALF TABLET
12.5000 mg | ORAL_TABLET | Freq: Two times a day (BID) | ORAL | Status: DC
  Filled 2014-03-18 (×3): qty 1

## 2014-03-18 MED ORDER — ACETAMINOPHEN 500 MG PO TABS
1000.0000 mg | ORAL_TABLET | Freq: Four times a day (QID) | ORAL | Status: DC
  Administered 2014-03-19 – 2014-03-23 (×19): 1000 mg via ORAL
  Filled 2014-03-18 (×25): qty 2

## 2014-03-18 MED ORDER — METOPROLOL TARTRATE 1 MG/ML IV SOLN: 2.5000 mg | INTRAVENOUS | Status: DC | PRN

## 2014-03-18 MED ORDER — MIDAZOLAM HCL 5 MG/5ML IJ SOLN
INTRAMUSCULAR | Status: DC | PRN
  Administered 2014-03-18 (×6): 2 mg via INTRAVENOUS

## 2014-03-18 MED ORDER — SODIUM CHLORIDE 0.45 % IV SOLN
INTRAVENOUS | Status: DC
  Administered 2014-03-18: 20 mL/h via INTRAVENOUS

## 2014-03-18 MED ORDER — HEPARIN SODIUM (PORCINE) 1000 UNIT/ML IJ SOLN
INTRAMUSCULAR | Status: AC
  Filled 2014-03-18: qty 1

## 2014-03-18 MED ORDER — ALBUMIN HUMAN 5 % IV SOLN
INTRAVENOUS | Status: DC | PRN
  Administered 2014-03-18: 10:00:00 via INTRAVENOUS

## 2014-03-18 MED ORDER — INSULIN REGULAR BOLUS VIA INFUSION
0.0000 [IU] | Freq: Three times a day (TID) | INTRAVENOUS | Status: DC
Start: 2014-03-18 — End: 2014-03-20
  Filled 2014-03-18: qty 10

## 2014-03-18 MED ORDER — HEPARIN SODIUM (PORCINE) 1000 UNIT/ML IJ SOLN
INTRAMUSCULAR | Status: DC | PRN
  Administered 2014-03-18: 25000 [IU] via INTRAVENOUS

## 2014-03-18 MED ORDER — METOPROLOL TARTRATE 25 MG/10 ML ORAL SUSPENSION
12.5000 mg | Freq: Two times a day (BID) | ORAL | Status: DC
  Filled 2014-03-18 (×3): qty 5

## 2014-03-18 MED ORDER — LACTATED RINGERS IV SOLN: 500.0000 mL | Freq: Once | INTRAVENOUS | Status: AC | PRN

## 2014-03-18 MED ORDER — DEXMEDETOMIDINE HCL IN NACL 200 MCG/50ML IV SOLN
0.1000 ug/kg/h | INTRAVENOUS | Status: DC
  Administered 2014-03-18: 0.7 ug/kg/h via INTRAVENOUS
  Administered 2014-03-19: 0.2 ug/kg/h via INTRAVENOUS
  Filled 2014-03-18 (×2): qty 50

## 2014-03-18 MED ORDER — ACETAMINOPHEN 160 MG/5ML PO SOLN
1000.0000 mg | Freq: Four times a day (QID) | ORAL | Status: DC
  Filled 2014-03-18: qty 40

## 2014-03-18 MED ORDER — SODIUM CHLORIDE 0.9 % IJ SOLN
INTRAMUSCULAR | Status: AC
  Filled 2014-03-18: qty 10

## 2014-03-18 MED ORDER — SUCCINYLCHOLINE CHLORIDE 20 MG/ML IJ SOLN
INTRAMUSCULAR | Status: AC
  Filled 2014-03-18: qty 1

## 2014-03-18 MED ORDER — INSULIN REGULAR HUMAN 100 UNIT/ML IJ SOLN
INTRAMUSCULAR | Status: DC
  Administered 2014-03-18: 2.2 [IU]/h via INTRAVENOUS
  Administered 2014-03-18: 0.8 [IU]/h via INTRAVENOUS
  Filled 2014-03-18 (×2): qty 1

## 2014-03-18 MED FILL — Mannitol IV Soln 20%: INTRAVENOUS | Qty: 500 | Status: AC

## 2014-03-18 MED FILL — Sodium Bicarbonate IV Soln 8.4%: INTRAVENOUS | Qty: 50 | Status: AC

## 2014-03-18 MED FILL — Heparin Sodium (Porcine) Inj 1000 Unit/ML: INTRAMUSCULAR | Qty: 30 | Status: AC

## 2014-03-18 MED FILL — Lidocaine HCl IV Inj 20 MG/ML: INTRAVENOUS | Qty: 5 | Status: AC

## 2014-03-18 MED FILL — Heparin Sodium (Porcine) Inj 1000 Unit/ML: INTRAMUSCULAR | Qty: 10 | Status: AC

## 2014-03-18 MED FILL — Sodium Chloride IV Soln 0.9%: INTRAVENOUS | Qty: 2000 | Status: AC

## 2014-03-18 MED FILL — Electrolyte-R (PH 7.4) Solution: INTRAVENOUS | Qty: 3000 | Status: AC

## 2014-03-18 SURGICAL SUPPLY — 66 items
ADAPTER CARDIO PERF ANTE/RETRO (ADAPTER) ×2 IMPLANT
ADPR PRFSN 84XANTGRD RTRGD (ADAPTER) ×1
ATTRACTOMAT 16X20 MAGNETIC DRP (DRAPES) ×2 IMPLANT
BAG DECANTER FOR FLEXI CONT (MISCELLANEOUS) ×2 IMPLANT
BLADE 10 SAFETY STRL DISP (BLADE) ×2 IMPLANT
BLADE STERNUM SYSTEM 6 (BLADE) ×2 IMPLANT
BLADE SURG 15 STRL LF DISP TIS (BLADE) ×1 IMPLANT
BLADE SURG 15 STRL SS (BLADE) ×2
CANISTER SUCTION 2500CC (MISCELLANEOUS) ×2 IMPLANT
CANNULA GUNDRY RCSP 15FR (MISCELLANEOUS) ×2 IMPLANT
CATH HEART VENT LEFT (CATHETERS) IMPLANT
CATH ROBINSON RED A/P 18FR (CATHETERS) ×6 IMPLANT
CATH THORACIC 36FR (CATHETERS) ×2 IMPLANT
CATH THORACIC 36FR RT ANG (CATHETERS) ×2 IMPLANT
CONT SPEC STER OR (MISCELLANEOUS) ×2 IMPLANT
COVER SURGICAL LIGHT HANDLE (MISCELLANEOUS) ×2 IMPLANT
CRADLE DONUT ADULT HEAD (MISCELLANEOUS) ×2 IMPLANT
DRAPE SLUSH/WARMER DISC (DRAPES) ×2 IMPLANT
DRSG COVADERM 4X14 (GAUZE/BANDAGES/DRESSINGS) ×2 IMPLANT
ELECT CAUTERY BLADE 6.4 (BLADE) ×2 IMPLANT
ELECT REM PT RETURN 9FT ADLT (ELECTROSURGICAL) ×4
ELECTRODE REM PT RTRN 9FT ADLT (ELECTROSURGICAL) ×2 IMPLANT
GLOVE BIO SURGEON STRL SZ 6 (GLOVE) ×2 IMPLANT
GLOVE BIO SURGEON STRL SZ 6.5 (GLOVE) ×4 IMPLANT
GLOVE BIO SURGEON STRL SZ7 (GLOVE) IMPLANT
GLOVE BIO SURGEON STRL SZ7.5 (GLOVE) IMPLANT
GLOVE BIO SURGEON STRL SZ8 (GLOVE) ×1 IMPLANT
GLOVE BIOGEL PI IND STRL 7.0 (GLOVE) IMPLANT
GLOVE BIOGEL PI INDICATOR 7.0 (GLOVE) ×6
GLOVE EUDERMIC 7 POWDERFREE (GLOVE) ×4 IMPLANT
GOWN STRL REUS W/ TWL LRG LVL3 (GOWN DISPOSABLE) ×4 IMPLANT
GOWN STRL REUS W/ TWL XL LVL3 (GOWN DISPOSABLE) ×1 IMPLANT
GOWN STRL REUS W/TWL LRG LVL3 (GOWN DISPOSABLE) ×10
GOWN STRL REUS W/TWL XL LVL3 (GOWN DISPOSABLE) ×2
HEART VENT LT CURVED (MISCELLANEOUS) ×2 IMPLANT
HEMOSTAT POWDER SURGIFOAM 1G (HEMOSTASIS) ×6 IMPLANT
HEMOSTAT SURGICEL 2X14 (HEMOSTASIS) ×2 IMPLANT
KIT BASIN OR (CUSTOM PROCEDURE TRAY) ×2 IMPLANT
KIT CATH CPB BARTLE (MISCELLANEOUS) ×2 IMPLANT
KIT ROOM TURNOVER OR (KITS) ×2 IMPLANT
KIT SUCTION CATH 14FR (SUCTIONS) ×2 IMPLANT
LINE VENT (MISCELLANEOUS) ×1 IMPLANT
NS IRRIG 1000ML POUR BTL (IV SOLUTION) ×12 IMPLANT
PACK OPEN HEART (CUSTOM PROCEDURE TRAY) ×2 IMPLANT
PAD ARMBOARD 7.5X6 YLW CONV (MISCELLANEOUS) ×4 IMPLANT
SET CARDIOPLEGIA MPS 5001102 (MISCELLANEOUS) ×1 IMPLANT
SPONGE GAUZE 4X4 12PLY (GAUZE/BANDAGES/DRESSINGS) ×2 IMPLANT
SPONGE GAUZE 4X4 12PLY STER LF (GAUZE/BANDAGES/DRESSINGS) ×1 IMPLANT
SUT BONE WAX W31G (SUTURE) ×2 IMPLANT
SUT ETHIBON 2 0 V 52N 30 (SUTURE) ×4 IMPLANT
SUT PROLENE 3 0 SH DA (SUTURE) IMPLANT
SUT PROLENE 3 0 SH1 36 (SUTURE) ×2 IMPLANT
SUT PROLENE 4 0 RB 1 (SUTURE) ×10
SUT PROLENE 4-0 RB1 .5 CRCL 36 (SUTURE) ×3 IMPLANT
SUT STEEL 6MS V (SUTURE) IMPLANT
SUT VIC AB 1 CTX 36 (SUTURE) ×4
SUT VIC AB 1 CTX36XBRD ANBCTR (SUTURE) ×2 IMPLANT
SUTURE E-PAK OPEN HEART (SUTURE) ×2 IMPLANT
SYSTEM SAHARA CHEST DRAIN ATS (WOUND CARE) ×2 IMPLANT
TAPE CLOTH SURG 4X10 WHT LF (GAUZE/BANDAGES/DRESSINGS) ×1 IMPLANT
TOWEL OR 17X24 6PK STRL BLUE (TOWEL DISPOSABLE) ×4 IMPLANT
TOWEL OR 17X26 10 PK STRL BLUE (TOWEL DISPOSABLE) ×4 IMPLANT
TRAY FOLEY IC TEMP SENS 16FR (CATHETERS) ×3 IMPLANT
UNDERPAD 30X30 INCONTINENT (UNDERPADS AND DIAPERS) ×2 IMPLANT
VENT LEFT HEART 12002 (CATHETERS) ×2
WATER STERILE IRR 1000ML POUR (IV SOLUTION) ×4 IMPLANT

## 2014-03-18 NOTE — Interval H&P Note (Signed)
History and Physical Interval Note:  03/18/2014 7:01 AM  Jane Arellano  has presented today for surgery, with the diagnosis of SUBAORTIC MEMBRANE  The various methods of treatment have been discussed with the patient and family. After consideration of risks, benefits and other options for treatment, the patient has consented to  Procedure(s) with comments: RESECTION OF SUBAORTIC MEMBRANE (N/A) - W/ TEE as a surgical intervention .  The patient's history has been reviewed, patient examined, no change in status, stable for surgery.  I have reviewed the patient's chart and labs.  Questions were answered to the patient's satisfaction.     Alleen BorneBryan K Jakiya Bookbinder

## 2014-03-18 NOTE — Progress Notes (Signed)
Utilization Review Completed.Nalleli Largent T Dowell4/28/2015  

## 2014-03-18 NOTE — Progress Notes (Signed)
CT surgery p.m. Rounds  Postop resection of subaortic valve membrane today Patient extubated Slow sinus rhythm currently atrially paced 80 per minute Stable hemodynamics Patient doing well

## 2014-03-18 NOTE — Op Note (Signed)
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 Median Sternotomy 2. Extracorporeal circulation 3.   Resection of subaortic membrane  Anesthesia:  General Endotracheal   Clinical History/Surgical Indication:  The patient is a 53 year old woman who presented in 2007 with atypical chest pain and ruled out for MI. She had a negative Myoview. She was noted to have a murmur and 2D echo followed by TEE showed what appeared to be a subaortic membrane with a velocity across this membrane of 4 m/s. The valve looked trileaflet with no stenosis and trivial regurgitation. She had a follow up echo in 01/2010 which showed a mean gradient across the LVOT of 21 mm Hg. She did not return for followup until she returned with rapid atrial fibrillation.She was treated with rate control and anticoagulation and converted to sinus. She was intially on Xarelto but developed some bleeding from gums and was switched to Pradaxa. Repeat 2D echo 05/02/2013 showed an increase in the subaortic gradient to a mean of 42 and a peak of 81 with mild LVH and normal systolic function with an EF of 60-65%. There is trivial AI and no MR. She says she is asymptomatic.   Jane Arellano* *Jane Arellano* [address] [place], [place] [address] [phone]  ------------------------------------------------------------ Transthoracic Echocardiography  Patient: Jane Arellano MR #: [id number] Study Date: 05/02/2013 Gender: F Age: 27 Height: 168.9cm Weight: 84.5kg BSA: 2.9m^2 Pt. Status: Room: 2925  PERFORMING [place] [place] SONOGRAPHER Jane Arellano Jane Arellano ADMITTING Jane Arellano ATTENDING Bensimhon,  Arellano cc:  ------------------------------------------------------------ LV EF: 60% - 65%  ------------------------------------------------------------ Indications: Previous study 02/18/2010.  ------------------------------------------------------------ History: PMH: Palpitations. Anemia. History of cocaine abuse. Aortic valve disorder. Subaortic membrane. Atrial fibrillation. Risk factors: Hypertension.  ------------------------------------------------------------ Study Conclusions  - Left ventricle: The cavity size was normal. Wall thickness was increased in a pattern of mild LVH. Systolic function was normal. The estimated ejection fraction was in the range of 60% to 65%. Wall motion was normal; there were no regional wall motion abnormalities. Doppler parameters are consistent with abnormal left ventricular relaxation (grade 1 diastolic dysfunction). - Aortic valve: No valvular aortic stenosis but suspect subaortic stenosis with mean gradient 42 mmHg/peak gradient 81 mmHg in the LVOT. Trivial regurgitation. - Mitral valve: No significant regurgitation. - Left atrium: The atrium was mildly dilated. - Right ventricle: Poorly visualized. The cavity size was normal. Systolic function was normal. - Right atrium: The atrium was mildly dilated. - Pulmonary arteries: No complete TR doppler jet so unable to estimate PA systolic pressure. - Inferior vena cava: The vessel was normal in size; the respirophasic diameter changes were in the normal range (= 50%); findings are consistent with normal central venous pressure. Impressions:  - Normal LV size with mild LV hypertrophy. EF 60-65%. There was no valvular aortic stenosis. There was turbulence in the LV outflow tract with possible subvalvular membrane. There was a significant LVOT gradient mean 42 mmHg/peak 81 mmHg. Would consider TEE to evaluate subvalvular membrane. Transthoracic echocardiography. M-mode, complete  2D, spectral Doppler, and color Doppler. Height: Height: 168.9cm. Height: 66.5in. Weight: Weight: 84.5kg. Weight: 185.9lb. Body mass index: BMI: 29.6kg/m^2. Body surface area: BSA: 2.34m^2. Blood pressure: 154/91. Patient status: Inpatient. Location: ICU/CCU  ------------------------------------------------------------  ------------------------------------------------------------ Left ventricle: The cavity size was  normal. Wall thickness was increased in a pattern of mild LVH. Systolic function was normal. The estimated ejection fraction was in the range of 60% to 65%. Wall motion was normal; there were no regional wall motion abnormalities. Doppler parameters are consistent with abnormal left ventricular relaxation (grade 1 diastolic dysfunction).  ------------------------------------------------------------ Aortic valve: Trileaflet; mildly calcified leaflets. No valvular aortic stenosis but suspect subaortic stenosis with mean gradient 42 mmHg/peak gradient 81 mmHg in the LVOT. Doppler: Trivial regurgitation.  ------------------------------------------------------------ Aorta: Aortic root: The aortic root was normal in size. Ascending aorta: The ascending aorta was normal in size.  ------------------------------------------------------------ Mitral valve: Doppler: There was no evidence for stenosis. No significant regurgitation. Peak gradient: 3mm Hg (D).  ------------------------------------------------------------ Left atrium: The atrium was mildly dilated.  ------------------------------------------------------------ Right ventricle: Poorly visualized. The cavity size was normal. Systolic function was normal.  ------------------------------------------------------------ Pulmonic valve: Structurally normal valve. Cusp separation was normal. Doppler: Transvalvular velocity was within the normal range. No  regurgitation.  ------------------------------------------------------------ Tricuspid valve: Doppler: Trivial regurgitation.  ------------------------------------------------------------ Pulmonary artery: No complete TR doppler jet so unable to estimate PA systolic pressure.  ------------------------------------------------------------ Right atrium: The atrium was mildly dilated.  ------------------------------------------------------------ Pericardium: There was no pericardial effusion.  ------------------------------------------------------------ Systemic veins: Inferior vena cava: The vessel was normal in size; the respirophasic diameter changes were in the normal range (= 50%); findings are consistent with normal central venous pressure.  ------------------------------------------------------------  2D measurements Normal Doppler Normal Left ventricle measurements LVID ED, 43.3 mm 43-52 Left ventricle chord, Ea, lat 10.9 cm/ ------- PLAX ann, tiss s LVID ES, 22.8 mm 23-38 DP chord, E/Ea, lat 7.42 ------- PLAX ann, tiss FS, chord, 47 % >29 DP PLAX Ea, med 5.92 cm/ ------- LVPW, ED 13.1 mm ------ ann, tiss s IVS/LVPW 0.88 <1.3 DP ratio, ED E/Ea, med 13.67 ------- Ventricular septum ann, tiss IVS, ED 11.5 mm ------ DP LVOT Mitral valve Diam, S 15 mm ------ Peak E vel 80.9 cm/ ------- Area 1.77 cm^2 ------ s Aorta Peak A vel 87.8 cm/ ------- Root diam, 31 mm ------ s ED Deceleratio 317 ms 150-230 Left atrium n time AP dim 37 mm ------ Peak 3 mm ------- AP dim 1.84 cm/m^2 <2.2 gradient, D Hg index Peak E/A 0.9 ------- ratio Right ventricle Sa vel, lat 15.5 cm/ ------- ann, tiss s DP  ------------------------------------------------------------ Prepared and Electronically Authenticated by  Marca AnconaMcLean, Dalton 2014-06-12T10:32:06.920   Preparation:  The patient was seen in the preoperative holding area and the correct patient, correct operation were confirmed  with the patient after reviewing the medical record and catheterization. The consent was signed by me. Preoperative antibiotics were given. A pulmonary arterial line and radial arterial line were placed by the anesthesia team. The patient was taken back to the operating room and positioned supine on the operating room table. After being placed under general endotracheal anesthesia by the anesthesia team a foley catheter was placed. The neck, chest, abdomen, and both legs were prepped with betadine soap and solution and draped in the usual sterile manner. A surgical time-out was taken and the correct patient and operative procedure were confirmed with the nursing and anesthesia staff.   Pre-bypass TEE:   Complete TEE assessment was performed by Dr. Arta BruceKevin Ossey.  This showed the subaortic membrane with a normal appearing tri-leaflet aortic valve with mild central AI. LV function was normal.   Post-bypass TEE:   Normal functioning  aortic valve with unchanged mild central AI. There was no significant gradient across the LVOT after resection.  Left ventricular function preserved. No mitral regurgitation.    Cardiopulmonary Bypass:  A median sternotomy was performed. The pericardium was opened in the midline. Right ventricular function appeared normal. The ascending aorta was of normal size and had no palpable plaque. There were no contraindications to aortic cannulation or cross-clamping. The patient was fully systemically heparinized and the ACT was maintained > 400 sec. The proximal aortic arch was cannulated with a 20 F aortic cannula for arterial inflow. Venous cannulation was performed via the right atrial appendage using a two-staged venous cannula. An antegrade cardioplegia/vent cannula was inserted into the mid-ascending aorta. A left ventricular vent was placed via the right superior pulmonary vein. A retrograde cardioplegia cannnula was placed into the coronary sinus via the right atrium.  Aortic occlusion was performed with a single cross-clamp. Systemic cooling to 32 degrees Centigrade and topical cooling of the heart with iced saline were used. Hyperkalemic antegrade cold blood cardioplegia was used to induce diastolic arrest and then cold blood retrograde cardioplegia was given at about 20 minute intervals throughout the period of arrest to maintain myocardial temperature at or below 10 degrees centigrade. A temperature probe was inserted into the interventricular septum and an insulating pad was placed in the pericardium. Carbon dioxide was insufflated into the pericardium at 5L/min throughout the procedure to minimize intracardiac air.   Resection of subaortic membrane:  A transverse aortotomy was performed 1 cm above the take-off of the right coronary artery. The native valve was tricuspid with mild thickening of the noncoronary leaflet. The ostia of the coronary arteries were in normal position and were not obstructed. The native valve leaflets were retracted to expose the subaortic membrane. This was a fibrous ridge that encircled the LVOT. It was completely resected without difficulty. Care was taken to remove all particulate debris. The left ventricle was directly inspected for debris and then irrigated with ice saline solution. The aortotomy was closed using 4-0 Prolene suture in 2 layers with felt strips to reinforce the closure.  Completion:  The patient was rewarmed to 37 degrees Centigrade. De-airing maneuvers were performed and the head placed in trendelenburg position. The crossclamp was removed with a time of 38 minutes. There was spontaneous return of sinus rhythm. The aortotomy was checked for hemostasis. Two temporary epicardial pacing wires were placed on the right atrium and two on the right ventricle. The left ventricular vent and retrograde cardioplegia cannulas were removed. The patient was weaned from CPB without difficulty on no inotropes. CPB time was 62 minutes.  Cardiac output was 6 LPM. Heparin was fully reversed with protamine and the aortic and venous cannulas removed. Hemostasis was achieved. Mediastinal drainage tubes were placed. The sternum was closed with double #6 stainless steel wires. The fascia was closed with continuous # 1 vicryl suture. The subcutaneous tissue was closed with 2-0 vicryl continuous suture. The skin was closed with 3-0 vicryl subcuticular suture. All sponge, needle, and instrument counts were reported correct at the end of the case. Dry sterile dressings were placed over the incisions and around the chest tubes which were connected to pleurevac suction. The patient was then transported to the surgical intensive care unit in critical but stable condition.

## 2014-03-18 NOTE — Anesthesia Postprocedure Evaluation (Signed)
  Anesthesia Post-op Note  Patient: Jane Arellano  Procedure(s) Performed: Procedure(s) with comments: RESECTION OF SUBAORTIC MEMBRANE (N/A) - W/ TEE  Patient Location: SICU  Anesthesia Type:General  Level of Consciousness: Patient remains intubated per anesthesia plan  Airway and Oxygen Therapy: Patient remains intubated per anesthesia plan  Post-op Pain: none  Post-op Assessment: Patient's Cardiovascular Status Stable  Post-op Vital Signs: stable  Last Vitals:  Filed Vitals:   03/18/14 0552  BP: 159/92  Pulse: 72  Temp: 36.8 C  Resp: 20    Complications: No apparent anesthesia complications

## 2014-03-18 NOTE — Anesthesia Postprocedure Evaluation (Signed)
Anesthesia Post Note  Patient: Jane LevyWindy Y Arellano  Procedure(s) Performed: Procedure(s) (LRB): RESECTION OF SUBAORTIC MEMBRANE (N/A)  Anesthesia type: General  Patient location: ICU  Post pain: Pain level controlled  Post assessment: Post-op Vital signs reviewed  Last Vitals:  Filed Vitals:   03/18/14 1345  BP: 91/74  Pulse: 86  Temp: 35.6 C  Resp: 10    Post vital signs: stable  Level of consciousness: Patient remains intubated per anesthesia plan  Complications: No apparent anesthesia complications

## 2014-03-18 NOTE — Transfer of Care (Signed)
Immediate Anesthesia Transfer of Care Note  Patient: Jane LevyWindy Y Arellano  Procedure(s) Performed: Procedure(s) with comments: RESECTION OF SUBAORTIC MEMBRANE (N/A) - W/ TEE  Patient Location: SICU  Anesthesia Type:General  Level of Consciousness: Patient remains intubated per anesthesia plan  Airway & Oxygen Therapy: Patient remains intubated per anesthesia plan  Post-op Assessment: Post -op Vital signs reviewed and stable  Post vital signs: stable  Complications: No apparent anesthesia complications

## 2014-03-18 NOTE — Procedures (Addendum)
Extubation Procedure Note  Patient Details:   Name: Denny LevyWindy Y Gosney DOB: 03/28/1961 MRN: [id number]   Airway Documentation:     Evaluation  O2 sats: stable throughout and currently acceptable Complications: No apparent complications Patient did tolerate procedure well. Bilateral Breath Sounds: Clear   Yes  NIF -40, VC 830  Denita Lungmily Caroline Trogdon 03/18/2014, 6:06 PM

## 2014-03-18 NOTE — Brief Op Note (Signed)
03/18/2014  10:09 AM  PATIENT:  Jane LevyWindy Y Arellano  53 y.o. female  PRE-OPERATIVE DIAGNOSIS:  SUBAORTIC MEMBRANE  POST-OPERATIVE DIAGNOSIS:  SUBAORTIC MEMBRANE  PROCEDURE:   MEDIAN STERNOTOMY FOR RESECTION OF SUBAORTIC MEMBRANE  SURGEON:  Alleen BorneBryan K Bartle, MD  ASSISTANT: Coral CeoGina Michael Ventresca, PA-C  ANESTHESIA:   general  PATIENT CONDITION:  ICU - intubated and hemodynamically stable.  PRE-OPERATIVE WEIGHT: 82 kg

## 2014-03-18 NOTE — Anesthesia Procedure Notes (Addendum)
Procedure Name: Intubation Date/Time: 03/18/2014 8:13 AM Performed by: Ellin GoodieWEAVER, Laportia Carley M Pre-anesthesia Checklist: Patient identified, Emergency Drugs available, Suction available, Patient being monitored and Timeout performed Patient Re-evaluated:Patient Re-evaluated prior to inductionOxygen Delivery Method: Circle system utilized Preoxygenation: Pre-oxygenation with 100% oxygen Intubation Type: IV induction Ventilation: Mask ventilation without difficulty Laryngoscope Size: Mac and 3 Grade View: Grade IV Tube type: Oral Tube size: 7.5 mm Number of attempts: 2 Placement Confirmation: ETT inserted through vocal cords under direct vision,  positive ETCO2 and breath sounds checked- equal and bilateral Secured at: 22 cm Tube secured with: Tape Dental Injury: Teeth and Oropharynx as per pre-operative assessment  Difficulty Due To: Difficult Airway- due to anterior larynx and Difficult Airway- due to dentition Future Recommendations: Recommend- induction with short-acting agent, and alternative techniques readily available Comments: DL x 2 with MAC 3 blade, view of epiglottis only.  Esophageal intubation noted on first pass (tube passed easily) and removed.  Dr. Michelle Piperssey intubated, cricoid pressure appied.  Easy mask and atraumatic induction and intubation noted.  Carlynn HeraldH Weaver, CRNA

## 2014-03-18 NOTE — Progress Notes (Signed)
Echo Lab  TransEchocardiogram completed.  Jane Arellano, RDCS 03/18/2014 8:49 AM

## 2014-03-19 ENCOUNTER — Inpatient Hospital Stay (HOSPITAL_COMMUNITY): Payer: No Typology Code available for payment source

## 2014-03-19 LAB — CBC
HCT: 30.2 % — ABNORMAL LOW (ref 36.0–46.0)
HEMATOCRIT: 28.3 % — AB (ref 36.0–46.0)
HEMOGLOBIN: 10.4 g/dL — AB (ref 12.0–15.0)
Hemoglobin: 9.6 g/dL — ABNORMAL LOW (ref 12.0–15.0)
MCH: 32.9 pg (ref 26.0–34.0)
MCH: 32.7 pg (ref 26.0–34.0)
MCHC: 34.4 g/dL (ref 30.0–36.0)
MCHC: 33.9 g/dL (ref 30.0–36.0)
MCV: 95.6 fL (ref 78.0–100.0)
MCV: 96.3 fL (ref 78.0–100.0)
PLATELETS: 115 10*3/uL — AB (ref 150–400)
PLATELETS: 104 10*3/uL — AB (ref 150–400)
RBC: 3.16 MIL/uL — AB (ref 3.87–5.11)
RBC: 2.94 MIL/uL — ABNORMAL LOW (ref 3.87–5.11)
RDW: 12 % (ref 11.5–15.5)
RDW: 12.4 % (ref 11.5–15.5)
WBC: 7.5 10*3/uL (ref 4.0–10.5)
WBC: 7.5 10*3/uL (ref 4.0–10.5)

## 2014-03-19 LAB — GLUCOSE, CAPILLARY
GLUCOSE-CAPILLARY: 101 mg/dL — AB (ref 70–99)
GLUCOSE-CAPILLARY: 104 mg/dL — AB (ref 70–99)
GLUCOSE-CAPILLARY: 116 mg/dL — AB (ref 70–99)
GLUCOSE-CAPILLARY: 118 mg/dL — AB (ref 70–99)
GLUCOSE-CAPILLARY: 125 mg/dL — AB (ref 70–99)
GLUCOSE-CAPILLARY: 129 mg/dL — AB (ref 70–99)
GLUCOSE-CAPILLARY: 99 mg/dL (ref 70–99)
Glucose-Capillary: 119 mg/dL — ABNORMAL HIGH (ref 70–99)
Glucose-Capillary: 123 mg/dL — ABNORMAL HIGH (ref 70–99)
Glucose-Capillary: 118 mg/dL — ABNORMAL HIGH (ref 70–99)
Glucose-Capillary: 114 mg/dL — ABNORMAL HIGH (ref 70–99)
Glucose-Capillary: 114 mg/dL — ABNORMAL HIGH (ref 70–99)
Glucose-Capillary: 117 mg/dL — ABNORMAL HIGH (ref 70–99)
Glucose-Capillary: 128 mg/dL — ABNORMAL HIGH (ref 70–99)
Glucose-Capillary: 122 mg/dL — ABNORMAL HIGH (ref 70–99)

## 2014-03-19 LAB — BASIC METABOLIC PANEL
BUN: 6 mg/dL (ref 6–23)
CHLORIDE: 107 meq/L (ref 96–112)
CO2: 25 meq/L (ref 19–32)
Calcium: 8.6 mg/dL (ref 8.4–10.5)
Creatinine, Ser: 0.59 mg/dL (ref 0.50–1.10)
GFR calc Af Amer: >90 mL/min (ref >90)
GFR calc non Af Amer: >90 mL/min (ref >90)
GLUCOSE: 117 mg/dL — AB (ref 70–99)
Potassium: 3.9 mEq/L (ref 3.7–5.3)
SODIUM: 142 meq/L (ref 137–147)

## 2014-03-19 LAB — CREATININE, SERUM
CREATININE: 0.5 mg/dL (ref 0.50–1.10)
GFR calc non Af Amer: >90 mL/min (ref >90)

## 2014-03-19 LAB — MAGNESIUM
MAGNESIUM: 2.3 mg/dL (ref 1.5–2.5)
Magnesium: 2.2 mg/dL (ref 1.5–2.5)

## 2014-03-19 MED ORDER — ENOXAPARIN SODIUM 40 MG/0.4ML ~~LOC~~ SOLN
40.0000 mg | Freq: Every day | SUBCUTANEOUS | Status: DC
  Administered 2014-03-19 – 2014-03-22 (×4): 40 mg via SUBCUTANEOUS
  Filled 2014-03-19 (×6): qty 0.4

## 2014-03-19 MED ORDER — INSULIN ASPART 100 UNIT/ML ~~LOC~~ SOLN
0.0000 [IU] | SUBCUTANEOUS | Status: DC
  Administered 2014-03-19 (×2): 2 [IU] via SUBCUTANEOUS

## 2014-03-19 NOTE — Progress Notes (Addendum)
      [address]       [place] [id number]             [phone]      1 Day Post-Op Procedure(s) (LRB): RESECTION OF SUBAORTIC MEMBRANE (N/A)  Subjective:  [name] states she is doing okay this morning.  She denies pain.  States she has been using her IS  Objective: Vital signs in last 24 hours: Temp:  [96.1 F (35.6 C)-100.8 F (38.2 C)] 99 F (37.2 C) (04/29 0715) Pulse Rate:  [78-87] 87 (04/29 0715) Cardiac Rhythm:  [-] Atrial paced (04/29 0730) Resp:  [0-25] 18 (04/29 0715) BP: (78-113)/(53-79) 82/57 mmHg (04/29 0700) SpO2:  [100 %] 100 % (04/29 0715) Arterial Line BP: (83-124)/(48-79) 97/57 mmHg (04/29 0715) FiO2 (%):  [40 %-50 %] 40 % (04/28 1550) Weight:  [181 lb (82.1 kg)-186 lb 15.2 oz (84.8 kg)] 186 lb 15.2 oz (84.8 kg) (04/29 0500)  Hemodynamic parameters for last 24 hours: PAP: (20-40)/(8-26) 32/17 mmHg CVP:  [14 mmHg] 14 mmHg CO:  [3.2 L/min-5.2 L/min] 4.1 L/min CI:  [1.7 L/min/m2-2.4 L/min/m2] 2.2 L/min/m2  Intake/Output from previous day: 04/28 0701 - 04/29 0700 In: 6640 [I.V.:4365; BJYNW:295Blood:645; NG/GT:30; IV Piggyback:1600] Out: 6900 [Urine:4895; Emesis/NG output:50; Blood:1405; Chest Tube:550]  General appearance: alert, cooperative and no distress Heart: regular rate and rhythm Lungs: clear to auscultation bilaterally Abdomen: soft, non-tender; bowel sounds normal; no masses,  no organomegaly Extremities: extremities normal, atraumatic, no cyanosis or edema Wound: clean and dry  Lab Results:  Recent Labs  03/18/14 1745 03/18/14 1748 03/19/14 0400  WBC 7.4  --  7.5  HGB 11.0* 10.9* 10.4*  HCT 32.2* 32.0* 30.2*  PLT 120*  --  115*   BMET:  Recent Labs  03/18/14 1748 03/19/14 0400  NA 143 142  K 3.9 3.9  CL 113* 107  CO2  --  25  GLUCOSE 108* 117*  BUN 5* 6  CREATININE 0.50 0.59  CALCIUM  --  8.6    PT/INR:  Recent Labs  03/18/14 1143  LABPROT 15.9*  INR 1.30   ABG    Component Value Date/Time   PHART 7.350 03/18/2014 1742   HCO3 24.9* 03/18/2014 1742   TCO2 22 03/18/2014 1748   ACIDBASEDEF 1.0 03/18/2014 1742   O2SAT 99.0 03/18/2014 1742   CBG (last 3)   Recent Labs  03/19/14 0503 03/19/14 0605 03/19/14 0726  GLUCAP 118* 114* 116*    Assessment/Plan: S/P Procedure(s) (LRB): RESECTION OF SUBAORTIC MEMBRANE (N/A)  1. CV- Sinus Huston FoleyBrady with rates in the 40s under pace maker, remains on Neo- will wean drip as tolerated 2. Pulm- CXR is clear, good use of IS, will wean oxygen as tolerated 3. Renal- creatinine WNL, weight is up 5 lbs, pressure has been borderline, will hold off on diuretics 4. Expected Blood Loss Anemia- Hgb stable at 10.4 will follow 5. CBGs controlled, not on insulin drip, will continue glucose checks and SSIP today 6. Dispo- patient looks great, wean Neo as tolerated, POD #1 progression orders   LOS: 1 day    Jane Arellano 03/19/2014   Chart reviewed, patient examined, agree with above. She is doing well. HR 45 sinus. Will hold off on beta blocker for now. She was on Coreg preop. ECG shows diffuse mild ST elevation consistent with pericarditis. She was on Pradaxa preop for A-fib. Will resume at discharge.

## 2014-03-19 NOTE — Progress Notes (Signed)
Patient ID: Jane LevyWindy Y Arellano, female   DOB: 03/08/1961, 53 y.o.   MRN: [id number] EVENING ROUNDS NOTE :     [address]       [place] [id number]             [phone]                 1 Day Post-Op Procedure(s) (LRB): RESECTION OF SUBAORTIC MEMBRANE (N/A)  Total Length of Stay:  LOS: 1 day  BP 97/71  Pulse 86  Temp(Src) 98.9 F (37.2 C) (Oral)  Resp 21  Ht 5' 6.5" (1.689 m)  Wt 186 lb 15.2 oz (84.8 kg)  BMI 29.73 kg/m2  SpO2 100%  .Intake/Output     04/28 0701 - 04/29 0700 04/29 0701 - 04/30 0700   P.O.  120   I.V. (mL/kg) 4365 (51.5) 322.6 (3.8)   Blood 645    NG/GT 30    IV Piggyback 1600 50   Total Intake(mL/kg) 6640 (78.3) 492.6 (5.8)   Urine (mL/kg/hr) 4895 (2.4) 1250 (1.4)   Emesis/NG output 50 (0)    Blood 1405 (0.7)    Chest Tube 550 (0.3)    Total Output 6900 1250   Net -260 -757.4          . sodium chloride Stopped (03/19/14 0900)  . sodium chloride 20 mL/hr at 03/18/14 2000  . sodium chloride    . insulin (NOVOLIN-R) infusion Stopped (03/19/14 0000)  . lactated ringers 20 mL/hr at 03/19/14 0800  . nitroGLYCERIN Stopped (03/18/14 1225)  . phenylephrine (NEO-SYNEPHRINE) Adult infusion 13.333 mcg/min (03/19/14 1400)     Lab Results  Component Value Date   WBC 7.5 03/19/2014   HGB 9.6* 03/19/2014   HCT 28.3* 03/19/2014   PLT 104* 03/19/2014   GLUCOSE 117* 03/19/2014   CHOL 157 05/02/2013   TRIG 131 05/02/2013   HDL 67 05/02/2013   LDLCALC 64 05/02/2013   ALT 22 03/14/2014   AST 19 03/14/2014   NA 142 03/19/2014   K 3.9 03/19/2014   CL 107 03/19/2014   CREATININE 0.59 03/19/2014   BUN 6 03/19/2014   CO2 25 03/19/2014   TSH 1.178 05/01/2013   INR 1.30 03/18/2014   HGBA1C 6.4* 03/14/2014   MICROALBUR 0.50 07/24/2009   Stable day Sinus rhythm   [name] MD  Beeper [phone] Office [phone] 03/19/2014 5:30 PM

## 2014-03-19 NOTE — Progress Notes (Signed)
EKG CRITICAL VALUE     12 lead EKG performed.  Critical value noted .Rita OharaLiz  Mcatee  RN notified.   Lanae BoastGlenn Rasheida Broden, CCT 03/19/2014 7:59 AM

## 2014-03-20 ENCOUNTER — Inpatient Hospital Stay (HOSPITAL_COMMUNITY): Payer: No Typology Code available for payment source

## 2014-03-20 LAB — GLUCOSE, CAPILLARY
GLUCOSE-CAPILLARY: 144 mg/dL — AB (ref 70–99)
Glucose-Capillary: 114 mg/dL — ABNORMAL HIGH (ref 70–99)
Glucose-Capillary: 118 mg/dL — ABNORMAL HIGH (ref 70–99)
Glucose-Capillary: 132 mg/dL — ABNORMAL HIGH (ref 70–99)
Glucose-Capillary: 135 mg/dL — ABNORMAL HIGH (ref 70–99)
Glucose-Capillary: 138 mg/dL — ABNORMAL HIGH (ref 70–99)
Glucose-Capillary: 97 mg/dL (ref 70–99)

## 2014-03-20 LAB — PULMONARY FUNCTION TEST
DL/VA % pred: 108 %
DL/VA: 5.57 ml/min/mmHg/L
DLCO COR % PRED: 83 %
DLCO UNC: 23.72 ml/min/mmHg
DLCO cor: 23.72 ml/min/mmHg
DLCO unc % pred: 83 %
FEF 25-75 Post: 2.85 L/sec
FEF 25-75 Pre: 2.07 L/sec
FEF2575-%Change-Post: 37 %
FEF2575-%PRED-PRE: 81 %
FEF2575-%Pred-Post: 112 %
FEV1-%Change-Post: 5 %
FEV1-%Pred-Post: 94 %
FEV1-%Pred-Pre: 89 %
FEV1-Post: 2.38 L
FEV1-Pre: 2.25 L
FEV1FVC-%CHANGE-POST: 3 %
FEV1FVC-%Pred-Pre: 97 %
FEV6-%CHANGE-POST: 2 %
FEV6-%PRED-POST: 94 %
FEV6-%PRED-PRE: 92 %
FEV6-PRE: 2.83 L
FEV6-Post: 2.91 L
FEV6FVC-%Change-Post: 0 %
FEV6FVC-%PRED-PRE: 102 %
FEV6FVC-%Pred-Post: 103 %
FVC-%Change-Post: 2 %
FVC-%PRED-POST: 92 %
FVC-%Pred-Pre: 90 %
FVC-PRE: 2.85 L
FVC-Post: 2.91 L
POST FEV6/FVC RATIO: 100 %
Post FEV1/FVC ratio: 82 %
Pre FEV1/FVC ratio: 79 %
Pre FEV6/FVC Ratio: 99 %
RV % pred: 115 %
RV: 2.32 L
TLC % PRED: 96 %
TLC: 5.31 L

## 2014-03-20 LAB — PREPARE RBC (CROSSMATCH)

## 2014-03-20 LAB — CBC
HCT: 25.8 % — ABNORMAL LOW (ref 36.0–46.0)
Hemoglobin: 8.6 g/dL — ABNORMAL LOW (ref 12.0–15.0)
MCH: 32.5 pg (ref 26.0–34.0)
MCHC: 33.3 g/dL (ref 30.0–36.0)
MCV: 97.4 fL (ref 78.0–100.0)
PLATELETS: 103 10*3/uL — AB (ref 150–400)
RBC: 2.65 MIL/uL — ABNORMAL LOW (ref 3.87–5.11)
RDW: 12.6 % (ref 11.5–15.5)
WBC: 8.2 10*3/uL (ref 4.0–10.5)

## 2014-03-20 LAB — BASIC METABOLIC PANEL
BUN: 7 mg/dL (ref 6–23)
CALCIUM: 8.5 mg/dL (ref 8.4–10.5)
CO2: 25 mEq/L (ref 19–32)
Chloride: 105 mEq/L (ref 96–112)
Creatinine, Ser: 0.55 mg/dL (ref 0.50–1.10)
GFR calc Af Amer: >90 mL/min (ref >90)
Glucose, Bld: 118 mg/dL — ABNORMAL HIGH (ref 70–99)
Potassium: 3.7 mEq/L (ref 3.7–5.3)
SODIUM: 141 meq/L (ref 137–147)

## 2014-03-20 MED ORDER — POTASSIUM CHLORIDE 10 MEQ/50ML IV SOLN
10.0000 meq | INTRAVENOUS | Status: DC | PRN
  Administered 2014-03-20 (×2): 10 meq via INTRAVENOUS
  Filled 2014-03-20: qty 50

## 2014-03-20 MED ORDER — INSULIN ASPART 100 UNIT/ML ~~LOC~~ SOLN
0.0000 [IU] | SUBCUTANEOUS | Status: DC
Start: 2014-03-20 — End: 2014-03-20
  Administered 2014-03-20 (×2): 2 [IU] via SUBCUTANEOUS

## 2014-03-20 MED ORDER — METFORMIN HCL 500 MG PO TABS
500.0000 mg | ORAL_TABLET | Freq: Two times a day (BID) | ORAL | Status: DC
  Administered 2014-03-20 – 2014-03-23 (×7): 500 mg via ORAL
  Filled 2014-03-20 (×10): qty 1

## 2014-03-20 MED ORDER — INSULIN ASPART 100 UNIT/ML ~~LOC~~ SOLN
0.0000 [IU] | Freq: Three times a day (TID) | SUBCUTANEOUS | Status: DC
  Administered 2014-03-20 – 2014-03-21 (×3): 2 [IU] via SUBCUTANEOUS
  Administered 2014-03-22: 3 [IU] via SUBCUTANEOUS

## 2014-03-20 MED ORDER — POTASSIUM CHLORIDE 10 MEQ/50ML IV SOLN
INTRAVENOUS | Status: AC
  Administered 2014-03-20: 10 meq via INTRAVENOUS
  Filled 2014-03-20: qty 150

## 2014-03-20 MED FILL — Magnesium Sulfate Inj 50%: INTRAMUSCULAR | Qty: 10 | Status: AC

## 2014-03-20 MED FILL — Potassium Chloride Inj 2 mEq/ML: INTRAVENOUS | Qty: 40 | Status: AC

## 2014-03-20 MED FILL — Heparin Sodium (Porcine) Inj 1000 Unit/ML: INTRAMUSCULAR | Qty: 30 | Status: AC

## 2014-03-20 NOTE — Progress Notes (Signed)
TCTS BRIEF SICU PROGRESS NOTE  2 Days Post-Op  S/P Procedure(s) (LRB): RESECTION OF SUBAORTIC MEMBRANE (N/A)   Stable day NSR w/ stable BP off neo drip UOP adequate  Plan: Continue current plan  Purcell NailsClarence H Mitchel Delduca 03/20/2014 7:56 PM

## 2014-03-20 NOTE — Progress Notes (Signed)
Spoke with Dr. Tyrone SageGerhardt regarding increasing Neosynephrine requirements. Started my shift on 5mcg of Neo, and am now on 50mcg with a SBP 88-99 (MAP 59-62.) This morning's hemoglobin is 8.6 (yesterday morning's was 10.4.) Currently have patient paced AAI 80. Underlying rhythm is SR 60s. SBP in low 70s when not paced. Order received to give 1 unit PRBC. Will continue to closely monitor. Thresa RossA. Haggard RN

## 2014-03-20 NOTE — Progress Notes (Signed)
2 Days Post-Op Procedure(s) (LRB): RESECTION OF SUBAORTIC MEMBRANE (N/A) Subjective:  No complaints  Objective:  Increasing need for neo this am and Hgb 8.6 so she was given a unit of prbc's.  Vital signs in last 24 hours: Temp:  [98 F (36.7 C)-100 F (37.8 C)] 98.8 F (37.1 C) (04/30 0600) Pulse Rate:  [65-86] 79 (04/30 0715) Cardiac Rhythm:  [-] Atrial paced (04/30 0600) Resp:  [0-25] 16 (04/30 0715) BP: (74-117)/(30-72) 91/57 mmHg (04/30 0700) SpO2:  [87 %-100 %] 97 % (04/30 0715) Arterial Line BP: (73-152)/(36-92) 99/57 mmHg (04/30 0715) Weight:  [84.823 kg (187 lb)] 84.823 kg (187 lb) (04/30 0615)  Hemodynamic parameters for last 24 hours: PAP: (29)/(14) 29/14 mmHg  Intake/Output from previous day: 04/29 0701 - 04/30 0700 In: 2108.4 [P.O.:980; I.V.:803.4; Blood:175; IV Piggyback:150] Out: 4525 [Urine:4525] Intake/Output this shift:    General appearance: alert and cooperative Neurologic: intact Heart: regular rate and rhythm, S1, S2 normal, no murmur, click, rub or gallop Lungs: clear to auscultation bilaterally Extremities: extremities normal, atraumatic, no cyanosis or edema Wound: dressing dry  Lab Results:  Recent Labs  03/19/14 1636 03/20/14 0315  WBC 7.5 8.2  HGB 9.6* 8.6*  HCT 28.3* 25.8*  PLT 104* 103*   BMET:  Recent Labs  03/19/14 0400 03/19/14 1636 03/20/14 0315  NA 142  --  141  K 3.9  --  3.7  CL 107  --  105  CO2 25  --  25  GLUCOSE 117*  --  118*  BUN 6  --  7  CREATININE 0.59 0.50 0.55  CALCIUM 8.6  --  8.5    PT/INR:  Recent Labs  03/18/14 1143  LABPROT 15.9*  INR 1.30   ABG    Component Value Date/Time   PHART 7.350 03/18/2014 1742   HCO3 24.9* 03/18/2014 1742   TCO2 22 03/18/2014 1748   ACIDBASEDEF 1.0 03/18/2014 1742   O2SAT 99.0 03/18/2014 1742   CBG (last 3)   Recent Labs  03/19/14 1922 03/19/14 2354 03/20/14 0327  GLUCAP 101* 97 114*    Assessment/Plan: S/P Procedure(s) (LRB): RESECTION OF SUBAORTIC  MEMBRANE (N/A) Vasodilated postop: wean neo as tolerated. Weight is 6 lbs over preop but spontaneously diuresing. Mobilize Diabetes control   LOS: 2 days    Alleen BorneBryan K Bartle 03/20/2014

## 2014-03-21 LAB — GLUCOSE, CAPILLARY
GLUCOSE-CAPILLARY: 151 mg/dL — AB (ref 70–99)
Glucose-Capillary: 135 mg/dL — ABNORMAL HIGH (ref 70–99)
Glucose-Capillary: 138 mg/dL — ABNORMAL HIGH (ref 70–99)
Glucose-Capillary: 116 mg/dL — ABNORMAL HIGH (ref 70–99)

## 2014-03-21 LAB — TYPE AND SCREEN
ABO/RH(D): A POS
Antibody Screen: NEGATIVE
UNIT DIVISION: 0

## 2014-03-21 MED ORDER — SODIUM CHLORIDE 0.9 % IJ SOLN
3.0000 mL | Freq: Two times a day (BID) | INTRAMUSCULAR | Status: DC
Start: 2014-03-21 — End: 2014-03-23
  Administered 2014-03-21 – 2014-03-22 (×4): 3 mL via INTRAVENOUS

## 2014-03-21 MED ORDER — LACTULOSE 10 GM/15ML PO SOLN
20.0000 g | Freq: Once | ORAL | Status: DC
  Filled 2014-03-21: qty 30

## 2014-03-21 MED ORDER — SODIUM CHLORIDE 0.9 % IJ SOLN: 3.0000 mL | INTRAMUSCULAR | Status: DC | PRN

## 2014-03-21 MED ORDER — MOVING RIGHT ALONG BOOK
Freq: Once | Status: AC
  Administered 2014-03-21: 08:00:00
  Filled 2014-03-21: qty 1

## 2014-03-21 MED ORDER — SODIUM CHLORIDE 0.9 % IV SOLN: 250.0000 mL | INTRAVENOUS | Status: DC | PRN

## 2014-03-21 NOTE — Discharge Summary (Signed)
Physician Discharge Summary  Patient ID: Jane Arellano MRN: [id number] DOB/AGE: 54/26/1962 54 y.o.  Admit date: 03/18/2014 Discharge date: 03/21/2014  Admission Diagnoses:  Patient Active Problem List   Diagnosis Date Noted  . PAF (paroxysmal atrial fibrillation) 05/08/2013  . 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 Hyperglycemia 05/02/2013  . ETOH abuse 05/02/2013  . Marijuana abuse 05/02/2013  . Diastolic dysfunction 05/14/7627  . Depression with anxiety 05/01/2013  . Subaortic membrane   . HTN (hypertension)   . 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 GERD 04/30/2007   Discharge Diagnoses:   Patient Active Problem List   Diagnosis Date Noted  . PAF (paroxysmal atrial fibrillation) 05/08/2013  . 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 Hyperglycemia 05/02/2013  . ETOH abuse 05/02/2013  . Marijuana abuse 05/02/2013  . Diastolic dysfunction 31/51/7616  . Depression with anxiety 05/01/2013  . Subaortic membrane   . HTN (hypertension)   . 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 GERD 04/30/2007   Discharged Condition: good  History of Present Illness:   Jane Arellano is a 53 yo African American Female with known history of Sub Aortic Membrane.  She originally presented in 2007 with complaints of atypical chest pain.  She was ruled out for acute MI at that time.  Further workup with Myoview was also negative.  During examination she was found to have a heart murmur.  She underwent 2D echo which showed evidence of a sub aortic membrane.  Her Aortic valve was tri-leaflet and no stenosis was present.  She initially followed up with repeat ECHO in 2011.  However, she was lost to follow up after that.  She later developed Atrial Fibrillation and presented for further evaluation.  She was subsequently treated with rate control, started on anticoagulation and  she later converted to normal sinus rhythm.  She underwent repeat ECHO on 05/02/13 which showed an increase in the subaortic gradient to a mean of 42 and a peak of 81.  There was also trivial AI.  It was felt she should consider surgical resection and she was referred to Dr. Cyndia Bent at [facility] for possible surgical intervention.  She was evaluated on 11/27/2013 at which time Dr. Cyndia Bent told her Aortic Valve is okay and would not require intervention.  However, she would be a candidate to have her sub aortic membrane resected.  It was also explained to the patient that this is not likely a cause for her Atrial Fibrillation.  The risks and benefits of the procedure were explained to the patient and she was agreeable to proceed.   Hospital Course:   She presented to [facility] on 03/18/14.  She was taken to the operating room and underwent Median Sternotomy with Resection of Sub Aortic Membrane.  She tolerated the procedure well and was taken to the SICU in stable condition.  She was extubated the evening of surgery.  During her stay in the ICU the patient was weaned of Neo Synephrine drip for hypotension as tolerated.  She required Atrial Pacing due to sinus bradycardia with a rate in the 40s.  Due to persistent hypotension the patient was given 1 unit of packed red cells for a Hgb of 8.6.  Her chest tubes and arterial lines were removed without difficulty.  She was medically stable and transferred to the step down unit in stable condition.    The patient continues to progress.  She remains bradycardic and  hypotensive.  She will not be placed on a Beta blocker and antihypertensive agents due to this.  Her pacing wires were removed without difficulty.  She is ambulating with minimal assistance.  She is tolerating a carb modified diet.  Should no further issues arise we anticipate discharge in the next 24-48 hours.    Significant Diagnostic Studies: ECHO  Study Conclusions  - Left ventricle: The cavity size  was normal. Wall thickness was increased in a pattern of mild LVH. Systolic function was normal. The estimated ejection fraction was in the range of 60% to 65%. Wall motion was normal; there were no regional wall motion abnormalities. Doppler parameters are consistent with abnormal left ventricular relaxation (grade 1 diastolic dysfunction). - Aortic valve: No valvular aortic stenosis but suspect subaortic stenosis with mean gradient 42 mmHg/peak gradient 81 mmHg in the LVOT. Trivial regurgitation. - Mitral valve: No significant regurgitation. - Left atrium: The atrium was mildly dilated. - Right ventricle: Poorly visualized. The cavity size was normal. Systolic function was normal. - Right atrium: The atrium was mildly dilated. - Pulmonary arteries: No complete TR doppler jet so unable to estimate PA systolic pressure. - Inferior vena cava: The vessel was normal in size; the respirophasic diameter changes were in the normal range (= 50%); findings are consistent with normal central venous pressure. Impressions:  - Normal LV size with mild LV hypertrophy. EF 60-65%. There was no valvular aortic stenosis. There was turbulence in the LV outflow tract with possible subvalvular membrane. There was a significant LVOT gradient mean 42 mmHg/peak 81 mmHg. Would consider TEE to evaluate subvalvular membrane.  Treatments: surgery:   1. Median Sternotomy 2. Extracorporeal circulation 3. Resection of subaortic membrane   Disposition: 01-Home or Self Care  Discharge Medications:     Medication List    STOP taking these medications       amLODipine 10 MG tablet  Commonly known as:  NORVASC     carvedilol 6.25 MG tablet  Commonly known as:  COREG     dabigatran 150 MG Caps capsule  Commonly known as:  PRADAXA     furosemide 20 MG tablet  Commonly known as:  LASIX     potassium chloride 10 MEQ tablet  Commonly known as:  K-DUR      TAKE these medications        acetaminophen 500 MG tablet  Commonly known as:  TYLENOL  Take 1,000 mg by mouth every 6 (six) hours as needed for mild pain.     antiseptic oral rinse Liqd  15 mLs by Mouth Rinse route 2 (two) times daily as needed for dry mouth.     aspirin 325 MG EC tablet  Take 1 tablet (325 mg total) by mouth daily.     Calcium-Vitamin D3 500-400 MG-UNIT Tabs  Take 1 tablet by mouth daily.     ferrous sulfate 325 (65 FE) MG tablet  Take 1 tablet (325 mg total) by mouth 3 (three) times daily with meals.     fish oil-omega-3 fatty acids 1000 MG capsule  Take 1 g by mouth daily.     freestyle lancets  Use as instructed     glucose blood test strip  Use as instructed     glucose monitoring kit monitoring kit  1 each by Does not apply route as needed for other.     metFORMIN 500 MG tablet  Commonly known as:  GLUCOPHAGE  Take 500 mg by mouth 2 (two) times daily with  a meal.     multivitamin with minerals Tabs tablet  Take 1 tablet by mouth daily.     omeprazole 40 MG capsule  Commonly known as:  PRILOSEC  Take 40 mg by mouth daily.     oxyCODONE 5 MG immediate release tablet  Commonly known as:  Oxy IR/ROXICODONE  Take 1-2 tablets (5-10 mg total) by mouth every 3 (three) hours as needed for moderate pain.       The patient has been discharged on:   1.Beta Blocker:  Yes [   ]                              No   [ x  ]                              If No, reason:  Bradycardia  2.Ace Inhibitor/ARB: Yes [   ]                                     No  [  x  ]                                     If No, reason:Hypotension  3.Statin:   Yes [   ]                  No  [x   ]                  If No, reason:No CAD  4.Shela CommonsVelta Addison  [ x  ]                  No   [   ]                  If No, reason:      Future Appointments Provider Department Dept Phone   03/24/2014 12:30 PM Chari Manning, NP [place] [phone]      Signed: Ellwood Handler 03/21/2014,  8:52 AM

## 2014-03-21 NOTE — Progress Notes (Signed)
Respiratory assessment ordered. Pt's assessment score was a 1. Per RT protocol assessment, no additional respiratory interventions are indicated at this time. RT will continue to monitor.

## 2014-03-21 NOTE — Discharge Instructions (Signed)
Sternotomy Care After Refer to this sheet in the next few weeks. These instructions provide you with information on caring for yourself after your procedure. Your caregiver may also give you specific instructions. Your treatment has been planned according to current medical practices, but problems sometimes occur. Call your caregiver if you have any problems or questions after your procedure. HOME CARE INSTRUCTIONS   Only take over-the-counter or prescription medicines as directed by your caregiver.  Take your temperature every morning for the first 7 days after surgery. Write these down. Call your caregiver if your temperature stays above 100 F (37.8 C) for more than a day.   Weigh yourself every morning for at least 7 days after surgery. Write your weight down to monitor any weight increase.  Wear elastic stockings during the day for at least 2 weeks after surgery. Use them longer if your ankles are swollen. The stockings help blood flow and help reduce swelling in the legs.  Take frequent naps or rest often throughout the day.  Avoid lifting over 10 lbs (4.5 kg) or pushing or pulling things with your arms for 6 8 weeks or as directed by your caregiver.  Avoid driving or airplane travel for 4 6 weeks after surgery or as directed. If you are riding in a car for an extended period, stop every 1 2 hours to stretch your legs. Keep a record of your medicines and medical history with you when traveling.  Do not cross your legs.  Do not take baths for 4 6 weeks after surgery. Take showers once your caregiver approves. Pat incisions dry. Do not rub incisions with a washcloth or towel.  Avoid climbing stairs and using the handrail to pull yourself up for the first 2 3 weeks after surgery.  Return to work as directed by your caregiver.  Drink enough fluids to keep your urine clear or pale yellow.  Do not strain to have a bowel movement. Eat high-fiber foods if you become constipated. You may  also take a medicine to help you have a bowel movement (laxative) as directed by your caregiver.  Resume sexual activity as directed by your caregiver. Men should not use medicines for erectile dysfunction until their doctor says it isokay.  If you had a certain type of heart condition in the past, you may need to take antibiotic medicine before having dental work or surgery. Let your dentist and caregivers know if you had one or more of the following:  Previous endocarditis.  An artificial (prosthetic) heart valve.  Congenital heart disease. SEEK MEDICAL CARE IF:  You develop a skin rash.   Your weight is increasing each day over 2 3 days, or you have a sudden weight gain.  Your weight increases by 2 or more pounds (1 kg or more) in a single day. SEEK IMMEDIATE MEDICAL CARE IF:   You develop chest pain that is not coming from your incision.   You develop shortness of breath or have difficulty breathing.   You have a fever.   You have increased bleeding from your wounds.   You have increasing wound pain.   You have redness or swelling around your wounds  You have pus coming from your wound.   You develop lightheadedness.  MAKE SURE YOU:   Understand these directions.  Will watch your condition.  Will get help right away if you are not doing well or get worse. Document Released: 05/26/2005 Document Revised: 10/24/2012 Document Reviewed: 08/21/2012 [employer] Patient Information 2014  [employer] ° °

## 2014-03-21 NOTE — Progress Notes (Addendum)
      [address]       [place] [id number]             [phone]      3 Days Post-Op Procedure(s) (LRB): RESECTION OF SUBAORTIC MEMBRANE (N/A)  Subjective:  Jane Arellano complains of stomach discomfort this morning.  She states she needs to go to the bathroom but has been unable.    Objective: Vital signs in last 24 hours: Temp:  [97.8 F (36.6 C)-99.6 F (37.6 C)] 97.8 F (36.6 C) (05/01 0719) Pulse Rate:  [61-80] 62 (05/01 0700) Cardiac Rhythm:  [-] Normal sinus rhythm (05/01 0600) Resp:  [10-22] 15 (05/01 0700) BP: (83-113)/(52-97) 94/58 mmHg (05/01 0700) SpO2:  [91 %-100 %] 95 % (05/01 0700) Arterial Line BP: (88-121)/(51-66) 106/56 mmHg (04/30 1500) Weight:  [186 lb 8.2 oz (84.6 kg)] 186 lb 8.2 oz (84.6 kg) (05/01 0500)  Intake/Output from previous day: 04/30 0701 - 05/01 0700 In: 2882.6 [P.O.:2280; I.V.:382.6; Blood:170; IV Piggyback:50] Out: 2375 [Urine:2375]  General appearance: alert, cooperative and no distress Heart: regular rate and rhythm Lungs: clear to auscultation bilaterally Abdomen: soft, non-tender; bowel sounds normal; no masses,  no organomegaly Extremities: extremities normal, atraumatic, no cyanosis or edema Wound: clean and dry  Lab Results:  Recent Labs  03/19/14 1636 03/20/14 0315  WBC 7.5 8.2  HGB 9.6* 8.6*  HCT 28.3* 25.8*  PLT 104* 103*   BMET:  Recent Labs  03/19/14 0400 03/19/14 1636 03/20/14 0315  NA 142  --  141  K 3.9  --  3.7  CL 107  --  105  CO2 25  --  25  GLUCOSE 117*  --  118*  BUN 6  --  7  CREATININE 0.59 0.50 0.55  CALCIUM 8.6  --  8.5    PT/INR:  Recent Labs  03/18/14 1143  LABPROT 15.9*  INR 1.30   ABG    Component Value Date/Time   PHART 7.350 03/18/2014 1742   HCO3 24.9* 03/18/2014 1742   TCO2 22 03/18/2014 1748   ACIDBASEDEF 1.0 03/18/2014 1742   O2SAT 99.0 03/18/2014 1742   CBG (last 3)   Recent Labs  03/20/14 1534 03/20/14 1919 03/20/14 2146  GLUCAP 144* 135* 132*     Assessment/Plan: S/P Procedure(s) (LRB): RESECTION OF SUBAORTIC MEMBRANE (N/A)  1. CV- Sinus Brady, Hypotension- will continue to hold beta blocker and all antihypertensives 2. Pulm- off oxygen, no acute issues 3. Renal- creatinine WNL, mildly volume overloaded- will continue to hold off on diuresis, with borderline hypotension 4. GI- Constipation, will order lactulose 5. DM- CBGS remain controlled, continue home Metformin 6. Dispo- patient stable, off all drips, remains bradycardic- transfer to 2000   LOS: 3 days    Jane Arellano 03/21/2014   Chart reviewed, patient examined, agree with above. She is doing well. Transfer to 2000. No beta blocker since she is bradycardic at baseline.

## 2014-03-21 NOTE — Progress Notes (Signed)
Pt ambulated to 2W38 on monitor on room air with VSS.  Pt positioned comfortably in bed, left on monitor, central monitoring notified.  Tolerated well.

## 2014-03-22 LAB — GLUCOSE, CAPILLARY
GLUCOSE-CAPILLARY: 117 mg/dL — AB (ref 70–99)
Glucose-Capillary: 118 mg/dL — ABNORMAL HIGH (ref 70–99)
Glucose-Capillary: 121 mg/dL — ABNORMAL HIGH (ref 70–99)
Glucose-Capillary: 105 mg/dL — ABNORMAL HIGH (ref 70–99)

## 2014-03-22 LAB — CBC
HEMATOCRIT: 27.1 % — AB (ref 36.0–46.0)
HEMOGLOBIN: 9 g/dL — AB (ref 12.0–15.0)
MCH: 32.1 pg (ref 26.0–34.0)
MCHC: 33.2 g/dL (ref 30.0–36.0)
MCV: 96.8 fL (ref 78.0–100.0)
Platelets: 136 10*3/uL — ABNORMAL LOW (ref 150–400)
RBC: 2.8 MIL/uL — AB (ref 3.87–5.11)
RDW: 13.1 % (ref 11.5–15.5)
WBC: 6.1 10*3/uL (ref 4.0–10.5)

## 2014-03-22 LAB — BASIC METABOLIC PANEL
BUN: 7 mg/dL (ref 6–23)
CALCIUM: 9.2 mg/dL (ref 8.4–10.5)
CO2: 26 meq/L (ref 19–32)
Chloride: 108 mEq/L (ref 96–112)
Creatinine, Ser: 0.59 mg/dL (ref 0.50–1.10)
GFR calc Af Amer: >90 mL/min (ref >90)
GLUCOSE: 107 mg/dL — AB (ref 70–99)
POTASSIUM: 4 meq/L (ref 3.7–5.3)
Sodium: 144 mEq/L (ref 137–147)

## 2014-03-22 MED ORDER — LACTULOSE 10 GM/15ML PO SOLN
20.0000 g | Freq: Every day | ORAL | Status: DC | PRN
  Administered 2014-03-22: 20 g via ORAL
  Filled 2014-03-22: qty 30

## 2014-03-22 NOTE — Progress Notes (Addendum)
      [address]       [place] [id number]             [phone]      4 Days Post-Op Procedure(s) (LRB): RESECTION OF SUBAORTIC MEMBRANE (N/A)  Subjective:  No new complaints.  Still has been unable to move her bowels  Objective: Vital signs in last 24 hours: Temp:  [98.5 F (36.9 C)-99.5 F (37.5 C)] 99 F (37.2 C) (05/02 0447) Pulse Rate:  [64-67] 64 (05/02 0447) Cardiac Rhythm:  [-] Normal sinus rhythm (05/01 1945) Resp:  [15-18] 18 (05/02 0447) BP: (91-103)/(62-70) 100/67 mmHg (05/02 0447) SpO2:  [94 %-98 %] 94 % (05/02 0447) Weight:  [185 lb 6.5 oz (84.1 kg)] 185 lb 6.5 oz (84.1 kg) (05/02 0447)  Intake/Output from previous day: 05/01 0701 - 05/02 0700 In: 240 [P.O.:240] Out: 650 [Urine:650]  General appearance: alert, cooperative and no distress Heart: regular rate and rhythm Lungs: clear to auscultation bilaterally Abdomen: soft, non-tender; bowel sounds normal; no masses,  no organomegaly Wound: clean and dry  Lab Results:  Recent Labs  03/20/14 0315 03/22/14 0335  WBC 8.2 6.1  HGB 8.6* 9.0*  HCT 25.8* 27.1*  PLT 103* 136*   BMET:  Recent Labs  03/20/14 0315 03/22/14 0335  NA 141 144  K 3.7 4.0  CL 105 108  CO2 25 26  GLUCOSE 118* 107*  BUN 7 7  CREATININE 0.55 0.59  CALCIUM 8.5 9.2    PT/INR: No results found for this basename: LABPROT, INR,  in the last 72 hours ABG    Component Value Date/Time   PHART 7.350 03/18/2014 1742   HCO3 24.9* 03/18/2014 1742   TCO2 22 03/18/2014 1748   ACIDBASEDEF 1.0 03/18/2014 1742   O2SAT 99.0 03/18/2014 1742   CBG (last 3)   Recent Labs  03/21/14 1616 03/21/14 2118 03/22/14 0632  GLUCAP 138* 116* 118*    Assessment/Plan: S/P Procedure(s) (LRB): RESECTION OF SUBAORTIC MEMBRANE (N/A)  1. CV- Sinus Brady,  Borderline Hypotension- continue to hold Beta Blockade and antihypertensive agents 2. Pulm- no acute issues off oxygen 3. Renal- creatinine WNL, minimal volume overloaded,  no diuresis at this time 4. DM- CBGS remain controlled 5. Dispo- patient doing well, will order lactulose for bowels, d/c EPW today, likely home in AM if remains stable   LOS: 4 days    Jane Arellano 03/22/2014  Patient seen and examined, agree with above

## 2014-03-22 NOTE — Progress Notes (Signed)
Epicardial wires pulled per order. VSS. Patient without complaint. Will continue to monitor.

## 2014-03-22 NOTE — Progress Notes (Signed)
CARDIAC REHAB PHASE I   PRE:  Rate/Rhythm: 60 sR    BP: sitting 110/62    SaO2: 96 RA  MODE:  Ambulation: 890 ft   POST:  Rate/Rhythm: 91 SR    BP: sitting 104/70     SaO2: 96 RA  Tolerated very well, moving independently. No c/o. Ed completed with pt. She is interested in [facility] and will send referral to [facility]. Gave financial aid application. [phone]   Megan Salonandi K Jamira Barfuss CES, ACSM 03/22/2014 9:39 AM

## 2014-03-23 LAB — GLUCOSE, CAPILLARY
Glucose-Capillary: 96 mg/dL (ref 70–99)
Glucose-Capillary: 140 mg/dL — ABNORMAL HIGH (ref 70–99)

## 2014-03-23 MED ORDER — ASPIRIN 325 MG PO TBEC: 325.0000 mg | DELAYED_RELEASE_TABLET | Freq: Every day | ORAL | Status: DC

## 2014-03-23 MED ORDER — OXYCODONE HCL 5 MG PO TABS: 5.0000 mg | ORAL_TABLET | ORAL | Status: DC | PRN

## 2014-03-23 NOTE — Progress Notes (Addendum)
      [address]       [place] [id number]             [phone]      5 Days Post-Op Procedure(s) (LRB): RESECTION OF SUBAORTIC MEMBRANE (N/A)  Subjective:  Jane Arellano has no complaints.  She states she feels great and is ready to go home.  Objective: Vital signs in last 24 hours: Temp:  [97.7 F (36.5 C)-99.1 F (37.3 C)] 98.4 F (36.9 C) (05/03 0357) Pulse Rate:  [58-74] 68 (05/03 0357) Cardiac Rhythm:  [-] Normal sinus rhythm (05/03 0357) Resp:  [18-19] 18 (05/02 2030) BP: (103-131)/(61-85) 131/85 mmHg (05/03 0357) SpO2:  [96 %-100 %] 98 % (05/03 0357) Weight:  [185 lb 3 oz (84 kg)] 185 lb 3 oz (84 kg) (05/03 0357)  Intake/Output from previous day: 05/02 0701 - 05/03 0700 In: 360 [P.O.:360] Out: -   General appearance: alert, cooperative and no distress Heart: regular rate and rhythm Lungs: clear to auscultation bilaterally Abdomen: soft, non-tender; bowel sounds normal; no masses,  no organomegaly Extremities: extremities normal, atraumatic, no cyanosis or edema Wound: clean and dry  Lab Results:  Recent Labs  03/22/14 0335  WBC 6.1  HGB 9.0*  HCT 27.1*  PLT 136*   BMET:  Recent Labs  03/22/14 0335  NA 144  K 4.0  CL 108  CO2 26  GLUCOSE 107*  BUN 7  CREATININE 0.59  CALCIUM 9.2    PT/INR: No results found for this basename: LABPROT, INR,  in the last 72 hours ABG    Component Value Date/Time   PHART 7.350 03/18/2014 1742   HCO3 24.9* 03/18/2014 1742   TCO2 22 03/18/2014 1748   ACIDBASEDEF 1.0 03/18/2014 1742   O2SAT 99.0 03/18/2014 1742   CBG (last 3)   Recent Labs  03/22/14 1613 03/22/14 2102 03/23/14 0622  GLUCAP 121* 105* 96    Assessment/Plan: S/P Procedure(s) (LRB): RESECTION OF SUBAORTIC MEMBRANE (N/A)  1. CV- Sinus Brady, hypotension- continue to hold Beta Blockers and Anti-hypertensive agents 2. Pulm- no acute issues, encouraged use of IS at discharge 3. DM- CBGs controlled 4. Dispo- patient doing  well, will d/c home today   LOS: 5 days    Jane Arellano 03/23/2014   Chart reviewed, patient examined, agree with above. She has expected acute blood loss anemia that is stable.

## 2014-03-24 ENCOUNTER — Ambulatory Visit: Payer: No Typology Code available for payment source | Admitting: Internal Medicine

## 2014-03-25 ENCOUNTER — Ambulatory Visit: Payer: No Typology Code available for payment source | Admitting: Internal Medicine

## 2014-03-28 ENCOUNTER — Other Ambulatory Visit: Payer: Self-pay | Admitting: *Deleted

## 2014-03-28 DIAGNOSIS — G8918 Other acute postprocedural pain: Secondary | ICD-10-CM

## 2014-03-28 MED ORDER — OXYCODONE HCL 5 MG PO TABS: 5.0000 mg | ORAL_TABLET | ORAL | Status: DC | PRN

## 2014-03-31 ENCOUNTER — Ambulatory Visit: Payer: No Typology Code available for payment source | Attending: Internal Medicine | Admitting: Internal Medicine

## 2014-03-31 ENCOUNTER — Encounter: Payer: Self-pay | Admitting: Internal Medicine

## 2014-03-31 VITALS — BP 128/81 | HR 68 | Temp 98.0°F | Resp 18 | Ht 66.5 in | Wt 181.8 lb

## 2014-03-31 DIAGNOSIS — D509 Iron deficiency anemia, unspecified: Secondary | ICD-10-CM | POA: Insufficient documentation

## 2014-03-31 DIAGNOSIS — E119 Type 2 diabetes mellitus without complications: Secondary | ICD-10-CM | POA: Insufficient documentation

## 2014-03-31 DIAGNOSIS — I4891 Unspecified atrial fibrillation: Secondary | ICD-10-CM | POA: Insufficient documentation

## 2014-03-31 DIAGNOSIS — K5900 Constipation, unspecified: Secondary | ICD-10-CM

## 2014-03-31 DIAGNOSIS — K219 Gastro-esophageal reflux disease without esophagitis: Secondary | ICD-10-CM | POA: Insufficient documentation

## 2014-03-31 DIAGNOSIS — Z7901 Long term (current) use of anticoagulants: Secondary | ICD-10-CM | POA: Insufficient documentation

## 2014-03-31 DIAGNOSIS — Z0000 Encounter for general adult medical examination without abnormal findings: Secondary | ICD-10-CM

## 2014-03-31 LAB — GLUCOSE, POCT (MANUAL RESULT ENTRY): POC GLUCOSE: 139 mg/dL — AB (ref 70–99)

## 2014-03-31 LAB — POCT GLYCOSYLATED HEMOGLOBIN (HGB A1C): Hemoglobin A1C: 5.9

## 2014-03-31 MED ORDER — POLYETHYLENE GLYCOL 3350 17 GM/SCOOP PO POWD: 17.0000 g | Freq: Every day | ORAL | Status: DC

## 2014-03-31 NOTE — Progress Notes (Signed)
Patient ID: Jane Arellano, female   DOB: 02/10/1961, 53 y.o.   MRN: [id number]  CC: Hospital followup, constipation  HPI: She presents today as a hospital followup for an aortic valve repair that she had on April 28. She is currently following up with cardiology. She reports that she has had constipation since discharge from the hospital. She reports no bowel movement and a total of 4 days with abdominal distention. She reports she is taking oxycodone for pain and believes this is contributing to her constipation.  Allergies  Allergen Reactions  . Ketorolac Tromethamine Other (See Comments)    Unknown reaction   Past Medical History  Diagnosis Date  . Atypical chest pain     a. 11/2005 Negative Myoview  . Subaortic membrane     a. 01/2010 Echo: EF 55-60%, No rwma, subaortic membrane with elevated LVOT mean gradient of 21 mmHg, Triv AI, mod dil LA, mildly increased PASP. b. 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 GERD (gastroesophageal reflux disease)   . HTN (hypertension)   . Iron deficiency anemia   . History of substance abuse   . Paroxysmal atrial fibrillation 05/01/2013    On Xarelto  . Diastolic dysfunction   . Heart murmur   . DM2 (diabetes mellitus, type 2)   . Dysrhythmia     AFIB HX CV    Current Outpatient Prescriptions on File Prior to Visit  Medication Sig Dispense Refill  . acetaminophen (TYLENOL) 500 MG tablet Take 1,000 mg by mouth every 6 (six) hours as needed for mild pain.      Marland Kitchen antiseptic oral rinse (BIOTENE) LIQD 15 mLs by Mouth Rinse route 2 (two) times daily as needed for dry mouth.       Marland Kitchen aspirin EC 325 MG EC tablet Take 1 tablet (325 mg total) by mouth daily.  30 tablet  0  . Calcium Carb-Cholecalciferol (CALCIUM-VITAMIN D3) 500-400 MG-UNIT TABS Take 1 tablet by mouth daily.      .  ferrous sulfate 325 (65 FE) MG tablet Take 1 tablet (325 mg total) by mouth 3 (three) times daily with meals.      . fish oil-omega-3 fatty acids 1000 MG capsule Take 1 g by mouth daily.      Marland Kitchen glucose blood test strip Use as instructed  100 each  12  . glucose monitoring kit (FREESTYLE) monitoring kit 1 each by Does not apply route as needed for other.  1 each  0  . Lancets (FREESTYLE) lancets Use as instructed  100 each  12  . metFORMIN (GLUCOPHAGE) 500 MG tablet Take 500 mg by mouth 2 (two) times daily with a meal.      . Multiple Vitamin (MULTIVITAMIN WITH MINERALS) TABS tablet Take 1 tablet by mouth daily.      Marland Kitchen omeprazole (PRILOSEC) 40 MG capsule Take 40 mg by mouth daily.      Marland Kitchen oxyCODONE (OXY IR/ROXICODONE) 5 MG immediate release tablet Take 1-2 tablets (5-10 mg total) by mouth every 3 (three) hours as needed for moderate pain.  40 tablet  0  . [DISCONTINUED] potassium chloride (K-DUR,KLOR-CON) 10 MEQ tablet Take 1 tablet (10 mEq total) by mouth daily.  30 tablet  0   No current facility-administered medications on file prior to visit.   Family History  Problem Relation Age of Onset  .  Hypertension Sister     alive and well  . Diabetes Father     deceased  . Cancer Mother     deceased @ 71  . Stroke Mother   . Hypertension Sister     alive and well  . Heart attack Brother     deceased @ 20   History   Social History  . Marital Status: Single    Spouse Name: N/A    Number of Children: N/A  . Years of Education: N/A   Occupational History  . Not on file.   Social History Main Topics  . Smoking status: Never Smoker   . Smokeless tobacco: Current User    Types: Snuff  . Alcohol Use: Yes     Comment: Drinks 3 - 40 oz beers daily.  i QUIT DOING THAT "  . Drug Use: Yes    Special: Marijuana     Comment: Smokes marijuana weekly.  Has not used cocaine in 9 years.  . Sexual Activity: Not on file   Other Topics Concern  . Not on file   Social History Narrative    Lives in [place] by herself.  She had been caring for her mother but she died 2 mos ago.  She tries to remain active but does not regularly exercise.    Review of Systems  Constitutional: Negative for fever and chills.  HENT: Negative.   Eyes: Negative.   Respiratory: Negative for cough and shortness of breath.   Cardiovascular: Negative for chest pain, palpitations and leg swelling.  Gastrointestinal: Positive for abdominal pain and constipation.  Genitourinary: Negative.   Musculoskeletal: Negative.   Skin: Negative.   Neurological: Negative.   Endo/Heme/Allergies: Negative.   Psychiatric/Behavioral: Negative.      Objective:   Filed Vitals:   03/31/14 0930  BP: 128/81  Pulse: 68  Temp: 98 F (36.7 C)  Resp: 18    Physical Exam: Constitutional: Patient appears well-developed and well-nourished. No distress. Eyes: Conjunctivae and EOM are normal. PERRLA, no scleral icterus. Neck: Normal ROM. Neck supple.  CVS: RRR, S1/S2 +, no murmurs, no gallops, no carotid bruit.  Pulmonary: Effort and breath sounds normal, no stridor, rhonchi, wheezes, rales.  Abdominal: Sluggish bowel sounds,  Mild abdominal distention with no tenderness  Lymphadenopathy: No lymphadenopathy noted, cervical Neuro: Alert. muscle tone coordination.  Skin: Skin is warm and dry. Appropriately healed midline thoracic incision was mild tenderness  Psychiatric: Normal mood and affect. Behavior, judgment, thought content normal.  Lab Results  Component Value Date   WBC 6.1 03/22/2014   HGB 9.0* 03/22/2014   HCT 27.1* 03/22/2014   MCV 96.8 03/22/2014   PLT 136* 03/22/2014   Lab Results  Component Value Date   CREATININE 0.59 03/22/2014   BUN 7 03/22/2014   NA 144 03/22/2014   K 4.0 03/22/2014   CL 108 03/22/2014   CO2 26 03/22/2014    Lab Results  Component Value Date   HGBA1C 5.9 03/31/2014   Lipid Panel     Component Value Date/Time   CHOL 157 05/02/2013 0535   TRIG 131 05/02/2013 0535   HDL 67 05/02/2013 0535    CHOLHDL 2.3 05/02/2013 0535   VLDL 26 05/02/2013 0535   LDLCALC 64 05/02/2013 0535       Assessment and plan:   Jane Arellano was seen today for hospitalization follow-up, constipation and diabetes.  Diagnoses and associated orders for this visit:  Unspecified constipation. - polyethylene glycol powder (GLYCOLAX/MIRALAX) powder; Take 17 g  by mouth daily. May use colace once daily as well  Preventative health care - Ambulatory referral to Gastroenterology--colonoscopy  Diabetes - Glucose (CBG) - HgB A1c Continue current diabetes treatment, patient's hemoglobin A1c is 5.9. Explained to patient she is doing very well with diabetes management.  Return in about 3 months (around 07/01/2014) for DM/HTN and pap smear.      Chari Manning, NP-C [facility] [phone] 03/31/2014, 10:31 AM

## 2014-03-31 NOTE — Patient Instructions (Signed)
Polyethylene Glycol powder What is this medicine? POLYETHYLENE GLYCOL 3350 (pol ee ETH i leen; GLYE col) powder is a laxative used to treat constipation. It increases the amount of water in the stool. Bowel movements become easier and more frequent. This medicine may be used for other purposes; ask your health care provider or pharmacist if you have questions. COMMON BRAND NAME(S): GaviLax, GlycoLax, MiraLax, [employer]  What should I tell my health care provider before I take this medicine? They need to know if you have any of these conditions: -a history of blockage of the stomach or intestine -current abdomen distension or pain -difficulty swallowing -diverticulitis, ulcerative colitis, or other chronic bowel disease -phenylketonuria -an unusual or allergic reaction to polyethylene glycol, other medicines, dyes, or preservatives -pregnant or trying to get pregnant -breast-feeding How should I use this medicine? Take this medicine by mouth. The bottle has a measuring cap that is marked with a line. Pour the powder into the cap up to the marked line (the dose is about 1 heaping tablespoon). Add the powder in the cap to a full glass (4 to 8 ounces or 120 to 240 ml) of water, juice, soda, coffee or tea. Mix the powder well. Drink the solution. Take exactly as directed. Do not take your medicine more often than directed. Talk to your pediatrician regarding the use of this medicine in children. Special care may be needed. Overdosage: If you think you have taken too much of this medicine contact a poison control center or emergency room at once. NOTE: This medicine is only for you. Do not share this medicine with others. What if I miss a dose? If you miss a dose, take it as soon as you can. If it is almost time for your next dose, take only that dose. Do not take double or extra doses. What may interact with this medicine? Interactions are not expected. This list may not describe all possible  interactions. Give your health care provider a list of all the medicines, herbs, non-prescription drugs, or dietary supplements you use. Also tell them if you smoke, drink alcohol, or use illegal drugs. Some items may interact with your medicine. What should I watch for while using this medicine? Do not use for more than 2 weeks without advice from your doctor or health care professional. It can take 2 to 4 days to have a bowel movement and to experience improvement in constipation. See your health care professional for any changes in bowel habits, including constipation, that are severe or last longer than three weeks. Always take this medicine with plenty of water. What side effects may I notice from receiving this medicine? Side effects that you should report to your doctor or health care professional as soon as possible: -diarrhea -difficulty breathing -itching of the skin, hives, or skin rash -severe bloating, pain, or distension of the stomach -vomiting Side effects that usually do not require medical attention (report to your doctor or health care professional if they continue or are bothersome): -bloating or gas -lower abdominal discomfort or cramps -nausea This list may not describe all possible side effects. Call your doctor for medical advice about side effects. You may report side effects to FDA at 1-800-FDA-1088. Where should I keep my medicine? Keep out of the reach of children. Store between 15 and 30 degrees C (59 and 86 degrees F). Throw away any unused medicine after the expiration date. NOTE: This sheet is a summary. It may not cover all possible information.  If you have questions about this medicine, talk to your doctor, pharmacist, or health care provider.  2014, Elsevier/Gold Standard. (2008-06-09 16:50:45) Constipation, Adult Constipation is when a person has fewer than 3 bowel movements a week; has difficulty having a bowel movement; or has stools that are dry, hard, or  larger than normal. As people grow older, constipation is more common. If you try to fix constipation with medicines that make you have a bowel movement (laxatives), the problem may get worse. Long-term laxative use may cause the muscles of the colon to become weak. A low-fiber diet, not taking in enough fluids, and taking certain medicines may make constipation worse. CAUSES   Certain medicines, such as antidepressants, pain medicine, iron supplements, antacids, and water pills.   Certain diseases, such as diabetes, irritable bowel syndrome (IBS), thyroid disease, or depression.   Not drinking enough water.   Not eating enough fiber-rich foods.   Stress or travel.  Lack of physical activity or exercise.  Not going to the restroom when there is the urge to have a bowel movement.  Ignoring the urge to have a bowel movement.  Using laxatives too much. SYMPTOMS   Having fewer than 3 bowel movements a week.   Straining to have a bowel movement.   Having hard, dry, or larger than normal stools.   Feeling full or bloated.   Pain in the lower abdomen.  Not feeling relief after having a bowel movement. DIAGNOSIS  Your caregiver will take a medical history and perform a physical exam. Further testing may be done for severe constipation. Some tests may include:   A barium enema X-ray to examine your rectum, colon, and sometimes, your small intestine.  A sigmoidoscopy to examine your lower colon.  A colonoscopy to examine your entire colon. TREATMENT  Treatment will depend on the severity of your constipation and what is causing it. Some dietary treatments include drinking more fluids and eating more fiber-rich foods. Lifestyle treatments may include regular exercise. If these diet and lifestyle recommendations do not help, your caregiver may recommend taking over-the-counter laxative medicines to help you have bowel movements. Prescription medicines may be prescribed if  over-the-counter medicines do not work.  HOME CARE INSTRUCTIONS   Increase dietary fiber in your diet, such as fruits, vegetables, whole grains, and beans. Limit high-fat and processed sugars in your diet, such as Jamaica[place] fries, hamburgers, cookies, candies, and soda.   A fiber supplement may be added to your diet if you cannot get enough fiber from foods.   Drink enough fluids to keep your urine clear or pale yellow.   Exercise regularly or as directed by your caregiver.   Go to the restroom when you have the urge to go. Do not hold it.  Only take medicines as directed by your caregiver. Do not take other medicines for constipation without talking to your caregiver first. SEEK IMMEDIATE MEDICAL CARE IF:   You have bright red blood in your stool.   Your constipation lasts for more than 4 days or gets worse.   You have abdominal or rectal pain.   You have thin, pencil-like stools.  You have unexplained weight loss. MAKE SURE YOU:   Understand these instructions.  Will watch your condition.  Will get help right away if you are not doing well or get worse. Document Released: 08/05/2004 Document Revised: 01/30/2012 Document Reviewed: 08/19/2013 [facility] Patient Information 2014 [place], [place].

## 2014-03-31 NOTE — Progress Notes (Signed)
HFU for aortic valve surgery Patient states she has not had a bowel movement in 4 days.

## 2014-04-03 ENCOUNTER — Other Ambulatory Visit: Payer: Self-pay

## 2014-04-03 DIAGNOSIS — G8918 Other acute postprocedural pain: Secondary | ICD-10-CM

## 2014-04-03 MED ORDER — OXYCODONE HCL 5 MG PO TABS: 5.0000 mg | ORAL_TABLET | ORAL | Status: DC | PRN

## 2014-04-03 NOTE — Telephone Encounter (Signed)
RX refill for Oxycodone 5 mg 1-2 tabs po every 6 hours prn pain #40 no refills left at front desk for pt to pick up after 1500 today.

## 2014-04-10 ENCOUNTER — Other Ambulatory Visit: Payer: Self-pay

## 2014-04-10 DIAGNOSIS — G8918 Other acute postprocedural pain: Secondary | ICD-10-CM

## 2014-04-10 MED ORDER — OXYCODONE HCL 5 MG PO TABS: 5.0000 mg | ORAL_TABLET | Freq: Four times a day (QID) | ORAL | Status: DC | PRN

## 2014-04-10 NOTE — Telephone Encounter (Signed)
RX refill for oxycodone 5 mg 1 tab po every 6 hours prn pain printed out and Dr Tyrone SageGerhardt signed. Patient will up at front desk.

## 2014-04-11 ENCOUNTER — Telehealth: Payer: Self-pay | Admitting: Internal Medicine

## 2014-04-11 NOTE — Telephone Encounter (Signed)
Walk in Pt Form" [facility]" Form Dropped off Sherri back Tuesday Will give to Her Then 5.22.15/km

## 2014-04-18 ENCOUNTER — Other Ambulatory Visit: Payer: Self-pay | Admitting: *Deleted

## 2014-04-18 DIAGNOSIS — G8918 Other acute postprocedural pain: Secondary | ICD-10-CM

## 2014-04-18 MED ORDER — OXYCODONE HCL 5 MG PO TABS: 5.0000 mg | ORAL_TABLET | Freq: Four times a day (QID) | ORAL | Status: DC | PRN

## 2014-04-21 ENCOUNTER — Other Ambulatory Visit: Payer: Self-pay | Admitting: Internal Medicine

## 2014-04-22 ENCOUNTER — Other Ambulatory Visit: Payer: Self-pay | Admitting: Surgery

## 2014-04-22 DIAGNOSIS — Q244 Congenital subaortic stenosis: Secondary | ICD-10-CM

## 2014-04-23 ENCOUNTER — Ambulatory Visit
Admission: RE | Admit: 2014-04-23 | Discharge: 2014-04-23 | Disposition: A | Discharge status: Home / Self Care | Payer: No Typology Code available for payment source | Source: Ambulatory Visit | Attending: Surgery | Admitting: Surgery

## 2014-04-23 ENCOUNTER — Ambulatory Visit (INDEPENDENT_AMBULATORY_CARE_PROVIDER_SITE_OTHER): Payer: Self-pay | Admitting: Surgery

## 2014-04-23 ENCOUNTER — Telehealth: Payer: Self-pay | Admitting: Internal Medicine

## 2014-04-23 ENCOUNTER — Encounter: Payer: Self-pay | Admitting: Surgery

## 2014-04-23 VITALS — BP 121/78 | HR 66 | Resp 20 | Ht 66.5 in | Wt 181.0 lb

## 2014-04-23 DIAGNOSIS — E876 Hypokalemia: Secondary | ICD-10-CM

## 2014-04-23 DIAGNOSIS — Z09 Encounter for follow-up examination after completed treatment for conditions other than malignant neoplasm: Secondary | ICD-10-CM

## 2014-04-23 DIAGNOSIS — Z8679 Personal history of other diseases of the circulatory system: Secondary | ICD-10-CM

## 2014-04-23 DIAGNOSIS — K219 Gastro-esophageal reflux disease without esophagitis: Secondary | ICD-10-CM

## 2014-04-23 DIAGNOSIS — Q244 Congenital subaortic stenosis: Secondary | ICD-10-CM

## 2014-04-23 DIAGNOSIS — I359 Nonrheumatic aortic valve disorder, unspecified: Secondary | ICD-10-CM

## 2014-04-23 MED ORDER — OMEPRAZOLE 40 MG PO CPDR: 40.0000 mg | DELAYED_RELEASE_CAPSULE | Freq: Every day | ORAL | Status: DC

## 2014-04-23 NOTE — Progress Notes (Signed)
HPI:  Patient returns for routine postoperative follow-up having undergone resection of a subaortic membrane causing severe subaortic stenosis on 03/18/2014. The patient's early postoperative recovery while in the hospital was notable for an uncomplicated postop course. Since hospital discharge the patient reports that she has felt much better than preop and is walking with no chest pain or shortness of breath.   Current Outpatient Prescriptions  Medication Sig Dispense Refill  . acetaminophen (TYLENOL) 500 MG tablet Take 1,000 mg by mouth every 6 (six) hours as needed for mild pain.      Marland Kitchen amLODipine (NORVASC) 10 MG tablet Take 10 mg by mouth daily.      Marland Kitchen antiseptic oral rinse (BIOTENE) LIQD 15 mLs by Mouth Rinse route 2 (two) times daily as needed for dry mouth.       Marland Kitchen aspirin EC 325 MG EC tablet Take 1 tablet (325 mg total) by mouth daily.  30 tablet  0  . Calcium Carb-Cholecalciferol (CALCIUM-VITAMIN D3) 500-400 MG-UNIT TABS Take 1 tablet by mouth daily.      . carvedilol (COREG) 6.25 MG tablet Take 6.25 mg by mouth 2 (two) times daily with a meal.      . ferrous sulfate 325 (65 FE) MG tablet Take 1 tablet (325 mg total) by mouth 3 (three) times daily with meals.      . fish oil-omega-3 fatty acids 1000 MG capsule Take 1 g by mouth daily.      . furosemide (LASIX) 20 MG tablet Take 20 mg by mouth daily.      Marland Kitchen glucose blood test strip Use as instructed  100 each  12  . glucose monitoring kit (FREESTYLE) monitoring kit 1 each by Does not apply route as needed for other.  1 each  0  . Lancets (FREESTYLE) lancets Use as instructed  100 each  12  . metFORMIN (GLUCOPHAGE) 500 MG tablet Take 500 mg by mouth 2 (two) times daily with a meal.      . Multiple Vitamin (MULTIVITAMIN WITH MINERALS) TABS tablet Take 1 tablet by mouth daily.      Marland Kitchen oxyCODONE (OXY IR/ROXICODONE) 5 MG immediate release tablet Take 1 tablet (5 mg total) by mouth every 6 (six) hours as needed for moderate pain.  40  tablet  0  . polyethylene glycol powder (GLYCOLAX/MIRALAX) powder Take 17 g by mouth daily.  850 g  1  . potassium chloride (MICRO-K) 10 MEQ CR capsule Take 10 mEq by mouth daily.      Marland Kitchen omeprazole (PRILOSEC) 40 MG capsule Take 1 capsule (40 mg total) by mouth daily.  30 capsule  2  . [DISCONTINUED] potassium chloride (K-DUR,KLOR-CON) 10 MEQ tablet Take 1 tablet (10 mEq total) by mouth daily.  30 tablet  0   No current facility-administered medications for this visit.    Physical Exam: BP 121/78  Pulse 66  Resp 20  Ht 5' 6.5" (1.689 m)  Wt 181 lb (82.101 kg)  BMI 28.78 kg/m2  SpO2 99% She looks well Lungs are clear Cardiac exam shows a regular rate and rhythm with normal heart sounds. The chest incision is healing well and the sternum is stable. There is no peripheral edema  Diagnostic Tests:  CLINICAL DATA: Resection of sub aortic membrane, followup  EXAM:  CHEST 2 VIEW  COMPARISON: Portable chest x-ray of 03/20/2014  FINDINGS:  Aeration of the lungs has improved significantly with resolution of  small effusions and basilar atelectasis. No pneumothorax is  seen.  Mild cardiomegaly is stable. Median sternotomy sutures are again  noted.  IMPRESSION:  No active lung disease. Resolution of effusions and basilar  atelectasis.  Electronically Signed  By: Ivar Drape M.D.  On: 04/23/2014 09:12   Impression:  Overall I think she is doing well. I encouraged her to continue walking. She should not lift anything heavier than 10 lbs for three months postop. She does not drive   Plan:  She will follow-up with her primary physician and cardiology and will contact me if she develops any problems with her incision.

## 2014-04-23 NOTE — Telephone Encounter (Signed)
Omeprazole prescription refilled per protocol. Patient potassium is 4.0 on 03/22/2014. Informed patient will have asked PCP if its ok to refill Potassium. Reather Laurence, RN

## 2014-04-23 NOTE — Telephone Encounter (Signed)
Pt has come in today to pick up medications and asked if she can get a call from the nurse about some additional medications she needs filled; pt is asking for omeprazole (PRILOSEC) 40 MG capsule and potassium chloride (MICRO-K) 10 MEQ CR capsule; please f/u with pt at your earliest convenience

## 2014-04-28 ENCOUNTER — Other Ambulatory Visit: Payer: Self-pay

## 2014-04-28 DIAGNOSIS — G8918 Other acute postprocedural pain: Secondary | ICD-10-CM

## 2014-04-28 MED ORDER — OXYCODONE HCL 5 MG PO TABS: 5.0000 mg | ORAL_TABLET | Freq: Two times a day (BID) | ORAL | Status: DC | PRN

## 2014-04-28 NOTE — Telephone Encounter (Signed)
RX printed for pain med. Oxycodone 5 mg 1 every 12 hours PRN ONLY # 40 no refills

## 2014-05-01 ENCOUNTER — Telehealth: Payer: Self-pay | Admitting: *Deleted

## 2014-05-01 MED ORDER — DABIGATRAN ETEXILATE MESYLATE 75 MG PO CAPS: 75.0000 mg | ORAL_CAPSULE | Freq: Two times a day (BID) | ORAL | Status: DC

## 2014-05-01 NOTE — Telephone Encounter (Signed)
Per Dr Oneal Grout Samuella Cota, patient to continue pradaxa 150 mg bid until further notice.  Patient gets her pradaxa from  [facility] [phone] FAX

## 2014-05-02 ENCOUNTER — Encounter: Payer: Self-pay | Admitting: Internal Medicine

## 2014-05-15 ENCOUNTER — Other Ambulatory Visit: Payer: Self-pay | Admitting: Internal Medicine

## 2014-05-16 ENCOUNTER — Telehealth: Payer: Self-pay | Admitting: Internal Medicine

## 2014-05-16 NOTE — Telephone Encounter (Signed)
Pt. Needs refill for carvedilol (COREG) 6.25 MG tablet,amLODipine (NORVASC) 10 MG tablet , please forward refills to our pharmacy...please call patient when refills are ready

## 2014-05-19 ENCOUNTER — Telehealth: Payer: Self-pay | Admitting: *Deleted

## 2014-05-19 NOTE — Telephone Encounter (Signed)
Message copied by Carmela HurtADAMS, KIMBERLY G on Mon May 19, 2014  2:07 PM ------      Message from: Duke SalviaKLEIN, STEVEN C      Created: Mon May 19, 2014 10:03 AM      Regarding: RE: QUESTION ABOUT PATIENTS PRADAXA       i will be seeing her next month but she should prob stil be on it       steve      ----- Message -----         From: Carmela HurtKimberly G Adams, RN         Sent: 04/29/2014  10:14 AM           To: Baird LyonsSherri L Price, RN, Duke SalviaSteven C Klein, MD      Subject: QUESTION ABOUT PATIENTS PRADAXA                          Dr Graciela HusbandsKlein, [facility], It looks like the pradaxa was discontinued on hospital record dated 5/1/2015Discharge Medications:         STOP taking these medications        --amLODipine 10 MG tablet        -- carvedilol 6.25 MG tablet        -- dabigatran 150 MG Caps capsule        --furosemide 20 MG tablet        -- potassium chloride 10 MEQ tablet                      PATIENT IS TAKING THIS MEDICATION, should she be on it?      Thanks, Addison LankKim Adams, RN                           ------

## 2014-05-19 NOTE — Telephone Encounter (Signed)
Attempted both phone numbers without success, per prior note patient states she did continue the pradaxa. Left message with contact person.

## 2014-05-20 ENCOUNTER — Other Ambulatory Visit: Payer: Self-pay | Admitting: Internal Medicine

## 2014-05-21 ENCOUNTER — Other Ambulatory Visit: Payer: Self-pay

## 2014-05-21 DIAGNOSIS — G8918 Other acute postprocedural pain: Secondary | ICD-10-CM

## 2014-05-21 MED ORDER — TRAMADOL HCL 50 MG PO TABS: 50.0000 mg | ORAL_TABLET | Freq: Two times a day (BID) | ORAL | Status: DC | PRN

## 2014-05-21 NOTE — Telephone Encounter (Signed)
RX for Tramadol was faxed to [facility]'s [facility]. Oxycodone was d/c'ed

## 2014-05-26 ENCOUNTER — Telehealth: Payer: Self-pay | Admitting: *Deleted

## 2014-05-26 ENCOUNTER — Other Ambulatory Visit: Payer: Self-pay | Admitting: *Deleted

## 2014-05-26 DIAGNOSIS — E1065 Type 1 diabetes mellitus with hyperglycemia: Secondary | ICD-10-CM

## 2014-05-26 DIAGNOSIS — I5189 Other ill-defined heart diseases: Secondary | ICD-10-CM

## 2014-05-26 DIAGNOSIS — E108 Type 1 diabetes mellitus with unspecified complications: Secondary | ICD-10-CM

## 2014-05-26 DIAGNOSIS — I10 Essential (primary) hypertension: Secondary | ICD-10-CM

## 2014-05-26 DIAGNOSIS — IMO0002 Reserved for concepts with insufficient information to code with codable children: Secondary | ICD-10-CM

## 2014-05-26 MED ORDER — METFORMIN HCL 500 MG PO TABS: 500.0000 mg | ORAL_TABLET | Freq: Two times a day (BID) | ORAL | Status: DC

## 2014-05-26 MED ORDER — CARVEDILOL 6.25 MG PO TABS: 6.2500 mg | ORAL_TABLET | Freq: Two times a day (BID) | ORAL | Status: DC

## 2014-05-26 MED ORDER — AMLODIPINE BESYLATE 10 MG PO TABS: 10.0000 mg | ORAL_TABLET | Freq: Every day | ORAL | Status: DC

## 2014-05-26 NOTE — Telephone Encounter (Signed)
Left message to return my call.  

## 2014-05-29 ENCOUNTER — Telehealth: Payer: Self-pay | Admitting: *Deleted

## 2014-05-29 NOTE — Telephone Encounter (Signed)
Sherri, just wanted to verify with you the pradaxa dose for this patient. Should it be 150mg  bid? Please advise. Thanks, MI

## 2014-05-29 NOTE — Telephone Encounter (Signed)
Yes Jane Arellano, this is the proper dosage for patient.

## 2014-05-30 ENCOUNTER — Other Ambulatory Visit: Payer: Self-pay | Admitting: Internal Medicine

## 2014-05-30 DIAGNOSIS — E1065 Type 1 diabetes mellitus with hyperglycemia: Secondary | ICD-10-CM

## 2014-05-30 DIAGNOSIS — IMO0002 Reserved for concepts with insufficient information to code with codable children: Secondary | ICD-10-CM

## 2014-05-30 DIAGNOSIS — E108 Type 1 diabetes mellitus with unspecified complications: Principal | ICD-10-CM

## 2014-06-06 ENCOUNTER — Ambulatory Visit: Payer: No Typology Code available for payment source | Admitting: Internal Medicine

## 2014-06-17 ENCOUNTER — Telehealth: Payer: Self-pay | Admitting: Internal Medicine

## 2014-06-17 ENCOUNTER — Ambulatory Visit: Payer: No Typology Code available for payment source | Admitting: Internal Medicine

## 2014-06-17 NOTE — Telephone Encounter (Signed)
Please document in record as same-day cancellation. Thank you

## 2014-06-20 IMAGING — DX DG CHEST 1V PORT
1 series · 1 of 1 positions shown · non-contrast
Comparison: DG CHEST 2 VIEW dated 03/14/2014

CLINICAL DATA: postop

EXAM:
PORTABLE CHEST - 1 VIEW

[portable]
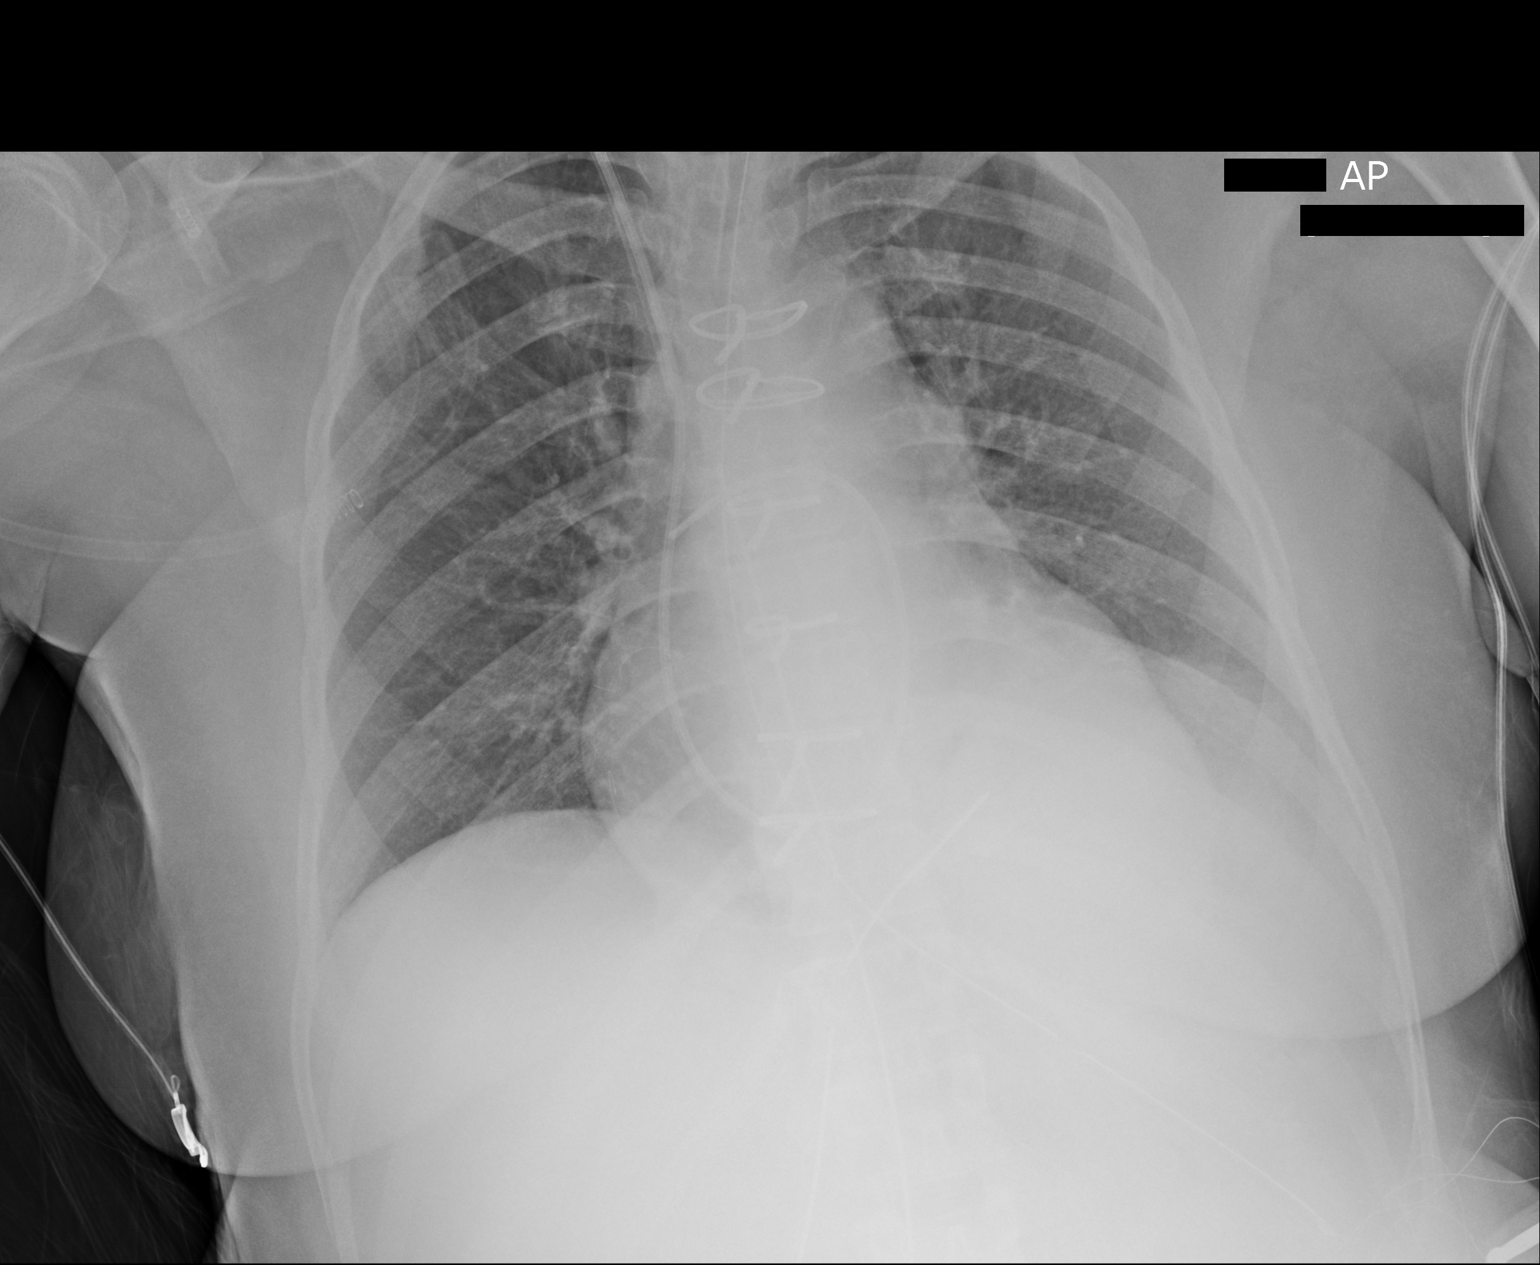

[1 of 1 positions shown; findings below may reference images not displayed]

FINDINGS: Low lung volumes. Endotracheal tube 5 cm above the carina. A
Swan-Ganz catheter is appreciated tip projecting in the central
mediastinum. Mediastinal drain is appreciated. NG tube is seen tip
projecting in the region of the stomach. The patient is status post
median sternotomy. Increased density left lung base. Lungs otherwise
clear. No evidence for pneumothorax. No acute osseous abnormalities.
IMPRESSION: Support lines and tubes adequately positioned.

Likely atelectasis and possibly small effusion left lung base.

## 2014-06-23 DIAGNOSIS — Z0279 Encounter for issue of other medical certificate: Secondary | ICD-10-CM

## 2014-07-01 ENCOUNTER — Ambulatory Visit: Payer: No Typology Code available for payment source | Admitting: Internal Medicine

## 2014-07-07 ENCOUNTER — Other Ambulatory Visit (HOSPITAL_COMMUNITY)
Admission: RE | Admit: 2014-07-07 | Discharge: 2014-07-07 | Disposition: A | Discharge status: Home / Self Care | Payer: No Typology Code available for payment source | Source: Ambulatory Visit | Attending: Internal Medicine | Admitting: Internal Medicine

## 2014-07-07 ENCOUNTER — Ambulatory Visit: Payer: No Typology Code available for payment source | Attending: Internal Medicine | Admitting: Internal Medicine

## 2014-07-07 ENCOUNTER — Encounter: Payer: Self-pay | Admitting: Internal Medicine

## 2014-07-07 VITALS — BP 128/74 | HR 66 | Temp 98.3°F | Resp 18 | Ht 66.5 in | Wt 182.6 lb

## 2014-07-07 DIAGNOSIS — E1165 Type 2 diabetes mellitus with hyperglycemia: Secondary | ICD-10-CM

## 2014-07-07 DIAGNOSIS — Z0000 Encounter for general adult medical examination without abnormal findings: Secondary | ICD-10-CM

## 2014-07-07 DIAGNOSIS — Z1272 Encounter for screening for malignant neoplasm of vagina: Secondary | ICD-10-CM | POA: Insufficient documentation

## 2014-07-07 DIAGNOSIS — K219 Gastro-esophageal reflux disease without esophagitis: Secondary | ICD-10-CM | POA: Insufficient documentation

## 2014-07-07 DIAGNOSIS — I4891 Unspecified atrial fibrillation: Secondary | ICD-10-CM | POA: Insufficient documentation

## 2014-07-07 DIAGNOSIS — F1910 Other psychoactive substance abuse, uncomplicated: Secondary | ICD-10-CM | POA: Insufficient documentation

## 2014-07-07 DIAGNOSIS — I499 Cardiac arrhythmia, unspecified: Secondary | ICD-10-CM | POA: Insufficient documentation

## 2014-07-07 DIAGNOSIS — Z888 Allergy status to other drugs, medicaments and biological substances status: Secondary | ICD-10-CM | POA: Insufficient documentation

## 2014-07-07 DIAGNOSIS — Z1151 Encounter for screening for human papillomavirus (HPV): Secondary | ICD-10-CM | POA: Insufficient documentation

## 2014-07-07 DIAGNOSIS — N760 Acute vaginitis: Secondary | ICD-10-CM | POA: Insufficient documentation

## 2014-07-07 DIAGNOSIS — Z01419 Encounter for gynecological examination (general) (routine) without abnormal findings: Secondary | ICD-10-CM | POA: Insufficient documentation

## 2014-07-07 DIAGNOSIS — IMO0001 Reserved for inherently not codable concepts without codable children: Secondary | ICD-10-CM | POA: Insufficient documentation

## 2014-07-07 DIAGNOSIS — Z7982 Long term (current) use of aspirin: Secondary | ICD-10-CM | POA: Insufficient documentation

## 2014-07-07 DIAGNOSIS — Z113 Encounter for screening for infections with a predominantly sexual mode of transmission: Secondary | ICD-10-CM | POA: Insufficient documentation

## 2014-07-07 DIAGNOSIS — R0789 Other chest pain: Secondary | ICD-10-CM | POA: Insufficient documentation

## 2014-07-07 DIAGNOSIS — E119 Type 2 diabetes mellitus without complications: Secondary | ICD-10-CM

## 2014-07-07 DIAGNOSIS — D509 Iron deficiency anemia, unspecified: Secondary | ICD-10-CM | POA: Insufficient documentation

## 2014-07-07 DIAGNOSIS — M542 Cervicalgia: Secondary | ICD-10-CM

## 2014-07-07 DIAGNOSIS — I519 Heart disease, unspecified: Secondary | ICD-10-CM | POA: Insufficient documentation

## 2014-07-07 DIAGNOSIS — R011 Cardiac murmur, unspecified: Secondary | ICD-10-CM | POA: Insufficient documentation

## 2014-07-07 LAB — POCT URINALYSIS DIPSTICK
Bilirubin, UA: NEGATIVE
GLUCOSE UA: NEGATIVE
Ketones, UA: NEGATIVE
Leukocytes, UA: NEGATIVE
Nitrite, UA: NEGATIVE
PROTEIN UA: NEGATIVE
RBC UA: NEGATIVE
UROBILINOGEN UA: 0.2
pH, UA: 5.5

## 2014-07-07 LAB — POCT GLYCOSYLATED HEMOGLOBIN (HGB A1C): Hemoglobin A1C: 6.2

## 2014-07-07 LAB — GLUCOSE, POCT (MANUAL RESULT ENTRY): POC GLUCOSE: 161 mg/dL — AB (ref 70–99)

## 2014-07-07 MED ORDER — ACETAMINOPHEN-CODEINE #3 300-30 MG PO TABS: 1.0000 | ORAL_TABLET | ORAL | Status: DC | PRN

## 2014-07-07 MED ORDER — OMEPRAZOLE 40 MG PO CPDR: 40.0000 mg | DELAYED_RELEASE_CAPSULE | Freq: Every day | ORAL | Status: DC
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
## 2014-07-07 NOTE — Patient Instructions (Signed)
DASH Eating Plan DASH stands for "Dietary Approaches to Stop Hypertension." The DASH eating plan is a healthy eating plan that has been shown to reduce high blood pressure (hypertension). Additional health benefits may include reducing the risk of type 2 diabetes mellitus, heart disease, and stroke. The DASH eating plan may also help with weight loss. WHAT DO I NEED TO KNOW ABOUT THE DASH EATING PLAN? For the DASH eating plan, you will follow these general guidelines:  Choose foods with a percent daily value for sodium of less than 5% (as listed on the food label).  Use salt-free seasonings or herbs instead of table salt or sea salt.  Check with your health care provider or pharmacist before using salt substitutes.  Eat lower-sodium products, often labeled as "lower sodium" or "no salt added."  Eat fresh foods.  Eat more vegetables, fruits, and low-fat dairy products.  Choose whole grains. Look for the word "whole" as the first word in the ingredient list.  Choose fish and skinless chicken or turkey more often than red meat. Limit fish, poultry, and meat to 6 oz (170 g) each day.  Limit sweets, desserts, sugars, and sugary drinks.  Choose heart-healthy fats.  Limit cheese to 1 oz (28 g) per day.  Eat more home-cooked food and less restaurant, buffet, and fast food.  Limit fried foods.  Cook foods using methods other than frying.  Limit canned vegetables. If you do use them, rinse them well to decrease the sodium.  When eating at a restaurant, ask that your food be prepared with less salt, or no salt if possible. WHAT FOODS CAN I EAT? Seek help from a dietitian for individual calorie needs. Grains Whole grain or whole wheat bread. Brown rice. Whole grain or whole wheat pasta. Quinoa, bulgur, and whole grain cereals. Low-sodium cereals. Corn or whole wheat flour tortillas. Whole grain cornbread. Whole grain crackers. Low-sodium crackers. Vegetables Fresh or frozen vegetables  (raw, steamed, roasted, or grilled). Low-sodium or reduced-sodium tomato and vegetable juices. Low-sodium or reduced-sodium tomato sauce and paste. Low-sodium or reduced-sodium canned vegetables.  Fruits All fresh, canned (in natural juice), or frozen fruits. Meat and Other Protein Products Ground beef (85% or leaner), grass-fed beef, or beef trimmed of fat. Skinless chicken or turkey. Ground chicken or turkey. Pork trimmed of fat. All fish and seafood. Eggs. Dried beans, peas, or lentils. Unsalted nuts and seeds. Unsalted canned beans. Dairy Low-fat dairy products, such as skim or 1% milk, 2% or reduced-fat cheeses, low-fat ricotta or cottage cheese, or plain low-fat yogurt. Low-sodium or reduced-sodium cheeses. Fats and Oils Tub margarines without trans fats. Light or reduced-fat mayonnaise and salad dressings (reduced sodium). Avocado. Safflower, olive, or canola oils. Natural peanut or almond butter. Other Unsalted popcorn and pretzels. The items listed above may not be a complete list of recommended foods or beverages. Contact your dietitian for more options. WHAT FOODS ARE NOT RECOMMENDED? Grains White bread. White pasta. White rice. Refined cornbread. Bagels and croissants. Crackers that contain trans fat. Vegetables Creamed or fried vegetables. Vegetables in a cheese sauce. Regular canned vegetables. Regular canned tomato sauce and paste. Regular tomato and vegetable juices. Fruits Dried fruits. Canned fruit in light or heavy syrup. Fruit juice. Meat and Other Protein Products Fatty cuts of meat. Ribs, chicken wings, bacon, sausage, bologna, salami, chitterlings, fatback, hot dogs, bratwurst, and packaged luncheon meats. Salted nuts and seeds. Canned beans with salt. Dairy Whole or 2% milk, cream, half-and-half, and cream cheese. Whole-fat or sweetened yogurt. Full-fat   cheeses or blue cheese. Nondairy creamers and whipped toppings. Processed cheese, cheese spreads, or cheese  curds. Condiments Onion and garlic salt, seasoned salt, table salt, and sea salt. Canned and packaged gravies. Worcestershire sauce. Tartar sauce. Barbecue sauce. Teriyaki sauce. Soy sauce, including reduced sodium. Steak sauce. Fish sauce. Oyster sauce. Cocktail sauce. Horseradish. Ketchup and mustard. Meat flavorings and tenderizers. Bouillon cubes. Hot sauce. Tabasco sauce. Marinades. Taco seasonings. Relishes. Fats and Oils Butter, stick margarine, lard, shortening, ghee, and bacon fat. Coconut, palm kernel, or palm oils. Regular salad dressings. Other Pickles and olives. Salted popcorn and pretzels. The items listed above may not be a complete list of foods and beverages to avoid. Contact your dietitian for more information. WHERE CAN I FIND MORE INFORMATION? National Heart, Lung, and Blood Institute: www.nhlbi.nih.gov/health/health-topics/topics/dash/ Document Released: 10/27/2011 Document Revised: 03/24/2014 Document Reviewed: 09/11/2013 ExitCare Patient Information 2015 Exit[employer], [employer]. This information is not intended to replace advice given to you by your health care provider. Make sure you discuss any questions you have with your health care provider. Diabetes and Exercise Exercising regularly is important. It is not just about losing weight. It has many health benefits, such as:  Improving your overall fitness, flexibility, and endurance.  Increasing your bone density.  Helping with weight control.  Decreasing your body fat.  Increasing your muscle strength.  Reducing stress and tension.  Improving your overall health. People with diabetes who exercise gain additional benefits because exercise:  Reduces appetite.  Improves the body's use of blood sugar (glucose).  Helps lower or control blood glucose.  Decreases blood pressure.  Helps control blood lipids (such as cholesterol and triglycerides).  Improves the body's use of the hormone insulin by:  Increasing the  body's insulin sensitivity.  Reducing the body's insulin needs.  Decreases the risk for heart disease because exercising:  Lowers cholesterol and triglycerides levels.  Increases the levels of good cholesterol (such as high-density lipoproteins [HDL]) in the body.  Lowers blood glucose levels. YOUR ACTIVITY PLAN  Choose an activity that you enjoy and set realistic goals. Your health care provider or diabetes educator can help you make an activity plan that works for you. Exercise regularly as directed by your health care provider. This includes:  Performing resistance training twice a week such as push-ups, sit-ups, lifting weights, or using resistance bands.  Performing 150 minutes of cardio exercises each week such as walking, running, or playing sports.  Staying active and spending no more than 90 minutes at one time being inactive. Even short bursts of exercise are good for you. Three 10-minute sessions spread throughout the day are just as beneficial as a single 30-minute session. Some exercise ideas include:  Taking the dog for a walk.  Taking the stairs instead of the elevator.  Dancing to your favorite song.  Doing an exercise video.  Doing your favorite exercise with a friend. RECOMMENDATIONS FOR EXERCISING WITH TYPE 1 OR TYPE 2 DIABETES   Check your blood glucose before exercising. If blood glucose levels are greater than 240 mg/dL, check for urine ketones. Do not exercise if ketones are present.  Avoid injecting insulin into areas of the body that are going to be exercised. For example, avoid injecting insulin into:  The arms when playing tennis.  The legs when jogging.  Keep a record of:  Food intake before and after you exercise.  Expected peak times of insulin action.  Blood glucose levels before and after you exercise.  The type and amount of exercise   you have done.  Review your records with your health care provider. Your health care provider will  help you to develop guidelines for adjusting food intake and insulin amounts before and after exercising.  If you take insulin or oral hypoglycemic agents, watch for signs and symptoms of hypoglycemia. They include:  Dizziness.  Shaking.  Sweating.  Chills.  Confusion.  Drink plenty of water while you exercise to prevent dehydration or heat stroke. Body water is lost during exercise and must be replaced.  Talk to your health care provider before starting an exercise program to make sure it is safe for you. Remember, almost any type of activity is better than none. Document Released: 01/28/2004 Document Revised: 03/24/2014 Document Reviewed: 04/16/2013 ExitCare Patient Information 2015 ExitCare, LLC. This information is not intended to replace advice given to you by your health care provider. Make sure you discuss any questions you have with your health care provider. Diabetes Mellitus and Food It is important for you to manage your blood sugar (glucose) level. Your blood glucose level can be greatly affected by what you eat. Eating healthier foods in the appropriate amounts throughout the day at about the same time each day will help you control your blood glucose level. It can also help slow or prevent worsening of your diabetes mellitus. Healthy eating may even help you improve the level of your blood pressure and reach or maintain a healthy weight.  HOW CAN FOOD AFFECT ME? Carbohydrates Carbohydrates affect your blood glucose level more than any other type of food. Your dietitian will help you determine how many carbohydrates to eat at each meal and teach you how to count carbohydrates. Counting carbohydrates is important to keep your blood glucose at a healthy level, especially if you are using insulin or taking certain medicines for diabetes mellitus. Alcohol Alcohol can cause sudden decreases in blood glucose (hypoglycemia), especially if you use insulin or take certain medicines for  diabetes mellitus. Hypoglycemia can be a life-threatening condition. Symptoms of hypoglycemia (sleepiness, dizziness, and disorientation) are similar to symptoms of having too much alcohol.  If your health care provider has given you approval to drink alcohol, do so in moderation and use the following guidelines:  Women should not have more than one drink per day, and men should not have more than two drinks per day. One drink is equal to:  12 oz of beer.  5 oz of wine.  1 oz of hard liquor.  Do not drink on an empty stomach.  Keep yourself hydrated. Have water, diet soda, or unsweetened iced tea.  Regular soda, juice, and other mixers might contain a lot of carbohydrates and should be counted. WHAT FOODS ARE NOT RECOMMENDED? As you make food choices, it is important to remember that all foods are not the same. Some foods have fewer nutrients per serving than other foods, even though they might have the same number of calories or carbohydrates. It is difficult to get your body what it needs when you eat foods with fewer nutrients. Examples of foods that you should avoid that are high in calories and carbohydrates but low in nutrients include:  Trans fats (most processed foods list trans fats on the Nutrition Facts label).  Regular soda.  Juice.  Candy.  Sweets, such as cake, pie, doughnuts, and cookies.  Fried foods. WHAT FOODS CAN I EAT? Have nutrient-rich foods, which will nourish your body and keep you healthy. The food you should eat also will depend on several factors,   including:  The calories you need.  The medicines you take.  Your weight.  Your blood glucose level.  Your blood pressure level.  Your cholesterol level. You also should eat a variety of foods, including:  Protein, such as meat, poultry, fish, tofu, nuts, and seeds (lean animal proteins are best).  Fruits.  Vegetables.  Dairy products, such as milk, cheese, and yogurt (low fat is  best).  Breads, grains, pasta, cereal, rice, and beans.  Fats such as olive oil, trans fat-free margarine, canola oil, avocado, and olives. DOES EVERYONE WITH DIABETES MELLITUS HAVE THE SAME MEAL PLAN? Because every person with diabetes mellitus is different, there is not one meal plan that works for everyone. It is very important that you meet with a dietitian who will help you create a meal plan that is just right for you. Document Released: 08/04/2005 Document Revised: 11/12/2013 Document Reviewed: 10/04/2013 ExitCare Patient Information 2015 ExitCare, [employer]. This information is not intended to replace advice given to you by your health care provider. Make sure you discuss any questions you have with your health care provider.  
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
## 2014-07-09 LAB — CYTOLOGY - PAP

## 2014-07-14 ENCOUNTER — Telehealth: Payer: Self-pay | Admitting: Emergency Medicine

## 2014-07-14 ENCOUNTER — Other Ambulatory Visit: Payer: Self-pay | Admitting: Emergency Medicine

## 2014-07-14 ENCOUNTER — Telehealth: Payer: Self-pay | Admitting: Internal Medicine

## 2014-07-14 MED ORDER — METRONIDAZOLE 500 MG PO TABS: 2000.0000 mg | ORAL_TABLET | Freq: Once | ORAL | Status: DC

## 2014-07-14 NOTE — Telephone Encounter (Signed)
Pt. Calling to request pap smear results. Please f/u with pt.

## 2014-07-14 NOTE — Telephone Encounter (Signed)
Pt given pap smear results. Pt instructed she will need to take Flagyl 500 mg x 4 tablets once for BV. Pt verbalized understanding. Will pick script up at [facility] pharmacy

## 2014-07-14 NOTE — Telephone Encounter (Signed)
Pt. Called to request pap smear results. Please f/u with pt  °

## 2014-07-15 ENCOUNTER — Encounter: Payer: Self-pay | Admitting: *Deleted

## 2014-07-15 ENCOUNTER — Telehealth: Payer: Self-pay | Admitting: Internal Medicine

## 2014-07-15 ENCOUNTER — Ambulatory Visit (INDEPENDENT_AMBULATORY_CARE_PROVIDER_SITE_OTHER): Payer: No Typology Code available for payment source | Admitting: Home Health Services

## 2014-07-15 DIAGNOSIS — E1165 Type 2 diabetes mellitus with hyperglycemia: Secondary | ICD-10-CM

## 2014-07-15 DIAGNOSIS — E119 Type 2 diabetes mellitus without complications: Secondary | ICD-10-CM

## 2014-07-15 NOTE — Progress Notes (Signed)
Form received for [employer] Application. Application placed on Dr. Louis Meckel Desk for signature. Annamaria Helling, RN

## 2014-07-15 NOTE — Telephone Encounter (Signed)
[employer] paper Dropped Off needs [employer], gave to Sherri/ Dr.Klein  8.25.15/km

## 2014-07-15 NOTE — Progress Notes (Signed)
DIABETES Pt came in to have a retinal scan per diabetic care.   Image was taken and submitted to [facility]-DR. Garg for reading.    Results will be available in 1-2 weeks.  Results will be given to PCP for review and to contact patient.  Jane Arellano  

## 2014-07-16 ENCOUNTER — Telehealth: Payer: Self-pay | Admitting: Emergency Medicine

## 2014-07-16 NOTE — Telephone Encounter (Signed)
Message copied by Darlis Loan on Wed Jul 16, 2014  9:35 AM ------      Message from: Jeanann Lewandowsky E      Created: Mon Jul 14, 2014 12:15 PM       Please inform patient that her Pap smear result is negative for malignancy ------

## 2014-07-16 NOTE — Telephone Encounter (Signed)
Attempted to reach pt with lab results. Both numbers listed invalid

## 2014-07-17 ENCOUNTER — Ambulatory Visit: Payer: No Typology Code available for payment source

## 2014-07-24 ENCOUNTER — Ambulatory Visit (INDEPENDENT_AMBULATORY_CARE_PROVIDER_SITE_OTHER): Payer: No Typology Code available for payment source | Admitting: Internal Medicine

## 2014-07-24 ENCOUNTER — Telehealth: Payer: Self-pay | Admitting: Internal Medicine

## 2014-07-24 VITALS — BP 119/80 | HR 54 | Ht 66.0 in | Wt 186.0 lb

## 2014-07-24 DIAGNOSIS — I480 Paroxysmal atrial fibrillation: Secondary | ICD-10-CM

## 2014-07-24 DIAGNOSIS — I4891 Unspecified atrial fibrillation: Secondary | ICD-10-CM

## 2014-07-24 MED ORDER — CARVEDILOL 3.125 MG PO TABS: 3.1250 mg | ORAL_TABLET | Freq: Two times a day (BID) | ORAL | Status: DC

## 2014-07-24 MED ORDER — AMLODIPINE BESYLATE 5 MG PO TABS: 5.0000 mg | ORAL_TABLET | Freq: Every day | ORAL | Status: DC

## 2014-07-24 NOTE — Patient Instructions (Addendum)
Your physician has recommended you make the following change in your medication:  1.) Decrease amlodipine to 5 mg once a day.  2.) Decrease Carvedilol to 3.125 mg twice a day. 3.) STOP Aspirin  Your physician wants you to follow-up in: 6 months with Dr. Graciela Husbands. You will receive a reminder letter in the mail two months in advance. If you don't receive a letter, please call our office to schedule the follow-up appointment.

## 2014-07-24 NOTE — Telephone Encounter (Signed)
New message      Just left office.  Want Dr Graciela Husbands to know she takes prodaxa 

## 2014-07-24 NOTE — Progress Notes (Signed)
Jane Arellano      Patient Care Team: Tresa Garter, MD as PCP - General (Internal Medicine)   HPI  Jane Arellano is a 53 y.o. female Seen in followup for recurrent atrial fibrillation for which he underwent cardioversion in early December.  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   She's had no atrial fibrillation in the last few months. He continues to have discomfort at the site of her incision. There is a significant keloid.    Past Medical History  Diagnosis Date  . Atypical chest pain     a. 11/2005 Negative Myoview  . Subaortic membrane     a. 01/2010 Echo: EF 55-60%, No rwma, subaortic membrane with elevated LVOT mean gradient of 21 mmHg, Triv AI, mod dil LA, mildly increased PASP. b. 05/02/2013 Echo:  LVEF 29-24%, grade 1 diastolic dysfunction, mild LVH, subaortic stenosis w/ turbulation in LVOT c/w subaortic membrane (mean gradient 42 mmHg/peak gradient 81 mmHg), mild biatrial enlargement  . GERD (gastroesophageal reflux disease)   . HTN (hypertension)   . Iron deficiency anemia   . History of substance abuse   . Paroxysmal atrial fibrillation 05/01/2013    On Xarelto  . Diastolic dysfunction   . Heart murmur   . DM2 (diabetes mellitus, type 2)   . Dysrhythmia     AFIB HX CV     Past Surgical History  Procedure Laterality Date  . Cesarean section    . Cardioversion N/A 11/04/2013    Procedure: CARDIOVERSION;  Surgeon: Peter M Martinique, MD;  Location: [facility] ENDOSCOPY;  Service: Cardiovascular;  Laterality: N/A;    Current Outpatient Prescriptions  Medication Sig Dispense Refill  . acetaminophen (TYLENOL) 500 MG tablet Take 1,000 mg by mouth every 6 (six) hours as needed for mild pain.      Marland Kitchen acetaminophen-codeine (TYLENOL #3) 300-30 MG per tablet Take 1 tablet by mouth every 4 (four) hours as needed.  60 tablet  0  . amLODipine (NORVASC) 10 MG tablet Take 1 tablet (10 mg total) by mouth daily.  30 tablet  1  . antiseptic oral  rinse (BIOTENE) LIQD 15 mLs by Mouth Rinse route 2 (two) times daily as needed for dry mouth.       Marland Kitchen aspirin EC 325 MG EC tablet Take 1 tablet (325 mg total) by mouth daily.  30 tablet  0  . Calcium Carb-Cholecalciferol (CALCIUM-VITAMIN D3) 500-400 MG-UNIT TABS Take 1 tablet by mouth daily.      . carvedilol (COREG) 6.25 MG tablet Take 1 tablet (6.25 mg total) by mouth 2 (two) times daily with a meal.  60 tablet  1  . dabigatran (PRADAXA) 75 MG CAPS capsule Take 1 capsule (75 mg total) by mouth 2 (two) times daily.  60 capsule  3  . ferrous sulfate 325 (65 FE) MG tablet Take 1 tablet (325 mg total) by mouth 3 (three) times daily with meals.      . fish oil-omega-3 fatty acids 1000 MG capsule Take 1 g by mouth daily.      . furosemide (LASIX) 20 MG tablet Take 20 mg by mouth daily.      Marland Kitchen glucose blood test strip Use as instructed  100 each  12  . glucose monitoring kit (FREESTYLE) monitoring kit 1 each by Does not apply route as needed for other.  1 each  0  . Lancets (FREESTYLE) lancets Use as instructed  100 each  12  . metFORMIN (  GLUCOPHAGE) 500 MG tablet TAKE 1 TABLET BY MOUTH 2 TIMES DAILY WITH A MEAL.  60 tablet  1  . Multiple Vitamin (MULTIVITAMIN WITH MINERALS) TABS tablet Take 1 tablet by mouth daily.      Marland Kitchen omeprazole (PRILOSEC) 40 MG capsule Take 1 capsule (40 mg total) by mouth daily.  30 capsule  3  . polyethylene glycol powder (GLYCOLAX/MIRALAX) powder Take 17 g by mouth daily.  850 g  1  . potassium chloride (MICRO-K) 10 MEQ CR capsule Take 10 mEq by mouth daily.      . [DISCONTINUED] potassium chloride (K-DUR,KLOR-CON) 10 MEQ tablet Take 1 tablet (10 mEq total) by mouth daily.  30 tablet  0   No current facility-administered medications for this visit.    Allergies  Allergen Reactions  . Ketorolac Tromethamine Other (See Comments)    Unknown reaction    Review of Systems negative except from HPI and PMH  Physical Exam BP 119/80  Pulse 54  Ht _0  (1.676 m)  Wt 186  lb (84.369 kg)  BMI 30.04 kg/m2 Well developed and well nourished in no acute distress HENT normal E scleral and icterus clear Neck Supple JVP flat; carotids brisk and full Clear to ausculation Keloid along the sternal incision regular rate and rhythm, 2/6 harsh systolic murmur with a split S2 Soft with active bowel sounds No clubbing cyanosis no  Edema Alert and oriented, grossly normal motor and sensory function Skin Warm and Dry  ECG demonstrates sinus rhythm at 54 intervals 15/10/45 Assessment and  Plan  Atrial fibrillation-paroxysmal  Aortic-subaortic membrane status post resection  Keloid  Hypertension  Sinus bradycardia  Functionally Mrs. Son is better. We'll decrease her carvedilol from 6.25--3.125. This might help with her heart rate. I should note that her preoperative LV function was normal.  We'll also decrease her amlodipine to 10--5 as her blood pressure is within range.  I've given her the name for Dr. Crissie Reese for consideration of therapy for keloid with injected prednisone  She will call to let us know what her Pradaxa dose is;  her paperwork suggested 75 mg twice daily which would be inappropriates;

## 2014-07-25 ENCOUNTER — Other Ambulatory Visit: Payer: Self-pay | Admitting: *Deleted

## 2014-07-25 ENCOUNTER — Encounter: Payer: Self-pay | Admitting: Internal Medicine

## 2014-07-25 MED ORDER — DABIGATRAN ETEXILATE MESYLATE 150 MG PO CAPS: 150.0000 mg | ORAL_CAPSULE | Freq: Two times a day (BID) | ORAL | Status: DC

## 2014-07-25 NOTE — Telephone Encounter (Signed)
Dr Graciela Husbands aware. Medication list updated

## 2014-08-06 ENCOUNTER — Telehealth: Payer: Self-pay | Admitting: Internal Medicine

## 2014-08-06 NOTE — Telephone Encounter (Signed)
New message      Pt is trying to re-enroll into the program for her prodaxa .  The presc they have is not signed---please call and give a verbal and give DEA number

## 2014-08-06 NOTE — Telephone Encounter (Signed)
Triage - can you please handle this for me, as I am out of the office tomorrow (and headed to ICD support group meeting now). Thanks.

## 2014-08-07 NOTE — Telephone Encounter (Signed)
I placed call to call back number and was transferred to Linda's voicemail. Left message with pt's name, Pradaxa dose, physician's name, Dr. Odessa Fleming DEA number and our phone number. Left message to call back if questions.

## 2014-08-14 ENCOUNTER — Ambulatory Visit: Payer: No Typology Code available for payment source | Attending: Internal Medicine | Admitting: Internal Medicine

## 2014-08-14 ENCOUNTER — Encounter: Payer: Self-pay | Admitting: Internal Medicine

## 2014-08-14 VITALS — BP 147/92 | HR 61 | Temp 97.6°F | Resp 16 | Ht 66.0 in | Wt 188.0 lb

## 2014-08-14 DIAGNOSIS — H538 Other visual disturbances: Secondary | ICD-10-CM | POA: Insufficient documentation

## 2014-08-14 DIAGNOSIS — E1149 Type 2 diabetes mellitus with other diabetic neurological complication: Secondary | ICD-10-CM | POA: Insufficient documentation

## 2014-08-14 DIAGNOSIS — I10 Essential (primary) hypertension: Secondary | ICD-10-CM | POA: Insufficient documentation

## 2014-08-14 DIAGNOSIS — E119 Type 2 diabetes mellitus without complications: Secondary | ICD-10-CM | POA: Insufficient documentation

## 2014-08-14 DIAGNOSIS — Z7901 Long term (current) use of anticoagulants: Secondary | ICD-10-CM | POA: Insufficient documentation

## 2014-08-14 DIAGNOSIS — K219 Gastro-esophageal reflux disease without esophagitis: Secondary | ICD-10-CM | POA: Insufficient documentation

## 2014-08-14 DIAGNOSIS — I4891 Unspecified atrial fibrillation: Secondary | ICD-10-CM | POA: Insufficient documentation

## 2014-08-14 DIAGNOSIS — Z23 Encounter for immunization: Secondary | ICD-10-CM | POA: Insufficient documentation

## 2014-08-14 MED ORDER — METFORMIN HCL 500 MG PO TABS: 500.0000 mg | ORAL_TABLET | Freq: Two times a day (BID) | ORAL | Status: DC

## 2014-08-14 MED ORDER — FUROSEMIDE 20 MG PO TABS: 20.0000 mg | ORAL_TABLET | Freq: Every day | ORAL | Status: DC

## 2014-08-14 MED ORDER — GABAPENTIN 100 MG PO CAPS: 100.0000 mg | ORAL_CAPSULE | Freq: Three times a day (TID) | ORAL | Status: DC

## 2014-08-14 MED ORDER — FAMOTIDINE 20 MG PO TABS: 20.0000 mg | ORAL_TABLET | Freq: Two times a day (BID) | ORAL | Status: DC

## 2014-08-14 NOTE — Patient Instructions (Signed)
Diabetes Mellitus and Food It is important for you to manage your blood sugar (glucose) level. Your blood glucose level can be greatly affected by what you eat. Eating healthier foods in the appropriate amounts throughout the day at about the same time each day will help you control your blood glucose level. It can also help slow or prevent worsening of your diabetes mellitus. Healthy eating may even help you improve the level of your blood pressure and reach or maintain a healthy weight.  HOW CAN FOOD AFFECT ME? Carbohydrates Carbohydrates affect your blood glucose level more than any other type of food. Your dietitian will help you determine how many carbohydrates to eat at each meal and teach you how to count carbohydrates. Counting carbohydrates is important to keep your blood glucose at a healthy level, especially if you are using insulin or taking certain medicines for diabetes mellitus. Alcohol Alcohol can cause sudden decreases in blood glucose (hypoglycemia), especially if you use insulin or take certain medicines for diabetes mellitus. Hypoglycemia can be a life-threatening condition. Symptoms of hypoglycemia (sleepiness, dizziness, and disorientation) are similar to symptoms of having too much alcohol.  If your health care provider has given you approval to drink alcohol, do so in moderation and use the following guidelines:  Women should not have more than one drink per day, and men should not have more than two drinks per day. One drink is equal to:  12 oz of beer.  5 oz of wine.  1 oz of hard liquor.  Do not drink on an empty stomach.  Keep yourself hydrated. Have water, diet soda, or unsweetened iced tea.  Regular soda, juice, and other mixers might contain a lot of carbohydrates and should be counted. WHAT FOODS ARE NOT RECOMMENDED? As you make food choices, it is important to remember that all foods are not the same. Some foods have fewer nutrients per serving than other  foods, even though they might have the same number of calories or carbohydrates. It is difficult to get your body what it needs when you eat foods with fewer nutrients. Examples of foods that you should avoid that are high in calories and carbohydrates but low in nutrients include:  Trans fats (most processed foods list trans fats on the Nutrition Facts label).  Regular soda.  Juice.  Candy.  Sweets, such as cake, pie, doughnuts, and cookies.  Fried foods. WHAT FOODS CAN I EAT? Have nutrient-rich foods, which will nourish your body and keep you healthy. The food you should eat also will depend on several factors, including:  The calories you need.  The medicines you take.  Your weight.  Your blood glucose level.  Your blood pressure level.  Your cholesterol level. You also should eat a variety of foods, including:  Protein, such as meat, poultry, fish, tofu, nuts, and seeds (lean animal proteins are best).  Fruits.  Vegetables.  Dairy products, such as milk, cheese, and yogurt (low fat is best).  Breads, grains, pasta, cereal, rice, and beans.  Fats such as olive oil, trans fat-free margarine, canola oil, avocado, and olives. DOES EVERYONE WITH DIABETES MELLITUS HAVE THE SAME MEAL PLAN? Because every person with diabetes mellitus is different, there is not one meal plan that works for everyone. It is very important that you meet with a dietitian who will help you create a meal plan that is just right for you. Document Released: 08/04/2005 Document Revised: 11/12/2013 Document Reviewed: 10/04/2013 ExitCare Patient Information 2015 [employer]. This   information is not intended to replace advice given to you by your health care provider. Make sure you discuss any questions you have with your health care provider. Diabetes and Exercise Exercising regularly is important. It is not just about losing weight. It has many health benefits, such as:  Improving your overall  fitness, flexibility, and endurance.  Increasing your bone density.  Helping with weight control.  Decreasing your body fat.  Increasing your muscle strength.  Reducing stress and tension.  Improving your overall health. People with diabetes who exercise gain additional benefits because exercise:  Reduces appetite.  Improves the body's use of blood sugar (glucose).  Helps lower or control blood glucose.  Decreases blood pressure.  Helps control blood lipids (such as cholesterol and triglycerides).  Improves the body's use of the hormone insulin by:  Increasing the body's insulin sensitivity.  Reducing the body's insulin needs.  Decreases the risk for heart disease because exercising:  Lowers cholesterol and triglycerides levels.  Increases the levels of good cholesterol (such as high-density lipoproteins [HDL]) in the body.  Lowers blood glucose levels. YOUR ACTIVITY PLAN  Choose an activity that you enjoy and set realistic goals. Your health care provider or diabetes educator can help you make an activity plan that works for you. Exercise regularly as directed by your health care provider. This includes:  Performing resistance training twice a week such as push-ups, sit-ups, lifting weights, or using resistance bands.  Performing 150 minutes of cardio exercises each week such as walking, running, or playing sports.  Staying active and spending no more than 90 minutes at one time being inactive. Even short bursts of exercise are good for you. Three 10-minute sessions spread throughout the day are just as beneficial as a single 30-minute session. Some exercise ideas include:  Taking the dog for a walk.  Taking the stairs instead of the elevator.  Dancing to your favorite song.  Doing an exercise video.  Doing your favorite exercise with a friend. RECOMMENDATIONS FOR EXERCISING WITH TYPE 1 OR TYPE 2 DIABETES   Check your blood glucose before exercising. If  blood glucose levels are greater than 240 mg/dL, check for urine ketones. Do not exercise if ketones are present.  Avoid injecting insulin into areas of the body that are going to be exercised. For example, avoid injecting insulin into:  The arms when playing tennis.  The legs when jogging.  Keep a record of:  Food intake before and after you exercise.  Expected peak times of insulin action.  Blood glucose levels before and after you exercise.  The type and amount of exercise you have done.  Review your records with your health care provider. Your health care provider will help you to develop guidelines for adjusting food intake and insulin amounts before and after exercising.  If you take insulin or oral hypoglycemic agents, watch for signs and symptoms of hypoglycemia. They include:  Dizziness.  Shaking.  Sweating.  Chills.  Confusion.  Drink plenty of water while you exercise to prevent dehydration or heat stroke. Body water is lost during exercise and must be replaced.  Talk to your health care provider before starting an exercise program to make sure it is safe for you. Remember, almost any type of activity is better than none. Document Released: 01/28/2004 Document Revised: 03/24/2014 Document Reviewed: 04/16/2013 ExitCare Patient Information 2015 [employer]. This information is not intended to replace advice given to you by your health care provider. Make sure you discuss any   questions you have with your health care provider.  

## 2014-08-14 NOTE — Progress Notes (Signed)
Pt comes in with c/o neuropathy in both feet starting last week. C/o burning sensation with pins/tingling sensation Pain is radiating to lower legs CBG at home-114 Denies blurry vision,dizziness or headache Need some medication refill

## 2014-08-14 NOTE — Progress Notes (Signed)
Patient ID: Jane Arellano, female   DOB: May 07, 1961, 53 y.o.   MRN: [id number]   Jane Arellano, is a 53 y.o. female  [id number]  [id number]  DOB - [birth year]  Chief Complaint  Patient presents with  . Follow-up  . Hypertension  . Diabetes  . Medication Refill        Subjective:   Jane Arellano is a 53 y.o. female here today for a follow up visit. Patient has history of hypertension, diabetes mellitus, paroxysmal atrial fibrillation on Xarelto, history of polysubstance abuse and GERD here today for routine followup. She complains of bilateral burning sensation and tingling of both feet. She also wants to be referred to an optometrist for refraction, she said her glasses is becoming blurry and one of the hand was broken, she needs a new prescription glasses. She has had diabetic retinopathy screening recently was negative. She claims her blood sugar is well controlled on current regimen. Her blood pressure was also within normal limit at home. She'll need some medication refills. Patient has No headache, No chest pain, No abdominal pain - No Nausea, No new weakness tingling or numbness, No Cough - SOB.  Problem  Need for Prophylactic Vaccination and Inoculation Against Influenza  Essential Hypertension  Type 2 Diabetes Mellitus Without Complication  Blurry Vision, Bilateral    ALLERGIES: Allergies  Allergen Reactions  . Ketorolac Tromethamine Other (See Comments)    Unknown reaction    PAST MEDICAL HISTORY: Past Medical History  Diagnosis Date  . Atypical chest pain     a. 11/2005 Negative Myoview  . Subaortic membrane     a. 01/2010 Echo: EF 55-60%, No rwma, subaortic membrane with elevated LVOT mean gradient of 21 mmHg, Triv AI, mod dil LA, mildly increased PASP. b. 05/02/2013 Echo:  LVEF 30-16%, grade 1 diastolic dysfunction, mild LVH, subaortic stenosis w/ turbulation in LVOT c/w subaortic membrane (mean gradient 42 mmHg/peak gradient 81 mmHg), mild biatrial  enlargement  . GERD (gastroesophageal reflux disease)   . HTN (hypertension)   . Iron deficiency anemia   . History of substance abuse   . Paroxysmal atrial fibrillation 05/01/2013    On Xarelto  . Diastolic dysfunction   . Heart murmur   . DM2 (diabetes mellitus, type 2)   . Dysrhythmia     AFIB HX CV     MEDICATIONS AT HOME: Prior to Admission medications   Medication Sig Start Date End Date Taking? Authorizing Provider  acetaminophen-codeine (TYLENOL #3) 300-30 MG per tablet Take 1 tablet by mouth every 4 (four) hours as needed. 07/07/14  Yes Tresa Garter, MD  amLODipine (NORVASC) 5 MG tablet Take 1 tablet (5 mg total) by mouth daily. 07/24/14  Yes Deboraha Sprang, MD  Calcium Carb-Cholecalciferol (CALCIUM-VITAMIN D3) 500-400 MG-UNIT TABS Take 1 tablet by mouth daily.   Yes Historical Provider, MD  carvedilol (COREG) 3.125 MG tablet Take 1 tablet (3.125 mg total) by mouth 2 (two) times daily. 07/24/14  Yes Deboraha Sprang, MD  dabigatran (PRADAXA) 150 MG CAPS capsule Take 1 capsule (150 mg total) by mouth 2 (two) times daily. 07/25/14  Yes Deboraha Sprang, MD  ferrous sulfate 325 (65 FE) MG tablet Take 1 tablet (325 mg total) by mouth 3 (three) times daily with meals. 07/31/13  Yes Liliane Shi, PA-C  fish oil-omega-3 fatty acids 1000 MG capsule Take 1 g by mouth daily.   Yes Historical Provider, MD  furosemide (LASIX) 20 MG tablet Take 1 tablet (20 mg  total) by mouth daily. 08/14/14  Yes Tresa Garter, MD  metFORMIN (GLUCOPHAGE) 500 MG tablet Take 1 tablet (500 mg total) by mouth 2 (two) times daily with a meal. 08/14/14  Yes Tresa Garter, MD  Multiple Vitamin (MULTIVITAMIN WITH MINERALS) TABS tablet Take 1 tablet by mouth daily.   Yes Historical Provider, MD  potassium chloride (MICRO-K) 10 MEQ CR capsule Take 10 mEq by mouth daily.   Yes Historical Provider, MD  acetaminophen (TYLENOL) 500 MG tablet Take 1,000 mg by mouth every 6 (six) hours as needed for mild pain.     Historical Provider, MD  antiseptic oral rinse (BIOTENE) LIQD 15 mLs by Mouth Rinse route 2 (two) times daily as needed for dry mouth.     Historical Provider, MD  famotidine (PEPCID) 20 MG tablet Take 1 tablet (20 mg total) by mouth 2 (two) times daily. 08/14/14   Tresa Garter, MD  gabapentin (NEURONTIN) 100 MG capsule Take 1 capsule (100 mg total) by mouth 3 (three) times daily. 08/14/14   Tresa Garter, MD  glucose blood test strip Use as instructed 10/03/13   Tresa Garter, MD  glucose monitoring kit (FREESTYLE) monitoring kit 1 each by Does not apply route as needed for other. 05/09/13   Barton Dubois, MD  Lancets (FREESTYLE) lancets Use as instructed 10/03/13   Tresa Garter, MD  polyethylene glycol powder (GLYCOLAX/MIRALAX) powder Take 17 g by mouth daily. 03/31/14   Lance Bosch, NP     Objective:   Filed Vitals:   08/14/14 0927  BP: 147/92  Pulse: 61  Temp: 97.6 F (36.4 C)  TempSrc: Oral  Resp: 16  Height: _0  (1.676 m)  Weight: 188 lb (85.276 kg)  SpO2: 100%    Exam General appearance : Awake, alert, not in any distress. Speech Clear. Not toxic looking HEENT: Atraumatic and Normocephalic, pupils equally reactive to light and accomodation Neck: supple, no JVD. No cervical lymphadenopathy.  Chest:Good air entry bilaterally, no added sounds  CVS: S1 S2 regular, no murmurs.  Abdomen: Bowel sounds present, Non tender and not distended with no gaurding, rigidity or rebound. Extremities: B/L Lower Ext shows no edema, both legs are warm to touch Neurology: Awake alert, and oriented X 3, CN II-XII intact, Non focal Skin:No Rash Wounds:N/A  Data Review Lab Results  Component Value Date   HGBA1C 6.2 07/07/2014   HGBA1C 5.9 03/31/2014   HGBA1C 6.4* 03/14/2014     Assessment & Plan   1. Gastroesophageal reflux disease without esophagitis Change Pepcid from omeprazole because of cost  - famotidine (PEPCID) 20 MG tablet; Take 1 tablet (20 mg  total) by mouth 2 (two) times daily.  Dispense: 180 tablet; Refill: 3  2. Need for prophylactic vaccination and inoculation against influenza Given  3. Essential hypertension Refill - furosemide (LASIX) 20 MG tablet; Take 1 tablet (20 mg total) by mouth daily.  Dispense: 90 tablet; Refill: 3 DASH diet  4. Type 2 diabetes mellitus without complication Hemoglobin Z4M is 6.2% last month, due for A1c check in 2 months  Refill - metFORMIN (GLUCOPHAGE) 500 MG tablet; Take 1 tablet (500 mg total) by mouth 2 (two) times daily with a meal.  Dispense: 180 tablet; Refill: 3  Prescribe- gabapentin (NEURONTIN) 100 MG capsule; Take 1 capsule (100 mg total) by mouth 3 (three) times daily.  Dispense: 90 capsule; Refill: 3  Diabetic diet  5. Blurry vision, bilateral  - Ambulatory referral to Optometry for refraction change  of glasses   Patient was extensively counseled on nutrition and exercise Patient was counseled extensively about smoking cessation   Return in about 3 months (around 11/13/2014), or if symptoms worsen or fail to improve, for Hemoglobin A1C and Follow up, DM, Follow up HTN.  The patient was given clear instructions to go to ER or return to medical center if symptoms don't improve, worsen or new problems develop. The patient verbalized understanding. The patient was told to call to get lab results if they haven't heard anything in the next week.   This note has been created with Surveyor, quantity. Any transcriptional errors are unintentional.    Angelica Chessman, MD, [place], [place], [place] and [place], [place]   08/14/2014, 10:17 AM

## 2014-08-21 ENCOUNTER — Ambulatory Visit: Payer: No Typology Code available for payment source | Admitting: Internal Medicine

## 2014-10-09 ENCOUNTER — Other Ambulatory Visit: Payer: Self-pay | Admitting: Internal Medicine

## 2014-12-01 ENCOUNTER — Encounter: Payer: No Typology Code available for payment source | Admitting: Internal Medicine

## 2014-12-10 ENCOUNTER — Ambulatory Visit: Payer: No Typology Code available for payment source | Attending: Internal Medicine | Admitting: Family Medicine

## 2014-12-10 ENCOUNTER — Other Ambulatory Visit (HOSPITAL_COMMUNITY)
Admission: RE | Admit: 2014-12-10 | Discharge: 2014-12-10 | Disposition: A | Discharge status: Home / Self Care | Payer: No Typology Code available for payment source | Source: Ambulatory Visit | Attending: Internal Medicine | Admitting: Internal Medicine

## 2014-12-10 ENCOUNTER — Encounter: Payer: Self-pay | Admitting: Family Medicine

## 2014-12-10 VITALS — BP 159/89 | HR 61 | Temp 98.1°F | Resp 16 | Ht 67.0 in | Wt 190.0 lb

## 2014-12-10 DIAGNOSIS — A499 Bacterial infection, unspecified: Secondary | ICD-10-CM

## 2014-12-10 DIAGNOSIS — B9689 Other specified bacterial agents as the cause of diseases classified elsewhere: Secondary | ICD-10-CM

## 2014-12-10 DIAGNOSIS — Z1389 Encounter for screening for other disorder: Secondary | ICD-10-CM | POA: Insufficient documentation

## 2014-12-10 DIAGNOSIS — N760 Acute vaginitis: Secondary | ICD-10-CM | POA: Insufficient documentation

## 2014-12-10 DIAGNOSIS — L910 Hypertrophic scar: Secondary | ICD-10-CM | POA: Insufficient documentation

## 2014-12-10 DIAGNOSIS — Z0000 Encounter for general adult medical examination without abnormal findings: Secondary | ICD-10-CM

## 2014-12-10 DIAGNOSIS — E1149 Type 2 diabetes mellitus with other diabetic neurological complication: Secondary | ICD-10-CM

## 2014-12-10 DIAGNOSIS — B351 Tinea unguium: Secondary | ICD-10-CM | POA: Insufficient documentation

## 2014-12-10 DIAGNOSIS — H6123 Impacted cerumen, bilateral: Secondary | ICD-10-CM

## 2014-12-10 DIAGNOSIS — Z23 Encounter for immunization: Secondary | ICD-10-CM

## 2014-12-10 DIAGNOSIS — E119 Type 2 diabetes mellitus without complications: Secondary | ICD-10-CM

## 2014-12-10 DIAGNOSIS — H6120 Impacted cerumen, unspecified ear: Secondary | ICD-10-CM | POA: Insufficient documentation

## 2014-12-10 DIAGNOSIS — Z113 Encounter for screening for infections with a predominantly sexual mode of transmission: Secondary | ICD-10-CM | POA: Insufficient documentation

## 2014-12-10 DIAGNOSIS — Z1211 Encounter for screening for malignant neoplasm of colon: Secondary | ICD-10-CM | POA: Insufficient documentation

## 2014-12-10 DIAGNOSIS — E1142 Type 2 diabetes mellitus with diabetic polyneuropathy: Secondary | ICD-10-CM | POA: Insufficient documentation

## 2014-12-10 DIAGNOSIS — I10 Essential (primary) hypertension: Secondary | ICD-10-CM | POA: Insufficient documentation

## 2014-12-10 DIAGNOSIS — E1140 Type 2 diabetes mellitus with diabetic neuropathy, unspecified: Secondary | ICD-10-CM

## 2014-12-10 LAB — COMPLETE METABOLIC PANEL WITH GFR
ALK PHOS: 62 U/L (ref 39–117)
ALT: 22 U/L (ref 0–35)
AST: 23 U/L (ref 0–37)
Albumin: 4.8 g/dL (ref 3.5–5.2)
BUN: 14 mg/dL (ref 6–23)
CO2: 26 meq/L (ref 19–32)
Calcium: 10.4 mg/dL (ref 8.4–10.5)
Chloride: 103 mEq/L (ref 96–112)
Creat: 0.66 mg/dL (ref 0.50–1.10)
GLUCOSE: 110 mg/dL — AB (ref 70–99)
Potassium: 4.6 mEq/L (ref 3.5–5.3)
Sodium: 141 mEq/L (ref 135–145)
TOTAL PROTEIN: 8.8 g/dL — AB (ref 6.0–8.3)
Total Bilirubin: 0.5 mg/dL (ref 0.2–1.2)

## 2014-12-10 LAB — CBC
HCT: 38 % (ref 36.0–46.0)
HEMOGLOBIN: 12.8 g/dL (ref 12.0–15.0)
MCH: 32.3 pg (ref 26.0–34.0)
MCHC: 33.7 g/dL (ref 30.0–36.0)
MCV: 96 fL (ref 78.0–100.0)
MPV: 12 fL (ref 8.6–12.4)
Platelets: 213 10*3/uL (ref 150–400)
RBC: 3.96 MIL/uL (ref 3.87–5.11)
RDW: 13.6 % (ref 11.5–15.5)
WBC: 5.2 10*3/uL (ref 4.0–10.5)

## 2014-12-10 LAB — POCT GLYCOSYLATED HEMOGLOBIN (HGB A1C): HEMOGLOBIN A1C: 6.2

## 2014-12-10 MED ORDER — CARBAMIDE PEROXIDE 6.5 % OT SOLN: 5.0000 [drp] | Freq: Two times a day (BID) | OTIC | Status: DC

## 2014-12-10 MED ORDER — GABAPENTIN 300 MG PO CAPS: 300.0000 mg | ORAL_CAPSULE | Freq: Three times a day (TID) | ORAL | Status: DC

## 2014-12-10 MED ORDER — AMLODIPINE BESYLATE 10 MG PO TABS: 10.0000 mg | ORAL_TABLET | Freq: Every day | ORAL | Status: DC

## 2014-12-10 NOTE — Assessment & Plan Note (Signed)
A: cerumen impaction did not respond to irrigation  P: debrox

## 2014-12-10 NOTE — Assessment & Plan Note (Signed)
GI referral for screening colonoscopy.

## 2014-12-10 NOTE — Progress Notes (Signed)
   Subjective:    Patient ID: Jane Arellano, female    DOB: 09/01/1961, 54 y.o.   MRN: [id number] CC: wellness physical, had a recent pap, diabetic foot pain  HPI 54 yo F with DM2, HTN, CAD:  Acute complaints/cocerns: diabetic foot pain taking gabapentin 100 mg TID, request STD testing   1. HTN: BP elevated. Patient complaint with medications. Taking lasix 20, coreg 3.125 BID, norvasc 5. Intermittent slight CP, non exertional. No SOB, HA, LE edema.   2. Diabetes: CBGs well controlled. Numbness and tingling in both feet. Compliant with gabapentin   Reviewed Health Maintenance: Patient due for diabetic foot exam, urine microalbumin, mammogram, Tdap, colonoscopy.  Review of Systems General:  Negative for nexplained weight loss, fever Skin: Negative for new or changing mole, sore that won't heal HEENT: Negative for trouble hearing, trouble seeing, ringing in ears, mouth sores, hoarseness, change in voice, dysphagia. CV:  Negative for dyspnea, edema, palpitations Resp: Negative for cough, dyspnea, hemoptysis GI: Negative for nausea, vomiting, diarrhea, constipation, abdominal pain, melena, hematochezia. GU: Negative for dysuria, incontinence, urinary hesitance, hematuria, vaginal or penile discharge, polyuria, sexual difficulty, lumps in testicle or breasts MSK: Negative for muscle cramps or aches, joint pain or swelling Neuro: Negative for headaches, weakness, numbness, dizziness, passing out/fainting Psych: Negative for depression, anxiety, memory problems     Objective:   Physical Exam BP 158/82 mmHg  Pulse 81  Temp(Src) 98.1 F (36.7 C)  Resp 16  Ht 5\' 7"  (1.702 m)  Wt 190 lb (86.183 kg)  BMI 29.75 kg/m2  SpO2 100%  General Appearance:    Alert, cooperative, no distress, appears stated age  Head:    Normocephalic, without obvious abnormality, atraumatic  Eyes:    PERRL, conjunctiva/corneas clear, EOM's intact both eyes  Ears:    Normal TM's and external ear canals, both  ears  Nose:   Nares normal, septum midline, mucosa normal, no drainage    or sinus tenderness  Throat:   Lips, mucosa, and tongue normal; teeth and gums normal  Neck:   Supple, symmetrical, trachea midline, no adenopathy;    thyroid:  no enlargement/tenderness/nodules.    Back:     Symmetric, no curvature, ROM normal, no CVA tenderness  Lungs:     Clear to auscultation bilaterally, respirations unlabored  Chest Wall:    No tenderness or deformity   Heart:    Regular rate and rhythm, S1 and S2 normal, no murmur, rub   or gallop  Breast Exam:    No tenderness, masses, or nipple abnormality  Abdomen:     Soft, non-tender, bowel sounds active all four quadrants,    no masses, no organomegaly  Genitalia:    Normal female without lesion, discharge or tenderness  Rectal:    Deferred  Extremities:   Extremities normal, atraumatic, no cyanosis or edema  Pulses:   2+ and symmetric all extremities  Skin:   Skin color, texture, turgor normal, no rashes. Large midline sternal keloid from healed surgical excision.   Lymph nodes:   Cervical, supraclavicular, and axillary nodes normal  Neurologic:   CNII-XII intact, normal strength, sensation and reflexes    throughout   Lab Results  Component Value Date   HGBA1C 6.20 12/10/2014    Assessment & Plan:

## 2014-12-10 NOTE — Patient Instructions (Addendum)
Mrs. Jane Arellano,  Thank you for coming in today. It was very nice to meet you.  1. Diabetes: well controlled Increases gabapentin for neuropathy from 100 mg three times daily to 200 mg TID for one week, then 300 mg TID. I have sent in 300 mg pills.  2. Ear wax: debrox peroxide drops to dissolve wax  3. Toe nail fungus: checking liver function. Will send in lamisil 250 mg daily for 12 weeks once labs reviewed.   4. Screening: schedule mammogram, you will be called about colonoscopy.   5. HTN: increase novasc from 5 mg to 10 mg daily, take 2 of the 5 mg tablets until out then start 10 mg tabs.   You will be called with lab results   F/u in 2 weeks with nurse for BP check F/u with Dr. Hyman HopesJegede in 2 months for HTN  Dr. Armen PickupFunches

## 2014-12-10 NOTE — Assessment & Plan Note (Signed)
A: well controlled diabetes with peripheral neuropathy Lab Results  Component Value Date   HGBA1C 6.20 12/10/2014   P:  Increase gabapentin to 300 mg TID, taper up

## 2014-12-10 NOTE — Assessment & Plan Note (Signed)
HIV, RPR, GC.chlam, wet prep done today

## 2014-12-10 NOTE — Progress Notes (Signed)
Patient here for annual exam Patient has trouble sleeping-[place] [facility] [place] [place] and says she is scared sometimes Patient did ask if she still needed to take Potassium pills-she is out

## 2014-12-10 NOTE — Assessment & Plan Note (Signed)
A; patient in NSR today, HR low normal, BP elevated Med: compliant P: Increase norvasc to 10 mg daily  Continue coreg 3.125 bid Continue lasix 20 daily  Close f/u with RN BP check in 2 weeks Back to PCP for continued management in 2 months If patient does not tolerate increase in Norvasc, I recommend back dose back to 5 mg daily and adding ACEi/ARB as tolerated. Additionally, patient on low dose lasix 20 mg daily, last ECHO 6/14, EF 60-65%, grade 1 diastolic dysfunction. There is no evidence of fluid overload on today's exam. I also recommend consider trial off lasix.

## 2014-12-10 NOTE — Assessment & Plan Note (Signed)
A: great and 2nd toe onychomycosis P: CMP, CBC Treat with 12 week course of lamisil if normal labs

## 2014-12-11 DIAGNOSIS — B9689 Other specified bacterial agents as the cause of diseases classified elsewhere: Secondary | ICD-10-CM | POA: Insufficient documentation

## 2014-12-11 DIAGNOSIS — N760 Acute vaginitis: Secondary | ICD-10-CM

## 2014-12-11 LAB — MICROALBUMIN / CREATININE URINE RATIO
CREATININE, URINE: 38.8 mg/dL
Microalb Creat Ratio: 25.8 mg/g (ref 0.0–30.0)
Microalb, Ur: 1 mg/dL (ref <2.0)

## 2014-12-11 LAB — CERVICOVAGINAL ANCILLARY ONLY
Chlamydia: NEGATIVE
Neisseria Gonorrhea: NEGATIVE
WET PREP (BD AFFIRM): NEGATIVE
Wet Prep (BD Affirm): NEGATIVE
Wet Prep (BD Affirm): POSITIVE — AB

## 2014-12-11 LAB — RPR

## 2014-12-11 LAB — HIV ANTIBODY (ROUTINE TESTING W REFLEX): HIV 1&2 Ab, 4th Generation: NONREACTIVE

## 2014-12-11 MED ORDER — METRONIDAZOLE 500 MG PO TABS: 500.0000 mg | ORAL_TABLET | Freq: Two times a day (BID) | ORAL | Status: DC

## 2014-12-11 MED ORDER — FLUCONAZOLE 150 MG PO TABS: 150.0000 mg | ORAL_TABLET | Freq: Once | ORAL | Status: DC

## 2014-12-11 NOTE — Assessment & Plan Note (Signed)
A: BV on wet prep P: Flagyl followed by diflucan    

## 2014-12-11 NOTE — Addendum Note (Signed)
Addended by: Dessa PhiFUNCHES, Kehinde Bowdish on: 12/11/2014 01:25 PM   Modules accepted: Orders

## 2014-12-15 ENCOUNTER — Telehealth: Payer: Self-pay | Admitting: *Deleted

## 2014-12-15 NOTE — Telephone Encounter (Signed)
-----   Message from Lora PaulaJosalyn C Funches, MD sent at 12/11/2014  1:23 PM EST ----- BV on wet prep. Tx with flagyl followed by diflucan  Normal labs. Otherwise normal labs.

## 2014-12-15 NOTE — Telephone Encounter (Signed)
Unable to contact Pt, wrong phone number

## 2014-12-15 NOTE — Telephone Encounter (Signed)
-----   Message from Lora PaulaJosalyn C Funches, MD sent at 12/11/2014  5:58 PM EST ----- Negative Gc/chlam

## 2014-12-29 ENCOUNTER — Ambulatory Visit: Payer: No Typology Code available for payment source | Attending: Internal Medicine | Admitting: *Deleted

## 2014-12-29 ENCOUNTER — Other Ambulatory Visit: Payer: Self-pay | Admitting: Family Medicine

## 2014-12-29 VITALS — BP 126/77 | HR 68 | Temp 98.1°F | Resp 18

## 2014-12-29 DIAGNOSIS — E119 Type 2 diabetes mellitus without complications: Secondary | ICD-10-CM | POA: Insufficient documentation

## 2014-12-29 DIAGNOSIS — I480 Paroxysmal atrial fibrillation: Secondary | ICD-10-CM | POA: Insufficient documentation

## 2014-12-29 DIAGNOSIS — I10 Essential (primary) hypertension: Secondary | ICD-10-CM

## 2014-12-29 NOTE — Progress Notes (Signed)
Patient presents for BP check Med list reviewed; states taking all meds as directed except ran out of lasix 3 days ago. Will pick up refill today. Discussed need for low sodium diet and using Mrs. Dash as alternative to salt Walking 20 min 3 days/week  BP 126/77 P 68 R 18  T  98.1 oral SPO2  100%  Patient advised to call for med refills at least 7 days before running out so as not to go without. Patient aware that she is to f/u with PCP 2 months from last visit (Due 02/08/15)

## 2014-12-29 NOTE — Patient Instructions (Signed)
DASH Eating Plan °DASH stands for "Dietary Approaches to Stop Hypertension." The DASH eating plan is a healthy eating plan that has been shown to reduce high blood pressure (hypertension). Additional health benefits may include reducing the risk of type 2 diabetes mellitus, heart disease, and stroke. The DASH eating plan may also help with weight loss. °WHAT DO I NEED TO KNOW ABOUT THE DASH EATING PLAN? °For the DASH eating plan, you will follow these general guidelines: °· Choose foods with a percent daily value for sodium of less than 5% (as listed on the food label). °· Use salt-free seasonings or herbs instead of table salt or sea salt. °· Check with your health care provider or pharmacist before using salt substitutes. °· Eat lower-sodium products, often labeled as "lower sodium" or "no salt added." °· Eat fresh foods. °· Eat more vegetables, fruits, and low-fat dairy products. °· Choose whole grains. Look for the word "whole" as the first word in the ingredient list. °· Choose fish and skinless chicken or turkey more often than red meat. Limit fish, poultry, and meat to 6 oz (170 g) each day. °· Limit sweets, desserts, sugars, and sugary drinks. °· Choose heart-healthy fats. °· Limit cheese to 1 oz (28 g) per day. °· Eat more home-cooked food and less restaurant, buffet, and fast food. °· Limit fried foods. °· Cook foods using methods other than frying. °· Limit canned vegetables. If you do use them, rinse them well to decrease the sodium. °· When eating at a restaurant, ask that your food be prepared with less salt, or no salt if possible. °WHAT FOODS CAN I EAT? °Seek help from a dietitian for individual calorie needs. °Grains °Whole grain or whole wheat bread. Brown rice. Whole grain or whole wheat pasta. Quinoa, bulgur, and whole grain cereals. Low-sodium cereals. Corn or whole wheat flour tortillas. Whole grain cornbread. Whole grain crackers. Low-sodium crackers. °Vegetables °Fresh or frozen vegetables  (raw, steamed, roasted, or grilled). Low-sodium or reduced-sodium tomato and vegetable juices. Low-sodium or reduced-sodium tomato sauce and paste. Low-sodium or reduced-sodium canned vegetables.  °Fruits °All fresh, canned (in natural juice), or frozen fruits. °Meat and Other Protein Products °Ground beef (85% or leaner), grass-fed beef, or beef trimmed of fat. Skinless chicken or turkey. Ground chicken or turkey. Pork trimmed of fat. All fish and seafood. Eggs. Dried beans, peas, or lentils. Unsalted nuts and seeds. Unsalted canned beans. °Dairy °Low-fat dairy products, such as skim or 1% milk, 2% or reduced-fat cheeses, low-fat ricotta or cottage cheese, or plain low-fat yogurt. Low-sodium or reduced-sodium cheeses. °Fats and Oils °Tub margarines without trans fats. Light or reduced-fat mayonnaise and salad dressings (reduced sodium). Avocado. Safflower, olive, or canola oils. Natural peanut or almond butter. °Other °Unsalted popcorn and pretzels. °The items listed above may not be a complete list of recommended foods or beverages. Contact your dietitian for more options. °WHAT FOODS ARE NOT RECOMMENDED? °Grains °White bread. White pasta. White rice. Refined cornbread. Bagels and croissants. Crackers that contain trans fat. °Vegetables °Creamed or fried vegetables. Vegetables in a cheese sauce. Regular canned vegetables. Regular canned tomato sauce and paste. Regular tomato and vegetable juices. °Fruits °Dried fruits. Canned fruit in light or heavy syrup. Fruit juice. °Meat and Other Protein Products °Fatty cuts of meat. Ribs, chicken wings, bacon, sausage, bologna, salami, chitterlings, fatback, hot dogs, bratwurst, and packaged luncheon meats. Salted nuts and seeds. Canned beans with salt. °Dairy °Whole or 2% milk, cream, half-and-half, and cream cheese. Whole-fat or sweetened yogurt. Full-fat   cheeses or blue cheese. Nondairy creamers and whipped toppings. Processed cheese, cheese spreads, or cheese  curds. °Condiments °Onion and garlic salt, seasoned salt, table salt, and sea salt. Canned and packaged gravies. Worcestershire sauce. Tartar sauce. Barbecue sauce. Teriyaki sauce. Soy sauce, including reduced sodium. Steak sauce. Fish sauce. Oyster sauce. Cocktail sauce. Horseradish. Ketchup and mustard. Meat flavorings and tenderizers. Bouillon cubes. Hot sauce. Tabasco sauce. Marinades. Taco seasonings. Relishes. °Fats and Oils °Butter, stick margarine, lard, shortening, ghee, and bacon fat. Coconut, palm kernel, or palm oils. Regular salad dressings. °Other °Pickles and olives. Salted popcorn and pretzels. °The items listed above may not be a complete list of foods and beverages to avoid. Contact your dietitian for more information. °WHERE CAN I FIND MORE INFORMATION? °National Heart, [employer], and [employer] Institute: www.nhlbi.nih.gov/health/health-topics/topics/dash/ °Document Released: 10/27/2011 Document Revised: 03/24/2014 Document Reviewed: 09/11/2013 °ExitCare® Patient Information ©2015 [employer]. This information is not intended to replace advice given to you by your health care provider. Make sure you discuss any questions you have with your health care provider. ° °

## 2015-01-16 ENCOUNTER — Telehealth: Payer: Self-pay | Admitting: Emergency Medicine

## 2015-01-16 ENCOUNTER — Telehealth: Payer: Self-pay | Admitting: General Practice

## 2015-01-16 NOTE — Telephone Encounter (Signed)
Patient was in for an OV on 12/10/14. Patient states that at the time of visit she spoke to nurse about issues with her toe (infection) and she was to have something prescribed. Patient wants to follow up. Please assist.  

## 2015-01-16 NOTE — Telephone Encounter (Signed)
Patient was in for an OV on 12/10/14. Patient states that at the time of visit she spoke to nurse about issues with her toe (infection) and she was to have something prescribed. Patient wants to follow up. Please assist.

## 2015-02-18 ENCOUNTER — Other Ambulatory Visit: Payer: Self-pay | Admitting: Internal Medicine

## 2015-02-18 ENCOUNTER — Other Ambulatory Visit: Payer: Self-pay

## 2015-02-18 DIAGNOSIS — Z1231 Encounter for screening mammogram for malignant neoplasm of breast: Secondary | ICD-10-CM

## 2015-02-23 ENCOUNTER — Ambulatory Visit (INDEPENDENT_AMBULATORY_CARE_PROVIDER_SITE_OTHER): Payer: No Typology Code available for payment source | Admitting: Internal Medicine

## 2015-02-23 ENCOUNTER — Encounter: Payer: Self-pay | Admitting: Internal Medicine

## 2015-02-23 VITALS — BP 138/96 | HR 72 | Ht 67.0 in | Wt 198.8 lb

## 2015-02-23 DIAGNOSIS — I480 Paroxysmal atrial fibrillation: Secondary | ICD-10-CM

## 2015-02-23 NOTE — Progress Notes (Signed)
[name]      Patient Care Team: Tresa Garter, MD as PCP - General (Internal Medicine)   HPI  Jane Arellano is a 54 y.o. female Seen in followup for recurrent atrial fibrillation for which he underwent cardioversion in early December.  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   She's had no atrial fibrillation in the last few months. He continues to have discomfort at the site of her incision. There is a significant keloid.    Past Medical History  Diagnosis Date  . Atypical chest pain     a. 11/2005 Negative Myoview  . Subaortic membrane     a. 01/2010 Echo: EF 55-60%, No rwma, subaortic membrane with elevated LVOT mean gradient of 21 mmHg, Triv AI, mod dil LA, mildly increased PASP. b. 05/02/2013 Echo:  LVEF 53-74%, grade 1 diastolic dysfunction, mild LVH, subaortic stenosis w/ turbulation in LVOT c/w subaortic membrane (mean gradient 42 mmHg/peak gradient 81 mmHg), mild biatrial enlargement  . GERD (gastroesophageal reflux disease)   . HTN (hypertension)   . Iron deficiency anemia   . History of substance abuse   . Paroxysmal atrial fibrillation 05/01/2013    On Xarelto  . Diastolic dysfunction   . Heart murmur   . DM2 (diabetes mellitus, type 2)   . Dysrhythmia     AFIB HX CV     Past Surgical History  Procedure Laterality Date  . Cesarean section    . Cardioversion N/A 11/04/2013    Procedure: CARDIOVERSION;  Surgeon: Peter M Martinique, MD;  Location: [facility] ENDOSCOPY;  Service: Cardiovascular;  Laterality: N/A;    Current Outpatient Prescriptions  Medication Sig Dispense Refill  . acetaminophen (TYLENOL) 500 MG tablet Take 1,000 mg by mouth every 6 (six) hours as needed for mild pain.    Marland Kitchen acetaminophen-codeine (TYLENOL #3) 300-30 MG per tablet Take 1 tablet by mouth every 4 (four) hours as needed. 60 tablet 0  . amLODipine (NORVASC) 10 MG tablet Take 1 tablet (10 mg total) by mouth daily. 30 tablet 2  . antiseptic oral rinse  (BIOTENE) LIQD 15 mLs by Mouth Rinse route 2 (two) times daily as needed for dry mouth.     . Calcium Carb-Cholecalciferol (CALCIUM-VITAMIN D3) 500-400 MG-UNIT TABS Take 1 tablet by mouth daily.    . carbamide peroxide (DEBROX) 6.5 % otic solution Place 5 drops into both ears 2 (two) times daily. 15 mL 0  . carvedilol (COREG) 3.125 MG tablet Take 1 tablet (3.125 mg total) by mouth 2 (two) times daily. 180 tablet 3  . dabigatran (PRADAXA) 150 MG CAPS capsule Take 1 capsule (150 mg total) by mouth 2 (two) times daily. 60 capsule 3  . famotidine (PEPCID) 20 MG tablet Take 1 tablet (20 mg total) by mouth 2 (two) times daily. 180 tablet 3  . ferrous sulfate 325 (65 FE) MG tablet Take 1 tablet (325 mg total) by mouth 3 (three) times daily with meals.    . fish oil-omega-3 fatty acids 1000 MG capsule Take 1 g by mouth daily.    . furosemide (LASIX) 20 MG tablet Take 1 tablet (20 mg total) by mouth daily. 90 tablet 3  . gabapentin (NEURONTIN) 300 MG capsule Take 1 capsule (300 mg total) by mouth 3 (three) times daily. 90 capsule 1  . glucose monitoring kit (FREESTYLE) monitoring kit 1 each by Does not apply route as needed for other. 1 each 0  . Lancets (FREESTYLE) lancets Use as  instructed 100 each 12  . metFORMIN (GLUCOPHAGE) 500 MG tablet Take 1 tablet (500 mg total) by mouth 2 (two) times daily with a meal. 180 tablet 3  . Multiple Vitamin (MULTIVITAMIN WITH MINERALS) TABS tablet Take 1 tablet by mouth daily.    . polyethylene glycol powder (GLYCOLAX/MIRALAX) powder Take 17 g by mouth daily. 850 g 1  . TRUETEST TEST test strip USE TO TEST BLOOD SUGARS AS DIRECTED BY PHYSICIAN 100 each 12  . [DISCONTINUED] potassium chloride (K-DUR,KLOR-CON) 10 MEQ tablet Take 1 tablet (10 mEq total) by mouth daily. 30 tablet 0   No current facility-administered medications for this visit.    Allergies  Allergen Reactions  . Ketorolac Tromethamine Other (See Comments)    Unknown reaction    Review of Systems  negative except from HPI and PMH  Physical Exam BP 138/96 mmHg  Pulse 72  Ht 5' 7" (1.702 m)  Wt 198 lb 12.8 oz (90.175 kg)  BMI 31.13 kg/m2 Well developed and well nourished in no acute distress HENT normal E scleral and icterus clear Neck Supple JVP flat; carotids brisk and full Clear to ausculation Keloid along the sternal incision regular rate and rhythm, 2/6 harsh systolic murmur with a split S2 Soft with active bowel sounds No clubbing cyanosis no  Edema Alert and oriented, grossly normal motor and sensory function Skin Warm and Dry  ECG demonstrates sinus rhythm at 72 Intervals 15/09/43 Poor R-wave progression Nonspecific ST changes   new compared to ECG 9/15 Assessment and  Plan  Atrial fibrillation-paroxysmal  Aortic-subaortic membrane status post resection  Keloid  Hypertension not too bad  No recurrent palpitations  Continue current meds

## 2015-02-23 NOTE — Patient Instructions (Signed)
Your physician recommends that you continue on your current medications as directed. Please refer to the Current Medication list given to you today.  Your physician wants you to follow-up in: 6 months with Rudi Cocoonna Carroll, NP.  You will receive a reminder letter in the mail two months in advance. If you don't receive a letter, please call our office to schedule the follow-up appointment.

## 2015-02-25 ENCOUNTER — Ambulatory Visit
Admission: RE | Admit: 2015-02-25 | Discharge: 2015-02-25 | Disposition: A | Discharge status: Home / Self Care | Payer: No Typology Code available for payment source | Source: Ambulatory Visit

## 2015-02-25 DIAGNOSIS — Z1231 Encounter for screening mammogram for malignant neoplasm of breast: Secondary | ICD-10-CM

## 2015-02-26 ENCOUNTER — Ambulatory Visit: Payer: No Typology Code available for payment source | Admitting: Internal Medicine

## 2015-03-16 ENCOUNTER — Ambulatory Visit: Payer: Self-pay | Attending: Internal Medicine | Admitting: Internal Medicine

## 2015-03-16 ENCOUNTER — Encounter: Payer: Self-pay | Admitting: Internal Medicine

## 2015-03-16 VITALS — BP 137/83 | HR 77 | Temp 98.7°F | Resp 16 | Ht 67.0 in | Wt 198.0 lb

## 2015-03-16 DIAGNOSIS — Z1211 Encounter for screening for malignant neoplasm of colon: Secondary | ICD-10-CM

## 2015-03-16 DIAGNOSIS — E118 Type 2 diabetes mellitus with unspecified complications: Secondary | ICD-10-CM | POA: Insufficient documentation

## 2015-03-16 DIAGNOSIS — E119 Type 2 diabetes mellitus without complications: Secondary | ICD-10-CM

## 2015-03-16 DIAGNOSIS — B351 Tinea unguium: Secondary | ICD-10-CM

## 2015-03-16 DIAGNOSIS — I10 Essential (primary) hypertension: Secondary | ICD-10-CM

## 2015-03-16 LAB — GLUCOSE, POCT (MANUAL RESULT ENTRY): POC Glucose: 125 mg/dl — AB (ref 70–99)

## 2015-03-16 LAB — POCT GLYCOSYLATED HEMOGLOBIN (HGB A1C): Hemoglobin A1C: 6.8

## 2015-03-16 MED ORDER — GABAPENTIN 300 MG PO CAPS: 300.0000 mg | ORAL_CAPSULE | Freq: Three times a day (TID) | ORAL | Status: DC

## 2015-03-16 MED ORDER — AMLODIPINE BESYLATE 10 MG PO TABS: 10.0000 mg | ORAL_TABLET | Freq: Every day | ORAL | Status: DC

## 2015-03-16 MED ORDER — TERBINAFINE HCL 250 MG PO TABS: 250.0000 mg | ORAL_TABLET | Freq: Every day | ORAL | Status: DC

## 2015-03-16 NOTE — Progress Notes (Signed)
Pt is here following up on her HTN and diabetes. Pt states that she has occasional pain in her feet. Pt reports having fungus in her toenails and she is using an OTC medication that seems to be working.

## 2015-03-16 NOTE — Patient Instructions (Signed)
DASH Eating Plan DASH stands for "Dietary Approaches to Stop Hypertension." The DASH eating plan is a healthy eating plan that has been shown to reduce high blood pressure (hypertension). Additional health benefits may include reducing the risk of type 2 diabetes mellitus, heart disease, and stroke. The DASH eating plan may also help with weight loss. WHAT DO I NEED TO KNOW ABOUT THE DASH EATING PLAN? For the DASH eating plan, you will follow these general guidelines:  Choose foods with a percent daily value for sodium of less than 5% (as listed on the food label).  Use salt-free seasonings or herbs instead of table salt or sea salt.  Check with your health care provider or pharmacist before using salt substitutes.  Eat lower-sodium products, often labeled as "lower sodium" or "no salt added."  Eat fresh foods.  Eat more vegetables, fruits, and low-fat dairy products.  Choose whole grains. Look for the word "whole" as the first word in the ingredient list.  Choose fish and skinless chicken or turkey more often than red meat. Limit fish, poultry, and meat to 6 oz (170 g) each day.  Limit sweets, desserts, sugars, and sugary drinks.  Choose heart-healthy fats.  Limit cheese to 1 oz (28 g) per day.  Eat more home-cooked food and less restaurant, buffet, and fast food.  Limit fried foods.  Cook foods using methods other than frying.  Limit canned vegetables. If you do use them, rinse them well to decrease the sodium.  When eating at a restaurant, ask that your food be prepared with less salt, or no salt if possible. WHAT FOODS CAN I EAT? Seek help from a dietitian for individual calorie needs. Grains Whole grain or whole wheat bread. Brown rice. Whole grain or whole wheat pasta. Quinoa, bulgur, and whole grain cereals. Low-sodium cereals. Corn or whole wheat flour tortillas. Whole grain cornbread. Whole grain crackers. Low-sodium crackers. Vegetables Fresh or frozen vegetables  (raw, steamed, roasted, or grilled). Low-sodium or reduced-sodium tomato and vegetable juices. Low-sodium or reduced-sodium tomato sauce and paste. Low-sodium or reduced-sodium canned vegetables.  Fruits All fresh, canned (in natural juice), or frozen fruits. Meat and Other Protein Products Ground beef (85% or leaner), grass-fed beef, or beef trimmed of fat. Skinless chicken or turkey. Ground chicken or turkey. Pork trimmed of fat. All fish and seafood. Eggs. Dried beans, peas, or lentils. Unsalted nuts and seeds. Unsalted canned beans. Dairy Low-fat dairy products, such as skim or 1% milk, 2% or reduced-fat cheeses, low-fat ricotta or cottage cheese, or plain low-fat yogurt. Low-sodium or reduced-sodium cheeses. Fats and Oils Tub margarines without trans fats. Light or reduced-fat mayonnaise and salad dressings (reduced sodium). Avocado. Safflower, olive, or canola oils. Natural peanut or almond butter. Other Unsalted popcorn and pretzels. The items listed above may not be a complete list of recommended foods or beverages. Contact your dietitian for more options. WHAT FOODS ARE NOT RECOMMENDED? Grains White bread. White pasta. White rice. Refined cornbread. Bagels and croissants. Crackers that contain trans fat. Vegetables Creamed or fried vegetables. Vegetables in a cheese sauce. Regular canned vegetables. Regular canned tomato sauce and paste. Regular tomato and vegetable juices. Fruits Dried fruits. Canned fruit in light or heavy syrup. Fruit juice. Meat and Other Protein Products Fatty cuts of meat. Ribs, chicken wings, bacon, sausage, bologna, salami, chitterlings, fatback, hot dogs, bratwurst, and packaged luncheon meats. Salted nuts and seeds. Canned beans with salt. Dairy Whole or 2% milk, cream, half-and-half, and cream cheese. Whole-fat or sweetened yogurt. Full-fat   cheeses or blue cheese. Nondairy creamers and whipped toppings. Processed cheese, cheese spreads, or cheese  curds. Condiments Onion and garlic salt, seasoned salt, table salt, and sea salt. Canned and packaged gravies. Worcestershire sauce. Tartar sauce. Barbecue sauce. Teriyaki sauce. Soy sauce, including reduced sodium. Steak sauce. Fish sauce. Oyster sauce. Cocktail sauce. Horseradish. Ketchup and mustard. Meat flavorings and tenderizers. Bouillon cubes. Hot sauce. Tabasco sauce. Marinades. Taco seasonings. Relishes. Fats and Oils Butter, stick margarine, lard, shortening, ghee, and bacon fat. Coconut, palm kernel, or palm oils. Regular salad dressings. Other Pickles and olives. Salted popcorn and pretzels. The items listed above may not be a complete list of foods and beverages to avoid. Contact your dietitian for more information. WHERE CAN I FIND MORE INFORMATION? National Heart, Lung, and [employer] Institute: travelstabloid.com Document Released: 10/27/2011 Document Revised: 03/24/2014 Document Reviewed: 09/11/2013 [facility] Patient Information 2015 [place], [place]. This information is not intended to replace advice given to you by your health care provider. Make sure you discuss any questions you have with your health care provider. Basic Carbohydrate Counting for Diabetes Mellitus Carbohydrate counting is a method for keeping track of the amount of carbohydrates you eat. Eating carbohydrates naturally increases the level of sugar (glucose) in your blood, so it is important for you to know the amount that is okay for you to have in every meal. Carbohydrate counting helps keep the level of glucose in your blood within normal limits. The amount of carbohydrates allowed is different for every person. A dietitian can help you calculate the amount that is right for you. Once you know the amount of carbohydrates you can have, you can count the carbohydrates in the foods you want to eat. Carbohydrates are found in the following foods:  Grains, such as breads and  cereals.  Dried beans and soy products.  Starchy vegetables, such as potatoes, peas, and corn.  Fruit and fruit juices.  Milk and yogurt.  Sweets and snack foods, such as cake, cookies, candy, chips, soft drinks, and fruit drinks. CARBOHYDRATE COUNTING There are two ways to count the carbohydrates in your food. You can use either of the methods or a combination of both. Reading the "Nutrition Facts" on [place] The "Nutrition Facts" is an area that is included on the labels of almost all packaged food and beverages in the [place]. It includes the serving size of that food or beverage and information about the nutrients in each serving of the food, including the grams (g) of carbohydrate per serving.  Decide the number of servings of this food or beverage that you will be able to eat or drink. Multiply that number of servings by the number of grams of carbohydrate that is listed on the label for that serving. The total will be the amount of carbohydrates you will be having when you eat or drink this food or beverage. Learning Standard Serving Sizes of Food When you eat food that is not packaged or does not include "Nutrition Facts" on the label, you need to measure the servings in order to count the amount of carbohydrates.A serving of most carbohydrate-rich foods contains about 15 g of carbohydrates. The following list includes serving sizes of carbohydrate-rich foods that provide 15 g ofcarbohydrate per serving:   1 slice of bread (1 oz) or 1 six-inch tortilla.    of a hamburger bun or English muffin.  4-6 crackers.   cup unsweetened dry cereal.    cup hot cereal.   cup rice or pasta.  cup mashed potatoes or  of a large baked potato.  1 cup fresh fruit or one small piece of fruit.    cup canned or frozen fruit or fruit juice.  1 cup milk.   cup plain fat-free yogurt or yogurt sweetened with artificial sweeteners.   cup cooked dried beans or starchy  vegetable, such as peas, corn, or potatoes.  Decide the number of standard-size servings that you will eat. Multiply that number of servings by 15 (the grams of carbohydrates in that serving). For example, if you eat 2 cups of strawberries, you will have eaten 2 servings and 30 g of carbohydrates (2 servings x 15 g = 30 g). For foods such as soups and casseroles, in which more than one food is mixed in, you will need to count the carbohydrates in each food that is included. EXAMPLE OF CARBOHYDRATE COUNTING Sample Dinner  3 oz chicken breast.   cup of brown rice.   cup of corn.  1 cup milk.   1 cup strawberries with sugar-free whipped topping.  Carbohydrate Calculation Step 1: Identify the foods that contain carbohydrates:   Rice.   Corn.   Milk.   Strawberries. Step 2:Calculate the number of servings eaten of each:   2 servings of rice.   1 serving of corn.   1 serving of milk.   1 serving of strawberries. Step 3: Multiply each of those number of servings by 15 g:   2 servings of rice x 15 g = 30 g.   1 serving of corn x 15 g = 15 g.   1 serving of milk x 15 g = 15 g.   1 serving of strawberries x 15 g = 15 g. Step 4: Add together all of the amounts to find the total grams of carbohydrates eaten: 30 g + 15 g + 15 g + 15 g = 75 g. Document Released: 11/07/2005 Document Revised: 03/24/2014 Document Reviewed: 10/04/2013 [employer] Patient Information 2015 [employer]. This information is not intended to replace advice given to you by your health care provider. Make sure you discuss any questions you have with your health care provider. Diabetes and Exercise Exercising regularly is important. It is not just about losing weight. It has many health benefits, such as:  Improving your overall fitness, flexibility, and endurance.  Increasing your bone density.  Helping with weight control.  Decreasing your body fat.  Increasing your muscle  strength.  Reducing stress and tension.  Improving your overall health. People with diabetes who exercise gain additional benefits because exercise:  Reduces appetite.  Improves the body's use of blood sugar (glucose).  Helps lower or control blood glucose.  Decreases blood pressure.  Helps control blood lipids (such as cholesterol and triglycerides).  Improves the body's use of the hormone insulin by:  Increasing the body's insulin sensitivity.  Reducing the body's insulin needs.  Decreases the risk for heart disease because exercising:  Lowers cholesterol and triglycerides levels.  Increases the levels of good cholesterol (such as high-density lipoproteins [HDL]) in the body.  Lowers blood glucose levels. YOUR ACTIVITY PLAN  Choose an activity that you enjoy and set realistic goals. Your health care provider or diabetes educator can help you make an activity plan that works for you. Exercise regularly as directed by your health care provider. This includes:  Performing resistance training twice a week such as push-ups, sit-ups, lifting weights, or using resistance bands.  Performing 150 minutes of cardio exercises each week   such as walking, running, or playing sports.  Staying active and spending no more than 90 minutes at one time being inactive. Even short bursts of exercise are good for you. Three 10-minute sessions spread throughout the day are just as beneficial as a single 30-minute session. Some exercise ideas include:  Taking the dog for a walk.  Taking the stairs instead of the elevator.  Dancing to your favorite song.  Doing an exercise video.  Doing your favorite exercise with a friend. RECOMMENDATIONS FOR EXERCISING WITH TYPE 1 OR TYPE 2 DIABETES   Check your blood glucose before exercising. If blood glucose levels are greater than 240 mg/dL, check for urine ketones. Do not exercise if ketones are present.  Avoid injecting insulin into areas of the  body that are going to be exercised. For example, avoid injecting insulin into:  The arms when playing tennis.  The legs when jogging.  Keep a record of:  Food intake before and after you exercise.  Expected peak times of insulin action.  Blood glucose levels before and after you exercise.  The type and amount of exercise you have done.  Review your records with your health care provider. Your health care provider will help you to develop guidelines for adjusting food intake and insulin amounts before and after exercising.  If you take insulin or oral hypoglycemic agents, watch for signs and symptoms of hypoglycemia. They include:  Dizziness.  Shaking.  Sweating.  Chills.  Confusion.  Drink plenty of water while you exercise to prevent dehydration or heat stroke. Body water is lost during exercise and must be replaced.  Talk to your health care provider before starting an exercise program to make sure it is safe for you. Remember, almost any type of activity is better than none. Document Released: 01/28/2004 Document Revised: 03/24/2014 Document Reviewed: 04/16/2013 [facility] Patient Information 2015 [place], [place]. This information is not intended to replace advice given to you by your health care provider. Make sure you discuss any questions you have with your health care provider. Hypertension Hypertension, commonly called high blood pressure, is when the force of blood pumping through your arteries is too strong. Your arteries are the blood vessels that carry blood from your heart throughout your body. A blood pressure reading consists of a higher number over a lower number, such as 110/72. The higher number (systolic) is the pressure inside your arteries when your heart pumps. The lower number (diastolic) is the pressure inside your arteries when your heart relaxes. Ideally you want your blood pressure below 120/80. Hypertension forces your heart to work harder to pump  blood. Your arteries may become narrow or stiff. Having hypertension puts you at risk for heart disease, stroke, and other problems.  RISK FACTORS Some risk factors for high blood pressure are controllable. Others are not.  Risk factors you cannot control include:   Race. You may be at higher risk if you are African American.  Age. Risk increases with age.  Gender. Men are at higher risk than women before age 55 years. After age 55, women are at higher risk than men. Risk factors you can control include:  Not getting enough exercise or physical activity.  Being overweight.  Getting too much fat, sugar, calories, or salt in your diet.  Drinking too much alcohol. SIGNS AND SYMPTOMS Hypertension does not usually cause signs or symptoms. Extremely high blood pressure (hypertensive crisis) may cause headache, anxiety, shortness of breath, and nosebleed. DIAGNOSIS  To check if you  have hypertension, your health care provider will measure your blood pressure while you are seated, with your arm held at the level of your heart. It should be measured at least twice using the same arm. Certain conditions can cause a difference in blood pressure between your right and left arms. A blood pressure reading that is higher than normal on one occasion does not mean that you need treatment. If one blood pressure reading is high, ask your health care provider about having it checked again. TREATMENT  Treating high blood pressure includes making lifestyle changes and possibly taking medicine. Living a healthy lifestyle can help lower high blood pressure. You may need to change some of your habits. Lifestyle changes may include:  Following the DASH diet. This diet is high in fruits, vegetables, and whole grains. It is low in salt, red meat, and added sugars.  Getting at least 2 hours of brisk physical activity every week.  Losing weight if necessary.  Not smoking.  Limiting alcoholic  beverages.  Learning ways to reduce stress. If lifestyle changes are not enough to get your blood pressure under control, your health care provider may prescribe medicine. You may need to take more than one. Work closely with your health care provider to understand the risks and benefits. HOME CARE INSTRUCTIONS  Have your blood pressure rechecked as directed by your health care provider.   Take medicines only as directed by your health care provider. Follow the directions carefully. Blood pressure medicines must be taken as prescribed. The medicine does not work as well when you skip doses. Skipping doses also puts you at risk for problems.   Do not smoke.   Monitor your blood pressure at home as directed by your health care provider. SEEK MEDICAL CARE IF:   You think you are having a reaction to medicines taken.  You have recurrent headaches or feel dizzy.  You have swelling in your ankles.  You have trouble with your vision. SEEK IMMEDIATE MEDICAL CARE IF:  You develop a severe headache or confusion.  You have unusual weakness, numbness, or feel faint.  You have severe chest or abdominal pain.  You vomit repeatedly.  You have trouble breathing. MAKE SURE YOU:   Understand these instructions.  Will watch your condition.  Will get help right away if you are not doing well or get worse. Document Released: 11/07/2005 Document Revised: 03/24/2014 Document Reviewed: 08/30/2013 [facility] Patient Information 2015 [place], [place]. This information is not intended to replace advice given to you by your health care provider. Make sure you discuss any questions you have with your health care provider.

## 2015-03-16 NOTE — Progress Notes (Signed)
Patient ID: Jane Arellano, female   DOB: 1961-06-25, 54 y.o.   MRN: [id number]   Jane Arellano, is a 54 y.o. female  LNL:[id number]  EYC:[id number]  DOB - 05-06-1961  Chief Complaint  Patient presents with  . Follow-up        Subjective:   Jane Arellano is a 54 y.o. female here today for a follow up visit. Patient has history of hypertension, diabetes mellitus, diastolic dysfunction, subaortic membrane status post resection in 2015, paroxysmal atrial fibrillation on Xarelto, GERD, history of substance abuse, and iron deficiency anemia. She follows up regularly with cardiologist. She is here today for routine follow-up of hypertension and diabetes. She complains of occasional pain in her feet., Face like the dorsum pain. Patient also has some fungal infection of her toenails for which she has been using over-the-counter medication that seems to be working but not fast enough. Patient has No headache, No chest pain, No abdominal pain - No Nausea, No new weakness tingling or numbness, No Cough - SOB.  Problem  Type 2 Diabetes Mellitus Without Complication  Essential Hypertension    ALLERGIES: Allergies  Allergen Reactions  . Ketorolac Tromethamine Other (See Comments)    Unknown reaction    PAST MEDICAL HISTORY: Past Medical History  Diagnosis Date  . Atypical chest pain     a. 11/2005 Negative Myoview  . Subaortic membrane     a. 01/2010 Echo: EF 55-60%, No rwma, subaortic membrane with elevated LVOT mean gradient of 21 mmHg, Triv AI, mod dil LA, mildly increased PASP. b. 05/02/2013 Echo:  LVEF 14-97%, grade 1 diastolic dysfunction, mild LVH, subaortic stenosis w/ turbulation in LVOT c/w subaortic membrane (mean gradient 42 mmHg/peak gradient 81 mmHg), mild biatrial enlargement  . GERD (gastroesophageal reflux disease)   . HTN (hypertension)   . Iron deficiency anemia   . History of substance abuse   . Paroxysmal atrial fibrillation 05/01/2013    On Xarelto  . Diastolic  dysfunction   . Heart murmur   . DM2 (diabetes mellitus, type 2)   . Dysrhythmia     AFIB HX CV     MEDICATIONS AT HOME: Prior to Admission medications   Medication Sig Start Date End Date Taking? Authorizing Provider  acetaminophen (TYLENOL) 500 MG tablet Take 1,000 mg by mouth every 6 (six) hours as needed for mild pain.   Yes Historical Provider, MD  amLODipine (NORVASC) 10 MG tablet Take 1 tablet (10 mg total) by mouth daily. 03/16/15  Yes Tresa Garter, MD  antiseptic oral rinse (BIOTENE) LIQD 15 mLs by Mouth Rinse route 2 (two) times daily as needed for dry mouth.    Yes Historical Provider, MD  Calcium Carb-Cholecalciferol (CALCIUM-VITAMIN D3) 500-400 MG-UNIT TABS Take 1 tablet by mouth daily.   Yes Historical Provider, MD  carbamide peroxide (DEBROX) 6.5 % otic solution Place 5 drops into both ears 2 (two) times daily. 12/10/14  Yes Josalyn Funches, MD  carvedilol (COREG) 3.125 MG tablet Take 1 tablet (3.125 mg total) by mouth 2 (two) times daily. 07/24/14  Yes Deboraha Sprang, MD  dabigatran (PRADAXA) 150 MG CAPS capsule Take 1 capsule (150 mg total) by mouth 2 (two) times daily. 07/25/14  Yes Deboraha Sprang, MD  famotidine (PEPCID) 20 MG tablet Take 1 tablet (20 mg total) by mouth 2 (two) times daily. 08/14/14  Yes Tresa Garter, MD  ferrous sulfate 325 (65 FE) MG tablet Take 1 tablet (325 mg total) by mouth 3 (three) times daily  with meals. 07/31/13  Yes Liliane Shi, PA-C  fish oil-omega-3 fatty acids 1000 MG capsule Take 1 g by mouth daily.   Yes Historical Provider, MD  furosemide (LASIX) 20 MG tablet Take 1 tablet (20 mg total) by mouth daily. 08/14/14  Yes Tresa Garter, MD  gabapentin (NEURONTIN) 300 MG capsule Take 1 capsule (300 mg total) by mouth 3 (three) times daily. 03/16/15  Yes Tresa Garter, MD  glucose monitoring kit (FREESTYLE) monitoring kit 1 each by Does not apply route as needed for other. 05/09/13  Yes Barton Dubois, MD  Lancets (FREESTYLE)  lancets Use as instructed 10/03/13  Yes Tresa Garter, MD  metFORMIN (GLUCOPHAGE) 500 MG tablet Take 1 tablet (500 mg total) by mouth 2 (two) times daily with a meal. 08/14/14  Yes Tresa Garter, MD  Multiple Vitamin (MULTIVITAMIN WITH MINERALS) TABS tablet Take 1 tablet by mouth daily.   Yes Historical Provider, MD  polyethylene glycol powder (GLYCOLAX/MIRALAX) powder Take 17 g by mouth daily. 03/31/14  Yes Lance Bosch, NP  TRUETEST TEST test strip USE TO TEST BLOOD SUGARS AS DIRECTED BY PHYSICIAN 02/20/15  Yes Tresa Garter, MD  acetaminophen-codeine (TYLENOL #3) 300-30 MG per tablet Take 1 tablet by mouth every 4 (four) hours as needed. Patient not taking: Reported on 03/16/2015 07/07/14   Tresa Garter, MD  terbinafine (LAMISIL) 250 MG tablet Take 1 tablet (250 mg total) by mouth daily. 03/16/15   Tresa Garter, MD     Objective:   Filed Vitals:   03/16/15 1434  BP: 137/83  Pulse: 77  Temp: 98.7 F (37.1 C)  TempSrc: Oral  Resp: 16  Height: _0  (1.702 m)  Weight: 198 lb (89.812 kg)  SpO2: 98%    Exam General appearance : Awake, alert, not in any distress. Speech Clear. Not toxic looking HEENT: Atraumatic and Normocephalic, pupils equally reactive to light and accomodation Neck: supple, no JVD. No cervical lymphadenopathy.  Chest:Good air entry bilaterally, no added sounds  CVS: S1 S2 regular, no murmurs.  Abdomen: Bowel sounds present, Non tender and not distended with no gaurding, rigidity or rebound. Extremities: B/L Lower Ext shows no edema, both legs are warm to touch Neurology: Awake alert, and oriented X 3, CN II-XII intact, Non focal  Data Review Lab Results  Component Value Date   HGBA1C 6.80 03/16/2015   HGBA1C 6.20 12/10/2014   HGBA1C 6.2 07/07/2014     Assessment & Plan   1. Type 2 diabetes mellitus without complication  - Glucose (CBG) - HgB A1c reviewed, discussed with patient - gabapentin (NEURONTIN) 300 MG capsule; Take  1 capsule (300 mg total) by mouth 3 (three) times daily.  Dispense: 90 capsule; Refill: 3  Aim for 30 minutes of exercise most days. Rethink what you drink. Water is great! Aim for 2-3 Carb Choices per meal (30-45 grams) +/- 1 either way  Aim for 0-15 Carbs per snack if hungry  Include protein in moderation with your meals and snacks  Consider reading food labels for Total Carbohydrate and Fat Grams of foods  Consider checking BG at alternate times per day  Continue taking medication as directed Be mindful about how much sugar you are adding to beverages and other foods. Fruit Punch - find one with no sugar  Measure and decrease portions of carbohydrate foods  Make your plate and don't go back for seconds   2. Essential hypertension: Controlled  - amLODipine (NORVASC) 10 MG tablet;  Take 1 tablet (10 mg total) by mouth daily.  Dispense: 90 tablet; Refill: 3  We have discussed target BP range and blood pressure goal. I have advised patient to check BP regularly and to call us back or report to clinic if the numbers are consistently higher than 140/90. We discussed the importance of compliance with medical therapy and DASH diet recommended, consequences of uncontrolled hypertension discussed.  - continue current BP medications  3. Onychomycosis of toenail  - terbinafine (LAMISIL) 250 MG tablet; Take 1 tablet (250 mg total) by mouth daily.  Dispense: 90 tablet; Refill: 3  4. Colon cancer screening  - [facility] COLONOSCOPY - Ambulatory referral to Gastroenterology  Patient have been counseled extensively about nutrition and exercise Return in about 3 months (around 06/15/2015), or if symptoms worsen or fail to improve, for Hemoglobin A1C and Follow up, DM, Follow up HTN.  The patient was given clear instructions to go to ER or return to medical center if symptoms don't improve, worsen or new problems develop. The patient verbalized understanding. The patient was told to call to get lab  results if they haven't heard anything in the next week.   This note has been created with Surveyor, quantity. Any transcriptional errors are unintentional.    Angelica Chessman, MD, [place], [place], [place], [place] and [place], [place]   03/16/2015, 3:24 PM

## 2015-06-17 ENCOUNTER — Telehealth: Payer: Self-pay | Admitting: Internal Medicine

## 2015-06-17 ENCOUNTER — Encounter (HOSPITAL_COMMUNITY): Payer: Self-pay | Admitting: *Deleted

## 2015-06-17 ENCOUNTER — Emergency Department (HOSPITAL_COMMUNITY)
Admission: EM | Admit: 2015-06-17 | Discharge: 2015-06-17 | Disposition: A | Discharge status: Home / Self Care | Payer: No Typology Code available for payment source | Attending: Emergency Medicine | Admitting: Emergency Medicine

## 2015-06-17 ENCOUNTER — Emergency Department (HOSPITAL_COMMUNITY): Payer: No Typology Code available for payment source

## 2015-06-17 DIAGNOSIS — R011 Cardiac murmur, unspecified: Secondary | ICD-10-CM | POA: Insufficient documentation

## 2015-06-17 DIAGNOSIS — D509 Iron deficiency anemia, unspecified: Secondary | ICD-10-CM | POA: Insufficient documentation

## 2015-06-17 DIAGNOSIS — I480 Paroxysmal atrial fibrillation: Secondary | ICD-10-CM | POA: Insufficient documentation

## 2015-06-17 DIAGNOSIS — R079 Chest pain, unspecified: Secondary | ICD-10-CM | POA: Insufficient documentation

## 2015-06-17 DIAGNOSIS — I10 Essential (primary) hypertension: Secondary | ICD-10-CM | POA: Insufficient documentation

## 2015-06-17 DIAGNOSIS — K219 Gastro-esophageal reflux disease without esophagitis: Secondary | ICD-10-CM | POA: Insufficient documentation

## 2015-06-17 DIAGNOSIS — R51 Headache: Secondary | ICD-10-CM | POA: Insufficient documentation

## 2015-06-17 DIAGNOSIS — Q244 Congenital subaortic stenosis: Secondary | ICD-10-CM | POA: Insufficient documentation

## 2015-06-17 DIAGNOSIS — E119 Type 2 diabetes mellitus without complications: Secondary | ICD-10-CM | POA: Insufficient documentation

## 2015-06-17 LAB — BASIC METABOLIC PANEL
Anion gap: 13 (ref 5–15)
BUN: 11 mg/dL (ref 6–20)
CO2: 21 mmol/L — ABNORMAL LOW (ref 22–32)
Calcium: 9.7 mg/dL (ref 8.9–10.3)
Chloride: 104 mmol/L (ref 101–111)
Creatinine, Ser: 0.72 mg/dL (ref 0.44–1.00)
GFR calc Af Amer: >60 mL/min (ref >60)
GFR calc non Af Amer: >60 mL/min (ref >60)
GLUCOSE: 133 mg/dL — AB (ref 65–99)
POTASSIUM: 3.6 mmol/L (ref 3.5–5.1)
Sodium: 138 mmol/L (ref 135–145)

## 2015-06-17 LAB — CBC
HCT: 39.5 % (ref 36.0–46.0)
HEMOGLOBIN: 13.4 g/dL (ref 12.0–15.0)
MCH: 33.3 pg (ref 26.0–34.0)
MCHC: 33.9 g/dL (ref 30.0–36.0)
MCV: 98.3 fL (ref 78.0–100.0)
Platelets: 173 10*3/uL (ref 150–400)
RBC: 4.02 MIL/uL (ref 3.87–5.11)
RDW: 13.1 % (ref 11.5–15.5)
WBC: 5.2 10*3/uL (ref 4.0–10.5)

## 2015-06-17 LAB — I-STAT TROPONIN, ED
Troponin i, poc: 0 ng/mL (ref 0.00–0.08)
Troponin i, poc: 0 ng/mL (ref 0.00–0.08)

## 2015-06-17 NOTE — Telephone Encounter (Signed)
Patient called stating that she has been having headaches and dizziness, she would like some advice on what to do next. Please f/u with pt.

## 2015-06-17 NOTE — ED Notes (Signed)
Pt reports recent headache and sharp chest pains. Denies sob or cough. ekg being done, airway intact.

## 2015-06-17 NOTE — ED Provider Notes (Signed)
CSN: [id number]     Arrival date & time 06/17/15  1046 History   First MD Initiated Contact with Patient 06/17/15 1136     Chief Complaint  Patient presents with  . Chest Pain  . Headache     (Consider location/radiation/quality/duration/timing/severity/associated sxs/prior Treatment) HPI Complains of frontal headache intermittent onset 2 days ago last 1 minute time and goes away for goes away for several minutes, also complains of left anterior chest pain lasting approximately 1 minute a time. Headache and chest pain are brought on by stress. Nonexertional. No focal numbness or weakness. She is presently pain-free. No shortness of breath no nausea no sweatiness no fever. Symptoms are nonexertional. No other associated symptoms. Treated with Tylenol with relief. Past Medical History  Diagnosis Date  . Atypical chest pain     a. 11/2005 Negative Myoview  . Subaortic membrane     a. 01/2010 Echo: EF 55-60%, No rwma, subaortic membrane with elevated LVOT mean gradient of 21 mmHg, Triv AI, mod dil LA, mildly increased PASP. b. 05/02/2013 Echo:  LVEF 26-41%, grade 1 diastolic dysfunction, mild LVH, subaortic stenosis w/ turbulation in LVOT c/w subaortic membrane (mean gradient 42 mmHg/peak gradient 81 mmHg), mild biatrial enlargement  . GERD (gastroesophageal reflux disease)   . HTN (hypertension)   . Iron deficiency anemia   . History of substance abuse   . Paroxysmal atrial fibrillation 05/01/2013    On Xarelto  . Diastolic dysfunction   . Heart murmur   . DM2 (diabetes mellitus, type 2)   . Dysrhythmia     AFIB HX CV    Past Surgical History  Procedure Laterality Date  . Cesarean section    . Cardioversion N/A 11/04/2013    Procedure: CARDIOVERSION;  Surgeon: Peter M Martinique, MD;  Location: [facility] ENDOSCOPY;  Service: Cardiovascular;  Laterality: N/A;   Family History  Problem Relation Age of Onset  . Hypertension Sister     alive and well  . Diabetes Father     deceased  . Cancer  Mother 68    colon  . Stroke Mother   . Hypertension Sister     alive and well  . Heart attack Brother     deceased @ 34   History  Substance Use Topics  . Smoking status: Never Smoker   . Smokeless tobacco: Current User    Types: Snuff  . Alcohol Use: No     Comment: Drinks 3 - 40 oz beers daily.  i QUIT DOING THAT "   OB History    No data available     Review of Systems  Constitutional: Negative.   Respiratory: Negative.   Cardiovascular: Positive for chest pain.  Gastrointestinal: Negative.   Musculoskeletal: Negative.   Skin: Negative.   Neurological: Positive for headaches.  Psychiatric/Behavioral: Negative.   All other systems reviewed and are negative.     Allergies  Ketorolac tromethamine  Home Medications   Prior to Admission medications   Medication Sig Start Date End Date Taking? Authorizing Provider  acetaminophen (TYLENOL) 500 MG tablet Take 1,000 mg by mouth every 6 (six) hours as needed for mild pain.    Historical Provider, MD  acetaminophen-codeine (TYLENOL #3) 300-30 MG per tablet Take 1 tablet by mouth every 4 (four) hours as needed. Patient not taking: Reported on 03/16/2015 07/07/14   Tresa Garter, MD  amLODipine (NORVASC) 10 MG tablet Take 1 tablet (10 mg total) by mouth daily. 03/16/15   Tresa Garter, MD  antiseptic oral rinse (BIOTENE) LIQD 15 mLs by Mouth Rinse route 2 (two) times daily as needed for dry mouth.     Historical Provider, MD  Calcium Carb-Cholecalciferol (CALCIUM-VITAMIN D3) 500-400 MG-UNIT TABS Take 1 tablet by mouth daily.    Historical Provider, MD  carbamide peroxide (DEBROX) 6.5 % otic solution Place 5 drops into both ears 2 (two) times daily. 12/10/14   Josalyn Funches, MD  carvedilol (COREG) 3.125 MG tablet Take 1 tablet (3.125 mg total) by mouth 2 (two) times daily. 07/24/14   Steven C Klein, MD  dabigatran (PRADAXA) 150 MG CAPS capsule Take 1 capsule (150 mg total) by mouth 2 (two) times daily. 07/25/14   Steven C  Klein, MD  famotidine (PEPCID) 20 MG tablet Take 1 tablet (20 mg total) by mouth 2 (two) times daily. 08/14/14   Olugbemiga E Jegede, MD  ferrous sulfate 325 (65 FE) MG tablet Take 1 tablet (325 mg total) by mouth 3 (three) times daily with meals. 07/31/13   Scott T Weaver, PA-C  fish oil-omega-3 fatty acids 1000 MG capsule Take 1 g by mouth daily.    Historical Provider, MD  furosemide (LASIX) 20 MG tablet Take 1 tablet (20 mg total) by mouth daily. 08/14/14   Olugbemiga E Jegede, MD  gabapentin (NEURONTIN) 300 MG capsule Take 1 capsule (300 mg total) by mouth 3 (three) times daily. 03/16/15   Olugbemiga E Jegede, MD  glucose monitoring kit (FREESTYLE) monitoring kit 1 each by Does not apply route as needed for other. 05/09/13   Carlos Madera, MD  Lancets (FREESTYLE) lancets Use as instructed 10/03/13   Olugbemiga E Jegede, MD  metFORMIN (GLUCOPHAGE) 500 MG tablet Take 1 tablet (500 mg total) by mouth 2 (two) times daily with a meal. 08/14/14   Olugbemiga E Jegede, MD  Multiple Vitamin (MULTIVITAMIN WITH MINERALS) TABS tablet Take 1 tablet by mouth daily.    Historical Provider, MD  polyethylene glycol powder (GLYCOLAX/MIRALAX) powder Take 17 g by mouth daily. 03/31/14   Valerie A Keck, NP  terbinafine (LAMISIL) 250 MG tablet Take 1 tablet (250 mg total) by mouth daily. 03/16/15   Olugbemiga E Jegede, MD  TRUETEST TEST test strip USE TO TEST BLOOD SUGARS AS DIRECTED BY PHYSICIAN 02/20/15   Olugbemiga E Jegede, MD   BP 168/82 mmHg  Pulse 76  Temp(Src) 98.3 F (36.8 C) (Oral)  Resp 20  Ht 5' 6.5" (1.689 m)  Wt 197 lb 8 oz (89.585 kg)  BMI 31.40 kg/m2  SpO2 100% Physical Exam  Constitutional: She appears well-developed and well-nourished.  HENT:  Head: Normocephalic and atraumatic.  Eyes: Conjunctivae are normal. Pupils are equal, round, and reactive to light.  Neck: Neck supple. No tracheal deviation present. No thyromegaly present.  Cardiovascular: Normal rate and regular rhythm.   No murmur  heard. Pulmonary/Chest: Effort normal and breath sounds normal.  Abdominal: Soft. Bowel sounds are normal. She exhibits no distension. There is no tenderness.  Musculoskeletal: Normal range of motion. She exhibits no edema or tenderness.  Neurological: She is alert. She has normal reflexes. No cranial nerve deficit. Coordination normal.  DTRs symmetric bilaterally knee jerk ankle jerk biceps was ordered bilaterally gait normal Romberg normal pronator drift normal finger to nose normal  Skin: Skin is warm and dry. No rash noted.  Psychiatric: She has a normal mood and affect.  Nursing note and vitals reviewed.   ED Course  Procedures (including critical care time) Labs Review Labs Reviewed  BASIC METABOLIC PANEL    CBC    Imaging Review Dg Chest 2 View  06/17/2015   CLINICAL DATA:  Atrial fibrillation  EXAM: CHEST - 2 VIEW  COMPARISON:  04/23/2014  FINDINGS: Cardiac shadow is stable. Postoperative changes are again seen. The lungs are clear bilaterally. No acute bony abnormality is seen.  IMPRESSION: No active disease.   Electronically Signed   By: Inez Catalina M.D.   On: 06/17/2015 12:03     EKG Interpretation   Date/Time:  Wednesday June 17 2015 11:26:13 EDT Ventricular Rate:  60 PR Interval:  158 QRS Duration: 84 QT Interval:  452 QTC Calculation: 452 R Axis:   19 Text Interpretation:  Normal sinus rhythm Septal infarct , age  undetermined Abnormal ECG Confirmed by Winfred Leeds  MD, Selda Jalbert [phone]) on  06/17/2015 11:49:59 AM      Results for orders placed or performed during the hospital encounter of 33/00/76  Basic metabolic panel  Result Value Ref Range   Sodium 138 135 - 145 mmol/L   Potassium 3.6 3.5 - 5.1 mmol/L   Chloride 104 101 - 111 mmol/L   CO2 21 (L) 22 - 32 mmol/L   Glucose, Bld 133 (H) 65 - 99 mg/dL   BUN 11 6 - 20 mg/dL   Creatinine, Ser 0.72 0.44 - 1.00 mg/dL   Calcium 9.7 8.9 - 10.3 mg/dL   GFR calc non Af Amer >60 >60 mL/min   GFR calc Af Amer >60 >60  mL/min   Anion gap 13 5 - 15  CBC  Result Value Ref Range   WBC 5.2 4.0 - 10.5 K/uL   RBC 4.02 3.87 - 5.11 MIL/uL   Hemoglobin 13.4 12.0 - 15.0 g/dL   HCT 39.5 36.0 - 46.0 %   MCV 98.3 78.0 - 100.0 fL   MCH 33.3 26.0 - 34.0 pg   MCHC 33.9 30.0 - 36.0 g/dL   RDW 13.1 11.5 - 15.5 %   Platelets 173 150 - 400 K/uL  I-stat troponin, ED  Result Value Ref Range   Troponin i, poc 0.00 0.00 - 0.08 ng/mL   Comment 3          I-stat troponin, ED  Result Value Ref Range   Troponin i, poc 0.00 0.00 - 0.08 ng/mL   Comment 3           Dg Chest 2 View  06/17/2015   CLINICAL DATA:  Atrial fibrillation  EXAM: CHEST - 2 VIEW  COMPARISON:  04/23/2014  FINDINGS: Cardiac shadow is stable. Postoperative changes are again seen. The lungs are clear bilaterally. No acute bony abnormality is seen.  IMPRESSION: No active disease.   Electronically Signed   By: Inez Catalina M.D.   On: 06/17/2015 12:03    chest reviewed by me 4:45 PM patient asymptomatic, pain-free. Alert and ambulatory no distress MDM  Heart score equals 3. Nonacute EKG Symptoms highly atypical for acute coronary syndrome. Risk factors include family history diabetes hypertension I don't feel the patient needs imaging of her head. Headache is all spontaneously and headache is fleeting lasting only a short while and intermittent. Strongly doubt intracranial bleed. No focal neurologic deficit. Plan follow-up with [place] as needed Diagnosis #1 nonspecific chest pain #2 nonspecific headache Final diagnoses:  None        Orlie Dakin, MD 06/17/15 1657

## 2015-06-17 NOTE — Discharge Instructions (Signed)
Chest Pain (Nonspecific) Contact [facility] tomorrow to schedule a follow-up office visit for your chest pain and headache. Tell office staff that you were seen here today. It is often hard to give a specific diagnosis for the cause of chest pain. There is always a chance that your pain could be related to something serious, such as a heart attack or a blood clot in the lungs. You need to follow up with your health care provider for further evaluation. CAUSES   Heartburn.  Pneumonia or bronchitis.  Anxiety or stress.  Inflammation around your heart (pericarditis) or lung (pleuritis or pleurisy).  A blood clot in the lung.  A collapsed lung (pneumothorax). It can develop suddenly on its own (spontaneous pneumothorax) or from trauma to the chest.  Shingles infection (herpes zoster virus). The chest wall is composed of bones, muscles, and cartilage. Any of these can be the source of the pain.  The bones can be bruised by injury.  The muscles or cartilage can be strained by coughing or overwork.  The cartilage can be affected by inflammation and become sore (costochondritis). DIAGNOSIS  Lab tests or other studies may be needed to find the cause of your pain. Your health care provider may have you take a test called an ambulatory electrocardiogram (ECG). An ECG records your heartbeat patterns over a 24-hour period. You may also have other tests, such as:  Transthoracic echocardiogram (TTE). During echocardiography, sound waves are used to evaluate how blood flows through your heart.  Transesophageal echocardiogram (TEE).  Cardiac monitoring. This allows your health care provider to monitor your heart rate and rhythm in real time.  Holter monitor. This is a portable device that records your heartbeat and can help diagnose heart arrhythmias. It allows your health care provider to track your heart activity for several days, if needed.  Stress tests by  exercise or by giving medicine that makes the heart beat faster. TREATMENT   Treatment depends on what may be causing your chest pain. Treatment may include:  Acid blockers for heartburn.  Anti-inflammatory medicine.  Pain medicine for inflammatory conditions.  Antibiotics if an infection is present.  You may be advised to change lifestyle habits. This includes stopping smoking and avoiding alcohol, caffeine, and chocolate.  You may be advised to keep your head raised (elevated) when sleeping. This reduces the chance of acid going backward from your stomach into your esophagus. Most of the time, nonspecific chest pain will improve within 2-3 days with rest and mild pain medicine.  HOME CARE INSTRUCTIONS   If antibiotics were prescribed, take them as directed. Finish them even if you start to feel better.  For the next few days, avoid physical activities that bring on chest pain. Continue physical activities as directed.  Do not use any tobacco products, including cigarettes, chewing tobacco, or electronic cigarettes.  Avoid drinking alcohol.  Only take medicine as directed by your health care provider.  Follow your health care provider's suggestions for further testing if your chest pain does not go away.  Keep any follow-up appointments you made. If you do not go to an appointment, you could develop lasting (chronic) problems with pain. If there is any problem keeping an appointment, call to reschedule. SEEK MEDICAL CARE IF:   Your chest pain does not go away, even after treatment.  You have a rash with blisters on your chest.  You have a fever. SEEK IMMEDIATE MEDICAL CARE IF:   You have  increased chest pain or pain that spreads to your arm, neck, jaw, back, or abdomen.  You have shortness of breath.  You have an increasing cough, or you cough up blood.  You have severe back or abdominal pain.  You feel nauseous or vomit.  You have severe weakness.  You  faint.  You have chills. This is an emergency. Do not wait to see if the pain will go away. Get medical help at once. Call your local emergency services (911 in [place] Do not drive yourself to the hospital. MAKE SURE YOU:   Understand these instructions.  Will watch your condition.  Will get help right away if you are not doing well or get worse. Document Released: 08/17/2005 Document Revised: 11/12/2013 Document Reviewed: 06/12/2008 [facility] Patient Information 2015 [place], [place]. This information is not intended to replace advice given to you by your health care provider. Make sure you discuss any questions you have with your health care provider.

## 2015-06-21 ENCOUNTER — Emergency Department (HOSPITAL_COMMUNITY)
Admission: EM | Admit: 2015-06-21 | Discharge: 2015-06-21 | Disposition: A | Discharge status: Home / Self Care | Payer: Self-pay | Attending: Emergency Medicine | Admitting: Emergency Medicine

## 2015-06-21 ENCOUNTER — Encounter (HOSPITAL_COMMUNITY): Payer: Self-pay | Admitting: Emergency Medicine

## 2015-06-21 ENCOUNTER — Emergency Department (HOSPITAL_COMMUNITY): Payer: No Typology Code available for payment source

## 2015-06-21 DIAGNOSIS — Z862 Personal history of diseases of the blood and blood-forming organs and certain disorders involving the immune mechanism: Secondary | ICD-10-CM | POA: Insufficient documentation

## 2015-06-21 DIAGNOSIS — E119 Type 2 diabetes mellitus without complications: Secondary | ICD-10-CM | POA: Insufficient documentation

## 2015-06-21 DIAGNOSIS — I480 Paroxysmal atrial fibrillation: Secondary | ICD-10-CM | POA: Insufficient documentation

## 2015-06-21 DIAGNOSIS — R200 Anesthesia of skin: Secondary | ICD-10-CM | POA: Insufficient documentation

## 2015-06-21 DIAGNOSIS — G43909 Migraine, unspecified, not intractable, without status migrainosus: Secondary | ICD-10-CM | POA: Insufficient documentation

## 2015-06-21 DIAGNOSIS — G43109 Migraine with aura, not intractable, without status migrainosus: Secondary | ICD-10-CM

## 2015-06-21 DIAGNOSIS — R011 Cardiac murmur, unspecified: Secondary | ICD-10-CM | POA: Insufficient documentation

## 2015-06-21 DIAGNOSIS — I10 Essential (primary) hypertension: Secondary | ICD-10-CM | POA: Insufficient documentation

## 2015-06-21 DIAGNOSIS — R519 Headache, unspecified: Secondary | ICD-10-CM

## 2015-06-21 DIAGNOSIS — R079 Chest pain, unspecified: Secondary | ICD-10-CM | POA: Insufficient documentation

## 2015-06-21 DIAGNOSIS — Z7901 Long term (current) use of anticoagulants: Secondary | ICD-10-CM | POA: Insufficient documentation

## 2015-06-21 DIAGNOSIS — R51 Headache: Secondary | ICD-10-CM

## 2015-06-21 DIAGNOSIS — I5030 Unspecified diastolic (congestive) heart failure: Secondary | ICD-10-CM | POA: Insufficient documentation

## 2015-06-21 DIAGNOSIS — Z8774 Personal history of (corrected) congenital malformations of heart and circulatory system: Secondary | ICD-10-CM | POA: Insufficient documentation

## 2015-06-21 DIAGNOSIS — Z8719 Personal history of other diseases of the digestive system: Secondary | ICD-10-CM | POA: Insufficient documentation

## 2015-06-21 LAB — CBC
HEMATOCRIT: 38.5 % (ref 36.0–46.0)
Hemoglobin: 12.9 g/dL (ref 12.0–15.0)
MCH: 32.9 pg (ref 26.0–34.0)
MCHC: 33.5 g/dL (ref 30.0–36.0)
MCV: 98.2 fL (ref 78.0–100.0)
Platelets: 179 10*3/uL (ref 150–400)
RBC: 3.92 MIL/uL (ref 3.87–5.11)
RDW: 13.3 % (ref 11.5–15.5)
WBC: 4.4 10*3/uL (ref 4.0–10.5)

## 2015-06-21 LAB — BASIC METABOLIC PANEL
Anion gap: 12 (ref 5–15)
BUN: 10 mg/dL (ref 6–20)
CALCIUM: 10.6 mg/dL — AB (ref 8.9–10.3)
CO2: 26 mmol/L (ref 22–32)
Chloride: 101 mmol/L (ref 101–111)
Creatinine, Ser: 0.86 mg/dL (ref 0.44–1.00)
Glucose, Bld: 120 mg/dL — ABNORMAL HIGH (ref 65–99)
POTASSIUM: 3.5 mmol/L (ref 3.5–5.1)
Sodium: 139 mmol/L (ref 135–145)

## 2015-06-21 LAB — I-STAT TROPONIN, ED: Troponin i, poc: 0 ng/mL (ref 0.00–0.08)

## 2015-06-21 MED ORDER — SODIUM CHLORIDE 0.9 % IV BOLUS (SEPSIS)
1000.0000 mL | Freq: Once | INTRAVENOUS | Status: AC
  Administered 2015-06-21: 1000 mL via INTRAVENOUS

## 2015-06-21 MED ORDER — DIPHENHYDRAMINE HCL 50 MG/ML IJ SOLN
12.5000 mg | Freq: Once | INTRAMUSCULAR | Status: AC
  Administered 2015-06-21: 12.5 mg via INTRAVENOUS
  Filled 2015-06-21: qty 1

## 2015-06-21 MED ORDER — PROCHLORPERAZINE EDISYLATE 5 MG/ML IJ SOLN
10.0000 mg | Freq: Four times a day (QID) | INTRAMUSCULAR | Status: DC | PRN
  Filled 2015-06-21: qty 2

## 2015-06-21 MED ORDER — LORAZEPAM 2 MG/ML IJ SOLN: 1.0000 mg | Freq: Once | INTRAMUSCULAR | Status: DC

## 2015-06-21 MED ORDER — PROCHLORPERAZINE EDISYLATE 5 MG/ML IJ SOLN
10.0000 mg | Freq: Once | INTRAMUSCULAR | Status: AC
  Administered 2015-06-21: 10 mg via INTRAVENOUS

## 2015-06-21 NOTE — ED Provider Notes (Signed)
The patient is a 54 year old female, she has a history of intermittent headaches, she is currently having a headache with pain in her left arm, numbness and tingling, on exam she has normal strength and sensation in all 4 extremities, cranial nerves III through XII are normal, mild bradycardia but otherwise no abnormal findings. MRI has been ordered to rule out stroke versus comp located migraine. The patient has improved completely with the headache cocktail which was ordered. I anticipate discharge if MRI is normal.  I saw and evaluated the patient, reviewed the resident's note and I agree with the findings and plan.   EKG Interpretation  Date/Time:  Sunday June 21 2015 17:20:59 EDT Ventricular Rate:  79 PR Interval:  144 QRS Duration: 80 QT Interval:  420 QTC Calculation: 481 R Axis:   43 Text Interpretation:  Normal sinus rhythm Nonspecific ST abnormality Abnormal ECG since last tracing no significant change Confirmed by Raney Antwine  MD, Pharoah Goggins ([id number]) on 06/21/2015 7:39:16 PM      I personally interpreted the EKG as well as the resident and agree with the interpretation on the resident's chart.  Final diagnoses:  Acute nonintractable headache, unspecified headache type  Complicated migraine      Eber Hong, MD 06/22/15 [phone]

## 2015-06-21 NOTE — ED Notes (Signed)
Pt denies chest pain, arm pain or numbness at this time.pt states that these are intermittent and began on the 27th.
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
## 2015-06-21 NOTE — ED Notes (Signed)
Pt remains in MRI 

## 2015-06-21 NOTE — ED Notes (Signed)
MD at bedside. 

## 2015-06-21 NOTE — ED Notes (Signed)
Pt reports seen here few days ago for chest pain. Pt reports having pain in her face and shooting pain down her left arm and left leg. Pt denies any other symptoms.

## 2015-06-21 NOTE — Discharge Instructions (Signed)

## 2015-06-29 ENCOUNTER — Encounter: Payer: Self-pay | Admitting: Internal Medicine

## 2015-06-29 ENCOUNTER — Ambulatory Visit: Payer: Self-pay | Attending: Internal Medicine | Admitting: Internal Medicine

## 2015-06-29 VITALS — BP 138/88 | HR 47 | Temp 97.8°F | Resp 18 | Ht 67.0 in | Wt 199.0 lb

## 2015-06-29 DIAGNOSIS — M542 Cervicalgia: Secondary | ICD-10-CM | POA: Insufficient documentation

## 2015-06-29 DIAGNOSIS — Z79899 Other long term (current) drug therapy: Secondary | ICD-10-CM | POA: Insufficient documentation

## 2015-06-29 DIAGNOSIS — I10 Essential (primary) hypertension: Secondary | ICD-10-CM | POA: Insufficient documentation

## 2015-06-29 DIAGNOSIS — F418 Other specified anxiety disorders: Secondary | ICD-10-CM | POA: Insufficient documentation

## 2015-06-29 DIAGNOSIS — K219 Gastro-esophageal reflux disease without esophagitis: Secondary | ICD-10-CM | POA: Insufficient documentation

## 2015-06-29 DIAGNOSIS — E119 Type 2 diabetes mellitus without complications: Secondary | ICD-10-CM

## 2015-06-29 DIAGNOSIS — E1140 Type 2 diabetes mellitus with diabetic neuropathy, unspecified: Secondary | ICD-10-CM | POA: Insufficient documentation

## 2015-06-29 LAB — POCT GLYCOSYLATED HEMOGLOBIN (HGB A1C): Hemoglobin A1C: 6.5

## 2015-06-29 LAB — GLUCOSE, POCT (MANUAL RESULT ENTRY): POC Glucose: 182 mg/dl — AB (ref 70–99)

## 2015-06-29 IMAGING — CR DG CHEST 1V PORT
1 series · 1 of 1 positions shown · non-contrast
Comparison: DG CHEST 1V PORT dated 03/19/2014;

CLINICAL DATA: Post resection of sub-aortic membrane

EXAM:
PORTABLE CHEST - 1 VIEW

[AP]
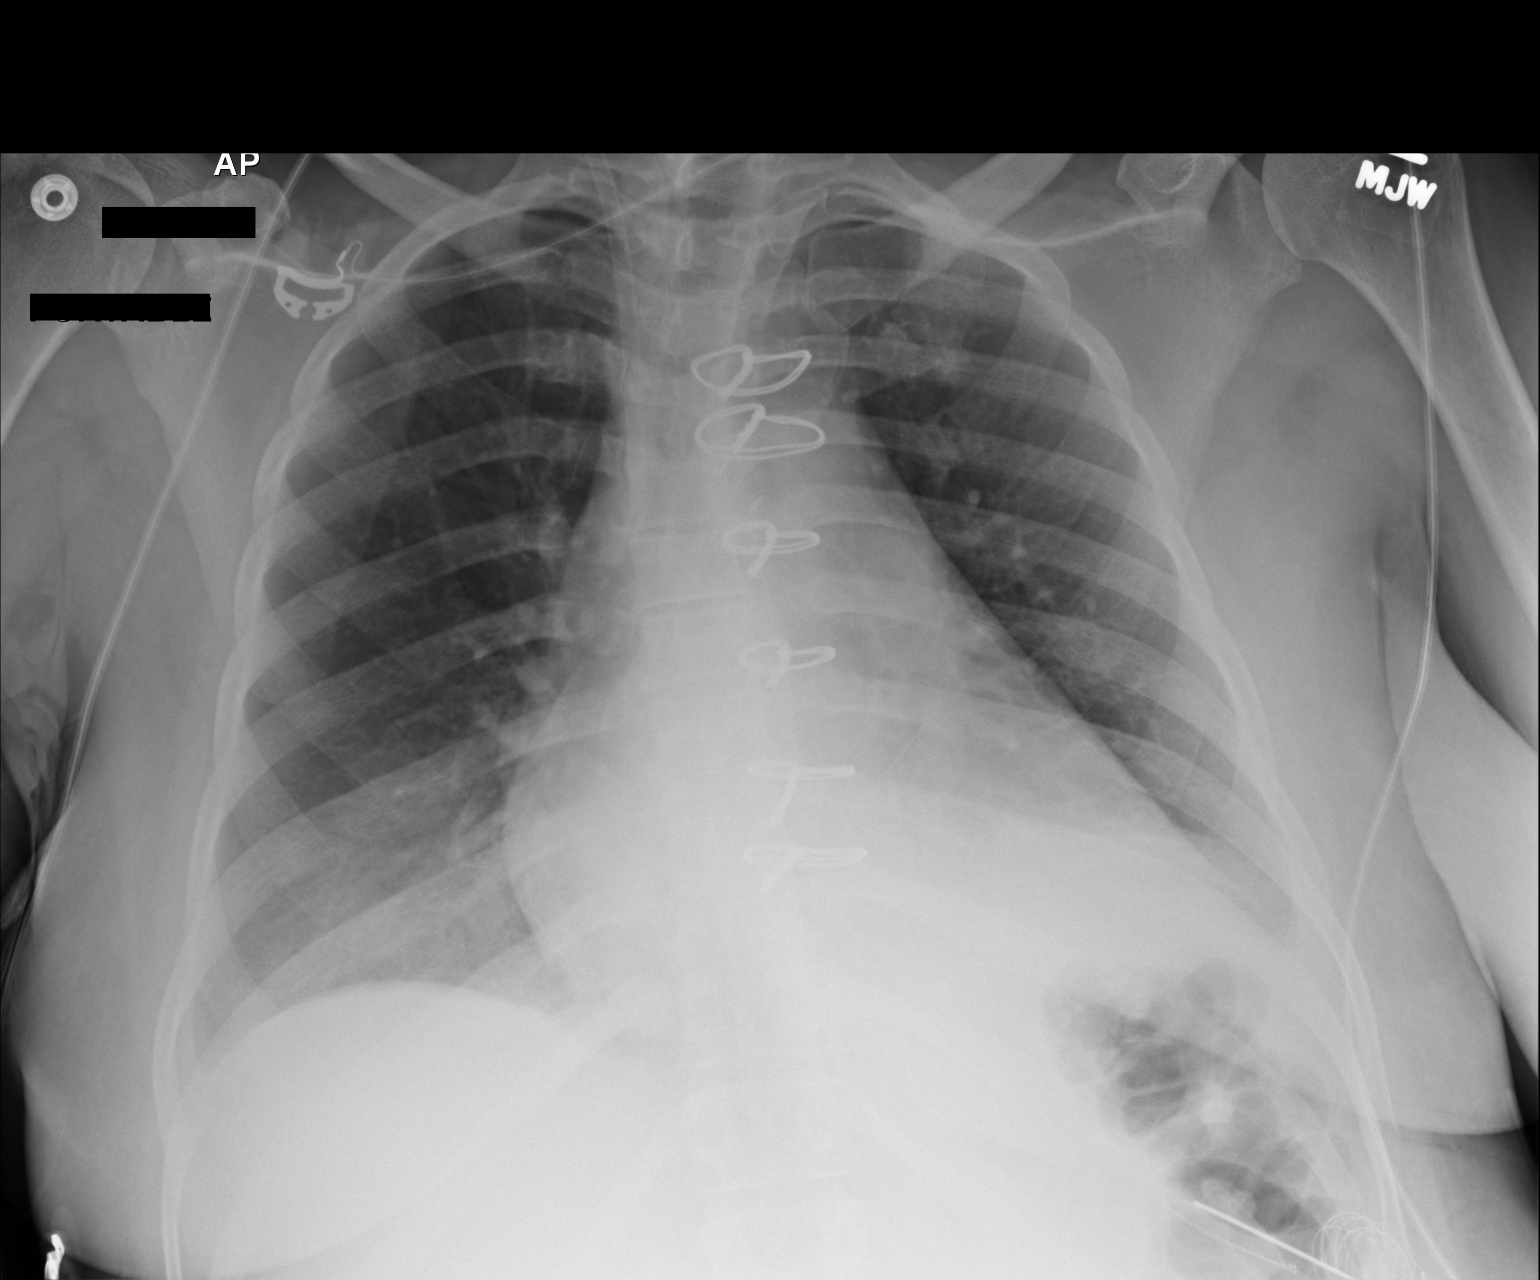

[1 of 1 positions shown; findings below may reference images not displayed]

DG CHEST 2 VIEW dated
03/14/2014; DG CHEST 1V PORT dated 03/18/2014; DG CHEST 2 VIEW dated
05/06/2013; CT HEART MORP W/CTA COR W/SCORE W/CA W/CM &/OR WO/CM
dated 11/08/2013
FINDINGS: Grossly unchanged borderline enlarged cardiac silhouette and
mediastinal contours post median sternotomy. Interval removal of PA
catheter with remaining vascular sheath tip overlying the mid/distal
aspect of the SVC. Interval removal of midline mediastinal drain. No
pneumothorax. Mild cephalization of flow without frank evidence of
edema. Trace bilateral effusions are suspected. Unchanged bibasilar
heterogeneous opacities, left greater than right. No new focal
airspace opacities. Unchanged bones.
IMPRESSION: 1. Interval removal of support apparatus as above.  No pneumothorax.
2. Pulmonary venous congestion without frank evidence of edema.
3. Unchanged small bilateral effusions with associated bibasilar
opacities, left greater than right, likely atelectasis.

## 2015-06-29 MED ORDER — ACETAMINOPHEN-CODEINE #3 300-30 MG PO TABS: 1.0000 | ORAL_TABLET | ORAL | Status: DC | PRN

## 2015-06-29 MED ORDER — HYDROXYZINE HCL 10 MG PO TABS: 10.0000 mg | ORAL_TABLET | Freq: Three times a day (TID) | ORAL | Status: DC | PRN
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
## 2015-06-29 NOTE — Progress Notes (Signed)
CBG is 182.   Patient here for follow up. Hospital visit on 07/27/ and 07/31 for pain in left arm and headaches. Pt request something for anxiety to help her sleep. Pt reports diabetic nerve pain in feet is getting worse.

## 2015-06-29 NOTE — Patient Instructions (Signed)
Hypertension Hypertension, commonly called high blood pressure, is when the force of blood pumping through your arteries is too strong. Your arteries are the blood vessels that carry blood from your heart throughout your body. A blood pressure reading consists of a higher number over a lower number, such as 110/72. The higher number (systolic) is the pressure inside your arteries when your heart pumps. The lower number (diastolic) is the pressure inside your arteries when your heart relaxes. Ideally you want your blood pressure below 120/80. Hypertension forces your heart to work harder to pump blood. Your arteries may become narrow or stiff. Having hypertension puts you at risk for heart disease, stroke, and other problems.  RISK FACTORS Some risk factors for high blood pressure are controllable. Others are not.  Risk factors you cannot control include:   Race. You may be at higher risk if you are African American.  Age. Risk increases with age.  Gender. Men are at higher risk than women before age 45 years. After age 65, women are at higher risk than men. Risk factors you can control include:  Not getting enough exercise or physical activity.  Being overweight.  Getting too much fat, sugar, calories, or salt in your diet.  Drinking too much alcohol. SIGNS AND SYMPTOMS Hypertension does not usually cause signs or symptoms. Extremely high blood pressure (hypertensive crisis) may cause headache, anxiety, shortness of breath, and nosebleed. DIAGNOSIS  To check if you have hypertension, your health care provider will measure your blood pressure while you are seated, with your arm held at the level of your heart. It should be measured at least twice using the same arm. Certain conditions can cause a difference in blood pressure between your right and left arms. A blood pressure reading that is higher than normal on one occasion does not mean that you need treatment. If one blood pressure reading  is high, ask your health care provider about having it checked again. TREATMENT  Treating high blood pressure includes making lifestyle changes and possibly taking medicine. Living a healthy lifestyle can help lower high blood pressure. You may need to change some of your habits. Lifestyle changes may include:  Following the DASH diet. This diet is high in fruits, vegetables, and whole grains. It is low in salt, red meat, and added sugars.  Getting at least 2 hours of brisk physical activity every week.  Losing weight if necessary.  Not smoking.  Limiting alcoholic beverages.  Learning ways to reduce stress. If lifestyle changes are not enough to get your blood pressure under control, your health care provider may prescribe medicine. You may need to take more than one. Work closely with your health care provider to understand the risks and benefits. HOME CARE INSTRUCTIONS  Have your blood pressure rechecked as directed by your health care provider.   Take medicines only as directed by your health care provider. Follow the directions carefully. Blood pressure medicines must be taken as prescribed. The medicine does not work as well when you skip doses. Skipping doses also puts you at risk for problems.   Do not smoke.   Monitor your blood pressure at home as directed by your health care provider. SEEK MEDICAL CARE IF:   You think you are having a reaction to medicines taken.  You have recurrent headaches or feel dizzy.  You have swelling in your ankles.  You have trouble with your vision. SEEK IMMEDIATE MEDICAL CARE IF:  You develop a severe headache or confusion.    You have unusual weakness, numbness, or feel faint.  You have severe chest or abdominal pain.  You vomit repeatedly.  You have trouble breathing. MAKE SURE YOU:   Understand these instructions.  Will watch your condition.  Will get help right away if you are not doing well or get worse. Document  Released: 11/07/2005 Document Revised: 03/24/2014 Document Reviewed: 08/30/2013 ExitCare Patient Information 2015 [employer]. This information is not intended to replace advice given to you by your health care provider. Make sure you discuss any questions you have with your health care provider. Diabetes and Exercise Exercising regularly is important. It is not just about losing weight. It has many health benefits, such as:  Improving your overall fitness, flexibility, and endurance.  Increasing your bone density.  Helping with weight control.  Decreasing your body fat.  Increasing your muscle strength.  Reducing stress and tension.  Improving your overall health. People with diabetes who exercise gain additional benefits because exercise:  Reduces appetite.  Improves the body's use of blood sugar (glucose).  Helps lower or control blood glucose.  Decreases blood pressure.  Helps control blood lipids (such as cholesterol and triglycerides).  Improves the body's use of the hormone insulin by:  Increasing the body's insulin sensitivity.  Reducing the body's insulin needs.  Decreases the risk for heart disease because exercising:  Lowers cholesterol and triglycerides levels.  Increases the levels of good cholesterol (such as high-density lipoproteins [HDL]) in the body.  Lowers blood glucose levels. YOUR ACTIVITY PLAN  Choose an activity that you enjoy and set realistic goals. Your health care provider or diabetes educator can help you make an activity plan that works for you. Exercise regularly as directed by your health care provider. This includes:  Performing resistance training twice a week such as push-ups, sit-ups, lifting weights, or using resistance bands.  Performing 150 minutes of cardio exercises each week such as walking, running, or playing sports.  Staying active and spending no more than 90 minutes at one time being inactive. Even short bursts of  exercise are good for you. Three 10-minute sessions spread throughout the day are just as beneficial as a single 30-minute session. Some exercise ideas include:  Taking the dog for a walk.  Taking the stairs instead of the elevator.  Dancing to your favorite song.  Doing an exercise video.  Doing your favorite exercise with a friend. RECOMMENDATIONS FOR EXERCISING WITH TYPE 1 OR TYPE 2 DIABETES   Check your blood glucose before exercising. If blood glucose levels are greater than 240 mg/dL, check for urine ketones. Do not exercise if ketones are present.  Avoid injecting insulin into areas of the body that are going to be exercised. For example, avoid injecting insulin into:  The arms when playing tennis.  The legs when jogging.  Keep a record of:  Food intake before and after you exercise.  Expected peak times of insulin action.  Blood glucose levels before and after you exercise.  The type and amount of exercise you have done.  Review your records with your health care provider. Your health care provider will help you to develop guidelines for adjusting food intake and insulin amounts before and after exercising.  If you take insulin or oral hypoglycemic agents, watch for signs and symptoms of hypoglycemia. They include:  Dizziness.  Shaking.  Sweating.  Chills.  Confusion.  Drink plenty of water while you exercise to prevent dehydration or heat stroke. Body water is lost during exercise and   must be replaced.  Talk to your health care provider before starting an exercise program to make sure it is safe for you. Remember, almost any type of activity is better than none. Document Released: 01/28/2004 Document Revised: 03/24/2014 Document Reviewed: 04/16/2013 ExitCare Patient Information 2015 [employer]. This information is not intended to replace advice given to you by your health care provider. Make sure you discuss any questions you have with your health care  provider. Basic Carbohydrate Counting for Diabetes Mellitus Carbohydrate counting is a method for keeping track of the amount of carbohydrates you eat. Eating carbohydrates naturally increases the level of sugar (glucose) in your blood, so it is important for you to know the amount that is okay for you to have in every meal. Carbohydrate counting helps keep the level of glucose in your blood within normal limits. The amount of carbohydrates allowed is different for every person. A dietitian can help you calculate the amount that is right for you. Once you know the amount of carbohydrates you can have, you can count the carbohydrates in the foods you want to eat. Carbohydrates are found in the following foods:  Grains, such as breads and cereals.  Dried beans and soy products.  Starchy vegetables, such as potatoes, peas, and corn.  Fruit and fruit juices.  Milk and yogurt.  Sweets and snack foods, such as cake, cookies, candy, chips, soft drinks, and fruit drinks. CARBOHYDRATE COUNTING There are two ways to count the carbohydrates in your food. You can use either of the methods or a combination of both. Reading the "Nutrition Facts" on Packaged Food The "Nutrition Facts" is an area that is included on the labels of almost all packaged food and beverages in the [place]. It includes the serving size of that food or beverage and information about the nutrients in each serving of the food, including the grams (g) of carbohydrate per serving.  Decide the number of servings of this food or beverage that you will be able to eat or drink. Multiply that number of servings by the number of grams of carbohydrate that is listed on the label for that serving. The total will be the amount of carbohydrates you will be having when you eat or drink this food or beverage. Learning Standard Serving Sizes of Food When you eat food that is not packaged or does not include "Nutrition Facts" on the label, you  need to measure the servings in order to count the amount of carbohydrates.A serving of most carbohydrate-rich foods contains about 15 g of carbohydrates. The following list includes serving sizes of carbohydrate-rich foods that provide 15 g ofcarbohydrate per serving:   1 slice of bread (1 oz) or 1 six-inch tortilla.    of a hamburger bun or English muffin.  4-6 crackers.   cup unsweetened dry cereal.    cup hot cereal.   cup rice or pasta.    cup mashed potatoes or  of a large baked potato.  1 cup fresh fruit or one small piece of fruit.    cup canned or frozen fruit or fruit juice.  1 cup milk.   cup plain fat-free yogurt or yogurt sweetened with artificial sweeteners.   cup cooked dried beans or starchy vegetable, such as peas, corn, or potatoes.  Decide the number of standard-size servings that you will eat. Multiply that number of servings by 15 (the grams of carbohydrates in that serving). For example, if you eat 2 cups of strawberries, you will   have eaten 2 servings and 30 g of carbohydrates (2 servings x 15 g = 30 g). For foods such as soups and casseroles, in which more than one food is mixed in, you will need to count the carbohydrates in each food that is included. EXAMPLE OF CARBOHYDRATE COUNTING Sample Dinner  3 oz chicken breast.   cup of brown rice.   cup of corn.  1 cup milk.   1 cup strawberries with sugar-free whipped topping.  Carbohydrate Calculation Step 1: Identify the foods that contain carbohydrates:   Rice.   Corn.   Milk.   Strawberries. Step 2:Calculate the number of servings eaten of each:   2 servings of rice.   1 serving of corn.   1 serving of milk.   1 serving of strawberries. Step 3: Multiply each of those number of servings by 15 g:   2 servings of rice x 15 g = 30 g.   1 serving of corn x 15 g = 15 g.   1 serving of milk x 15 g = 15 g.   1 serving of strawberries x 15 g = 15 g. Step 4:  Add together all of the amounts to find the total grams of carbohydrates eaten: 30 g + 15 g + 15 g + 15 g = 75 g. Document Released: 11/07/2005 Document Revised: 03/24/2014 Document Reviewed: 10/04/2013 ExitCare Patient Information 2015 [employer]. This information is not intended to replace advice given to you by your health care provider. Make sure you discuss any questions you have with your health care provider.  

## 2015-06-30 ENCOUNTER — Telehealth: Payer: Self-pay | Admitting: Clinical

## 2015-06-30 NOTE — Telephone Encounter (Signed)
Left HIPPA-compliant message; attempt at f/u w pt 

## 2015-06-30 NOTE — Telephone Encounter (Signed)
Pt returned call; pt will f/u with [facility] at next PCP visit, or earlier, as needed

## 2015-07-16 ENCOUNTER — Ambulatory Visit: Payer: No Typology Code available for payment source | Admitting: Podiatry

## 2015-07-16 ENCOUNTER — Encounter: Payer: Self-pay | Admitting: Podiatry

## 2015-07-16 VITALS — BP 133/76 | HR 66 | Temp 99.0°F | Resp 14

## 2015-07-16 DIAGNOSIS — M205X9 Other deformities of toe(s) (acquired), unspecified foot: Secondary | ICD-10-CM

## 2015-07-16 DIAGNOSIS — M2020 Hallux rigidus, unspecified foot: Principal | ICD-10-CM

## 2015-07-16 MED ORDER — MELOXICAM 15 MG PO TABS: 15.0000 mg | ORAL_TABLET | Freq: Every day | ORAL | Status: DC

## 2015-07-16 NOTE — Addendum Note (Signed)
Addended by: Harlon Flor, Case Vassell L on: 07/16/2015 11:13 AM   Modules accepted: Orders

## 2015-07-16 NOTE — Progress Notes (Signed)
   Subjective:    Patient ID: Jane Arellano, female    DOB: November 13, 1961, 54 y.o.   MRN: [id number]  HPI  Patient is here today for B/L ball of feet pain and sometimes tingling, since about 2 mos.ago. She was prescribed  Gabapentin-300mg  by pcp which did help some.  Patient presents to my office with numbness entering into her big toe joints both feet.  She was prescribed gabapentin which has mildly helped.  She says the problem has been present for 2 months.  She was referred to office for evaluation of her numbness.   Review of Systems  Skin: Positive for color change.       Objective:   Physical Exam GENERAL APPEARANCE: Alert, conversant. Appropriately groomed. No acute distress.  VASCULAR: Pedal pulses palpable at  [facility] and PT bilateral.  Capillary refill time is immediate to all digits,  Normal temperature gradient.  Digital hair growth is present bilateral  NEUROLOGIC: sensation is normal to 5.07 monofilament at 5/5 sites bilateral.  Light touch is intact bilateral, Muscle strength normal.  MUSCULOSKELETAL: acceptable muscle strength, tone and stability bilateral.  Intrinsic muscluature intact bilateral.  Rectus appearance of foot and digits noted bilateral. 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  Palpable exostosis dorsum 1st metatarsal both feet.  No crepitus or limitation of motion noted.  DERMATOLOGIC: skin color, texture, and turgor are within normal limits.  No preulcerative lesions or ulcers  are seen, no interdigital maceration noted.  No open lesions present.  Digital nails are asymptomatic. No drainage noted.         Assessment & Plan:  Diagnosis  Hallux Limitus 1st MPJ  B/L  IE  Xray.  Discussed numbness with patient and told her it is from hallux limitus and no diabetes.  Prescribed Mobic.  Recommended 3/4 spenco orthoses.

## 2015-07-20 ENCOUNTER — Telehealth: Payer: Self-pay | Admitting: Internal Medicine

## 2015-07-20 NOTE — Telephone Encounter (Signed)
Walk in pt form-[employer] Pt Assistance Form-dropped off Jane Arellano back Tuesday will give to her then

## 2015-07-22 ENCOUNTER — Emergency Department (HOSPITAL_COMMUNITY)
Admission: EM | Admit: 2015-07-22 | Discharge: 2015-07-22 | Disposition: A | Discharge status: Home / Self Care | Payer: Medicaid Other | Attending: Emergency Medicine | Admitting: Emergency Medicine

## 2015-07-22 ENCOUNTER — Encounter (HOSPITAL_COMMUNITY): Payer: Self-pay | Admitting: *Deleted

## 2015-07-22 DIAGNOSIS — I5030 Unspecified diastolic (congestive) heart failure: Secondary | ICD-10-CM | POA: Diagnosis not present

## 2015-07-22 DIAGNOSIS — I10 Essential (primary) hypertension: Secondary | ICD-10-CM | POA: Insufficient documentation

## 2015-07-22 DIAGNOSIS — R011 Cardiac murmur, unspecified: Secondary | ICD-10-CM | POA: Diagnosis not present

## 2015-07-22 DIAGNOSIS — E119 Type 2 diabetes mellitus without complications: Secondary | ICD-10-CM | POA: Diagnosis not present

## 2015-07-22 DIAGNOSIS — Z7901 Long term (current) use of anticoagulants: Secondary | ICD-10-CM | POA: Diagnosis not present

## 2015-07-22 DIAGNOSIS — Z8719 Personal history of other diseases of the digestive system: Secondary | ICD-10-CM | POA: Insufficient documentation

## 2015-07-22 DIAGNOSIS — G43909 Migraine, unspecified, not intractable, without status migrainosus: Secondary | ICD-10-CM | POA: Diagnosis not present

## 2015-07-22 DIAGNOSIS — Z79899 Other long term (current) drug therapy: Secondary | ICD-10-CM | POA: Insufficient documentation

## 2015-07-22 DIAGNOSIS — I480 Paroxysmal atrial fibrillation: Secondary | ICD-10-CM | POA: Insufficient documentation

## 2015-07-22 DIAGNOSIS — Q244 Congenital subaortic stenosis: Secondary | ICD-10-CM | POA: Diagnosis not present

## 2015-07-22 DIAGNOSIS — D509 Iron deficiency anemia, unspecified: Secondary | ICD-10-CM | POA: Insufficient documentation

## 2015-07-22 DIAGNOSIS — Z791 Long term (current) use of non-steroidal anti-inflammatories (NSAID): Secondary | ICD-10-CM | POA: Diagnosis not present

## 2015-07-22 DIAGNOSIS — R51 Headache: Secondary | ICD-10-CM | POA: Diagnosis present

## 2015-07-22 MED ORDER — METOCLOPRAMIDE HCL 5 MG/ML IJ SOLN
10.0000 mg | Freq: Once | INTRAMUSCULAR | Status: AC
  Administered 2015-07-22: 10 mg via INTRAVENOUS
  Filled 2015-07-22: qty 2

## 2015-07-22 MED ORDER — DIPHENHYDRAMINE HCL 50 MG/ML IJ SOLN
25.0000 mg | Freq: Once | INTRAMUSCULAR | Status: AC
  Administered 2015-07-22: 25 mg via INTRAVENOUS
  Filled 2015-07-22: qty 1

## 2015-07-22 MED ORDER — DICLOFENAC SODIUM 75 MG PO TBEC: 75.0000 mg | DELAYED_RELEASE_TABLET | Freq: Two times a day (BID) | ORAL | Status: DC

## 2015-07-22 MED ORDER — DICLOFENAC 35 MG PO CAPS: 1.0000 | ORAL_CAPSULE | Freq: Three times a day (TID) | ORAL | Status: DC | PRN

## 2015-07-22 MED ORDER — SODIUM CHLORIDE 0.9 % IV BOLUS (SEPSIS)
1000.0000 mL | Freq: Once | INTRAVENOUS | Status: AC
  Administered 2015-07-22: 1000 mL via INTRAVENOUS

## 2015-07-22 NOTE — Discharge Instructions (Signed)
Take diclofenac as needed for headache. Drink plenty of fluids. Refer to attached documents for more information.

## 2015-07-22 NOTE — ED Notes (Signed)
Pt states that she has hx of migraines and has been taking tylenol 3 in the past. Pt states that she is out of those. Pt also reports hitting her head several weeks ago and has had intermittent headaches since then.

## 2015-07-22 NOTE — ED Provider Notes (Signed)
CSN: [id number]     Arrival date & time 07/22/15  1150 History   First MD Initiated Contact with Patient 07/22/15 1307     Chief Complaint  Patient presents with  . 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 Patient reports a gradual onset and progressive worsening of the headache. The pain is sharp, constant and is located in generalized head without radiation. Patient has tried tylenol #3 in the past for symptoms but ran out and didn't have anything else to take. No alleviating/aggravating factors. Patient denies other associated symptoms. Patient denies fever, nausea, vomiting, diarrhea, numbness/tingling, weakness, visual changes, congestion, chest pain, SOB, abdominal pain.     Patient is a 54 y.o. female presenting with headaches.  Headache Associated symptoms: no abdominal pain, no diarrhea, no dizziness, no fatigue, no fever, no nausea, no neck pain, no vomiting and no weakness     Past Medical History  Diagnosis Date  . Atypical chest pain     a. 11/2005 Negative Myoview  . Subaortic membrane     a. 01/2010 Echo: EF 55-60%, No rwma, subaortic membrane with elevated LVOT mean gradient of 21 mmHg, Triv AI, mod dil LA, mildly increased PASP. b. 05/02/2013 Echo:  LVEF 91-50%, grade 1 diastolic dysfunction, mild LVH, subaortic stenosis w/ turbulation in LVOT c/w subaortic membrane (mean gradient 42 mmHg/peak gradient 81 mmHg), mild biatrial enlargement  . GERD (gastroesophageal reflux disease)   . HTN (hypertension)   . Iron deficiency anemia   . History of substance abuse   . Paroxysmal atrial fibrillation 05/01/2013    On Xarelto  .  Diastolic dysfunction   . Heart murmur   . DM2 (diabetes mellitus, type 2)   . Dysrhythmia     AFIB HX CV    Past Surgical History  Procedure Laterality Date  . Cesarean section    . Cardioversion N/A 11/04/2013    Procedure: CARDIOVERSION;  Surgeon: Peter M Martinique, MD;  Location: [facility] ENDOSCOPY;  Service: Cardiovascular;  Laterality: N/A;   Family History  Problem Relation Age of Onset  . Hypertension Sister     alive and well  . Diabetes Father     deceased  . Cancer Mother 54    colon  . Stroke Mother   . Hypertension Sister     alive and well  . Heart attack Brother     deceased @ 62   Social History  Substance Use Topics  . Smoking status: Never Smoker   . Smokeless tobacco: Current User    Types: Snuff  . Alcohol Use: No     Comment: Drinks 3 - 40 oz beers daily.  i QUIT DOING THAT "   OB History    No data available     Review of Systems  Constitutional: Negative for fever, chills and fatigue.  HENT: Negative for trouble swallowing.   Eyes: Negative for visual disturbance.  Respiratory: Negative for shortness of breath.   Cardiovascular: Negative for chest pain and palpitations.  Gastrointestinal: Negative for nausea, vomiting, abdominal pain and diarrhea.  Genitourinary: Negative for dysuria and difficulty urinating.  Musculoskeletal: Negative for arthralgias and neck pain.  Skin: Negative for color change.  Neurological: Positive for headaches. Negative for dizziness and  weakness.  Psychiatric/Behavioral: Negative for dysphoric mood.      Allergies  Ketorolac tromethamine  Home Medications   Prior to Admission medications   Medication Sig Start Date End Date Taking? Authorizing Provider  acetaminophen (TYLENOL) 500 MG tablet Take 1,000 mg by mouth every 6 (six) hours as needed for mild pain.    Historical Provider, MD  acetaminophen-codeine (TYLENOL #3) 300-30 MG per tablet Take 1 tablet by mouth every 4 (four) hours as needed. 06/29/15   Tresa Garter, MD  amLODipine (NORVASC) 10 MG tablet Take 1 tablet (10 mg total) by mouth daily. 03/16/15   Tresa Garter, MD  Calcium Carb-Cholecalciferol (CALCIUM-VITAMIN D3) 500-400 MG-UNIT TABS Take 1 tablet by mouth daily.    Historical Provider, MD  carbamide peroxide (DEBROX) 6.5 % otic solution Place 5 drops into both ears 2 (two) times daily. 12/10/14   Josalyn Funches, MD  carvedilol (COREG) 3.125 MG tablet Take 1 tablet (3.125 mg total) by mouth 2 (two) times daily. 07/24/14   Deboraha Sprang, MD  dabigatran (PRADAXA) 150 MG CAPS capsule Take 1 capsule (150 mg total) by mouth 2 (two) times daily. 07/25/14   Deboraha Sprang, MD  famotidine (PEPCID) 20 MG tablet Take 1 tablet (20 mg total) by mouth 2 (two) times daily. 08/14/14   Tresa Garter, MD  ferrous sulfate 325 (65 FE) MG tablet Take 1 tablet (325 mg total) by mouth 3 (three) times daily with meals. 07/31/13   Liliane Shi, PA-C  fish oil-omega-3 fatty acids 1000 MG capsule Take 1 g by mouth daily.    Historical Provider, MD  furosemide (LASIX) 20 MG tablet Take 1 tablet (20 mg total) by mouth daily. 08/14/14   Tresa Garter, MD  gabapentin (NEURONTIN) 300 MG capsule Take 1 capsule (300 mg total) by mouth 3 (three) times daily. 03/16/15   Tresa Garter, MD  glucose monitoring kit (FREESTYLE) monitoring kit 1 each by Does not apply route as needed for other. 05/09/13   Barton Dubois, MD  hydrOXYzine (ATARAX/VISTARIL) 10 MG tablet Take 1 tablet (10 mg total) by mouth 3 (three) times daily as needed. 06/29/15   Tresa Garter, MD  Lancets (FREESTYLE) lancets Use as instructed 10/03/13   Tresa Garter, MD  meloxicam (MOBIC) 15 MG tablet Take 1 tablet (15 mg total) by mouth daily. 07/16/15   Gardiner Barefoot, DPM  metFORMIN (GLUCOPHAGE) 500 MG tablet Take 1 tablet (500 mg total) by mouth 2 (two) times daily with a meal. 08/14/14   Tresa Garter, MD  Multiple Vitamin (MULTIVITAMIN WITH MINERALS) TABS tablet Take 1 tablet  by mouth daily.    Historical Provider, MD  polyethylene glycol powder (GLYCOLAX/MIRALAX) powder Take 17 g by mouth daily. 03/31/14   Lance Bosch, NP  terbinafine (LAMISIL) 250 MG tablet Take 1 tablet (250 mg total) by mouth daily. 03/16/15   Tresa Garter, MD  TRUETEST TEST test strip USE TO TEST BLOOD SUGARS AS DIRECTED BY PHYSICIAN 02/20/15   Tresa Garter, MD   BP 146/75 mmHg  Pulse 64  Temp(Src) 97.6 F (36.4 C) (Oral)  Resp 16  Ht 5' 7" (1.702 m)  Wt 195 lb 1.6 oz (88.497 kg)  BMI 30.55 kg/m2  SpO2 95% Physical Exam  Constitutional: She is oriented to person, place, and time. She appears well-developed and well-nourished. No distress.  HENT:  Head: Normocephalic and atraumatic.  Eyes: Conjunctivae and EOM are normal.  Neck: Normal range  of motion.  Cardiovascular: Normal rate and regular rhythm.  Exam reveals no gallop and no friction rub.   No murmur heard. Pulmonary/Chest: Effort normal and breath sounds normal. She has no wheezes. She has no rales. She exhibits no tenderness.  Abdominal: Soft. She exhibits no distension. There is no tenderness. There is no rebound.  Musculoskeletal: Normal range of motion.  Neurological: She is alert and oriented to person, place, and time. Coordination normal.  Speech is goal-oriented. Moves limbs without ataxia.   Skin: Skin is warm and dry.  Psychiatric: She has a normal mood and affect. Her behavior is normal.  Nursing note and vitals reviewed.   ED Course  Procedures (including critical care time) Labs Review Labs Reviewed - No data to display  Imaging Review No results found. I have personally reviewed and evaluated these images and lab results as part of my medical decision-making.   EKG Interpretation None      MDM   Final diagnoses:  Migraine without status migrainosus, not intractable, unspecified migraine type    3:26 PM Patient reports improvement of her headache with fluids, benadryl, and reglan.  No neuro deficits or recent head trauma. Vitals stable and patient afebrile. Patient will be discharged with diclofenac.    Alvina Chou, PA-C 07/22/15 1527  Elnora Morrison, MD 07/25/15 [id number]

## 2015-07-23 ENCOUNTER — Other Ambulatory Visit: Payer: Self-pay

## 2015-07-23 ENCOUNTER — Telehealth: Payer: Self-pay

## 2015-07-23 MED ORDER — DABIGATRAN ETEXILATE MESYLATE 150 MG PO CAPS: 150.0000 mg | ORAL_CAPSULE | Freq: Two times a day (BID) | ORAL | Status: DC

## 2015-07-23 NOTE — Telephone Encounter (Signed)
Patient Assistance Forms and Rx for Pradaxa  faxed to [facility] PA program.

## 2015-08-04 ENCOUNTER — Other Ambulatory Visit: Payer: Self-pay | Admitting: Internal Medicine

## 2015-08-28 ENCOUNTER — Other Ambulatory Visit: Payer: Self-pay | Admitting: Internal Medicine

## 2015-09-01 ENCOUNTER — Other Ambulatory Visit: Payer: Self-pay | Admitting: Internal Medicine

## 2015-09-02 ENCOUNTER — Telehealth: Payer: Self-pay | Admitting: Internal Medicine

## 2015-09-02 NOTE — Telephone Encounter (Signed)
I left a message for Jane Arellano with assistance program and gave okay to process the pt's prescription for pradaxa.

## 2015-09-02 NOTE — Telephone Encounter (Signed)
New problem   Need verbal ok to process Pradaxa because of how they received it.

## 2015-09-03 NOTE — Telephone Encounter (Signed)
Pt. Called requesting a med refill on famotidine (PEPCID) 20 MG tablet, furosemide (LASIX) 20 MG tablet, and metFORMIN (GLUCOPHAGE) 500 MG tablet. Please f/u with pt.

## 2015-09-04 ENCOUNTER — Inpatient Hospital Stay (HOSPITAL_COMMUNITY): Admission: RE | Admit: 2015-09-04 | Payer: Self-pay | Source: Ambulatory Visit | Admitting: Nurse Practitioner

## 2015-09-04 ENCOUNTER — Other Ambulatory Visit: Payer: Self-pay | Admitting: *Deleted

## 2015-09-04 DIAGNOSIS — E1149 Type 2 diabetes mellitus with other diabetic neurological complication: Secondary | ICD-10-CM

## 2015-09-04 DIAGNOSIS — K219 Gastro-esophageal reflux disease without esophagitis: Secondary | ICD-10-CM

## 2015-09-04 DIAGNOSIS — I10 Essential (primary) hypertension: Secondary | ICD-10-CM

## 2015-09-04 MED ORDER — FUROSEMIDE 20 MG PO TABS: 20.0000 mg | ORAL_TABLET | Freq: Every day | ORAL | Status: DC

## 2015-09-04 MED ORDER — METFORMIN HCL 500 MG PO TABS: 500.0000 mg | ORAL_TABLET | Freq: Two times a day (BID) | ORAL | Status: DC

## 2015-09-04 MED ORDER — FAMOTIDINE 20 MG PO TABS: 20.0000 mg | ORAL_TABLET | Freq: Two times a day (BID) | ORAL | Status: DC

## 2015-09-04 NOTE — Telephone Encounter (Signed)
Patient prescriptions have been refilled.

## 2015-09-17 ENCOUNTER — Inpatient Hospital Stay (HOSPITAL_COMMUNITY): Admission: RE | Admit: 2015-09-17 | Payer: Self-pay | Source: Ambulatory Visit | Admitting: Nurse Practitioner

## 2015-09-28 ENCOUNTER — Other Ambulatory Visit: Payer: Self-pay | Admitting: Pharmacist

## 2015-09-28 MED ORDER — GLUCOSE BLOOD VI STRP: ORAL_STRIP | Status: DC

## 2015-09-28 MED ORDER — TRUE METRIX AIR GLUCOSE METER W/DEVICE KIT: 1.0000 | PACK | Freq: Two times a day (BID) | Status: DC

## 2015-09-29 ENCOUNTER — Inpatient Hospital Stay (HOSPITAL_COMMUNITY): Admission: RE | Admit: 2015-09-29 | Payer: Self-pay | Source: Ambulatory Visit | Admitting: Nurse Practitioner

## 2015-10-02 ENCOUNTER — Ambulatory Visit: Payer: Self-pay | Attending: Internal Medicine

## 2015-10-06 ENCOUNTER — Telehealth: Payer: Self-pay

## 2015-10-06 NOTE — Telephone Encounter (Signed)
Letter from [employer]. Approval for Pradaxa 150mg  for one year.

## 2015-10-07 ENCOUNTER — Ambulatory Visit: Payer: Self-pay | Admitting: Pharmacist

## 2015-10-07 ENCOUNTER — Ambulatory Visit: Payer: Medicaid Other | Attending: Internal Medicine

## 2015-10-07 DIAGNOSIS — Z23 Encounter for immunization: Secondary | ICD-10-CM | POA: Insufficient documentation

## 2015-10-07 DIAGNOSIS — Z0000 Encounter for general adult medical examination without abnormal findings: Secondary | ICD-10-CM

## 2015-10-08 ENCOUNTER — Ambulatory Visit (HOSPITAL_COMMUNITY)
Admission: RE | Admit: 2015-10-08 | Discharge: 2015-10-08 | Disposition: A | Discharge status: Home / Self Care | Payer: Medicaid Other | Source: Ambulatory Visit | Attending: Nurse Practitioner | Admitting: Nurse Practitioner

## 2015-10-08 VITALS — BP 140/84 | HR 68 | Ht 67.0 in | Wt 197.8 lb

## 2015-10-08 DIAGNOSIS — I480 Paroxysmal atrial fibrillation: Secondary | ICD-10-CM | POA: Diagnosis present

## 2015-10-08 DIAGNOSIS — I10 Essential (primary) hypertension: Secondary | ICD-10-CM | POA: Diagnosis not present

## 2015-10-08 NOTE — Progress Notes (Signed)
Patient ID: Jane Arellano, female   DOB: [birth year], 54 y.o.   MRN: [id number]     Primary Care Physician: Angelica Chessman, MD Referring Physician: Dr. Gwendel Hanson is a 54 y.o. female with a h/o PAF in the afib clinic for evaluation. Dx'ed 6/14, asymtpomatic, picked up on physical exam. Apparently very little afib burden since then.  She reports that she feels well other than dealing with peripheral neuropathy from her DM. She has not noticed any reoccurring afib. Continues on DOAC, carvedilol.   Lifestyle habits reviewed and pt reports very little caffeine, no alcohol, walks daily. Denies snoring. Discussed avoiding decongestants going into cold/flu season.  Today, she denies symptoms of palpitations, chest pain, shortness of breath, orthopnea, PND, lower extremity edema, dizziness, presyncope, syncope, or neurologic sequela. The patient is tolerating medications without difficulties and is otherwise without complaint today.   Past Medical History  Diagnosis Date  . Atypical chest pain     a. 11/2005 Negative Myoview  . Subaortic membrane     a. 01/2010 Echo: EF 55-60%, No rwma, subaortic membrane with elevated LVOT mean gradient of 21 mmHg, Triv AI, mod dil LA, mildly increased PASP. b. 05/02/2013 Echo:  LVEF 26-20%, grade 1 diastolic dysfunction, mild LVH, subaortic stenosis w/ turbulation in LVOT c/w subaortic membrane (mean gradient 42 mmHg/peak gradient 81 mmHg), mild biatrial enlargement  . GERD (gastroesophageal reflux disease)   . HTN (hypertension)   . Iron deficiency anemia   . History of substance abuse   . Paroxysmal atrial fibrillation 05/01/2013    On Xarelto  . Diastolic dysfunction   . Heart murmur   . DM2 (diabetes mellitus, type 2)   . Dysrhythmia     AFIB HX CV    Past Surgical History  Procedure Laterality Date  . Cesarean section    . Cardioversion N/A 11/04/2013    Procedure: CARDIOVERSION;  Surgeon: Peter M Martinique, MD;  Location: [facility] ENDOSCOPY;   Service: Cardiovascular;  Laterality: N/A;    Current Outpatient Prescriptions  Medication Sig Dispense Refill  . acetaminophen (TYLENOL) 500 MG tablet Take 1,000 mg by mouth every 6 (six) hours as needed for mild pain.    Marland Kitchen amLODipine (NORVASC) 10 MG tablet Take 1 tablet (10 mg total) by mouth daily. 90 tablet 3  . antiseptic oral rinse (BIOTENE) LIQD 15 mLs by Mouth Rinse route daily.    . Blood Glucose Monitoring Suppl (TRUE METRIX AIR GLUCOSE METER) W/DEVICE KIT 1 kit by Does not apply route 2 (two) times daily at 8 am and 10 pm. 1 kit 0  . Calcium Carb-Cholecalciferol (CALCIUM-VITAMIN D3) 500-400 MG-UNIT TABS Take 1 tablet by mouth daily.    . carbamide peroxide (DEBROX) 6.5 % otic solution Place 5 drops into both ears 2 (two) times daily. (Patient taking differently: Place 5 drops into both ears 2 (two) times daily as needed (for ear wax). ) 15 mL 0  . carvedilol (COREG) 3.125 MG tablet TAKE 1 TABLET BY MOUTH 2 TIMES DAILY. 180 tablet 0  . dabigatran (PRADAXA) 150 MG CAPS capsule Take 1 capsule (150 mg total) by mouth 2 (two) times daily. 180 capsule 3  . famotidine (PEPCID) 20 MG tablet Take 1 tablet (20 mg total) by mouth 2 (two) times daily. 180 tablet 3  . ferrous sulfate 325 (65 FE) MG tablet Take 1 tablet (325 mg total) by mouth 3 (three) times daily with meals.    . fish oil-omega-3 fatty acids 1000 MG  capsule Take 1 g by mouth daily.    . furosemide (LASIX) 20 MG tablet Take 1 tablet (20 mg total) by mouth daily. 90 tablet 3  . glucose blood (TRUE METRIX BLOOD GLUCOSE TEST) test strip Use as instructed 100 each 12  . Lancets (FREESTYLE) lancets Use as instructed 100 each 12  . metFORMIN (GLUCOPHAGE) 500 MG tablet Take 1 tablet (500 mg total) by mouth 2 (two) times daily with a meal. 180 tablet 3  . Multiple Vitamin (MULTIVITAMIN WITH MINERALS) TABS tablet Take 1 tablet by mouth daily.    . polyethylene glycol powder (GLYCOLAX/MIRALAX) powder Take 17 g by mouth daily. (Patient  taking differently: Take 17 g by mouth daily as needed for moderate constipation. ) 850 g 1  . terbinafine (LAMISIL) 250 MG tablet Take 1 tablet (250 mg total) by mouth daily. 90 tablet 3  . gabapentin (NEURONTIN) 300 MG capsule Take 1 capsule (300 mg total) by mouth 3 (three) times daily. 90 capsule 3  . [DISCONTINUED] potassium chloride (K-DUR,KLOR-CON) 10 MEQ tablet Take 1 tablet (10 mEq total) by mouth daily. 30 tablet 0   No current facility-administered medications for this encounter.    Allergies  Allergen Reactions  . Xarelto [Rivaroxaban] Swelling    Gums bled, headaches  . Ketorolac Tromethamine Other (See Comments)    Unknown reaction    Social History   Social History  . Marital Status: Single    Spouse Name: N/A  . Number of Children: N/A  . Years of Education: N/A   Occupational History  . Not on file.   Social History Main Topics  . Smoking status: Never Smoker   . Smokeless tobacco: Current User    Types: Snuff  . Alcohol Use: No     Comment: Drinks 3 - 40 oz beers daily.  i QUIT DOING THAT "  . Drug Use: No     Comment: Smokes marijuana weekly.  Has not used cocaine in 9 years.  . Sexual Activity: Yes    Birth Control/ Protection: None   Other Topics Concern  . Not on file   Social History Narrative   Lives in [place] by herself.  She had been caring for her mother but she died 2 mos ago.  She tries to remain active but does not regularly exercise.    Family History  Problem Relation Age of Onset  . Hypertension Sister     alive and well  . Diabetes Father     deceased  . Cancer Mother 19    colon  . Stroke Mother   . Hypertension Sister     alive and well  . Heart attack Brother     deceased @ 65    ROS- All systems are reviewed and negative except as per the HPI above  Physical Exam: Filed Vitals:   10/08/15 1106  BP: 140/84  Pulse: 68  Height: $Remove'5\' 7"'UzdqETr$  (1.702 m)  Weight: 197 lb 12.8 oz (89.721 kg)    GEN- The patient is well  appearing, alert and oriented x 3 today.   Head- normocephalic, atraumatic Eyes-  Sclera clear, conjunctiva pink Ears- hearing intact Oropharynx- clear Neck- supple, no JVP Lymph- no cervical lymphadenopathy Lungs- Clear to ausculation bilaterally, normal work of breathing Heart- Regular rate and rhythm, 2/6 systolic murmiur murmurs, rubs or gallops, PMI not laterally displaced GI- soft, NT, ND, + BS Extremities- no clubbing, cyanosis, or edema MS- no significant deformity or atrophy Skin- no rash or lesion  Psych- euthymic mood, full affect Neuro- strength and sensation are intact  EKG-SR with pac's, 68 bpm, qrs int 88 ms, qtc 450 ms Epic records reviewed  Assessment and Plan: 1. PAF Continues in SR Continue carvedilol Continue pradaxa for chadsvasc of at least 3.  2. Htn Stable  F/u in afib clinic in 3 days

## 2015-10-08 NOTE — Patient Instructions (Signed)
Try to avoid over the counter medications that include "D" behind the name -- can take mucinex, claritin, robutussin as long as without D.

## 2015-12-02 MED FILL — ?CARVEDILOL 3.125 MG TABLET: 3.125 | 30 days supply | Qty: 60 | Fill #3

## 2015-12-02 MED FILL — ?METFORMIN HCL 500MG TABLET: 500 | 30 days supply | Qty: 60 | Fill #2

## 2015-12-02 MED FILL — TERBINAFINE HCL 250 MG TAB: 250 | 30 days supply | Qty: 30 | Fill #8

## 2015-12-02 MED FILL — ?FAMOTIDINE 20 MG TABLET: 20 | 30 days supply | Qty: 60 | Fill #2

## 2015-12-02 MED FILL — ?FUROSEMIDE 20 MG TABLET: 20 | 30 days supply | Qty: 30 | Fill #3

## 2015-12-02 MED FILL — AMLODIPINE BESYLATE 10 MG T: 10 | 30 days supply | Qty: 30 | Fill #8

## 2016-01-04 ENCOUNTER — Other Ambulatory Visit: Payer: Self-pay | Admitting: Pharmacist

## 2016-01-04 MED ORDER — ACCU-CHEK SOFTCLIX LANCETS MISC: Status: DC

## 2016-01-04 MED ORDER — ACCU-CHEK AVIVA PLUS W/DEVICE KIT: PACK | Status: DC

## 2016-01-04 MED ORDER — GLUCOSE BLOOD VI STRP: ORAL_STRIP | Status: DC

## 2016-01-04 MED FILL — metFORMIN HCL 500 MG TABS: 500 | 30 days supply | Qty: 60 | Fill #3

## 2016-01-04 MED FILL — ACCU-CHEK AVIVA PLUS TEST S: 30 days supply | Qty: 100 | Fill #0

## 2016-01-04 MED FILL — AMLODIPINE BESYLATE 10 MG T: 10 | 30 days supply | Qty: 30 | Fill #9

## 2016-01-04 MED FILL — FUROSEMIDE 20 MG TABLET: 20 | 30 days supply | Qty: 30 | Fill #4

## 2016-01-04 MED FILL — TERBINAFINE HCL 250 MG TAB: 250 | 30 days supply | Qty: 30 | Fill #9

## 2016-01-04 MED FILL — ACCU-CHEK AVIVA PLUS METER: W/DEVICE | 30 days supply | Qty: 1 | Fill #0

## 2016-01-04 MED FILL — ACCU-CHEK SOFTCLIX LANCETS: 30 days supply | Qty: 100 | Fill #0

## 2016-01-07 ENCOUNTER — Inpatient Hospital Stay (HOSPITAL_COMMUNITY): Admission: RE | Admit: 2016-01-07 | Payer: Self-pay | Source: Ambulatory Visit | Admitting: Nurse Practitioner

## 2016-01-11 ENCOUNTER — Other Ambulatory Visit: Payer: Self-pay | Admitting: Pharmacist

## 2016-01-11 MED ORDER — ACCU-CHEK AVIVA VI SOLN: Status: AC

## 2016-01-12 MED FILL — ACCU CHECK CONTROL SOLN.: 30 days supply | Qty: 1 | Fill #0

## 2016-01-14 ENCOUNTER — Encounter: Payer: Self-pay | Admitting: Internal Medicine

## 2016-01-14 ENCOUNTER — Ambulatory Visit
Admission: RE | Admit: 2016-01-14 | Discharge: 2016-01-14 | Disposition: A | Discharge status: Home / Self Care | Payer: Medicaid Other | Source: Ambulatory Visit | Attending: Nurse Practitioner | Admitting: Nurse Practitioner

## 2016-01-14 ENCOUNTER — Ambulatory Visit (HOSPITAL_BASED_OUTPATIENT_CLINIC_OR_DEPARTMENT_OTHER): Payer: Medicaid Other | Admitting: Internal Medicine

## 2016-01-14 VITALS — BP 160/102 | HR 84 | Ht 67.0 in | Wt 197.6 lb

## 2016-01-14 VITALS — BP 114/74 | HR 61 | Temp 97.9°F | Resp 18 | Ht 67.0 in | Wt 196.4 lb

## 2016-01-14 DIAGNOSIS — Z1211 Encounter for screening for malignant neoplasm of colon: Secondary | ICD-10-CM | POA: Diagnosis not present

## 2016-01-14 DIAGNOSIS — I10 Essential (primary) hypertension: Secondary | ICD-10-CM | POA: Diagnosis not present

## 2016-01-14 DIAGNOSIS — Z823 Family history of stroke: Secondary | ICD-10-CM | POA: Insufficient documentation

## 2016-01-14 DIAGNOSIS — K219 Gastro-esophageal reflux disease without esophagitis: Secondary | ICD-10-CM | POA: Diagnosis not present

## 2016-01-14 DIAGNOSIS — Z833 Family history of diabetes mellitus: Secondary | ICD-10-CM | POA: Insufficient documentation

## 2016-01-14 DIAGNOSIS — Z7902 Long term (current) use of antithrombotics/antiplatelets: Secondary | ICD-10-CM | POA: Diagnosis not present

## 2016-01-14 DIAGNOSIS — E119 Type 2 diabetes mellitus without complications: Secondary | ICD-10-CM | POA: Insufficient documentation

## 2016-01-14 DIAGNOSIS — Z79899 Other long term (current) drug therapy: Secondary | ICD-10-CM | POA: Diagnosis not present

## 2016-01-14 DIAGNOSIS — E118 Type 2 diabetes mellitus with unspecified complications: Secondary | ICD-10-CM

## 2016-01-14 DIAGNOSIS — E1142 Type 2 diabetes mellitus with diabetic polyneuropathy: Secondary | ICD-10-CM | POA: Insufficient documentation

## 2016-01-14 DIAGNOSIS — Z1212 Encounter for screening for malignant neoplasm of rectum: Secondary | ICD-10-CM

## 2016-01-14 DIAGNOSIS — Z888 Allergy status to other drugs, medicaments and biological substances status: Secondary | ICD-10-CM | POA: Diagnosis not present

## 2016-01-14 DIAGNOSIS — Z0100 Encounter for examination of eyes and vision without abnormal findings: Secondary | ICD-10-CM

## 2016-01-14 DIAGNOSIS — Z7984 Long term (current) use of oral hypoglycemic drugs: Secondary | ICD-10-CM | POA: Insufficient documentation

## 2016-01-14 DIAGNOSIS — I480 Paroxysmal atrial fibrillation: Secondary | ICD-10-CM | POA: Diagnosis present

## 2016-01-14 DIAGNOSIS — Z8249 Family history of ischemic heart disease and other diseases of the circulatory system: Secondary | ICD-10-CM | POA: Insufficient documentation

## 2016-01-14 LAB — GLUCOSE, POCT (MANUAL RESULT ENTRY): POC GLUCOSE: 127 mg/dL — AB (ref 70–99)

## 2016-01-14 LAB — POCT GLYCOSYLATED HEMOGLOBIN (HGB A1C): Hemoglobin A1C: 6.6

## 2016-01-14 MED ORDER — GABAPENTIN 300 MG PO CAPS: 300.0000 mg | ORAL_CAPSULE | Freq: Three times a day (TID) | ORAL | Status: DC

## 2016-01-14 MED ORDER — AMITRIPTYLINE HCL 50 MG PO TABS: 50.0000 mg | ORAL_TABLET | Freq: Every day | ORAL | Status: DC

## 2016-01-14 MED FILL — FAMOTIDINE 20 MG TABLET: 20 | 30 days supply | Qty: 60 | Fill #3

## 2016-01-14 MED FILL — GABAPENTIN 300 MG CAPSULE: 300 | 30 days supply | Qty: 90 | Fill #0

## 2016-01-14 MED FILL — CARVEDILOL 3.125 MG TABLET: 3.125 | 30 days supply | Qty: 60 | Fill #4

## 2016-01-14 MED FILL — AMITRIPTYLINE HCL 50 MG TAB: 50 | 30 days supply | Qty: 30 | Fill #0

## 2016-01-14 NOTE — Progress Notes (Signed)
Patient is here for FU headache and Nerve Pain  Patient complains of nerve pain in her legs. Patient complains of HA being present for 2 days. Possibly due to stressful environment patient resides in.
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
## 2016-01-14 NOTE — Patient Instructions (Signed)
Diabetes and Foot Care Diabetes may cause you to have problems because of poor blood supply (circulation) to your feet and legs. This may cause the skin on your feet to become thinner, break easier, and heal more slowly. Your skin may become dry, and the skin may peel and crack. You may also have nerve damage in your legs and feet causing decreased feeling in them. You may not notice minor injuries to your feet that could lead to infections or more serious problems. Taking care of your feet is one of the most important things you can do for yourself.  HOME CARE INSTRUCTIONS  Wear shoes at all times, even in the house. Do not go barefoot. Bare feet are easily injured.  Check your feet daily for blisters, cuts, and redness. If you cannot see the bottom of your feet, use a mirror or ask someone for help.  Wash your feet with warm water (do not use hot water) and mild soap. Then pat your feet and the areas between your toes until they are completely dry. Do not soak your feet as this can dry your skin.  Apply a moisturizing lotion or petroleum jelly (that does not contain alcohol and is unscented) to the skin on your feet and to dry, brittle toenails. Do not apply lotion between your toes.  Trim your toenails straight across. Do not dig under them or around the cuticle. File the edges of your nails with an emery board or nail file.  Do not cut corns or calluses or try to remove them with medicine.  Wear clean socks or stockings every day. Make sure they are not too tight. Do not wear knee-high stockings since they may decrease blood flow to your legs.  Wear shoes that fit properly and have enough cushioning. To break in new shoes, wear them for just a few hours a day. This prevents you from injuring your feet. Always look in your shoes before you put them on to be sure there are no objects inside.  Do not cross your legs. This may decrease the blood flow to your feet.  If you find a minor scrape,  cut, or break in the skin on your feet, keep it and the skin around it clean and dry. These areas may be cleansed with mild soap and water. Do not cleanse the area with peroxide, alcohol, or iodine.  When you remove an adhesive bandage, be sure not to damage the skin around it.  If you have a wound, look at it several times a day to make sure it is healing.  Do not use heating pads or hot water bottles. They may burn your skin. If you have lost feeling in your feet or legs, you may not know it is happening until it is too late.  Make sure your health care provider performs a complete foot exam at least annually or more often if you have foot problems. Report any cuts, sores, or bruises to your health care provider immediately. SEEK MEDICAL CARE IF:   You have an injury that is not healing.  You have cuts or breaks in the skin.  You have an ingrown nail.  You notice redness on your legs or feet.  You feel burning or tingling in your legs or feet.  You have pain or cramps in your legs and feet.  Your legs or feet are numb.  Your feet always feel cold. SEEK IMMEDIATE MEDICAL CARE IF:   There is increasing redness,   swelling, or pain in or around a wound.  There is a red line that goes up your leg.  Pus is coming from a wound.  You develop a fever or as directed by your health care provider.  You notice a bad smell coming from an ulcer or wound.   This information is not intended to replace advice given to you by your health care provider. Make sure you discuss any questions you have with your health care provider.   Document Released: 11/04/2000 Document Revised: 07/10/2013 Document Reviewed: 04/16/2013 Elsevier Interactive Patient Education 2016 [employer] Diabetes and Exercise Exercising regularly is important. It is not just about losing weight. It has many health benefits, such as:  Improving your overall fitness, flexibility, and endurance.  Increasing your bone  density.  Helping with weight control.  Decreasing your body fat.  Increasing your muscle strength.  Reducing stress and tension.  Improving your overall health. People with diabetes who exercise gain additional benefits because exercise:  Reduces appetite.  Improves the body's use of blood sugar (glucose).  Helps lower or control blood glucose.  Decreases blood pressure.  Helps control blood lipids (such as cholesterol and triglycerides).  Improves the body's use of the hormone insulin by:  Increasing the body's insulin sensitivity.  Reducing the body's insulin needs.  Decreases the risk for heart disease because exercising:  Lowers cholesterol and triglycerides levels.  Increases the levels of good cholesterol (such as high-density lipoproteins [HDL]) in the body.  Lowers blood glucose levels. YOUR ACTIVITY PLAN  Choose an activity that you enjoy, and set realistic goals. To exercise safely, you should begin practicing any new physical activity slowly, and gradually increase the intensity of the exercise over time. Your health care provider or diabetes educator can help create an activity plan that works for you. General recommendations include:  Encouraging children to engage in at least 60 minutes of physical activity each day.  Stretching and performing strength training exercises, such as yoga or weight lifting, at least 2 times per week.  Performing a total of at least 150 minutes of moderate-intensity exercise each week, such as brisk walking or water aerobics.  Exercising at least 3 days per week, making sure you allow no more than 2 consecutive days to pass without exercising.  Avoiding long periods of inactivity (90 minutes or more). When you have to spend an extended period of time sitting down, take frequent breaks to walk or stretch. RECOMMENDATIONS FOR EXERCISING WITH TYPE 1 OR TYPE 2 DIABETES   Check your blood glucose before exercising. If blood  glucose levels are greater than 240 mg/dL, check for urine ketones. Do not exercise if ketones are present.  Avoid injecting insulin into areas of the body that are going to be exercised. For example, avoid injecting insulin into:  The arms when playing tennis.  The legs when jogging.  Keep a record of:  Food intake before and after you exercise.  Expected peak times of insulin action.  Blood glucose levels before and after you exercise.  The type and amount of exercise you have done.  Review your records with your health care provider. Your health care provider will help you to develop guidelines for adjusting food intake and insulin amounts before and after exercising.  If you take insulin or oral hypoglycemic agents, watch for signs and symptoms of hypoglycemia. They include:  Dizziness.  Shaking.  Sweating.  Chills.  Confusion.  Drink plenty of water while you exercise to prevent   dehydration or heat stroke. Body water is lost during exercise and must be replaced.  Talk to your health care provider before starting an exercise program to make sure it is safe for you. Remember, almost any type of activity is better than none.   This information is not intended to replace advice given to you by your health care provider. Make sure you discuss any questions you have with your health care provider.   Document Released: 01/28/2004 Document Revised: 03/24/2015 Document Reviewed: 04/16/2013 Elsevier Interactive Patient Education 2016 [employer] Basic Carbohydrate Counting for Diabetes Mellitus Carbohydrate counting is a method for keeping track of the amount of carbohydrates you eat. Eating carbohydrates naturally increases the level of sugar (glucose) in your blood, so it is important for you to know the amount that is okay for you to have in every meal. Carbohydrate counting helps keep the level of glucose in your blood within normal limits. The amount of carbohydrates  allowed is different for every person. A dietitian can help you calculate the amount that is right for you. Once you know the amount of carbohydrates you can have, you can count the carbohydrates in the foods you want to eat. Carbohydrates are found in the following foods:  Grains, such as breads and cereals.  Dried beans and soy products.  Starchy vegetables, such as potatoes, peas, and corn.  Fruit and fruit juices.  Milk and yogurt.  Sweets and snack foods, such as cake, cookies, candy, chips, soft drinks, and fruit drinks. CARBOHYDRATE COUNTING There are two ways to count the carbohydrates in your food. You can use either of the methods or a combination of both. Reading the "Nutrition Facts" on Packaged Food The "Nutrition Facts" is an area that is included on the labels of almost all packaged food and beverages in the [place]. It includes the serving size of that food or beverage and information about the nutrients in each serving of the food, including the grams (g) of carbohydrate per serving.  Decide the number of servings of this food or beverage that you will be able to eat or drink. Multiply that number of servings by the number of grams of carbohydrate that is listed on the label for that serving. The total will be the amount of carbohydrates you will be having when you eat or drink this food or beverage. Learning Standard Serving Sizes of Food When you eat food that is not packaged or does not include "Nutrition Facts" on the label, you need to measure the servings in order to count the amount of carbohydrates.A serving of most carbohydrate-rich foods contains about 15 g of carbohydrates. The following list includes serving sizes of carbohydrate-rich foods that provide 15 g ofcarbohydrate per serving:   1 slice of bread (1 oz) or 1 six-inch tortilla.    of a hamburger bun or English muffin.  4-6 crackers.   cup unsweetened dry cereal.    cup hot cereal.    cup rice or pasta.    cup mashed potatoes or  of a large baked potato.  1 cup fresh fruit or one small piece of fruit.    cup canned or frozen fruit or fruit juice.  1 cup milk.   cup plain fat-free yogurt or yogurt sweetened with artificial sweeteners.   cup cooked dried beans or starchy vegetable, such as peas, corn, or potatoes.  Decide the number of standard-size servings that you will eat. Multiply that number of servings by 15 (the grams   of carbohydrates in that serving). For example, if you eat 2 cups of strawberries, you will have eaten 2 servings and 30 g of carbohydrates (2 servings x 15 g = 30 g). For foods such as soups and casseroles, in which more than one food is mixed in, you will need to count the carbohydrates in each food that is included. EXAMPLE OF CARBOHYDRATE COUNTING Sample Dinner  3 oz chicken breast.   cup of brown rice.   cup of corn.  1 cup milk.   1 cup strawberries with sugar-free whipped topping.  Carbohydrate Calculation Step 1: Identify the foods that contain carbohydrates:   Rice.   Corn.   Milk.   Strawberries. Step 2:Calculate the number of servings eaten of each:   2 servings of rice.   1 serving of corn.   1 serving of milk.   1 serving of strawberries. Step 3: Multiply each of those number of servings by 15 g:   2 servings of rice x 15 g = 30 g.   1 serving of corn x 15 g = 15 g.   1 serving of milk x 15 g = 15 g.   1 serving of strawberries x 15 g = 15 g. Step 4: Add together all of the amounts to find the total grams of carbohydrates eaten: 30 g + 15 g + 15 g + 15 g = 75 g.   This information is not intended to replace advice given to you by your health care provider. Make sure you discuss any questions you have with your health care provider.   Document Released: 11/07/2005 Document Revised: 11/28/2014 Document Reviewed: 10/04/2013 Elsevier Interactive Patient Education 2016 [employer] Inc[employer]  

## 2016-01-15 ENCOUNTER — Encounter: Payer: Self-pay | Admitting: Internal Medicine

## 2016-01-22 ENCOUNTER — Other Ambulatory Visit: Payer: Self-pay

## 2016-01-22 DIAGNOSIS — Z1231 Encounter for screening mammogram for malignant neoplasm of breast: Secondary | ICD-10-CM

## 2016-02-04 ENCOUNTER — Other Ambulatory Visit: Payer: Self-pay | Admitting: Internal Medicine

## 2016-02-04 MED FILL — metFORMIN HCL 500 MG TABS: 500 | 30 days supply | Qty: 60 | Fill #4

## 2016-02-04 MED FILL — FUROSEMIDE 20 MG TABLET: 20 | 30 days supply | Qty: 30 | Fill #5

## 2016-02-04 MED FILL — AMLODIPINE BESYLATE 10 MG T: 10 | 30 days supply | Qty: 30 | Fill #10

## 2016-02-04 MED FILL — TERBINAFINE HCL 250 MG TAB: 250 | 30 days supply | Qty: 30 | Fill #10

## 2016-02-22 MED FILL — CARVEDILOL 3.125 MG TABLET: 3.125 | 30 days supply | Qty: 60 | Fill #5

## 2016-02-22 MED FILL — FAMOTIDINE 20 MG TABLET: 20 | 30 days supply | Qty: 60 | Fill #4

## 2016-02-22 MED FILL — AMITRIPTYLINE HCL 50 MG TAB: 50 | 30 days supply | Qty: 30 | Fill #1

## 2016-02-23 ENCOUNTER — Encounter: Payer: Self-pay | Admitting: Podiatry

## 2016-02-23 ENCOUNTER — Ambulatory Visit (INDEPENDENT_AMBULATORY_CARE_PROVIDER_SITE_OTHER): Payer: Medicaid Other | Admitting: Podiatry

## 2016-02-23 VITALS — BP 119/78 | HR 73 | Resp 14

## 2016-02-23 DIAGNOSIS — M205X9 Other deformities of toe(s) (acquired), unspecified foot: Secondary | ICD-10-CM

## 2016-02-23 DIAGNOSIS — B351 Tinea unguium: Secondary | ICD-10-CM

## 2016-02-23 DIAGNOSIS — M2020 Hallux rigidus, unspecified foot: Principal | ICD-10-CM

## 2016-02-23 MED ORDER — MELOXICAM 15 MG PO TABS: 15.0000 mg | ORAL_TABLET | Freq: Every day | ORAL | Status: DC

## 2016-02-23 MED FILL — POLYETHYLENE GLYCOL 3350: 30 days supply | Qty: 510 | Fill #0

## 2016-02-23 MED FILL — MELOXICAM 15 MG TABLET: 15 | 30 days supply | Qty: 30 | Fill #0

## 2016-02-23 NOTE — Progress Notes (Signed)
Subjective:     Patient ID: Jane LevyWindy Y Arellano, female   DOB: 02/26/1961, 55 y.o.   MRN: [id number]  HPI this patient returns to the office today to evaluate and discuss her diabetes and treatment. She says that her medical doctor increased her gabapentin by with minimal success. She also says that her nails are looking better as nail growing out but the tips of the nails continued to be thick and dark. She was also diagnosed by myself with a hallux limitus first MPJ both feet. She says that the medication that was dispensed for this problem was beneficial  but she did not have a refill. She presents the office for evaluation and discussion of these conditions   Review of Systems     Objective:   Physical Exam     Physical Exam GENERAL APPEARANCE: Alert, conversant. Appropriately groomed. No acute distress.  VASCULAR: Pedal pulses [facility] bilateral. Capillary refill time is immediate to all digits, Normal temperature gradient. Digital hair growth is present bilateral  NEUROLOGIC: sensation is normal to 5.07 monofilament at 5/5 sites bilateral. Light touch is intact bilateral, Muscle strength normal.  MUSCULOSKELETAL: acceptable muscle strength, tone and stability bilateral. Intrinsic muscluature intact bilateral. Rectus appearance of foot and digits noted bilateral. Functional hallux limitus 1st MPJ B/L. Palpable exostosis dorsum 1st metatarsal both feet. No crepitus or limitation of motion noted.  DERMATOLOGIC: skin color, texture, and turgor are within normal limits. No preulcerative lesions or ulcers are seen, no interdigital maceration noted. No open lesions present. Digital nails are asymptomatic. No drainage noted.The proximal portion of her toenails are growing out normal and the distal aspect of nail is thick and dark.            Assessment:     Hallux Limitus 1st MPJ B/L   Onychomycosis B/l     Plan:     ROV.  After discussing her problems ,  She was  told to continue with Lamisil and refill of Mobic was prescribed.  RTC prn   Helane GuntherGregory Imya Mance DPM

## 2016-02-26 ENCOUNTER — Ambulatory Visit: Payer: Medicaid Other

## 2016-03-07 MED FILL — AMLODIPINE BESYLATE 10 MG T: 10 | 30 days supply | Qty: 30 | Fill #11

## 2016-03-07 MED FILL — TERBINAFINE HCL 250 MG TAB: 250 | 30 days supply | Qty: 30 | Fill #11

## 2016-03-07 MED FILL — FUROSEMIDE 20 MG TABLET: 20 | 30 days supply | Qty: 30 | Fill #6

## 2016-03-15 MED FILL — metFORMIN HCL 500 MG TABS: 500 | 30 days supply | Qty: 60 | Fill #5

## 2016-03-15 MED FILL — GABAPENTIN 300 MG CAPSULE: 300 | 30 days supply | Qty: 90 | Fill #1

## 2016-03-16 ENCOUNTER — Ambulatory Visit
Admission: RE | Admit: 2016-03-16 | Discharge: 2016-03-16 | Disposition: A | Discharge status: Home / Self Care | Payer: Medicaid Other | Source: Ambulatory Visit

## 2016-03-16 DIAGNOSIS — Z1231 Encounter for screening mammogram for malignant neoplasm of breast: Secondary | ICD-10-CM

## 2016-03-22 ENCOUNTER — Telehealth: Payer: Self-pay | Admitting: *Deleted

## 2016-03-22 NOTE — Telephone Encounter (Signed)
Medical Assistant left message on patient's home and cell voicemail. Voicemail states to give a call back to Nubia with [facility] at [phone].  

## 2016-03-22 NOTE — Telephone Encounter (Signed)
-----   Message from Quentin Angstlugbemiga E Jegede, MD sent at 03/21/2016 12:31 PM EDT ----- Please inform patient that her screening mammogram shows no evidence of malignancy. Recommend screening mammogram in one year

## 2016-03-28 MED FILL — MELOXICAM 15 MG TABLET: 15 | 30 days supply | Qty: 30 | Fill #1

## 2016-04-06 ENCOUNTER — Other Ambulatory Visit: Payer: Self-pay | Admitting: Internal Medicine

## 2016-04-06 MED FILL — FAMOTIDINE 20 MG TABLET: 20 | 30 days supply | Qty: 60 | Fill #5

## 2016-04-06 MED FILL — AMITRIPTYLINE HCL 50 MG TAB: 50 | 30 days supply | Qty: 30 | Fill #2

## 2016-04-06 MED FILL — TERBINAFINE HCL 250 MG TAB: 250 | 30 days supply | Qty: 30 | Fill #0

## 2016-04-06 MED FILL — FUROSEMIDE 20 MG TABLET: 20 | 30 days supply | Qty: 30 | Fill #7

## 2016-04-07 MED FILL — CARVEDILOL 3.125 MG TABLET: 3.125 | 30 days supply | Qty: 60 | Fill #0

## 2016-04-08 MED FILL — AMLODIPINE BESYLATE 10 MG T: 10 | 30 days supply | Qty: 30 | Fill #0

## 2016-05-09 ENCOUNTER — Ambulatory Visit: Payer: Medicaid Other | Admitting: Family Medicine

## 2016-05-09 MED FILL — TERBINAFINE HCL 250 MG TAB: 250 | 30 days supply | Qty: 30 | Fill #1

## 2016-05-09 MED FILL — FUROSEMIDE 20 MG TABLET: 20 | 30 days supply | Qty: 30 | Fill #8

## 2016-05-09 MED FILL — AMLODIPINE BESYLATE 10 MG T: 10 | 30 days supply | Qty: 30 | Fill #1

## 2016-05-09 MED FILL — metFORMIN HCL 500 MG TABS: 500 | 30 days supply | Qty: 60 | Fill #6

## 2016-05-10 ENCOUNTER — Other Ambulatory Visit: Payer: Self-pay | Admitting: Podiatry

## 2016-05-23 MED FILL — FAMOTIDINE 20 MG TABLET: 20 | 30 days supply | Qty: 60 | Fill #6

## 2016-05-23 MED FILL — GABAPENTIN 300 MG CAPSULE: 300 | 30 days supply | Qty: 90 | Fill #2

## 2016-05-25 MED FILL — MELOXICAM 15 MG TABLET: 15 | 30 days supply | Qty: 30 | Fill #0

## 2016-06-01 ENCOUNTER — Other Ambulatory Visit: Payer: Self-pay | Admitting: Internal Medicine

## 2016-06-01 MED FILL — CARVEDILOL 3.125 MG TABLET: 3.125 | 30 days supply | Qty: 60 | Fill #0

## 2016-06-03 ENCOUNTER — Ambulatory Visit: Payer: Medicaid Other | Admitting: Podiatry

## 2016-06-09 ENCOUNTER — Ambulatory Visit: Payer: Medicaid Other | Admitting: Internal Medicine

## 2016-06-09 MED FILL — AMLODIPINE BESYLATE 10 MG T: 10 | 30 days supply | Qty: 30 | Fill #2

## 2016-06-09 MED FILL — TERBINAFINE HCL 250 MG TAB: 250 | 30 days supply | Qty: 30 | Fill #2

## 2016-06-10 ENCOUNTER — Ambulatory Visit: Payer: Medicaid Other | Admitting: Podiatry

## 2016-06-10 MED FILL — FUROSEMIDE 20 MG TABLET: 20 | 30 days supply | Qty: 30 | Fill #9

## 2016-06-29 MED FILL — metFORMIN HCL 500 MG TABS: 500 | 30 days supply | Qty: 60 | Fill #7

## 2016-07-05 MED FILL — FAMOTIDINE 20 MG TABLET: 20 | 30 days supply | Qty: 60 | Fill #7

## 2016-07-05 MED FILL — FUROSEMIDE 20 MG TABLET: 20 | 30 days supply | Qty: 30 | Fill #10

## 2016-07-05 MED FILL — AMLODIPINE BESYLATE 10 MG T: 10 | 30 days supply | Qty: 30 | Fill #3

## 2016-07-11 ENCOUNTER — Other Ambulatory Visit: Payer: Self-pay | Admitting: Internal Medicine

## 2016-07-11 MED FILL — CARVEDILOL 3.125 MG TABLET: 3.125 | 30 days supply | Qty: 60 | Fill #0

## 2016-07-11 MED FILL — TERBINAFINE HCL 250 MG TAB: 250 | 30 days supply | Qty: 30 | Fill #3

## 2016-07-12 ENCOUNTER — Emergency Department (HOSPITAL_COMMUNITY)
Admission: EM | Admit: 2016-07-12 | Discharge: 2016-07-12 | Disposition: A | Discharge status: Home / Self Care | Payer: Medicaid Other | Attending: Emergency Medicine | Admitting: Emergency Medicine

## 2016-07-12 ENCOUNTER — Encounter (HOSPITAL_COMMUNITY): Payer: Self-pay | Admitting: Emergency Medicine

## 2016-07-12 ENCOUNTER — Ambulatory Visit (HOSPITAL_COMMUNITY): Payer: Medicaid Other | Admitting: Nurse Practitioner

## 2016-07-12 ENCOUNTER — Emergency Department (HOSPITAL_COMMUNITY): Payer: Medicaid Other

## 2016-07-12 DIAGNOSIS — Z7901 Long term (current) use of anticoagulants: Secondary | ICD-10-CM | POA: Insufficient documentation

## 2016-07-12 DIAGNOSIS — R0981 Nasal congestion: Secondary | ICD-10-CM

## 2016-07-12 DIAGNOSIS — Z79899 Other long term (current) drug therapy: Secondary | ICD-10-CM | POA: Diagnosis not present

## 2016-07-12 DIAGNOSIS — Z7984 Long term (current) use of oral hypoglycemic drugs: Secondary | ICD-10-CM | POA: Insufficient documentation

## 2016-07-12 DIAGNOSIS — E1142 Type 2 diabetes mellitus with diabetic polyneuropathy: Secondary | ICD-10-CM | POA: Insufficient documentation

## 2016-07-12 DIAGNOSIS — J069 Acute upper respiratory infection, unspecified: Secondary | ICD-10-CM | POA: Insufficient documentation

## 2016-07-12 DIAGNOSIS — I10 Essential (primary) hypertension: Secondary | ICD-10-CM | POA: Diagnosis not present

## 2016-07-12 MED ORDER — LORATADINE 10 MG PO TABS: 10.0000 mg | ORAL_TABLET | Freq: Every day | ORAL | 0 refills | Status: DC

## 2016-07-12 MED ORDER — BENZONATATE 100 MG PO CAPS: 100.0000 mg | ORAL_CAPSULE | Freq: Three times a day (TID) | ORAL | 0 refills | Status: DC

## 2016-07-12 NOTE — ED Triage Notes (Addendum)
Pt. reports persistent dry cough with nasal congestion / chest congestion onset yesterday , denies fever or chills /respirations unlabored . Hypertensive at triage .

## 2016-07-12 NOTE — Discharge Instructions (Signed)
You have been seen today for a cough and congestion. Your imaging showed no abnormalities. Your symptoms are consistent with a viral illness. Viruses do not require antibiotics. Treatment is symptomatic care. Drink plenty of fluids and get plenty of rest. You should be drinking at least a liter of water an hour to stay hydrated. Tylenol for pain. Tessalon for cough. Plain Mucinex may help relieve congestion. Warm liquids or Chloraseptic spray may help soothe the sore throat. Follow up with PCP within the next few days for reevaluation. Return to ED should symptoms worsen.

## 2016-07-12 NOTE — ED Provider Notes (Signed)
[facility]-EMERGENCY DEPT Provider Note   CSN: [id number] Arrival date & time: 07/12/16  0605     History   Chief Complaint Chief Complaint  Patient presents with  . Nasal Congestion  . Cough    HPI Jane Arellano is a 55 y.o. female.  HPI   Jane Arellano is a 55 y.o. female, DM and hypertension, presenting to the ED with nasal congestion and dry cough that has been intermittent for the past two days, but worse yesterday. Has tried one dose of bendadryl last night with some relief. No coughing today. States these symptoms recur "whenever the weather changes." Denies SOB, chest pain, fever/chills, N/V, or any other complaints.  Past Medical History:  Diagnosis Date  . Atypical chest pain    a. 11/2005 Negative Myoview  . Diastolic dysfunction   . DM2 (diabetes mellitus, type 2) (HCC)   . Dysrhythmia    AFIB HX CV   . GERD (gastroesophageal reflux disease)   . Heart murmur   . History of substance abuse   . HTN (hypertension)   . Iron deficiency anemia   . Paroxysmal atrial fibrillation (HCC) 05/01/2013   On Xarelto  . Subaortic membrane    a. 01/2010 Echo: EF 55-60%, No rwma, subaortic membrane with elevated LVOT mean gradient of 21 mmHg, Triv AI, mod dil LA, mildly increased PASP. b. 05/02/2013 Echo:  LVEF 60-65%, grade 1 diastolic dysfunction, mild LVH, subaortic stenosis w/ turbulation in LVOT c/w subaortic membrane (mean gradient 42 mmHg/peak gradient 81 mmHg), mild biatrial enlargement    Patient Active Problem List   Diagnosis Date Noted  . Diabetic polyneuropathy associated with type 2 diabetes mellitus (HCC) 01/14/2016  . Diabetic eye exam (HCC) 01/14/2016  . Screening for colorectal cancer 01/14/2016  . Neck pain 06/29/2015  . Type 2 diabetes mellitus without complication (HCC) 03/16/2015  . Essential hypertension 03/16/2015  . BV (bacterial vaginosis) 12/11/2014  . Screening for colon cancer 12/10/2014  . Keloid scar 12/10/2014  . Screening for STD (sexually  transmitted disease) 12/10/2014  . Onychomycosis of toenail 12/10/2014  . Cerumen impaction 12/10/2014  . Diabetes mellitus type 2 with neurological manifestations (HCC) 08/14/2014  . Blurry vision, bilateral 08/14/2014  . PAF (paroxysmal atrial fibrillation) (HCC) 05/08/2013  . ETOH abuse 05/02/2013  . Marijuana abuse 05/02/2013  . Diastolic dysfunction 05/02/2013  . Depression with anxiety 05/01/2013  . Subaortic membrane   . HTN (hypertension)   . 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 GERD 04/30/2007    Past Surgical History:  Procedure Laterality Date  . CARDIOVERSION N/A 11/04/2013   Procedure: CARDIOVERSION;  Surgeon: Peter M [place], MD;  Location: [facility] ENDOSCOPY;  Service: Cardiovascular;  Laterality: N/A;  . CESAREAN SECTION      OB History    No data available       Home Medications    Prior to Admission medications   Medication Sig Start Date End Date Taking? Authorizing Provider  ACCU-CHEK SOFTCLIX LANCETS lancets Use as instructed 01/04/16   Quentin Angst, MD  amitriptyline (ELAVIL) 50 MG tablet Take 1 tablet (50 mg total) by mouth at bedtime. 01/14/16   Quentin Angst, MD  amLODipine (NORVASC) 10 MG tablet TAKE 1 TABLET BY MOUTH DAILY. 04/06/16   Quentin Angst, MD  antiseptic oral rinse (BIOTENE) LIQD 15 mLs by Mouth Rinse route daily.    Historical Provider, MD  benzonatate (TESSALON) 100 MG capsule Take 1 capsule (100 mg total) by mouth every 8 (eight) hours.  07/12/16   Lizzy Hamre C Mell Guia, PA-C  Blood Glucose Calibration (ACCU-CHEK AVIVA) SOLN USE AS INSTRUCTED 01/11/16   Tresa Garter, MD  Blood Glucose Monitoring Suppl (ACCU-CHEK AVIVA PLUS) w/Device KIT USE AS DIRECTED TWICE DAILY AT 8 AM AND 10 PM 01/04/16   Tresa Garter, MD  Calcium Carb-Cholecalciferol (CALCIUM-VITAMIN D3) 500-400 MG-UNIT TABS Take 1 tablet by mouth daily.    Historical Provider, MD  carbamide peroxide (DEBROX) 6.5 % otic solution Place 5 drops into both ears 2  (two) times daily. Patient taking differently: Place 5 drops into both ears 2 (two) times daily as needed (for ear wax).  12/10/14   Josalyn Funches, MD  carvedilol (COREG) 3.125 MG tablet TAKE 1 TABLET BY MOUTH 2 TIMES DAILY. 07/11/16   Deboraha Sprang, MD  dabigatran (PRADAXA) 150 MG CAPS capsule Take 1 capsule (150 mg total) by mouth 2 (two) times daily. 07/23/15   Deboraha Sprang, MD  famotidine (PEPCID) 20 MG tablet Take 1 tablet (20 mg total) by mouth 2 (two) times daily. 09/04/15   Tresa Garter, MD  ferrous sulfate 325 (65 FE) MG tablet Take 1 tablet (325 mg total) by mouth 3 (three) times daily with meals. 07/31/13   Liliane Shi, PA-C  fish oil-omega-3 fatty acids 1000 MG capsule Take 1 g by mouth daily.    Historical Provider, MD  furosemide (LASIX) 20 MG tablet Take 1 tablet (20 mg total) by mouth daily. 09/04/15   Tresa Garter, MD  gabapentin (NEURONTIN) 300 MG capsule Take 1 capsule (300 mg total) by mouth 3 (three) times daily. 01/14/16   Tresa Garter, MD  GAVILAX powder MIX 1 CAPFUL WITH LIQUID AND DRINK ONCE DAILY 02/05/16   Lance Bosch, NP  glucose blood (ACCU-CHEK AVIVA PLUS) test strip Use as instructed 01/04/16   Tresa Garter, MD  loratadine (CLARITIN) 10 MG tablet Take 1 tablet (10 mg total) by mouth daily. 07/12/16   Alexsis Kathman C Jodeci Roarty, PA-C  meloxicam (MOBIC) 15 MG tablet TAKE 1 TABLET BY MOUTH DAILY 05/10/16   Gardiner Barefoot, DPM  metFORMIN (GLUCOPHAGE) 500 MG tablet Take 1 tablet (500 mg total) by mouth 2 (two) times daily with a meal. 09/04/15   Tresa Garter, MD  Multiple Vitamin (MULTIVITAMIN WITH MINERALS) TABS tablet Take 1 tablet by mouth daily.    Historical Provider, MD  terbinafine (LAMISIL) 250 MG tablet TAKE 1 TABLET BY MOUTH DAILY. 04/06/16   Tresa Garter, MD    Family History Family History  Problem Relation Age of Onset  . Diabetes Father     deceased  . Cancer Mother 44    colon  . Stroke Mother   . Hypertension Sister      alive and well  . Hypertension Sister     alive and well  . Heart attack Brother     deceased @ 64    Social History Social History  Substance Use Topics  . Smoking status: Never Smoker  . Smokeless tobacco: Current User    Types: Snuff  . Alcohol use No     Comment: Drinks 3 - 40 oz beers daily.  i QUIT DOING THAT "     Allergies   Xarelto [rivaroxaban] and Ketorolac tromethamine   Review of Systems Review of Systems  Constitutional: Negative for chills and fever.  HENT: Positive for congestion. Negative for facial swelling and trouble swallowing.   Respiratory: Positive for cough. Negative for shortness of breath.  Cardiovascular: Negative for chest pain.  Gastrointestinal: Negative for abdominal pain, nausea and vomiting.  All other systems reviewed and are negative.    Physical Exam Updated Vital Signs BP 176/82 (BP Location: Left Arm)   Pulse 73   Temp 97.7 F (36.5 C) (Oral)   Resp 17   Ht '5\' 7"'$  (1.702 m)   Wt 89.9 kg   SpO2 100%   BMI 31.05 kg/m   Physical Exam  Constitutional: She appears well-developed and well-nourished. No distress.  HENT:  Head: Normocephalic and atraumatic.  Eyes: Conjunctivae are normal.  Neck: Neck supple.  Cardiovascular: Normal rate, regular rhythm, normal heart sounds and intact distal pulses.   Pulmonary/Chest: Effort normal and breath sounds normal. No respiratory distress.  Abdominal: Soft. There is no tenderness. There is no guarding.  Musculoskeletal: She exhibits no edema or tenderness.  Lymphadenopathy:    She has no cervical adenopathy.  Neurological: She is alert.  Skin: Skin is warm and dry. She is not diaphoretic.  Psychiatric: She has a normal mood and affect. Her behavior is normal.  Nursing note and vitals reviewed.    ED Treatments / Results  Labs (all labs ordered are listed, but only abnormal results are displayed) Labs Reviewed - No data to display  EKG  EKG Interpretation None        Radiology Dg Chest 2 View  Result Date: 07/12/2016 CLINICAL DATA:  Persisting cough and chest congestion. EXAM: CHEST  2 VIEW COMPARISON:  06/17/2015 FINDINGS: Patient is post median sternotomy. The cardiomediastinal contours are normal. The lungs are clear. Pulmonary vasculature is normal. No consolidation, pleural effusion, or pneumothorax. No acute osseous abnormalities are seen. IMPRESSION: No active cardiopulmonary disease. Electronically Signed   By: Jeb Levering M.D.   On: 07/12/2016 06:35    Procedures Procedures (including critical care time)  Medications Ordered in ED Medications - No data to display   Initial Impression / Assessment and Plan / ED Course  I have reviewed the triage vital signs and the nursing notes.  Pertinent labs & imaging results that were available during my care of the patient were reviewed by me and considered in my medical decision making (see chart for details).  Clinical Course    Jane Arellano presents with dry cough and congestion for the last 2 days.  Patient's symptoms and presentation are consistent with a viral URI versus allergic rhinitis and seasonal allergies. Patient is nontoxic appearing, afebrile, not tachycardic, not tachypneic, not hypotensive, maintains SPO2 of 100% on room air, and is in no apparent distress. Patient has no signs of sepsis or other serious or life-threatening condition. No indication for further testing or imaging at this time. The patient was given instructions for home care as well as return precautions. Patient voices understanding of these instructions, accepts the plan, and is comfortable with discharge.   Vitals:   07/12/16 0609 07/12/16 0610 07/12/16 0747  BP:  176/82 133/73  Pulse:  73 62  Resp:  17 16  Temp:  97.7 F (36.5 C)   TempSrc:  Oral   SpO2:  100% 100%  Weight: 89.9 kg    Height: '5\' 7"'$  (1.702 m)       Final Clinical Impressions(s) / ED Diagnoses   Final diagnoses:  URI (upper  respiratory infection)  Nasal congestion    New Prescriptions Discharge Medication List as of 07/12/2016  7:38 AM    START taking these medications   Details  benzonatate (TESSALON) 100 MG capsule  Take 1 capsule (100 mg total) by mouth every 8 (eight) hours., Starting Tue 07/12/2016, Print    loratadine (CLARITIN) 10 MG tablet Take 1 tablet (10 mg total) by mouth daily., Starting Tue 07/12/2016, Print         Lorayne Bender, PA-C 07/12/16 Cesar Chavez, MD 07/16/16 2347

## 2016-07-15 ENCOUNTER — Ambulatory Visit (HOSPITAL_COMMUNITY)
Admission: EM | Admit: 2016-07-15 | Discharge: 2016-07-15 | Disposition: A | Discharge status: Home / Self Care | Payer: Medicaid Other | Attending: Family Medicine | Admitting: Family Medicine

## 2016-07-15 ENCOUNTER — Encounter (HOSPITAL_COMMUNITY): Payer: Self-pay | Admitting: Family Medicine

## 2016-07-15 DIAGNOSIS — R05 Cough: Secondary | ICD-10-CM | POA: Diagnosis not present

## 2016-07-15 DIAGNOSIS — R059 Cough, unspecified: Secondary | ICD-10-CM

## 2016-07-15 MED ORDER — AZITHROMYCIN 250 MG PO TABS: ORAL_TABLET | ORAL | 0 refills | Status: DC

## 2016-07-15 MED ORDER — HYDROCODONE-ACETAMINOPHEN 7.5-325 MG/15ML PO SOLN: 10.0000 mL | Freq: Four times a day (QID) | ORAL | 0 refills | Status: DC | PRN

## 2016-07-15 MED FILL — AZITHROMYCIN 250 MG TABLET: 250 | 5 days supply | Qty: 6 | Fill #0

## 2016-07-15 MED FILL — HYDROCOD-APAP 7.5-325/15ML: 7.5-325 | 3 days supply | Qty: 120 | Fill #0

## 2016-07-15 NOTE — ED Provider Notes (Signed)
CSN: [id number]     Arrival date & time 07/15/16  1000 History   None    Chief Complaint  Patient presents with  . Cough   (Consider location/radiation/quality/duration/timing/severity/associated sxs/prior Treatment) HPI 55 year old female with cough for almost 2 weeks. Has been seen in the emergency department treated symptomatically. States that her symptoms are not any better. Patient is requesting a stronger. Cough medicine and an antibiotic. Past Medical History:  Diagnosis Date  . Atypical chest pain    a. 11/2005 Negative Myoview  . Diastolic dysfunction   . DM2 (diabetes mellitus, type 2) ([place])   . Dysrhythmia    AFIB HX CV   . GERD (gastroesophageal reflux disease)   . Heart murmur   . History of substance abuse   . HTN (hypertension)   . Iron deficiency anemia   . Paroxysmal atrial fibrillation ([place]) 05/01/2013   On Xarelto  . Subaortic membrane    a. 01/2010 Echo: EF 55-60%, No rwma, subaortic membrane with elevated LVOT mean gradient of 21 mmHg, Triv AI, mod dil LA, mildly increased PASP. b. 05/02/2013 Echo:  LVEF 10-17%, grade 1 diastolic dysfunction, mild LVH, subaortic stenosis w/ turbulation in LVOT c/w subaortic membrane (mean gradient 42 mmHg/peak gradient 81 mmHg), mild biatrial enlargement   Past Surgical History:  Procedure Laterality Date  . CARDIOVERSION N/A 11/04/2013   Procedure: CARDIOVERSION;  Surgeon: Peter M Martinique, MD;  Location: [facility] ENDOSCOPY;  Service: Cardiovascular;  Laterality: N/A;  . CESAREAN SECTION     Family History  Problem Relation Age of Onset  . Diabetes Father     deceased  . Cancer Mother 21    colon  . Stroke Mother   . Hypertension Sister     alive and well  . Hypertension Sister     alive and well  . Heart attack Brother     deceased @ 101   Social History  Substance Use Topics  . Smoking status: Never Smoker  . Smokeless tobacco: Current User    Types: Snuff  . Alcohol use No     Comment: Drinks 3 - 40 oz beers daily.   i QUIT DOING THAT "   OB History    No data available     Review of Systems  Denies: HEADACHE, NAUSEA, ABDOMINAL PAIN, CHEST PAIN, CONGESTION, DYSURIA, SHORTNESS OF BREATH  Allergies  Xarelto [rivaroxaban] and Ketorolac tromethamine  Home Medications   Prior to Admission medications   Medication Sig Start Date End Date Taking? Authorizing Provider  ACCU-CHEK SOFTCLIX LANCETS lancets Use as instructed 01/04/16   Tresa Garter, MD  amitriptyline (ELAVIL) 50 MG tablet Take 1 tablet (50 mg total) by mouth at bedtime. 01/14/16   Tresa Garter, MD  amLODipine (NORVASC) 10 MG tablet TAKE 1 TABLET BY MOUTH DAILY. 04/06/16   Tresa Garter, MD  antiseptic oral rinse (BIOTENE) LIQD 15 mLs by Mouth Rinse route daily.    Historical Provider, MD  azithromycin (ZITHROMAX) 250 MG tablet Take first 2 tablets together, then 1 every day until finished. 07/15/16   Konrad Felix, PA  azithromycin (ZITHROMAX) 250 MG tablet Take first 2 tablets together, then 1 every day until finished. 07/15/16   Konrad Felix, PA  benzonatate (TESSALON) 100 MG capsule Take 1 capsule (100 mg total) by mouth every 8 (eight) hours. 07/12/16   Shawn C Joy, PA-C  Blood Glucose Calibration (ACCU-CHEK AVIVA) SOLN USE AS INSTRUCTED 01/11/16   Tresa Garter, MD  Blood Glucose  Monitoring Suppl (ACCU-CHEK AVIVA PLUS) w/Device KIT USE AS DIRECTED TWICE DAILY AT 8 AM AND 10 PM 01/04/16   Tresa Garter, MD  Calcium Carb-Cholecalciferol (CALCIUM-VITAMIN D3) 500-400 MG-UNIT TABS Take 1 tablet by mouth daily.    Historical Provider, MD  carbamide peroxide (DEBROX) 6.5 % otic solution Place 5 drops into both ears 2 (two) times daily. Patient taking differently: Place 5 drops into both ears 2 (two) times daily as needed (for ear wax).  12/10/14   Josalyn Funches, MD  carvedilol (COREG) 3.125 MG tablet TAKE 1 TABLET BY MOUTH 2 TIMES DAILY. 07/11/16   Deboraha Sprang, MD  dabigatran (PRADAXA) 150 MG CAPS capsule Take 1  capsule (150 mg total) by mouth 2 (two) times daily. 07/23/15   Deboraha Sprang, MD  famotidine (PEPCID) 20 MG tablet Take 1 tablet (20 mg total) by mouth 2 (two) times daily. 09/04/15   Tresa Garter, MD  ferrous sulfate 325 (65 FE) MG tablet Take 1 tablet (325 mg total) by mouth 3 (three) times daily with meals. 07/31/13   Liliane Shi, PA-C  fish oil-omega-3 fatty acids 1000 MG capsule Take 1 g by mouth daily.    Historical Provider, MD  furosemide (LASIX) 20 MG tablet Take 1 tablet (20 mg total) by mouth daily. 09/04/15   Tresa Garter, MD  gabapentin (NEURONTIN) 300 MG capsule Take 1 capsule (300 mg total) by mouth 3 (three) times daily. 01/14/16   Tresa Garter, MD  GAVILAX powder MIX 1 CAPFUL WITH LIQUID AND DRINK ONCE DAILY 02/05/16   Lance Bosch, NP  glucose blood (ACCU-CHEK AVIVA PLUS) test strip Use as instructed 01/04/16   Tresa Garter, MD  HYDROcodone-acetaminophen (HYCET) 7.5-325 mg/15 ml solution Take 10 mLs by mouth 4 (four) times daily as needed for moderate pain. 07/15/16   Konrad Felix, PA  loratadine (CLARITIN) 10 MG tablet Take 1 tablet (10 mg total) by mouth daily. 07/12/16   Shawn C Joy, PA-C  meloxicam (MOBIC) 15 MG tablet TAKE 1 TABLET BY MOUTH DAILY 05/10/16   Gardiner Barefoot, DPM  metFORMIN (GLUCOPHAGE) 500 MG tablet Take 1 tablet (500 mg total) by mouth 2 (two) times daily with a meal. 09/04/15   Tresa Garter, MD  Multiple Vitamin (MULTIVITAMIN WITH MINERALS) TABS tablet Take 1 tablet by mouth daily.    Historical Provider, MD  terbinafine (LAMISIL) 250 MG tablet TAKE 1 TABLET BY MOUTH DAILY. 04/06/16   Tresa Garter, MD   Meds Ordered and Administered this Visit  Medications - No data to display  BP 178/89   Pulse 62   Temp 98.1 F (36.7 C)   Resp 18   SpO2 98%  No data found.   Physical Exam NURSES NOTES AND VITAL SIGNS REVIEWED. CONSTITUTIONAL: Well developed, well nourished, no acute distress HEENT: normocephalic,  atraumatic EYES: Conjunctiva normal NECK:normal ROM, supple, no adenopathy PULMONARY:No respiratory distress, normal effort ABDOMINAL: Soft, ND, NT BS+, No CVAT MUSCULOSKELETAL: Normal ROM of all extremities,  SKIN: warm and dry without rash PSYCHIATRIC: Mood and affect, behavior are normal  Urgent Care Course   Clinical Course    Procedures (including critical care time)  Labs Review Labs Reviewed - No data to display  Imaging Review No results found.   Visual Acuity Review  Right Eye Distance:   Left Eye Distance:   Bilateral Distance:    Right Eye Near:   Left Eye Near:    Bilateral Near:  Prescription for azithromycin and Hycet are provided to the patient. Discussion that symptoms may be completely viral has been done with the patient also. MDM   1. Cough     Patient is reassured that there are no issues that require transfer to higher level of care at this time or additional tests. Patient is advised to continue home symptomatic treatment. Patient is advised that if there are new or worsening symptoms to attend the emergency department, contact primary care provider, or return to UC. Instructions of care provided discharged home in stable condition.    THIS NOTE WAS GENERATED USING A VOICE RECOGNITION SOFTWARE PROGRAM. ALL REASONABLE EFFORTS  WERE MADE TO PROOFREAD THIS DOCUMENT FOR ACCURACY.  I have verbally reviewed the discharge instructions with the patient. A printed AVS was given to the patient.  All questions were answered prior to discharge.      Konrad Felix, [place] 07/15/16 [phone]

## 2016-07-15 NOTE — ED Triage Notes (Signed)
Pt here for non productive cough with congestion.

## 2016-07-20 ENCOUNTER — Inpatient Hospital Stay: Payer: Medicaid Other

## 2016-07-26 MED FILL — GABAPENTIN 300 MG CAPSULE: 300 | 30 days supply | Qty: 90 | Fill #3

## 2016-07-26 MED FILL — MELOXICAM 15 MG TABLET: 15 | 30 days supply | Qty: 30 | Fill #1

## 2016-08-05 MED FILL — AMLODIPINE BESYLATE 10 MG T: 10 | 30 days supply | Qty: 30 | Fill #4

## 2016-08-05 MED FILL — ACCU-CHEK AVIVA PLUS TEST S: 30 days supply | Qty: 100 | Fill #1

## 2016-08-05 MED FILL — metFORMIN HCL 500 MG TABS: 500 | 30 days supply | Qty: 60 | Fill #8

## 2016-08-05 MED FILL — FUROSEMIDE 20 MG TABLET: 20 | 30 days supply | Qty: 30 | Fill #11

## 2016-08-10 MED FILL — CARVEDILOL 3.125 MG TABLET: 3.125 | 30 days supply | Qty: 60 | Fill #1

## 2016-08-10 MED FILL — FAMOTIDINE 20 MG TABLET: 20 | 30 days supply | Qty: 60 | Fill #8

## 2016-08-16 ENCOUNTER — Telehealth: Payer: Self-pay

## 2016-08-16 MED ORDER — DABIGATRAN ETEXILATE MESYLATE 150 MG PO CAPS: 150.0000 mg | ORAL_CAPSULE | Freq: Two times a day (BID) | ORAL | 3 refills | Status: DC

## 2016-08-16 NOTE — Telephone Encounter (Signed)
Renewal application for Pradaxa filled out along with Rx printed.

## 2016-08-18 ENCOUNTER — Encounter: Payer: Self-pay | Admitting: Internal Medicine

## 2016-08-18 ENCOUNTER — Ambulatory Visit: Payer: Medicaid Other | Attending: Internal Medicine | Admitting: Internal Medicine

## 2016-08-18 VITALS — BP 144/84 | HR 81 | Temp 97.4°F | Resp 16 | Wt 200.0 lb

## 2016-08-18 DIAGNOSIS — E118 Type 2 diabetes mellitus with unspecified complications: Secondary | ICD-10-CM | POA: Diagnosis present

## 2016-08-18 DIAGNOSIS — K219 Gastro-esophageal reflux disease without esophagitis: Secondary | ICD-10-CM | POA: Insufficient documentation

## 2016-08-18 DIAGNOSIS — F418 Other specified anxiety disorders: Secondary | ICD-10-CM | POA: Diagnosis not present

## 2016-08-18 DIAGNOSIS — Z79899 Other long term (current) drug therapy: Secondary | ICD-10-CM | POA: Insufficient documentation

## 2016-08-18 DIAGNOSIS — I10 Essential (primary) hypertension: Secondary | ICD-10-CM | POA: Diagnosis not present

## 2016-08-18 DIAGNOSIS — I480 Paroxysmal atrial fibrillation: Secondary | ICD-10-CM | POA: Diagnosis not present

## 2016-08-18 DIAGNOSIS — Z124 Encounter for screening for malignant neoplasm of cervix: Secondary | ICD-10-CM | POA: Insufficient documentation

## 2016-08-18 DIAGNOSIS — Z888 Allergy status to other drugs, medicaments and biological substances status: Secondary | ICD-10-CM | POA: Insufficient documentation

## 2016-08-18 DIAGNOSIS — E1142 Type 2 diabetes mellitus with diabetic polyneuropathy: Secondary | ICD-10-CM | POA: Insufficient documentation

## 2016-08-18 DIAGNOSIS — Z23 Encounter for immunization: Secondary | ICD-10-CM | POA: Diagnosis not present

## 2016-08-18 DIAGNOSIS — Z1211 Encounter for screening for malignant neoplasm of colon: Secondary | ICD-10-CM

## 2016-08-18 LAB — CBC WITH DIFFERENTIAL/PLATELET
BASOS PCT: 0 %
Basophils Absolute: 0 cells/uL (ref 0–200)
EOS PCT: 1 %
Eosinophils Absolute: 41 cells/uL (ref 15–500)
HEMATOCRIT: 37.2 % (ref 35.0–45.0)
HEMOGLOBIN: 12.2 g/dL (ref 11.7–15.5)
LYMPHS ABS: 1271 {cells}/uL (ref 850–3900)
Lymphocytes Relative: 31 %
MCH: 32.4 pg (ref 27.0–33.0)
MCHC: 32.8 g/dL (ref 32.0–36.0)
MCV: 98.9 fL (ref 80.0–100.0)
MONO ABS: 246 {cells}/uL (ref 200–950)
MPV: 12.5 fL (ref 7.5–12.5)
Monocytes Relative: 6 %
NEUTROS ABS: 2542 {cells}/uL (ref 1500–7800)
Neutrophils Relative %: 62 %
Platelets: 195 10*3/uL (ref 140–400)
RBC: 3.76 MIL/uL — AB (ref 3.80–5.10)
RDW: 13.4 % (ref 11.0–15.0)
WBC: 4.1 10*3/uL (ref 3.8–10.8)

## 2016-08-18 LAB — LIPID PANEL
CHOL/HDL RATIO: 3.2 ratio (ref <=5.0)
Cholesterol: 127 mg/dL (ref 125–200)
HDL: 40 mg/dL — ABNORMAL LOW (ref >=46)
Triglycerides: 453 mg/dL — ABNORMAL HIGH (ref <150)

## 2016-08-18 LAB — COMPLETE METABOLIC PANEL WITH GFR
ALBUMIN: 4.4 g/dL (ref 3.6–5.1)
ALK PHOS: 56 U/L (ref 33–130)
ALT: 20 U/L (ref 6–29)
AST: 19 U/L (ref 10–35)
BILIRUBIN TOTAL: 0.3 mg/dL (ref 0.2–1.2)
BUN: 8 mg/dL (ref 7–25)
CO2: 17 mmol/L — ABNORMAL LOW (ref 20–31)
CREATININE: 0.64 mg/dL (ref 0.50–1.05)
Calcium: 9.7 mg/dL (ref 8.6–10.4)
Chloride: 105 mmol/L (ref 98–110)
GFR, Est African American: >89 mL/min (ref >=60)
GFR, Est Non African American: >89 mL/min (ref >=60)
GLUCOSE: 225 mg/dL — AB (ref 65–99)
POTASSIUM: 3.7 mmol/L (ref 3.5–5.3)
SODIUM: 142 mmol/L (ref 135–146)
TOTAL PROTEIN: 7.6 g/dL (ref 6.1–8.1)

## 2016-08-18 LAB — GLUCOSE, POCT (MANUAL RESULT ENTRY): POC GLUCOSE: 240 mg/dL — AB (ref 70–99)

## 2016-08-18 LAB — TSH: TSH: 1.5 m[IU]/L

## 2016-08-18 LAB — POCT GLYCOSYLATED HEMOGLOBIN (HGB A1C): HEMOGLOBIN A1C: 7.1

## 2016-08-18 MED ORDER — HYDROXYZINE HCL 25 MG PO TABS: 25.0000 mg | ORAL_TABLET | Freq: Three times a day (TID) | ORAL | 2 refills | Status: DC | PRN

## 2016-08-18 MED ORDER — AMITRIPTYLINE HCL 50 MG PO TABS: 50.0000 mg | ORAL_TABLET | Freq: Every day | ORAL | 3 refills | Status: DC

## 2016-08-18 MED FILL — HYDROXYZINE PAM 25 MG CAP: 25 | 20 days supply | Qty: 60 | Fill #0

## 2016-08-18 MED FILL — AMITRIPTYLINE HCL 50 MG TAB: 50 | 30 days supply | Qty: 30 | Fill #0

## 2016-08-18 NOTE — Progress Notes (Signed)
Jane Arellano, is a 55 y.o. female  WGN:[id number]  HQI:[id number]  DOB - [birth year]  Chief Complaint  Patient presents with  . Hypertension  . Diabetes      Subjective:   Jane Arellano is a 55 y.o. female with history of paroxysmal atrial fibrillation on Pradaxa and carvedilol, hypertension, type 2 diabetes mellitus, diastolic heart dysfunction, subaortic membrane status post resection in 2015 here today for a follow up visit and medication refill. She has no major complaint today but ongoing pain related to her diabetic neuropathy. She also needs refill of her anxiety medication and elavil. Patient now lives in a good neighborhood, no more fear and her anxiety level has gone down significantly. Patient has No headache, No chest pain, No abdominal pain - No Nausea, No new weakness tingling or numbness, No Cough - SOB. Patient did not go for her scheduled colonoscopy, she asked if this can be reordered and scheduled for her.   Problem  Encounter for Immunization  Type 2 Diabetes Mellitus With Complication, Without Long-Term Current Use of Insulin (Hcc)   ALLERGIES: Allergies  Allergen Reactions  . Xarelto [Rivaroxaban] Swelling    Gums bled, headaches  . Ketorolac Tromethamine Other (See Comments)    Unknown reaction    PAST MEDICAL HISTORY: Past Medical History:  Diagnosis Date  . Atypical chest pain    a. 11/2005 Negative Myoview  . Diastolic dysfunction   . DM2 (diabetes mellitus, type 2) ([place])   . Dysrhythmia    AFIB HX CV   . GERD (gastroesophageal reflux disease)   . Heart murmur   . History of substance abuse   . HTN (hypertension)   . Iron deficiency anemia   . Paroxysmal atrial fibrillation ([place]) 05/01/2013   On Xarelto  . Subaortic membrane    a. 01/2010 Echo: EF 55-60%, No rwma, subaortic membrane with elevated LVOT mean gradient of 21 mmHg, Triv AI, mod dil LA, mildly increased PASP. b. 05/02/2013 Echo:  LVEF 13-24%, grade 1 diastolic dysfunction, mild LVH,  subaortic stenosis w/ turbulation in LVOT c/w subaortic membrane (mean gradient 42 mmHg/peak gradient 81 mmHg), mild biatrial enlargement    MEDICATIONS AT HOME: Prior to Admission medications   Medication Sig Start Date End Date Taking? Authorizing Provider  ACCU-CHEK SOFTCLIX LANCETS lancets Use as instructed 01/04/16  Yes Tresa Garter, MD  amitriptyline (ELAVIL) 50 MG tablet Take 1 tablet (50 mg total) by mouth at bedtime. 08/18/16  Yes Wylodean Shimmel E Doreene Burke, MD  amLODipine (NORVASC) 10 MG tablet TAKE 1 TABLET BY MOUTH DAILY. 04/06/16  Yes Tresa Garter, MD  Blood Glucose Calibration (ACCU-CHEK AVIVA) SOLN USE AS INSTRUCTED 01/11/16  Yes Tresa Garter, MD  Blood Glucose Monitoring Suppl (ACCU-CHEK AVIVA PLUS) w/Device KIT USE AS DIRECTED TWICE DAILY AT 8 AM AND 10 PM 01/04/16  Yes Tresa Garter, MD  Calcium Carb-Cholecalciferol (CALCIUM-VITAMIN D3) 500-400 MG-UNIT TABS Take 1 tablet by mouth daily.   Yes Historical Provider, MD  carvedilol (COREG) 3.125 MG tablet TAKE 1 TABLET BY MOUTH 2 TIMES DAILY. 07/11/16  Yes Deboraha Sprang, MD  dabigatran (PRADAXA) 150 MG CAPS capsule Take 1 capsule (150 mg total) by mouth 2 (two) times daily. 08/16/16  Yes Deboraha Sprang, MD  famotidine (PEPCID) 20 MG tablet Take 1 tablet (20 mg total) by mouth 2 (two) times daily. 09/04/15  Yes Tresa Garter, MD  ferrous sulfate 325 (65 FE) MG tablet Take 1 tablet (325 mg total) by mouth 3 (three)  times daily with meals. 07/31/13  Yes Liliane Shi, PA-C  fish oil-omega-3 fatty acids 1000 MG capsule Take 1 g by mouth daily.   Yes Historical Provider, MD  furosemide (LASIX) 20 MG tablet Take 1 tablet (20 mg total) by mouth daily. 09/04/15  Yes Tresa Garter, MD  gabapentin (NEURONTIN) 300 MG capsule Take 1 capsule (300 mg total) by mouth 3 (three) times daily. 01/14/16  Yes Tresa Garter, MD  GAVILAX powder MIX 1 CAPFUL WITH LIQUID AND DRINK ONCE DAILY 02/05/16  Yes Lance Bosch, NP    glucose blood (ACCU-CHEK AVIVA PLUS) test strip Use as instructed 01/04/16  Yes Arlisha Patalano E Doreene Burke, MD  loratadine (CLARITIN) 10 MG tablet Take 1 tablet (10 mg total) by mouth daily. 07/12/16  Yes Shawn C Joy, PA-C  metFORMIN (GLUCOPHAGE) 500 MG tablet Take 1 tablet (500 mg total) by mouth 2 (two) times daily with a meal. 09/04/15  Yes Tresa Garter, MD  Multiple Vitamin (MULTIVITAMIN WITH MINERALS) TABS tablet Take 1 tablet by mouth daily.   Yes Historical Provider, MD  HYDROcodone-acetaminophen (HYCET) 7.5-325 mg/15 ml solution Take 10 mLs by mouth 4 (four) times daily as needed for moderate pain. Patient not taking: Reported on 08/18/2016 07/15/16   Konrad Felix, PA  hydrOXYzine (ATARAX/VISTARIL) 25 MG tablet Take 1 tablet (25 mg total) by mouth 3 (three) times daily as needed for anxiety. 08/18/16   Tresa Garter, MD  meloxicam (MOBIC) 15 MG tablet TAKE 1 TABLET BY MOUTH DAILY Patient not taking: Reported on 08/18/2016 05/10/16   Gardiner Barefoot, DPM  terbinafine (LAMISIL) 250 MG tablet TAKE 1 TABLET BY MOUTH DAILY. Patient not taking: Reported on 08/18/2016 04/06/16   Tresa Garter, MD   Objective:   Vitals:   08/18/16 1009  BP: (!) 144/84  Pulse: 81  Resp: 16  Temp: 97.4 F (36.3 C)  TempSrc: Oral  SpO2: 99%  Weight: 200 lb (90.7 kg)   Exam General appearance : Awake, alert, not in any distress. Speech Clear. Not toxic looking, poor dentition HEENT: Atraumatic and Normocephalic, pupils equally reactive to light and accomodation Neck: Supple, no JVD. No cervical lymphadenopathy.  Chest: Good air entry bilaterally, no added sounds  CVS: S1 S2 regular, no murmurs.  Abdomen: Bowel sounds present, Non tender and not distended with no gaurding, rigidity or rebound. Extremities: B/L Lower Ext shows no edema, both legs are warm to touch Neurology: Awake alert, and oriented X 3, CN II-XII intact, Non focal Skin: No Rash  Data Review Lab Results  Component Value Date    HGBA1C 6.6 01/14/2016   HGBA1C 6.50 06/29/2015   HGBA1C 6.80 03/16/2015   Assessment & Plan   1. Encounter for immunization  - Glucose (CBG) - HgB A1c - Flu Vaccine QUAD 36+ mos IM - CBC with Differential/Platelet - COMPLETE METABOLIC PANEL WITH GFR - Lipid panel - TSH - Urinalysis, Complete - VITAMIN D 25 Hydroxy (Vit-D Deficiency, Fractures)  2. Type 2 diabetes mellitus with complication, without long-term current use of insulin (HCC)  - Glucose (CBG) - HgB A1c - Urinalysis, Complete - VITAMIN D 25 Hydroxy (Vit-D Deficiency, Fractures)  3. Essential hypertension  - CBC with Differential/Platelet - COMPLETE METABOLIC PANEL WITH GFR - Lipid panel - TSH  We have discussed target BP range and blood pressure goal. I have advised patient to check BP regularly and to call us back or report to clinic if the numbers are consistently higher than 140/90. We  discussed the importance of compliance with medical therapy and DASH diet recommended, consequences of uncontrolled hypertension discussed.  - continue current BP medications  4. Depression with anxiety  - hydrOXYzine (ATARAX/VISTARIL) 25 MG tablet; Take 1 tablet (25 mg total) by mouth 3 (three) times daily as needed for anxiety.  Dispense: 60 tablet; Refill: 2  5. Diabetic polyneuropathy associated with type 2 diabetes mellitus (HCC)  - amitriptyline (ELAVIL) 50 MG tablet; Take 1 tablet (50 mg total) by mouth at bedtime.  Dispense: 30 tablet; Refill: 3  6. Colon cancer screening  - Ambulatory referral to Gastroenterology  Patient have been counseled extensively about nutrition and exercise  Return in about 3 months (around 11/17/2016) for Annual Physical, Pap Smear, Hemoglobin A1C and Follow up, DM.  The patient was given clear instructions to go to ER or return to medical center if symptoms don't improve, worsen or new problems develop. The patient verbalized understanding. The patient was told to call to get lab  results if they haven't heard anything in the next week.   This note has been created with Surveyor, quantity. Any transcriptional errors are unintentional.    Angelica Chessman, MD, [place], Karilyn Cota, [place] [facility] [place], [place]   08/18/2016, 11:07 AM

## 2016-08-18 NOTE — Patient Instructions (Signed)

## 2016-08-18 NOTE — Progress Notes (Signed)
Pt is in the office today for hypertension and diabetes Pt states she is not in any pain Pt states she is taking medication without difficulties

## 2016-08-19 LAB — URINALYSIS, COMPLETE
BILIRUBIN URINE: NEGATIVE
Bacteria, UA: NONE SEEN [HPF]
CRYSTALS: NONE SEEN [HPF]
Casts: NONE SEEN [LPF]
Hgb urine dipstick: NEGATIVE
KETONES UR: NEGATIVE
Leukocytes, UA: NEGATIVE
NITRITE: NEGATIVE
PH: 5 (ref 5.0–8.0)
Protein, ur: NEGATIVE
RBC / HPF: NONE SEEN RBC/HPF (ref <=2)
SPECIFIC GRAVITY, URINE: 1.012 (ref 1.001–1.035)
Squamous Epithelial / LPF: NONE SEEN [HPF] (ref <=5)
WBC UA: NONE SEEN WBC/HPF (ref <=5)
Yeast: NONE SEEN [HPF]

## 2016-08-19 LAB — VITAMIN D 25 HYDROXY (VIT D DEFICIENCY, FRACTURES): VIT D 25 HYDROXY: 35 ng/mL (ref 30–100)

## 2016-08-24 ENCOUNTER — Other Ambulatory Visit: Payer: Self-pay | Admitting: Podiatry

## 2016-08-24 MED FILL — MELOXICAM 15 MG TABLET: 15 | 30 days supply | Qty: 30 | Fill #0

## 2016-08-24 NOTE — Telephone Encounter (Signed)
Pt needs an appt prior to future refills. 

## 2016-08-26 ENCOUNTER — Telehealth: Payer: Self-pay | Admitting: *Deleted

## 2016-08-26 NOTE — Telephone Encounter (Signed)
Patient verified DOB Patient is aware of cholesterol triglycerides being elevated. Patient is advised to limit saturated fats and cholesterol intake. Patient aslo advised to increase fiber and exercise as tolerated. Patient is scheduled for a fasting lab for lipid recheck on Friday 09/02/16 at 9:00am. Patient expressed her understanding and had no further questions at this time.

## 2016-08-26 NOTE — Telephone Encounter (Signed)
-----   Message from Quentin Angstlugbemiga E Jegede, MD sent at 08/25/2016  9:31 AM EDT ----- Please inform patient that her lab results are mostly normal except for triglyceride, a form of cholesterol that is high. Advise patient to limit saturated fat to no more than 7% of her calories, limit cholesterol to 200 mg/day, increase fiber and exercise as tolerated. We will repeat the lipid panel with a fasting sample and if the value remains high, will start a cholesterol medication. Please schedule patient for fasting lipid panel, lab visit only.

## 2016-09-02 ENCOUNTER — Ambulatory Visit: Payer: Medicaid Other | Attending: Internal Medicine

## 2016-09-02 DIAGNOSIS — E785 Hyperlipidemia, unspecified: Secondary | ICD-10-CM | POA: Insufficient documentation

## 2016-09-02 LAB — LIPID PANEL
CHOL/HDL RATIO: 3.1 ratio (ref <=5.0)
CHOLESTEROL: 144 mg/dL (ref 125–200)
HDL: 47 mg/dL (ref >=46)
TRIGLYCERIDES: 405 mg/dL — AB (ref <150)

## 2016-09-02 NOTE — Progress Notes (Signed)
Patient here for lab visit only 

## 2016-09-04 ENCOUNTER — Other Ambulatory Visit: Payer: Self-pay | Admitting: Internal Medicine

## 2016-09-04 MED ORDER — FENOFIBRATE 145 MG PO TABS: 145.0000 mg | ORAL_TABLET | Freq: Every day | ORAL | 3 refills | Status: DC

## 2016-09-05 MED FILL — TRICOR 145 MG TABLET: 145 | 30 days supply | Qty: 30 | Fill #0

## 2016-09-06 ENCOUNTER — Telehealth: Payer: Self-pay | Admitting: *Deleted

## 2016-09-06 ENCOUNTER — Telehealth: Payer: Self-pay | Admitting: Internal Medicine

## 2016-09-06 NOTE — Telephone Encounter (Signed)
Patient needs a fasting lab to compare to lab result from recent visit. Patient needs to be scheduled for a fasting lab. Not eating after midnight so a early morning appointment.

## 2016-09-06 NOTE — Telephone Encounter (Signed)
Called pt. To schedule a Lab appointment and pt. Stated that she had already done her lab appointment.

## 2016-09-06 NOTE — Telephone Encounter (Signed)
Patient verified DOB Patient is aware of labs being mostly normal except for Triglycerides being high. Patient is advised to limit saturated fat intake and increase cholesterol and fiber intake. Patient is aware [facility]. Patient expressed her understanding and had no further questions at this time.

## 2016-09-06 NOTE — Telephone Encounter (Signed)
-----   Message from Quentin Angstlugbemiga E Jegede, MD sent at 09/04/2016  3:13 PM EDT ----- Triglyceride (a form of cholesterol) is very high. She needs to start medication and also please limit saturated fat to no more than 7% of your calories, limit cholesterol to 200 mg/day, increase fiber and exercise as tolerated. Fenofibrate prescribed to the pharmacy

## 2016-09-07 ENCOUNTER — Other Ambulatory Visit: Payer: Self-pay | Admitting: Internal Medicine

## 2016-09-07 DIAGNOSIS — I10 Essential (primary) hypertension: Secondary | ICD-10-CM

## 2016-09-07 MED FILL — FUROSEMIDE 20 MG TABLET: 20 | 30 days supply | Qty: 30 | Fill #0

## 2016-09-07 MED FILL — AMLODIPINE BESYLATE 10 MG T: 10 | 30 days supply | Qty: 30 | Fill #5

## 2016-09-12 ENCOUNTER — Ambulatory Visit: Payer: Medicaid Other | Attending: Internal Medicine

## 2016-09-12 DIAGNOSIS — E785 Hyperlipidemia, unspecified: Secondary | ICD-10-CM | POA: Diagnosis present

## 2016-09-12 LAB — LIPID PANEL
CHOLESTEROL: 126 mg/dL (ref 125–200)
HDL: 43 mg/dL — ABNORMAL LOW (ref >=46)
TRIGLYCERIDES: 463 mg/dL — AB (ref <150)
Total CHOL/HDL Ratio: 2.9 Ratio (ref <=5.0)

## 2016-09-12 NOTE — Progress Notes (Signed)
PATIENT HERE FOR LAB VISIT ONLY

## 2016-09-15 ENCOUNTER — Telehealth: Payer: Self-pay | Admitting: Internal Medicine

## 2016-09-15 NOTE — Telephone Encounter (Signed)
Dr Hyman HopesJegede refer Jane Arellano to gi and I still waiting for Dr Hyman HopesJegede notes so, I can process the referral thank you .

## 2016-09-16 ENCOUNTER — Telehealth: Payer: Self-pay | Admitting: *Deleted

## 2016-09-16 NOTE — Telephone Encounter (Signed)
Patient is aware via VM to begin taking the fenofibrate which [facility]. Patient is aware of fasting lipid showing triglycerides being high. Patient is advised to also limit her saturated fats, increase fiber and exercise and FU as scheduled. [facility] contact information if she had no further concerns.

## 2016-09-16 NOTE — Telephone Encounter (Signed)
Patient confirmed she understood the information left on her VM and had no further questions at this time.

## 2016-09-16 NOTE — Telephone Encounter (Signed)
-----   Message from Quentin Angstlugbemiga E Jegede, MD sent at 09/14/2016  4:49 PM EDT ----- Please inform patient that her triglyceride (a form of cholesterol) remains high on repeat testing, please start taking the Fenofibrate prescribed to the pharmacy and follow up as earlier scheduled.

## 2016-09-16 NOTE — Telephone Encounter (Signed)
Jane Arellano is needing a completed note to send with referrals.

## 2016-09-19 ENCOUNTER — Other Ambulatory Visit: Payer: Self-pay | Admitting: Internal Medicine

## 2016-09-19 DIAGNOSIS — I10 Essential (primary) hypertension: Secondary | ICD-10-CM

## 2016-09-19 MED FILL — GABAPENTIN 300 MG CAPSULE: 300 | 30 days supply | Qty: 90 | Fill #4

## 2016-09-19 MED FILL — TERBINAFINE HCL 250 MG TAB: 250 | 30 days supply | Qty: 30 | Fill #4

## 2016-09-22 MED FILL — metFORMIN HCL 500 MG TABS: 500 | 30 days supply | Qty: 60 | Fill #0

## 2016-09-23 ENCOUNTER — Other Ambulatory Visit: Payer: Self-pay | Admitting: Internal Medicine

## 2016-09-23 ENCOUNTER — Other Ambulatory Visit: Payer: Self-pay | Admitting: Podiatry

## 2016-09-23 DIAGNOSIS — K219 Gastro-esophageal reflux disease without esophagitis: Secondary | ICD-10-CM

## 2016-09-23 MED FILL — FAMOTIDINE 20 MG TABLET: 20 | 30 days supply | Qty: 60 | Fill #0

## 2016-09-25 IMAGING — DX DG CHEST 2V
2 series · 2 of 2 positions shown · non-contrast
Comparison: 04/23/2014

CLINICAL DATA: Atrial fibrillation

EXAM:
CHEST - 2 VIEW

[chest pa]
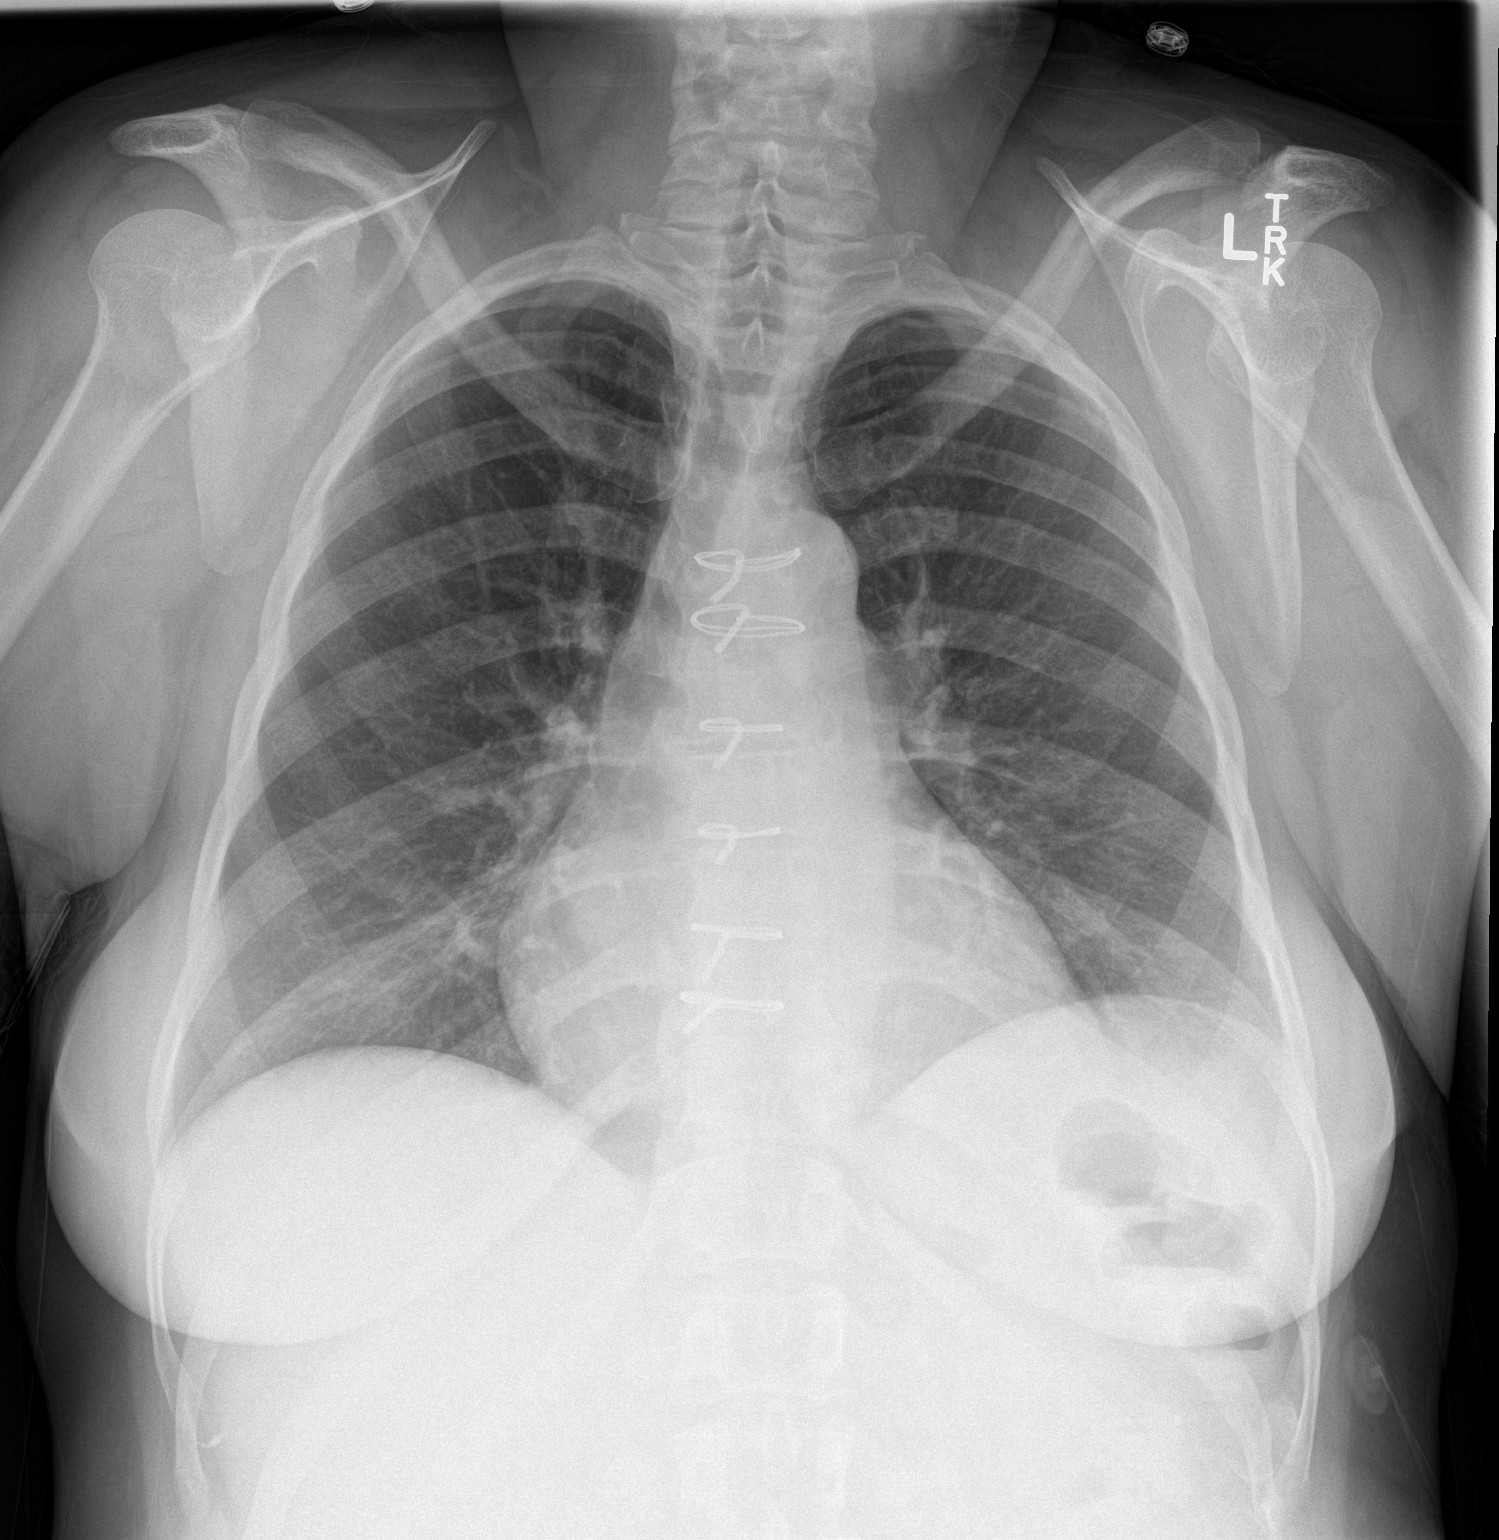

[chest lat]
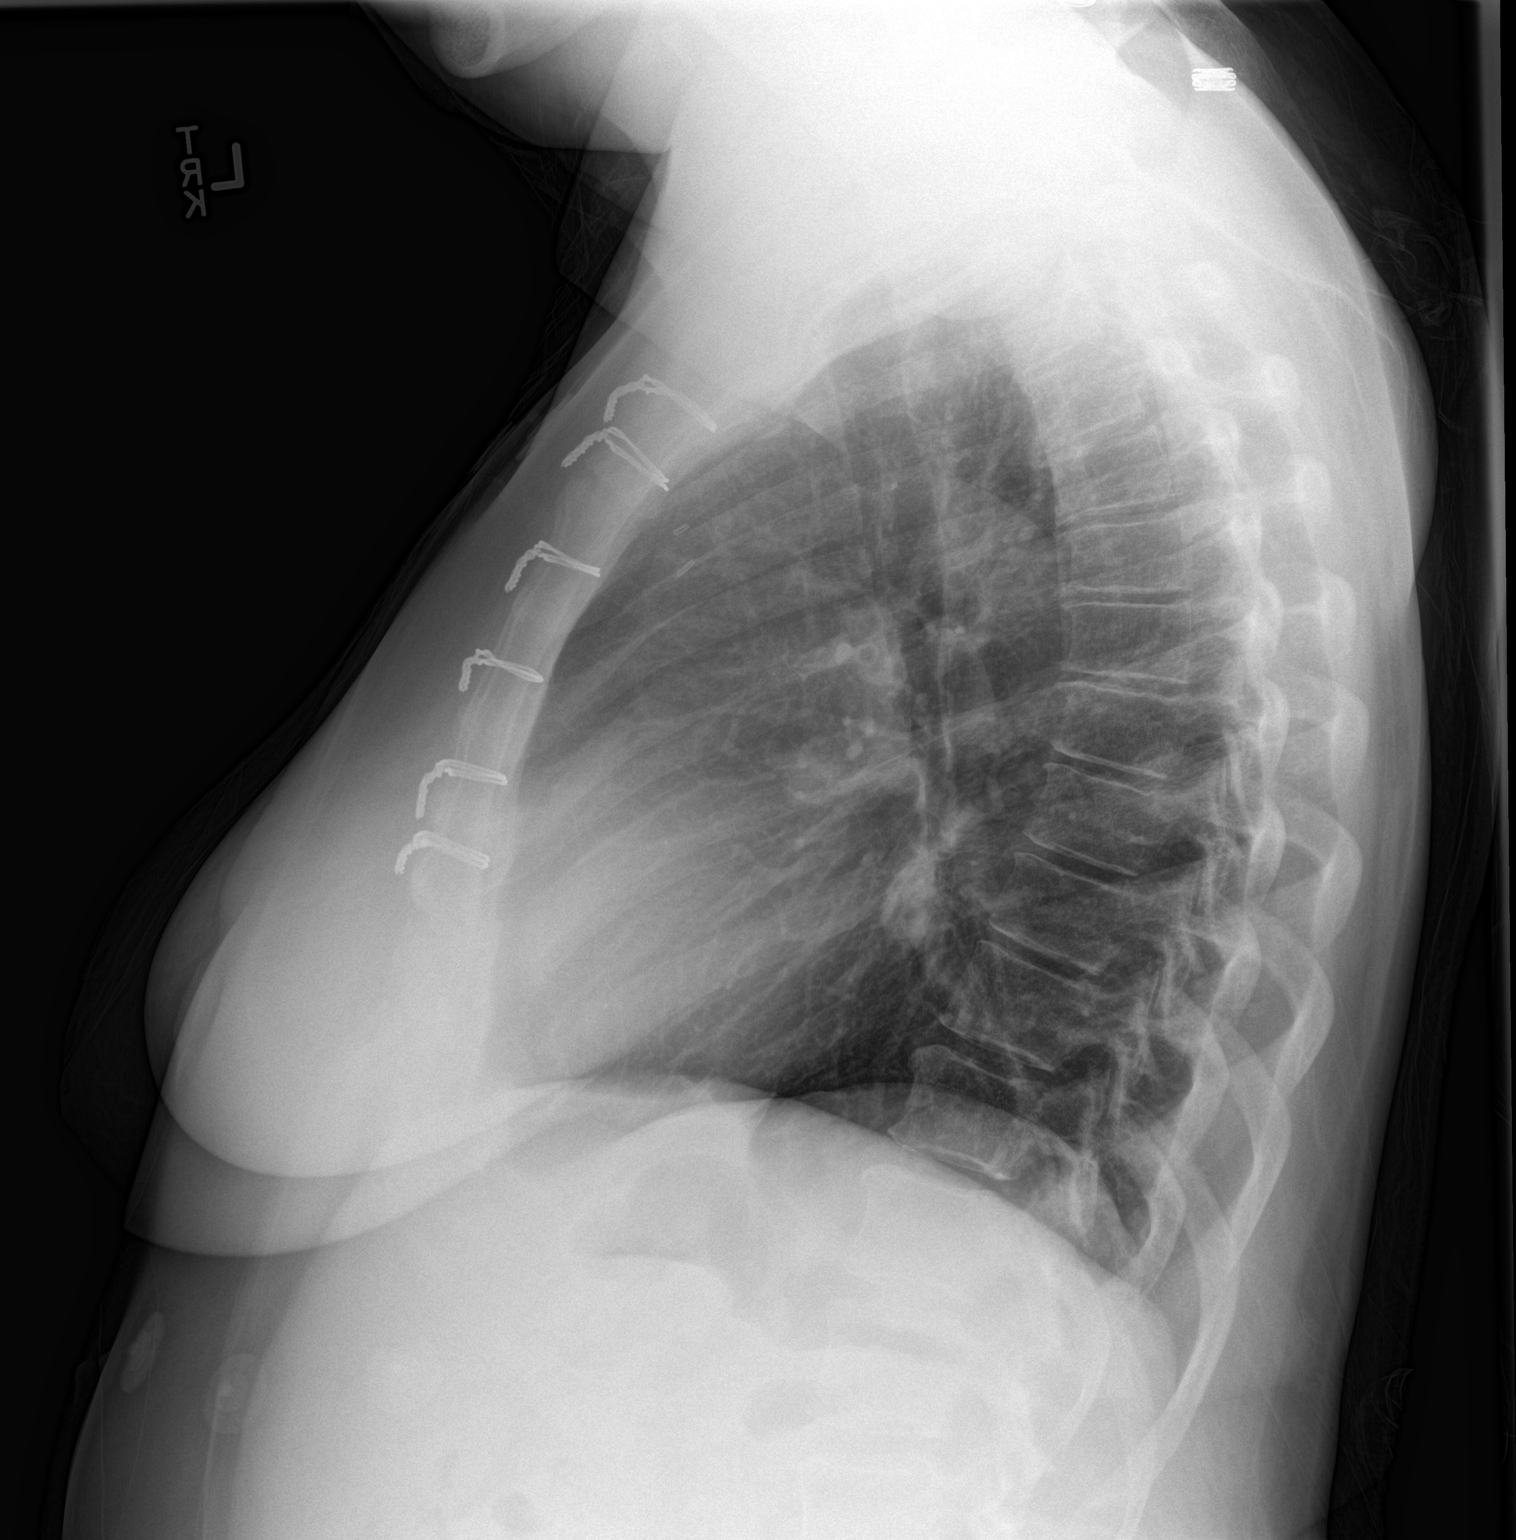

[2 of 2 positions shown; findings below may reference images not displayed]

FINDINGS: Cardiac shadow is stable. Postoperative changes are again seen. The
lungs are clear bilaterally. No acute bony abnormality is seen.
IMPRESSION: No active disease.

## 2016-09-27 ENCOUNTER — Other Ambulatory Visit: Payer: Self-pay | Admitting: Podiatry

## 2016-09-27 ENCOUNTER — Other Ambulatory Visit: Payer: Self-pay | Admitting: Internal Medicine

## 2016-09-29 IMAGING — MR MR HEAD W/O CM
8 of 9 series · 34 of 48 positions shown · non-contrast
Comparison: None.

CLINICAL DATA: Headache and left arm numbness. Partial study due to
severe claustrophobia.

EXAM:
MRI HEAD WITHOUT CONTRAST
TECHNIQUE: Multiplanar, multiecho pulse sequences of the brain and surrounding
structures were obtained without intravenous contrast.

[Series 3: DWI · axial · 3.0mm · 0.94mm/px · z∈[-92,+55]mm · 8 of 100 slices shown (1 of 4)]
[im 1/100]
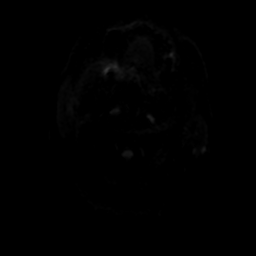
[im 12/100]
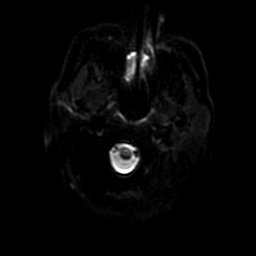
[im 34/100]
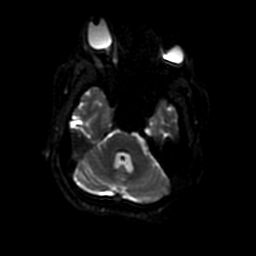
[im 45/100]
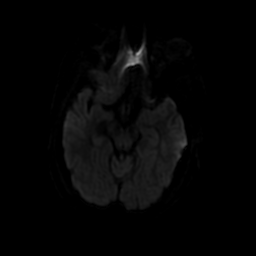
[im 56/100]
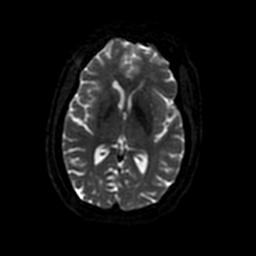
[im 67/100]
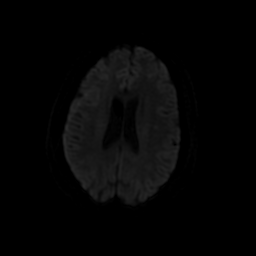
[im 89/100]
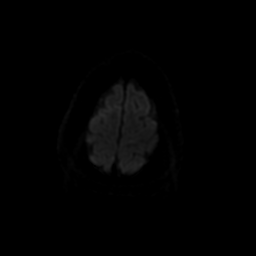
[im 100/100]
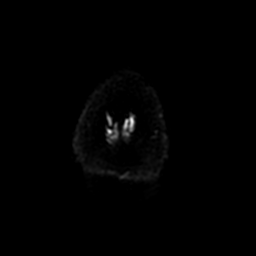

[Series 4: FLAIR · sagittal · 5.0mm · 0.47mm/px · 3 of 23 slices shown (1 of 2)]
[im 1/23]
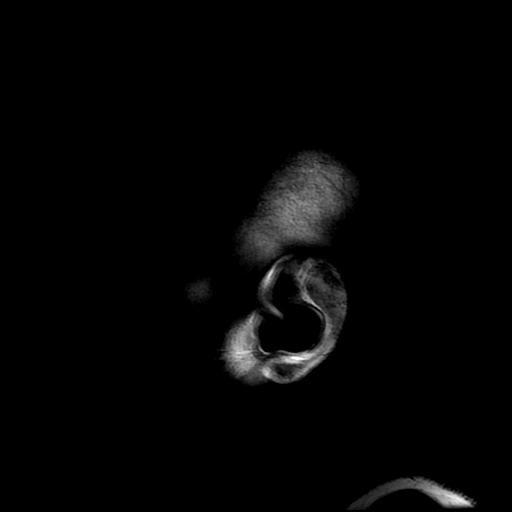
[im 12/23]
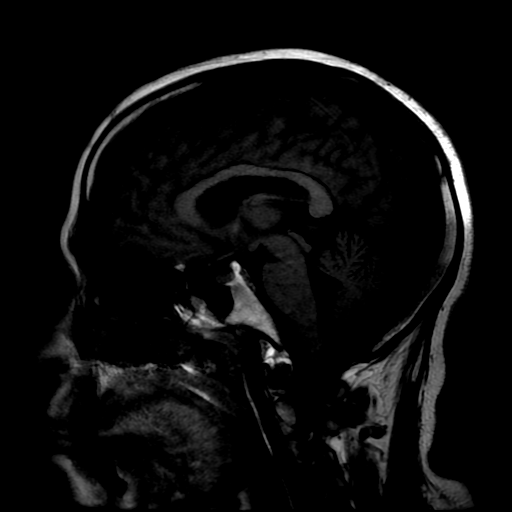
[im 23/23]
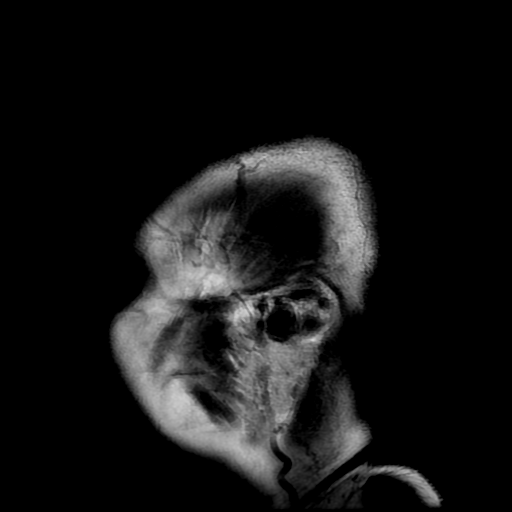

[Series 5: DWI · coronal · 5.0mm · 0.94mm/px · 6 of 72 slices shown (2 of 4)]
[im 1/72]
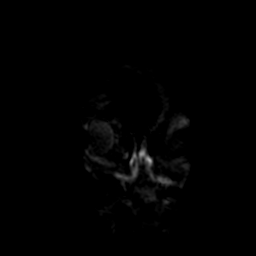
[im 15/72]
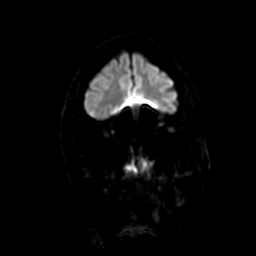
[im 29/72]
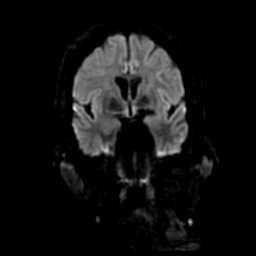
[im 43/72]
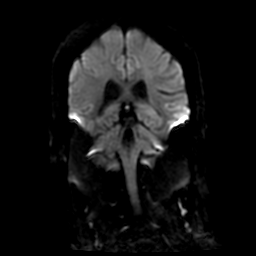
[im 57/72]
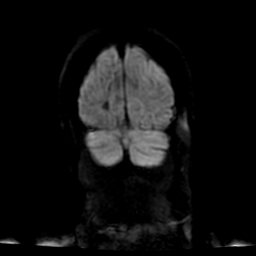
[im 72/72]
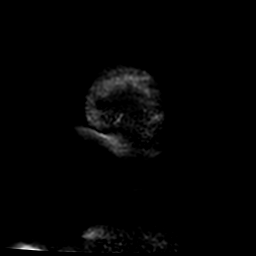

[Series 6: (person_name) · axial · 3.0mm · 0.47mm/px · z∈[-82,+29]mm · 6 of 100 slices shown]
[im 1/100]
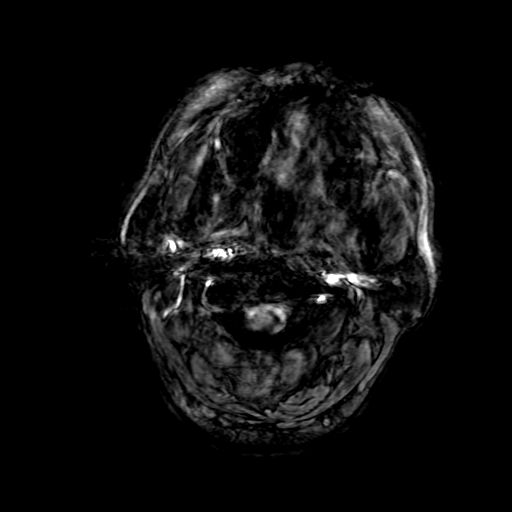
[im 13/100]
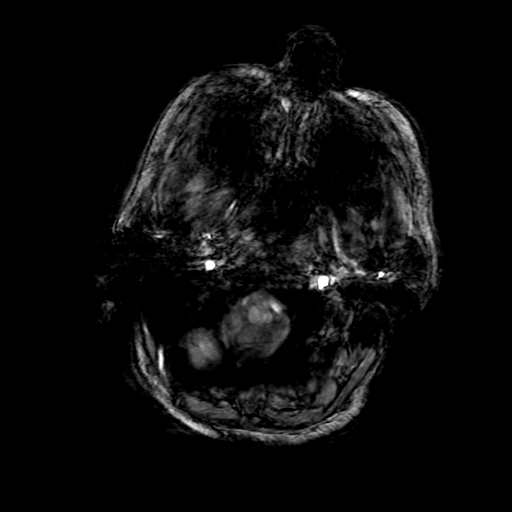
[im 25/100]
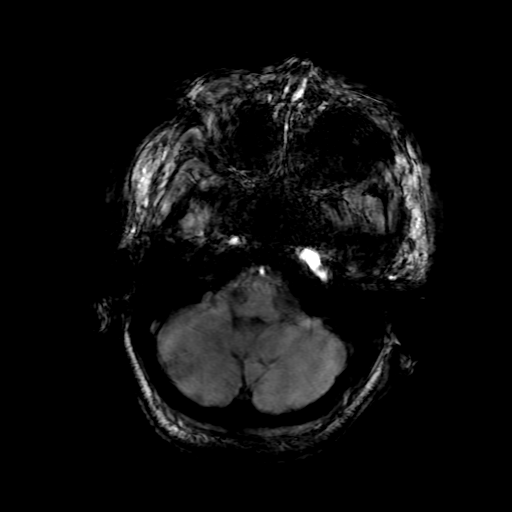
[im 38/100]
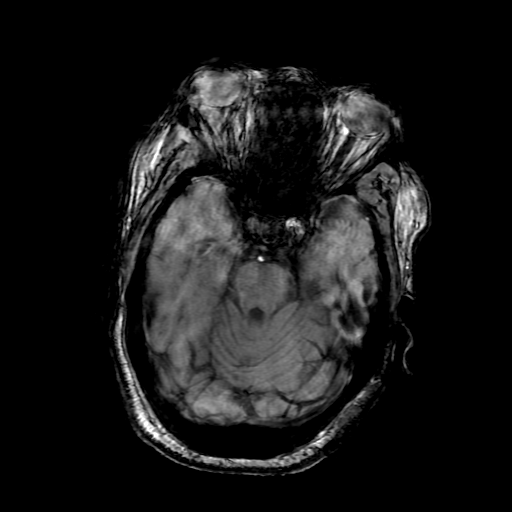
[im 62/100]
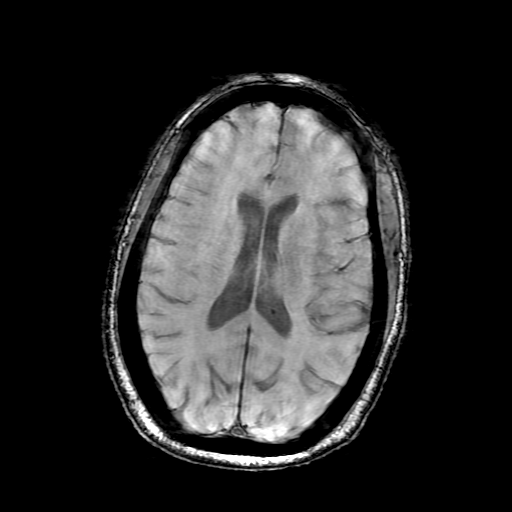
[im 75/100]
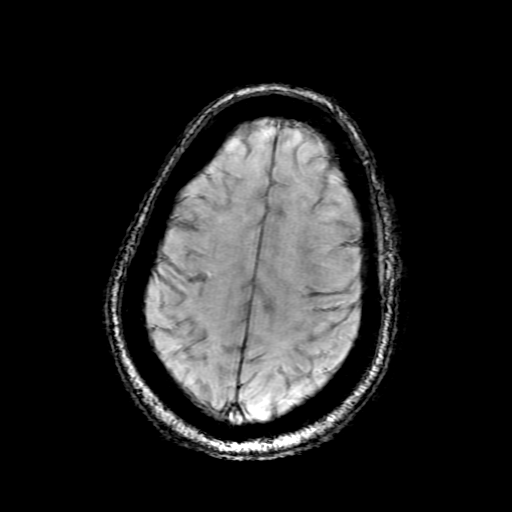

[Series 7: FLAIR · axial · 5.0mm · 0.47mm/px · z∈[-92,+64]mm · 2 of 27 slices shown (2 of 2)]
[im 1/27]
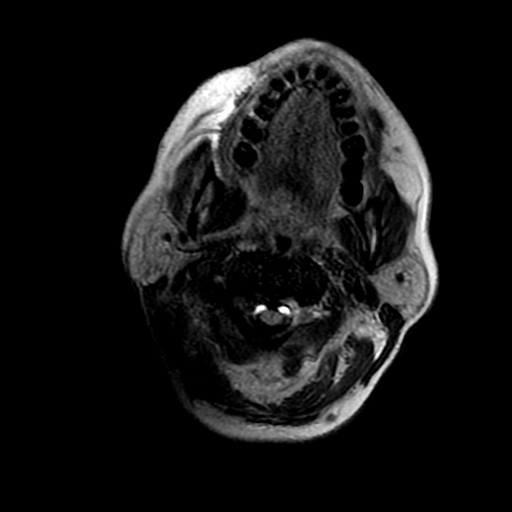
[im 27/27]
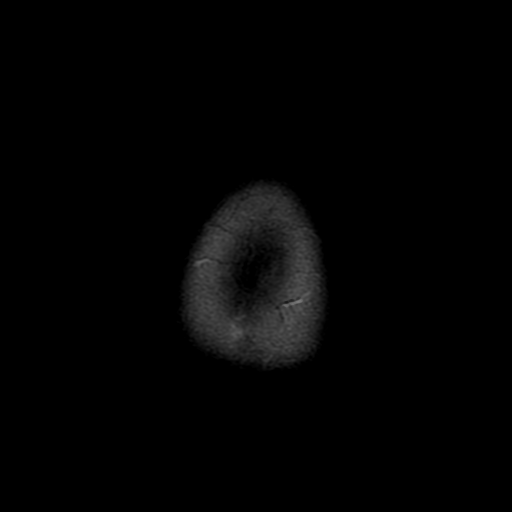

[Series 8: T2 · axial · 5.0mm · 0.47mm/px · z∈[-82,+74]mm · 2 of 27 slices shown]
[im 1/27]
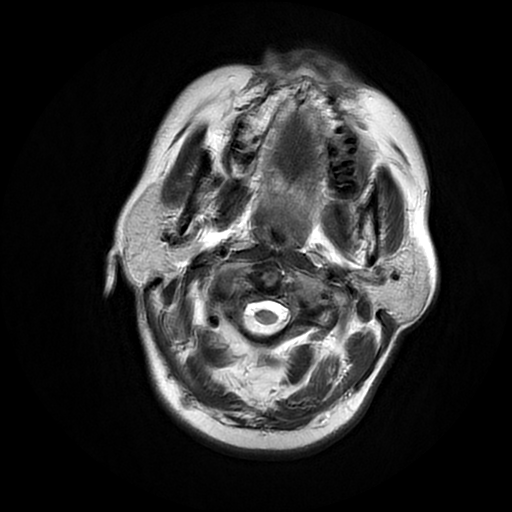
[im 27/27]
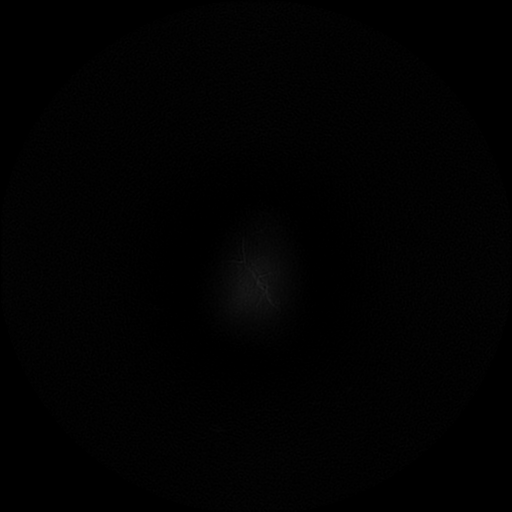

[Series 300: DWI · axial · 3.0mm · 0.94mm/px · z∈[-92,+55]mm · 4 of 50 slices shown (3 of 4)]
[im 1/50]
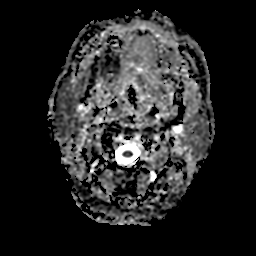
[im 17/50]
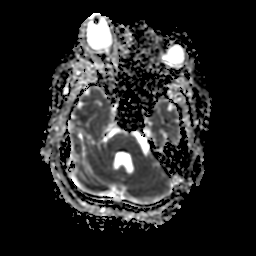
[im 33/50]
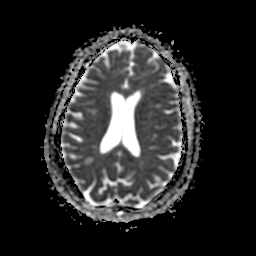
[im 50/50]
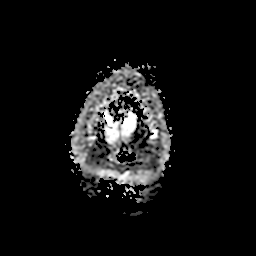

[Series 500: DWI · coronal · 5.0mm · 0.94mm/px · 3 of 35 slices shown (4 of 4)]
[im 1/35]
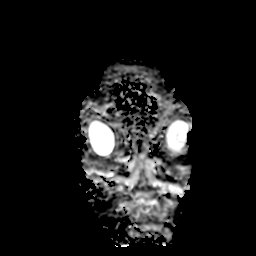
[im 18/35]
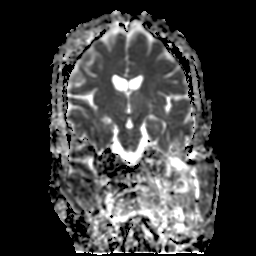
[im 35/35]
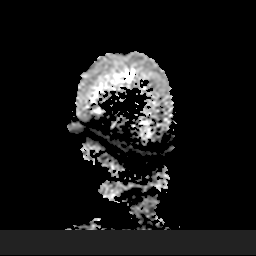

[34 of 48 positions shown; findings below may reference images not displayed]

FINDINGS: Calvarium and upper cervical spine: No focal marrow signal
abnormality.

Orbits: No significant findings.

Sinuses and Mastoids: Essentially clear.

Brain: Abnormal number of T2 and FLAIR hyperintense foci in the
bilateral cerebral white matter, nonspecific in distribution. There
is a prominent T2 hyperintensity in the subcortical right parietal
white matter, less than 1 cm and without restricted diffusion,
presumably ischemic sequelae given the diffuse white matter disease.
Although the white matter changes are nonspecific (and could be
related to remote ischemic injury, demyelination, or vasculopathy),
history of diabetes, cocaine abuse and hypertension strongly favors
chronic small vessel ischemia. No acute infarct, hemorrhage,
hydrocephalus, or definite mass lesion.
IMPRESSION: 1. No acute finding, including infarct.
2. Moderate for age white matter disease, nonspecific but likely
chronic small vessel ischemia given risk factors.

## 2016-09-30 MED FILL — FUROSEMIDE 20 MG TABLET: 20 | 30 days supply | Qty: 30 | Fill #0

## 2016-09-30 MED FILL — AMLODIPINE BESYLATE 10 MG T: 10 | 30 days supply | Qty: 30 | Fill #0

## 2016-10-04 MED FILL — FENOFIBRATE 145 MG TABLET: 145 | 30 days supply | Qty: 30 | Fill #1

## 2016-10-04 MED FILL — CARVEDILOL 3.125 MG TABLET: 3.125 | 30 days supply | Qty: 60 | Fill #2

## 2016-10-06 MED FILL — POLYETHYLENE GLYCOL 3350: 30 days supply | Qty: 510 | Fill #1

## 2016-10-21 MED FILL — TERBINAFINE HCL 250 MG TAB: 250 | 30 days supply | Qty: 30 | Fill #5

## 2016-11-01 MED FILL — metFORMIN HCL 500 MG TABS: 500 | 30 days supply | Qty: 60 | Fill #1

## 2016-11-01 MED FILL — FENOFIBRATE 145 MG TABLET: 145 | 30 days supply | Qty: 30 | Fill #2

## 2016-11-01 MED FILL — FUROSEMIDE 20 MG TABLET: 20 | 30 days supply | Qty: 30 | Fill #1

## 2016-11-01 MED FILL — AMLODIPINE BESYLATE 10 MG T: 10 | 30 days supply | Qty: 30 | Fill #1

## 2016-11-04 MED FILL — FAMOTIDINE 20 MG TABLET: 20 | 30 days supply | Qty: 60 | Fill #1

## 2016-11-16 MED FILL — CARVEDILOL 3.125 MG TABLET: 3.125 | 30 days supply | Qty: 60 | Fill #3

## 2016-11-17 ENCOUNTER — Ambulatory Visit: Payer: Medicaid Other | Admitting: Internal Medicine

## 2016-12-05 MED FILL — AMLODIPINE BESYLATE 10 MG T: 10 | 30 days supply | Qty: 30 | Fill #2

## 2016-12-05 MED FILL — GABAPENTIN 300 MG CAPSULE: 300 | 30 days supply | Qty: 90 | Fill #5

## 2016-12-05 MED FILL — FUROSEMIDE 20 MG TABLET: 20 | 30 days supply | Qty: 30 | Fill #2

## 2016-12-05 MED FILL — FENOFIBRATE 145 MG TABLET: 145 | 30 days supply | Qty: 30 | Fill #3

## 2016-12-09 ENCOUNTER — Other Ambulatory Visit: Payer: Self-pay | Admitting: Internal Medicine

## 2016-12-09 MED FILL — TERBINAFINE HCL 250 MG TAB: 250 | 30 days supply | Qty: 30 | Fill #0

## 2016-12-15 MED FILL — metFORMIN HCL 500 MG TABS: 500 | 30 days supply | Qty: 60 | Fill #2

## 2016-12-20 MED FILL — PRADAXA 150 MG CAPSULE: 150 | 30 days supply | Qty: 60 | Fill #0

## 2016-12-20 MED FILL — CARVEDILOL 3.125 MG TABLET: 3.125 | 30 days supply | Qty: 60 | Fill #4

## 2016-12-20 MED FILL — FAMOTIDINE 20 MG TABLET: 20 | 30 days supply | Qty: 60 | Fill #2

## 2016-12-21 ENCOUNTER — Telehealth: Payer: Self-pay

## 2016-12-21 ENCOUNTER — Ambulatory Visit: Payer: Medicaid Other | Admitting: Internal Medicine

## 2016-12-21 NOTE — Telephone Encounter (Signed)
Patient now has Medicaid, so she is not eligible for Patient Assistance for Pradaxa. Prior auth not needed at [facility]. They will only allow a 30 day fill at a time.

## 2016-12-27 ENCOUNTER — Telehealth: Payer: Self-pay | Admitting: Internal Medicine

## 2016-12-27 NOTE — Telephone Encounter (Signed)
NEW MESSAGE    THEY NEED PRE AUTHORIZATION FOR THE PRADAXA , request was sent last week and it has not been done yet

## 2016-12-30 NOTE — Telephone Encounter (Signed)
Called the contact # left by Jillyn HiddenGary with Cover My Meds.  The person I spoke with stated we would need to call [facility] directly for PA at [phone].  Called [facility] [place] [facility]- Per rep there, the pharmacy is billing for a #90 day supply and this will just need to be decreased to a #30 day supply and they should get a paid claim.  I am unsure of which pharmacy this patient's RX for Pradaxa is coming from - it looks like [facility]- I tried to call and confirm this, but they are currently closed.  Will forward to the refill team to confirm if this is the correct pharmacy as I am out on Monday- if not please find correct pharmacy please and just let them know that only a # 30-day supply will be approved.

## 2017-01-02 NOTE — Telephone Encounter (Signed)
pt resolved situation with [facility] herself.

## 2017-01-05 ENCOUNTER — Other Ambulatory Visit: Payer: Self-pay | Admitting: Internal Medicine

## 2017-01-05 DIAGNOSIS — I10 Essential (primary) hypertension: Secondary | ICD-10-CM

## 2017-01-05 MED FILL — AMLODIPINE BESYLATE 10 MG T: 10 | 30 days supply | Qty: 30 | Fill #3

## 2017-01-06 MED FILL — FENOFIBRATE 145 MG TABLET: 145 | 30 days supply | Qty: 30 | Fill #0

## 2017-01-06 MED FILL — FUROSEMIDE 20 MG TABLET: 20 | 30 days supply | Qty: 30 | Fill #0

## 2017-01-16 MED FILL — PRADAXA 150 MG CAP: 150 | 30 days supply | Qty: 60 | Fill #1

## 2017-01-16 MED FILL — TERBINAFINE HCL 250 MG TAB: 250 | 30 days supply | Qty: 30 | Fill #1

## 2017-01-26 ENCOUNTER — Other Ambulatory Visit: Payer: Self-pay | Admitting: Internal Medicine

## 2017-01-26 DIAGNOSIS — K219 Gastro-esophageal reflux disease without esophagitis: Secondary | ICD-10-CM

## 2017-01-26 DIAGNOSIS — E1142 Type 2 diabetes mellitus with diabetic polyneuropathy: Secondary | ICD-10-CM

## 2017-01-26 DIAGNOSIS — E118 Type 2 diabetes mellitus with unspecified complications: Secondary | ICD-10-CM

## 2017-01-26 MED FILL — FAMOTIDINE 20 MG TABLET: 20 | 30 days supply | Qty: 60 | Fill #0

## 2017-01-26 MED FILL — GABAPENTIN 300 MG CAPSULE: 300 | 30 days supply | Qty: 90 | Fill #0

## 2017-02-01 ENCOUNTER — Other Ambulatory Visit: Payer: Self-pay | Admitting: Internal Medicine

## 2017-02-01 MED FILL — AMLODIPINE BESYLATE 10 MG T: 10 | 30 days supply | Qty: 30 | Fill #4

## 2017-02-01 MED FILL — FENOFIBRATE 145 MG TABLET: 145 | 30 days supply | Qty: 30 | Fill #0

## 2017-02-06 MED FILL — metFORMIN HCL 500 MG TABS: 500 | 30 days supply | Qty: 60 | Fill #0

## 2017-02-08 ENCOUNTER — Other Ambulatory Visit: Payer: Self-pay | Admitting: Internal Medicine

## 2017-02-08 DIAGNOSIS — I10 Essential (primary) hypertension: Secondary | ICD-10-CM

## 2017-02-08 MED FILL — FUROSEMIDE 20 MG TABLET: 20 | 30 days supply | Qty: 30 | Fill #0

## 2017-02-08 MED FILL — TERBINAFINE HCL 250 MG TAB: 250 | 30 days supply | Qty: 30 | Fill #2

## 2017-02-08 MED FILL — CARVEDILOL 3.125 MG TABLET: 3.125 | 30 days supply | Qty: 60 | Fill #5

## 2017-02-13 ENCOUNTER — Other Ambulatory Visit: Payer: Self-pay | Admitting: Internal Medicine

## 2017-02-13 DIAGNOSIS — E1142 Type 2 diabetes mellitus with diabetic polyneuropathy: Secondary | ICD-10-CM

## 2017-02-21 MED FILL — AMITRIPTYLINE HCL 50 MG TAB: 50 | 30 days supply | Qty: 30 | Fill #0

## 2017-03-03 ENCOUNTER — Other Ambulatory Visit: Payer: Self-pay | Admitting: Internal Medicine

## 2017-03-03 MED FILL — PRADAXA 150 MG CAP: 150 | 30 days supply | Qty: 60 | Fill #2

## 2017-03-03 MED FILL — AMLODIPINE BESYLATE 10 MG T: 10 | 30 days supply | Qty: 30 | Fill #5

## 2017-03-03 MED FILL — FENOFIBRATE 145 MG TABLET: 145 | 30 days supply | Qty: 30 | Fill #1

## 2017-03-06 MED FILL — ACCU-CHEK AVIVA PLUS TEST S: 30 days supply | Qty: 100 | Fill #0

## 2017-03-13 ENCOUNTER — Other Ambulatory Visit: Payer: Self-pay | Admitting: Internal Medicine

## 2017-03-13 DIAGNOSIS — K219 Gastro-esophageal reflux disease without esophagitis: Secondary | ICD-10-CM

## 2017-03-13 DIAGNOSIS — I10 Essential (primary) hypertension: Secondary | ICD-10-CM

## 2017-03-13 MED FILL — FUROSEMIDE 20 MG TABLET: 20 | 30 days supply | Qty: 30 | Fill #0

## 2017-03-13 MED FILL — TERBINAFINE HCL 250 MG TAB: 250 | 30 days supply | Qty: 30 | Fill #3

## 2017-03-13 MED FILL — FAMOTIDINE 20 MG TABLET: 20 | 30 days supply | Qty: 60 | Fill #0

## 2017-03-17 ENCOUNTER — Other Ambulatory Visit: Payer: Self-pay | Admitting: Internal Medicine

## 2017-03-22 MED FILL — metFORMIN HCL 500 MG TABS: 500 | 30 days supply | Qty: 60 | Fill #0

## 2017-03-22 MED FILL — CARVEDILOL 3.125 MG TABLET: 3.125 | 30 days supply | Qty: 60 | Fill #0

## 2017-03-23 ENCOUNTER — Other Ambulatory Visit: Payer: Self-pay | Admitting: Internal Medicine

## 2017-03-23 DIAGNOSIS — Z1231 Encounter for screening mammogram for malignant neoplasm of breast: Secondary | ICD-10-CM

## 2017-03-24 ENCOUNTER — Telehealth: Payer: Self-pay | Admitting: Internal Medicine

## 2017-03-24 NOTE — Telephone Encounter (Signed)
UNABLE TO LOCATE  CLEARANCE FORM  SPOKE WITH  COURTNEY  AT  [facility]   WILL RE FAX  FORM .  PT NEEDS  COLONOSCOPY  NEED  DIRECTIONS  ON HOLDING PRADAXA  AS WELL AS  IF  PT  MAY PROCEED WITH  COLONOSCOPY WILL FORWARD TO DR Graciela HusbandsKLEIN FOR  REVIEW .Zack Seal/CY

## 2017-03-24 NOTE — Telephone Encounter (Signed)
Patient is scheduled for colonoscopy ----request was faxed 03/10/17 regarding Pradaxa instructions and reply has not been received.

## 2017-03-28 NOTE — Telephone Encounter (Signed)
Has not been seen in 2 yrs   Does have appt with AFib clinic 5/18  Will defer to DC when she seesher but dont expect thaer will be an issue

## 2017-04-03 ENCOUNTER — Inpatient Hospital Stay (HOSPITAL_COMMUNITY): Admission: RE | Admit: 2017-04-03 | Payer: Self-pay | Source: Ambulatory Visit | Admitting: Nurse Practitioner

## 2017-04-03 NOTE — Telephone Encounter (Signed)
[facility] AWARE THAT   RECOMMENDATIONS  WILL BE  MADE  AT  THE  TIME  OF MAY VISIT .Zack Seal/CY

## 2017-04-10 ENCOUNTER — Other Ambulatory Visit: Payer: Self-pay | Admitting: Internal Medicine

## 2017-04-10 DIAGNOSIS — I10 Essential (primary) hypertension: Secondary | ICD-10-CM

## 2017-04-10 DIAGNOSIS — E1142 Type 2 diabetes mellitus with diabetic polyneuropathy: Secondary | ICD-10-CM

## 2017-04-10 DIAGNOSIS — E118 Type 2 diabetes mellitus with unspecified complications: Secondary | ICD-10-CM

## 2017-04-10 MED FILL — AMLODIPINE BESYLATE 10 MG T: 10 | 30 days supply | Qty: 30 | Fill #0

## 2017-04-10 MED FILL — FUROSEMIDE 20 MG TABLET: 20 | 30 days supply | Qty: 30 | Fill #0

## 2017-04-10 MED FILL — FENOFIBRATE 145 MG TABLET: 145 | 30 days supply | Qty: 30 | Fill #2

## 2017-04-10 MED FILL — PRADAXA 150 MG CAP: 150 | 30 days supply | Qty: 60 | Fill #3

## 2017-04-10 MED FILL — TERBINAFINE HCL 250 MG TAB: 250 | 30 days supply | Qty: 30 | Fill #4

## 2017-04-10 MED FILL — GABAPENTIN 300 MG CAPSULE: 300 | 30 days supply | Qty: 90 | Fill #0

## 2017-04-11 ENCOUNTER — Encounter (HOSPITAL_COMMUNITY): Payer: Self-pay | Admitting: Nurse Practitioner

## 2017-04-11 ENCOUNTER — Ambulatory Visit (HOSPITAL_COMMUNITY)
Admission: RE | Admit: 2017-04-11 | Discharge: 2017-04-11 | Disposition: A | Discharge status: Home / Self Care | Payer: Medicaid Other | Source: Ambulatory Visit | Attending: Nurse Practitioner | Admitting: Nurse Practitioner

## 2017-04-11 VITALS — BP 148/86 | HR 69 | Ht 67.0 in | Wt 191.2 lb

## 2017-04-11 DIAGNOSIS — E1142 Type 2 diabetes mellitus with diabetic polyneuropathy: Secondary | ICD-10-CM | POA: Insufficient documentation

## 2017-04-11 DIAGNOSIS — I480 Paroxysmal atrial fibrillation: Secondary | ICD-10-CM | POA: Insufficient documentation

## 2017-04-11 DIAGNOSIS — Z7901 Long term (current) use of anticoagulants: Secondary | ICD-10-CM | POA: Diagnosis not present

## 2017-04-11 DIAGNOSIS — I10 Essential (primary) hypertension: Secondary | ICD-10-CM | POA: Diagnosis not present

## 2017-04-11 DIAGNOSIS — D509 Iron deficiency anemia, unspecified: Secondary | ICD-10-CM | POA: Diagnosis not present

## 2017-04-11 DIAGNOSIS — K219 Gastro-esophageal reflux disease without esophagitis: Secondary | ICD-10-CM | POA: Diagnosis not present

## 2017-04-11 DIAGNOSIS — I498 Other specified cardiac arrhythmias: Secondary | ICD-10-CM | POA: Insufficient documentation

## 2017-04-11 LAB — BASIC METABOLIC PANEL
ANION GAP: 12 (ref 5–15)
BUN: 9 mg/dL (ref 6–20)
CO2: 21 mmol/L — ABNORMAL LOW (ref 22–32)
Calcium: 9.7 mg/dL (ref 8.9–10.3)
Chloride: 106 mmol/L (ref 101–111)
Creatinine, Ser: 0.75 mg/dL (ref 0.44–1.00)
GFR calc Af Amer: >60 mL/min (ref >60)
GLUCOSE: 202 mg/dL — AB (ref 65–99)
POTASSIUM: 3.6 mmol/L (ref 3.5–5.1)
SODIUM: 139 mmol/L (ref 135–145)

## 2017-04-11 NOTE — Progress Notes (Signed)
Patient ID: Jane Arellano, female   DOB: 07/02/1961, 55 y.o.   MRN: [id number]     Primary Care Physician: Jegede, Olugbemiga E, MD Referring Physician: Dr. Klein   Jane Arellano is a 55 y.o. female with a h/o PAF in the afib clinic for evaluation. Dx'ed 6/14, asymtpomatic, picked up on physical exam. Apparently very little afib burden since then.  She reports that she feels well other than dealing with peripheral neuropathy from her DM. She has not noticed any reoccurring afib. Continues on DOAC, carvedilol.   Lifestyle habits reviewed and pt reports very little caffeine, no alcohol, walks daily. Denies snoring. Discussed avoiding decongestants going into cold/flu season.  Returns to afib clinic,5/22, and reports no irregular heart beat. Overall no complaints. She is pending a colonoscopy and will need to hold DOAC x 2 days before procedure.   Today, she denies symptoms of palpitations, chest pain, shortness of breath, orthopnea, PND, lower extremity edema, dizziness, presyncope, syncope, or neurologic sequela. The patient is tolerating medications without difficulties and is otherwise without complaint today.   Past Medical History:  Diagnosis Date  . Atypical chest pain    a. 11/2005 Negative Myoview  . Diastolic dysfunction   . DM2 (diabetes mellitus, type 2) (HCC)   . Dysrhythmia    AFIB HX CV   . GERD (gastroesophageal reflux disease)   . Heart murmur   . History of substance abuse   . HTN (hypertension)   . Iron deficiency anemia   . Paroxysmal atrial fibrillation (HCC) 05/01/2013   On Xarelto  . Subaortic membrane    a. 01/2010 Echo: EF 55-60%, No rwma, subaortic membrane with elevated LVOT mean gradient of 21 mmHg, Triv AI, mod dil LA, mildly increased PASP. b. 05/02/2013 Echo:  LVEF 60-65%, grade 1 diastolic dysfunction, mild LVH, subaortic stenosis w/ turbulation in LVOT c/w subaortic membrane (mean gradient 42 mmHg/peak gradient 81 mmHg), mild biatrial enlargement    Past Surgical History:  Procedure Laterality Date  . CARDIOVERSION N/A 11/04/2013   Procedure: CARDIOVERSION;  Surgeon: Peter M Jordan, MD;  Location: [facility];  Service: Cardiovascular;  Laterality: N/A;  . CESAREAN SECTION      Current Outpatient Prescriptions  Medication Sig Dispense Refill  . ACCU-CHEK AVIVA PLUS test strip USE AS INSTRUCTED 100 each 12  . ACCU-CHEK SOFTCLIX LANCETS lancets Use as instructed 100 each 5  . amitriptyline (ELAVIL) 50 MG tablet TAKE 1 TABLET BY MOUTH AT BEDTIME. 30 tablet 3  . amLODipine (NORVASC) 10 MG tablet TAKE 1 TABLET BY MOUTH DAILY. 30 tablet 0  . Blood Glucose Calibration (ACCU-CHEK AVIVA) SOLN USE AS INSTRUCTED 1 each 0  . Blood Glucose Monitoring Suppl (ACCU-CHEK AVIVA PLUS) w/Device KIT USE AS DIRECTED TWICE DAILY AT 8 AM AND 10 PM 1 kit 0  . Calcium Carb-Cholecalciferol (CALCIUM-VITAMIN D3) 500-400 MG-UNIT TABS Take 1 tablet by mouth daily.    . carvedilol (COREG) 3.125 MG tablet TAKE 1 TABLET BY MOUTH 2 TIMES DAILY. 60 tablet 0  . dabigatran (PRADAXA) 150 MG CAPS capsule Take 1 capsule (150 mg total) by mouth 2 (two) times daily. 180 capsule 3  . famotidine (PEPCID) 20 MG tablet TAKE 1 TABLET BY MOUTH 2 TIMES DAILY. 60 tablet 0  . fenofibrate (TRICOR) 145 MG tablet TAKE 1 TABLET BY MOUTH DAILY. 30 tablet 0  . ferrous sulfate 325 (65 FE) MG tablet Take 1 tablet (325 mg total) by mouth 3 (three) times daily with meals.    .   fish oil-omega-3 fatty acids 1000 MG capsule Take 1 g by mouth daily.    . furosemide (LASIX) 20 MG tablet TAKE 1 TABLET BY MOUTH DAILY. 30 tablet 0  . gabapentin (NEURONTIN) 300 MG capsule TAKE 1 CAPSULE BY MOUTH 3 TIMES DAILY 90 capsule 0  . GAVILAX powder MIX 1 CAPFUL WITH LIQUID AND DRINK ONCE DAILY 476 g 2  . HYDROcodone-acetaminophen (HYCET) 7.5-325 mg/15 ml solution Take 10 mLs by mouth 4 (four) times daily as needed for moderate pain. 120 mL 0  . hydrOXYzine (ATARAX/VISTARIL) 25 MG tablet Take 1 tablet (25 mg  total) by mouth 3 (three) times daily as needed for anxiety. 60 tablet 2  . loratadine (CLARITIN) 10 MG tablet Take 1 tablet (10 mg total) by mouth daily. 30 tablet 0  . metFORMIN (GLUCOPHAGE) 500 MG tablet TAKE 1 TABLET BY MOUTH 2 TIMES DAILY WITH A MEAL. 60 tablet 0  . Multiple Vitamin (MULTIVITAMIN WITH MINERALS) TABS tablet Take 1 tablet by mouth daily.    Marland Kitchen terbinafine (LAMISIL) 250 MG tablet TAKE 1 TABLET BY MOUTH DAILY. 90 tablet 1   No current facility-administered medications for this encounter.     Allergies  Allergen Reactions  . Xarelto [Rivaroxaban] Swelling    Gums bled, headaches  . Ketorolac Tromethamine Other (See Comments)    Unknown reaction    Social History   Social History  . Marital status: Single    Spouse name: N/A  . Number of children: N/A  . Years of education: N/A   Occupational History  . Not on file.   Social History Main Topics  . Smoking status: Never Smoker  . Smokeless tobacco: Current User    Types: Snuff  . Alcohol use No     Comment: Drinks 3 - 40 oz beers daily.  i QUIT DOING THAT "  . Drug use: No     Comment: Smokes marijuana weekly.  Has not used cocaine in 9 years.  . Sexual activity: Yes    Birth control/ protection: None   Other Topics Concern  . Not on file   Social History Narrative   Lives in [place] by herself.  She had been caring for her mother but she died 2 mos ago.  She tries to remain active but does not regularly exercise.    Family History  Problem Relation Age of Onset  . Diabetes Father        deceased  . Cancer Mother 11       colon  . Stroke Mother   . Hypertension Sister        alive and well  . Hypertension Sister        alive and well  . Heart attack Brother        deceased @ 75    ROS- All systems are reviewed and negative except as per the HPI above  Physical Exam: Vitals:   04/11/17 0931  BP: (!) 148/86  Pulse: 69  Weight: 191 lb 3.2 oz (86.7 kg)  Height: 5' 7" (1.702 m)    GEN- The  patient is well appearing, alert and oriented x 3 today.   Head- normocephalic, atraumatic Eyes-  Sclera clear, conjunctiva pink Ears- hearing intact Oropharynx- clear Neck- supple, no JVP Lymph- no cervical lymphadenopathy Lungs- Clear to ausculation bilaterally, normal work of breathing Heart- Regular rate and rhythm, 2/6 systolic murmiur murmurs, rubs or gallops, PMI not laterally displaced GI- soft, NT, ND, + BS Extremities- no clubbing, cyanosis,  or edema MS- no significant deformity or atrophy Skin- no rash or lesion Psych- euthymic mood, full affect Neuro- strength and sensation are intact  EKG-SR with sinus arrythmia, 69 bpm, qrs int 82 ms, qtc 469 ms Epic records reviewed  Assessment and Plan: 1. PAF Continues in SR Continue carvedilol Continue pradaxa for chadsvasc of at least 3 May hold 2 days for colonoscopy and restart asap after procedure  2. HTN Stable   F/u in afib clinic in 6 months  Donna C. Carroll, ANP-C [facility] [address] [place], [place] [address] [phone]  

## 2017-04-19 ENCOUNTER — Ambulatory Visit: Payer: Medicaid Other

## 2017-04-24 ENCOUNTER — Ambulatory Visit
Admission: RE | Admit: 2017-04-24 | Discharge: 2017-04-24 | Disposition: A | Discharge status: Home / Self Care | Payer: Medicaid Other | Source: Ambulatory Visit | Attending: Internal Medicine | Admitting: Internal Medicine

## 2017-04-24 DIAGNOSIS — Z1231 Encounter for screening mammogram for malignant neoplasm of breast: Secondary | ICD-10-CM

## 2017-04-25 ENCOUNTER — Telehealth: Payer: Self-pay | Admitting: *Deleted

## 2017-04-25 NOTE — Telephone Encounter (Signed)
Patient verified DOB Patient is aware of mammogram showing no evidence of malignancy. A recommended screening may be completed in one. Patient had no further questions.

## 2017-04-25 NOTE — Telephone Encounter (Signed)
-----   Message from Quentin Angstlugbemiga E Jegede, MD sent at 04/24/2017  3:49 PM EDT ----- Please inform patient that her screening mammogram shows no evidence of malignancy. Recommend screening mammogram in one year

## 2017-04-26 ENCOUNTER — Ambulatory Visit: Payer: Medicaid Other | Attending: Internal Medicine | Admitting: Internal Medicine

## 2017-04-26 ENCOUNTER — Other Ambulatory Visit (HOSPITAL_COMMUNITY)
Admission: RE | Admit: 2017-04-26 | Discharge: 2017-04-26 | Disposition: A | Discharge status: Home / Self Care | Payer: Medicaid Other | Source: Ambulatory Visit | Attending: Internal Medicine | Admitting: Internal Medicine

## 2017-04-26 VITALS — BP 133/77 | HR 77 | Temp 98.0°F | Resp 18 | Ht 67.0 in | Wt 192.0 lb

## 2017-04-26 DIAGNOSIS — E118 Type 2 diabetes mellitus with unspecified complications: Secondary | ICD-10-CM | POA: Insufficient documentation

## 2017-04-26 DIAGNOSIS — F418 Other specified anxiety disorders: Secondary | ICD-10-CM

## 2017-04-26 DIAGNOSIS — I10 Essential (primary) hypertension: Secondary | ICD-10-CM | POA: Diagnosis not present

## 2017-04-26 DIAGNOSIS — E785 Hyperlipidemia, unspecified: Secondary | ICD-10-CM | POA: Diagnosis not present

## 2017-04-26 DIAGNOSIS — K219 Gastro-esophageal reflux disease without esophagitis: Secondary | ICD-10-CM

## 2017-04-26 DIAGNOSIS — E1142 Type 2 diabetes mellitus with diabetic polyneuropathy: Secondary | ICD-10-CM | POA: Diagnosis not present

## 2017-04-26 LAB — POCT URINALYSIS DIPSTICK
Bilirubin, UA: NEGATIVE
Blood, UA: NEGATIVE
Glucose, UA: NEGATIVE
KETONES UA: NEGATIVE
LEUKOCYTES UA: NEGATIVE
Nitrite, UA: NEGATIVE
PROTEIN UA: NEGATIVE
Spec Grav, UA: <=1.005 — AB (ref 1.010–1.025)
Urobilinogen, UA: 0.2 E.U./dL
pH, UA: 6 (ref 5.0–8.0)

## 2017-04-26 LAB — POCT GLYCOSYLATED HEMOGLOBIN (HGB A1C): Hemoglobin A1C: 7.1

## 2017-04-26 LAB — GLUCOSE, POCT (MANUAL RESULT ENTRY): POC GLUCOSE: 139 mg/dL — AB (ref 70–99)

## 2017-04-26 LAB — POCT UA - MICROALBUMIN
Albumin/Creatinine Ratio, Urine, POC: <30
Creatinine, POC: 50 mg/dL
Microalbumin Ur, POC: 10 mg/L

## 2017-04-26 MED ORDER — FAMOTIDINE 20 MG PO TABS
20.0000 mg | ORAL_TABLET | Freq: Two times a day (BID) | ORAL | 3 refills | Status: DC
Start: 2017-04-26 — End: 2017-10-03

## 2017-04-26 MED ORDER — ACETAMINOPHEN-CODEINE #3 300-30 MG PO TABS: 1.0000 | ORAL_TABLET | ORAL | 0 refills | Status: DC | PRN

## 2017-04-26 MED ORDER — AMITRIPTYLINE HCL 50 MG PO TABS: 50.0000 mg | ORAL_TABLET | Freq: Every day | ORAL | 3 refills | Status: DC

## 2017-04-26 MED ORDER — METFORMIN HCL 500 MG PO TABS: ORAL_TABLET | ORAL | 3 refills | Status: DC

## 2017-04-26 MED ORDER — HYDROXYZINE HCL 25 MG PO TABS: 25.0000 mg | ORAL_TABLET | Freq: Three times a day (TID) | ORAL | 2 refills | Status: DC | PRN

## 2017-04-26 MED ORDER — FUROSEMIDE 20 MG PO TABS: 20.0000 mg | ORAL_TABLET | Freq: Every day | ORAL | 0 refills | Status: DC

## 2017-04-26 MED ORDER — LORATADINE 10 MG PO TABS: 10.0000 mg | ORAL_TABLET | Freq: Every day | ORAL | 0 refills | Status: DC

## 2017-04-26 MED ORDER — FERROUS SULFATE 325 (65 FE) MG PO TABS: 325.0000 mg | ORAL_TABLET | Freq: Three times a day (TID) | ORAL | 3 refills | Status: DC

## 2017-04-26 MED ORDER — AMLODIPINE BESYLATE 10 MG PO TABS: 10.0000 mg | ORAL_TABLET | Freq: Every day | ORAL | 0 refills | Status: DC

## 2017-04-26 MED ORDER — CARVEDILOL 3.125 MG PO TABS: 3.1250 mg | ORAL_TABLET | Freq: Two times a day (BID) | ORAL | 3 refills | Status: DC

## 2017-04-26 MED ORDER — FENOFIBRATE 145 MG PO TABS: 145.0000 mg | ORAL_TABLET | Freq: Every day | ORAL | 3 refills | Status: DC

## 2017-04-26 MED ORDER — GABAPENTIN 300 MG PO CAPS: 300.0000 mg | ORAL_CAPSULE | Freq: Three times a day (TID) | ORAL | 3 refills | Status: DC

## 2017-04-26 MED FILL — FERROUS SULFATE 325 MG TAB: 325 (65 FE) | 30 days supply | Qty: 90 | Fill #0

## 2017-04-26 MED FILL — hydrOXYzine HCL 25 MG TABS: 25 | 20 days supply | Qty: 60 | Fill #0

## 2017-04-26 MED FILL — CARVEDILOL 3.125 MG TABLET: 3.125 | 30 days supply | Qty: 60 | Fill #0

## 2017-04-26 MED FILL — FAMOTIDINE 20 MG TABLET: 20 | 30 days supply | Qty: 60 | Fill #0

## 2017-04-26 MED FILL — ACETAMINOPHEN/COD #3 TABLET: 300-30 | 5 days supply | Qty: 30 | Fill #0

## 2017-04-26 MED FILL — AMITRIPTYLINE HCL 50 MG TAB: 50 | 30 days supply | Qty: 30 | Fill #0

## 2017-04-26 NOTE — Progress Notes (Signed)
Jane Arellano, is a 56 y.o. female  CBS:[id number]  WGY:[id number]  DOB - 1961-03-13  Chief Complaint  Patient presents with  . URI       Subjective:   Jane Arellano is a 56 y.o. female with history of paroxysmal atrial fibrillation on Pradaxa and carvedilol, hypertension, type 2 diabetes mellitus, diastolic heart dysfunction, subaortic membrane status post resection in 2015 here today for a routine follow up visit and medication refill. She has no major complaint today but for ongoing pain related to her diabetic neuropathy. She also needs refill of her anxiety medication and elavil. Patient denies any suicidal ideation or thoughts. Patient has No headache, No chest pain, No abdominal pain - No Nausea, No new weakness tingling or numbness, No Cough - SOB.  No problems updated.  ALLERGIES: Allergies  Allergen Reactions  . Xarelto [Rivaroxaban] Swelling    Gums bled, headaches  . Ketorolac Tromethamine Other (See Comments)    Unknown reaction    PAST MEDICAL HISTORY: Past Medical History:  Diagnosis Date  . Atypical chest pain    a. 11/2005 Negative Myoview  . Diastolic dysfunction   . DM2 (diabetes mellitus, type 2) ([place])   . Dysrhythmia    AFIB HX CV   . GERD (gastroesophageal reflux disease)   . Heart murmur   . History of substance abuse   . HTN (hypertension)   . Iron deficiency anemia   . Paroxysmal atrial fibrillation ([place]) 05/01/2013   On Xarelto  . Subaortic membrane    a. 01/2010 Echo: EF 55-60%, No rwma, subaortic membrane with elevated LVOT mean gradient of 21 mmHg, Triv AI, mod dil LA, mildly increased PASP. b. 05/02/2013 Echo:  LVEF 77-93%, grade 1 diastolic dysfunction, mild LVH, subaortic stenosis w/ turbulation in LVOT c/w subaortic membrane (mean gradient 42 mmHg/peak gradient 81 mmHg), mild biatrial enlargement    MEDICATIONS AT HOME: Prior to Admission medications   Medication Sig Start Date End Date Taking? Authorizing Provider  ACCU-CHEK AVIVA  PLUS test strip USE AS INSTRUCTED 03/06/17   Tresa Garter, MD  ACCU-CHEK SOFTCLIX LANCETS lancets Use as instructed 01/04/16   Tresa Garter, MD  acetaminophen-codeine (TYLENOL #3) 300-30 MG tablet Take 1 tablet by mouth every 4 (four) hours as needed. 04/26/17   Tresa Garter, MD  amitriptyline (ELAVIL) 50 MG tablet Take 1 tablet (50 mg total) by mouth at bedtime. 04/26/17   Tresa Garter, MD  amLODipine (NORVASC) 10 MG tablet Take 1 tablet (10 mg total) by mouth daily. 04/26/17   Tresa Garter, MD  Blood Glucose Calibration (ACCU-CHEK AVIVA) SOLN USE AS INSTRUCTED 01/11/16   Tresa Garter, MD  Blood Glucose Monitoring Suppl (ACCU-CHEK AVIVA PLUS) w/Device KIT USE AS DIRECTED TWICE DAILY AT 8 AM AND 10 PM 01/04/16   [place] Bula, Marlena Clipper, MD  Calcium Carb-Cholecalciferol (CALCIUM-VITAMIN D3) 500-400 MG-UNIT TABS Take 1 tablet by mouth daily.    [provider]  carvedilol (COREG) 3.125 MG tablet Take 1 tablet (3.125 mg total) by mouth 2 (two) times daily. 04/26/17   Tresa Garter, MD  dabigatran (PRADAXA) 150 MG CAPS capsule Take 1 capsule (150 mg total) by mouth 2 (two) times daily. 08/16/16   Deboraha Sprang, MD  famotidine (PEPCID) 20 MG tablet Take 1 tablet (20 mg total) by mouth 2 (two) times daily. 04/26/17   Tresa Garter, MD  fenofibrate (TRICOR) 145 MG tablet Take 1 tablet (145 mg total) by mouth daily. 04/26/17  Tresa Garter, MD  ferrous sulfate 325 (65 FE) MG tablet Take 1 tablet (325 mg total) by mouth 3 (three) times daily with meals. 04/26/17   Tresa Garter, MD  fish oil-omega-3 fatty acids 1000 MG capsule Take 1 g by mouth daily.    [provider]  furosemide (LASIX) 20 MG tablet Take 1 tablet (20 mg total) by mouth daily. 04/26/17   Tresa Garter, MD  gabapentin (NEURONTIN) 300 MG capsule Take 1 capsule (300 mg total) by mouth 3 (three) times daily. 04/26/17   Tresa Garter, MD  GAVILAX powder MIX 1  CAPFUL WITH LIQUID AND DRINK ONCE DAILY 02/05/16   Lance Bosch, NP  HYDROcodone-acetaminophen (HYCET) 7.5-325 mg/15 ml solution Take 10 mLs by mouth 4 (four) times daily as needed for moderate pain. 07/15/16   Konrad Felix, PA  hydrOXYzine (ATARAX/VISTARIL) 25 MG tablet Take 1 tablet (25 mg total) by mouth 3 (three) times daily as needed for anxiety. 04/26/17   Tresa Garter, MD  loratadine (CLARITIN) 10 MG tablet Take 1 tablet (10 mg total) by mouth daily. 04/26/17   Tresa Garter, MD  metFORMIN (GLUCOPHAGE) 500 MG tablet TAKE 1 TABLET BY MOUTH 2 TIMES DAILY WITH A MEAL. 04/26/17   Tresa Garter, MD  Multiple Vitamin (MULTIVITAMIN WITH MINERALS) TABS tablet Take 1 tablet by mouth daily.    [provider]  terbinafine (LAMISIL) 250 MG tablet TAKE 1 TABLET BY MOUTH DAILY. 12/09/16   Tresa Garter, MD    Objective:   Vitals:   04/26/17 1204  BP: 133/77  Pulse: 77  Resp: 18  Temp: 98 F (36.7 C)  TempSrc: Oral  SpO2: 100%  Weight: 192 lb (87.1 kg)  Height: _0  (1.702 m)   Exam General appearance : Awake, alert, not in any distress. Speech Clear. Not toxic looking HEENT: Atraumatic and Normocephalic, pupils equally reactive to light and accomodation Neck: Supple, no JVD. No cervical lymphadenopathy.  Chest: Good air entry bilaterally, no added sounds  CVS: S1 S2 regular, no murmurs.  Abdomen: Bowel sounds present, Non tender and not distended with no gaurding, rigidity or rebound. Extremities: B/L Lower Ext shows no edema, both legs are warm to touch Neurology: Awake alert, and oriented X 3, CN II-XII intact, Non focal Skin: No Rash  Data Review Lab Results  Component Value Date   HGBA1C 7.1 04/26/2017   HGBA1C 7.1 08/18/2016   HGBA1C 6.6 01/14/2016    Assessment & Plan   1. Type 2 diabetes mellitus with complication, without long-term current use of insulin (HCC)  - POCT A1C - Glucose (CBG) - POCT UA - Microalbumin Refill -  gabapentin (NEURONTIN) 300 MG capsule; Take 1 capsule (300 mg total) by mouth 3 (three) times daily.  Dispense: 90 capsule; Refill: 3 - metFORMIN (GLUCOPHAGE) 500 MG tablet; TAKE 1 TABLET BY MOUTH 2 TIMES DAILY WITH A MEAL.  Dispense: 180 tablet; Refill: 3  2. Gastroesophageal reflux disease without esophagitis Refill - famotidine (PEPCID) 20 MG tablet; Take 1 tablet (20 mg total) by mouth 2 (two) times daily.  Dispense: 60 tablet; Refill: 3 - ferrous sulfate 325 (65 FE) MG tablet; Take 1 tablet (325 mg total) by mouth 3 (three) times daily with meals.  Dispense: 90 tablet; Refill: 3  3. Essential hypertension Refill - amLODipine (NORVASC) 10 MG tablet; Take 1 tablet (10 mg total) by mouth daily.  Dispense: 30 tablet; Refill: 0 - carvedilol (COREG) 3.125 MG tablet;  Take 1 tablet (3.125 mg total) by mouth 2 (two) times daily.  Dispense: 60 tablet; Refill: 3 - furosemide (LASIX) 20 MG tablet; Take 1 tablet (20 mg total) by mouth daily.  Dispense: 30 tablet; Refill: 0  4. Diabetic polyneuropathy associated with type 2 diabetes mellitus (HCC) Refill - gabapentin (NEURONTIN) 300 MG capsule; Take 1 capsule (300 mg total) by mouth 3 (three) times daily.  Dispense: 90 capsule; Refill: 3 - amitriptyline (ELAVIL) 50 MG tablet; Take 1 tablet (50 mg total) by mouth at bedtime.  Dispense: 30 tablet; Refill: 3  5. Depression with anxiety Refill - hydrOXYzine (ATARAX/VISTARIL) 25 MG tablet; Take 1 tablet (25 mg total) by mouth 3 (three) times daily as needed for anxiety.  Dispense: 60 tablet; Refill: 2  6. Dyslipidemia Refill - fenofibrate (TRICOR) 145 MG tablet; Take 1 tablet (145 mg total) by mouth daily.  Dispense: 30 tablet; Refill: 3  Patient have been counseled extensively about nutrition and exercise. Other issues discussed during this visit include: low cholesterol diet, weight control and daily exercise, foot care, annual eye examinations at Ophthalmology, importance of adherence with  medications and regular follow-up. We also discussed long term complications of uncontrolled diabetes and hypertension.   Return in about 3 months (around 07/27/2017) for Hemoglobin A1C and Follow up, DM, Follow up HTN, Generalized Anxiety Disorder.  The patient was given clear instructions to go to ER or return to medical center if symptoms don't improve, worsen or new problems develop. The patient verbalized understanding. The patient was told to call to get lab results if they haven't heard anything in the next week.   This note has been created with Surveyor, quantity. Any transcriptional errors are unintentional.    Angelica Chessman, MD, [place], [place], [place], [place] [facility] [place] [facility] [place], [place]   04/26/2017, 12:27 PM

## 2017-04-26 NOTE — Patient Instructions (Signed)
Diabetes Mellitus and Food It is important for you to manage your blood sugar (glucose) level. Your blood glucose level can be greatly affected by what you eat. Eating healthier foods in the appropriate amounts throughout the day at about the same time each day will help you control your blood glucose level. It can also help slow or prevent worsening of your diabetes mellitus. Healthy eating may even help you improve the level of your blood pressure and reach or maintain a healthy weight. General recommendations for healthful eating and cooking habits include:  Eating meals and snacks regularly. Avoid going long periods of time without eating to lose weight.  Eating a diet that consists mainly of plant-based foods, such as fruits, vegetables, nuts, legumes, and whole grains.  Using low-heat cooking methods, such as baking, instead of high-heat cooking methods, such as deep frying.  Work with your dietitian to make sure you understand how to use the Nutrition Facts information on food labels. How can food affect me? Carbohydrates Carbohydrates affect your blood glucose level more than any other type of food. Your dietitian will help you determine how many carbohydrates to eat at each meal and teach you how to count carbohydrates. Counting carbohydrates is important to keep your blood glucose at a healthy level, especially if you are using insulin or taking certain medicines for diabetes mellitus. Alcohol Alcohol can cause sudden decreases in blood glucose (hypoglycemia), especially if you use insulin or take certain medicines for diabetes mellitus. Hypoglycemia can be a life-threatening condition. Symptoms of hypoglycemia (sleepiness, dizziness, and disorientation) are similar to symptoms of having too much alcohol. If your health care provider has given you approval to drink alcohol, do so in moderation and use the following guidelines:  Women should not have more than one drink per day, and men  should not have more than two drinks per day. One drink is equal to: ? 12 oz of beer. ? 5 oz of wine. ? 1 oz of hard liquor.  Do not drink on an empty stomach.  Keep yourself hydrated. Have water, diet soda, or unsweetened iced tea.  Regular soda, juice, and other mixers might contain a lot of carbohydrates and should be counted.  What foods are not recommended? As you make food choices, it is important to remember that all foods are not the same. Some foods have fewer nutrients per serving than other foods, even though they might have the same number of calories or carbohydrates. It is difficult to get your body what it needs when you eat foods with fewer nutrients. Examples of foods that you should avoid that are high in calories and carbohydrates but low in nutrients include:  Trans fats (most processed foods list trans fats on the Nutrition Facts label).  Regular soda.  Juice.  Candy.  Sweets, such as cake, pie, doughnuts, and cookies.  Fried foods.  What foods can I eat? Eat nutrient-rich foods, which will nourish your body and keep you healthy. The food you should eat also will depend on several factors, including:  The calories you need.  The medicines you take.  Your weight.  Your blood glucose level.  Your blood pressure level.  Your cholesterol level.  You should eat a variety of foods, including:  Protein. ? Lean cuts of meat. ? Proteins low in saturated fats, such as fish, egg whites, and beans. Avoid processed meats.  Fruits and vegetables. ? Fruits and vegetables that may help control blood glucose levels, such as apples,   mangoes, and yams.  Dairy products. ? Choose fat-free or low-fat dairy products, such as milk, yogurt, and cheese.  Grains, bread, pasta, and rice. ? Choose whole grain products, such as multigrain bread, whole oats, and brown rice. These foods may help control blood pressure.  Fats. ? Foods containing healthful fats, such as  nuts, avocado, olive oil, canola oil, and fish.  Does everyone with diabetes mellitus have the same meal plan? Because every person with diabetes mellitus is different, there is not one meal plan that works for everyone. It is very important that you meet with a dietitian who will help you create a meal plan that is just right for you. This information is not intended to replace advice given to you by your health care provider. Make sure you discuss any questions you have with your health care provider. Document Released: 08/04/2005 Document Revised: 04/14/2016 Document Reviewed: 10/04/2013 Elsevier Interactive Patient Education  2017 Else[employer] Inc. Diabetes Mellitus and Exercise Exercising regularly is important for your overall health, especially when you have diabetes (diabetes mellitus). Exercising is not only about losing weight. It has many health benefits, such as increasing muscle strength and bone density and reducing body fat and stress. This leads to improved fitness, flexibility, and endurance, all of which result in better overall health. Exercise has additional benefits for people with diabetes, including:  Reducing appetite.  Helping to lower and control blood glucose.  Lowering blood pressure.  Helping to control amounts of fatty substances (lipids) in the blood, such as cholesterol and triglycerides.  Helping the body to respond better to insulin (improving insulin sensitivity).  Reducing how much insulin the body needs.  Decreasing the risk for heart disease by: ? Lowering cholesterol and triglyceride levels. ? Increasing the levels of good cholesterol. ? Lowering blood glucose levels.  What is my activity plan? Your health care provider or certified diabetes educator can help you make a plan for the type and frequency of exercise (activity plan) that works for you. Make sure that you:  Do at least 150 minutes of moderate-intensity or vigorous-intensity exercise each  week. This could be brisk walking, biking, or water aerobics. ? Do stretching and strength exercises, such as yoga or weightlifting, at least 2 times a week. ? Spread out your activity over at least 3 days of the week.  Get some form of physical activity every day. ? Do not go more than 2 days in a row without some kind of physical activity. ? Avoid being inactive for more than 90 minutes at a time. Take frequent breaks to walk or stretch.  Choose a type of exercise or activity that you enjoy, and set realistic goals.  Start slowly, and gradually increase the intensity of your exercise over time.  What do I need to know about managing my diabetes?  Check your blood glucose before and after exercising. ? If your blood glucose is higher than 240 mg/dL (13.3 mmol/L) before you exercise, check your urine for ketones. If you have ketones in your urine, do not exercise until your blood glucose returns to normal.  Know the symptoms of low blood glucose (hypoglycemia) and how to treat it. Your risk for hypoglycemia increases during and after exercise. Common symptoms of hypoglycemia can include: ? Hunger. ? Anxiety. ? Sweating and feeling clammy. ? Confusion. ? Dizziness or feeling light-headed. ? Increased heart rate or palpitations. ? Blurry vision. ? Tingling or numbness around the mouth, lips, or tongue. ? Tremors or shakes. ?   Irritability.  Keep a rapid-acting carbohydrate snack available before, during, and after exercise to help prevent or treat hypoglycemia.  Avoid injecting insulin into areas of the body that are going to be exercised. For example, avoid injecting insulin into: ? The arms, when playing tennis. ? The legs, when jogging.  Keep records of your exercise habits. Doing this can help you and your health care provider adjust your diabetes management plan as needed. Write down: ? Food that you eat before and after you exercise. ? Blood glucose levels before and after you  exercise. ? The type and amount of exercise you have done. ? When your insulin is expected to peak, if you use insulin. Avoid exercising at times when your insulin is peaking.  When you start a new exercise or activity, work with your health care provider to make sure the activity is safe for you, and to adjust your insulin, medicines, or food intake as needed.  Drink plenty of water while you exercise to prevent dehydration or heat stroke. Drink enough fluid to keep your urine clear or pale yellow. This information is not intended to replace advice given to you by your health care provider. Make sure you discuss any questions you have with your health care provider. Document Released: 01/28/2004 Document Revised: 05/27/2016 Document Reviewed: 04/18/2016 Elsevier Interactive Patient Education  2018 Elsevier Inc. Blood Glucose Monitoring, Adult Monitoring your blood sugar (glucose) helps you manage your diabetes. It also helps you and your health care provider determine how well your diabetes management plan is working. Blood glucose monitoring involves checking your blood glucose as often as directed, and keeping a record (log) of your results over time. Why should I monitor my blood glucose? Checking your blood glucose regularly can:  Help you understand how food, exercise, illnesses, and medicines affect your blood glucose.  Let you know what your blood glucose is at any time. You can quickly tell if you are having low blood glucose (hypoglycemia) or high blood glucose (hyperglycemia).  Help you and your health care provider adjust your medicines as needed.  When should I check my blood glucose? Follow instructions from your health care provider about how often to check your blood glucose. This may depend on:  The type of diabetes you have.  How well-controlled your diabetes is.  Medicines you are taking.  If you have type 1 diabetes:  Check your blood glucose at least 2 times a  day.  Also check your blood glucose: ? Before every insulin injection. ? Before and after exercise. ? Between meals. ? 2 hours after a meal. ? Occasionally between 2:00 a.m. and 3:00 a.m., as directed. ? Before potentially dangerous tasks, like driving or using heavy machinery. ? At bedtime.  You may need to check your blood glucose more often, up to 6-10 times a day: ? If you use an insulin pump. ? If you need multiple daily injections (MDI). ? If your diabetes is not well-controlled. ? If you are ill. ? If you have a history of severe hypoglycemia. ? If you have a history of not knowing when your blood glucose is getting low (hypoglycemia unawareness). If you have type 2 diabetes:  If you take insulin or other diabetes medicines, check your blood glucose at least 2 times a day.  If you are on intensive insulin therapy, check your blood glucose at least 4 times a day. Occasionally, you may also need to check between 2:00 a.m. and 3:00 a.m., as directed.    Also check your blood glucose: ? Before and after exercise. ? Before potentially dangerous tasks, like driving or using heavy machinery.  You may need to check your blood glucose more often if: ? Your medicine is being adjusted. ? Your diabetes is not well-controlled. ? You are ill. What is a blood glucose log?  A blood glucose log is a record of your blood glucose readings. It helps you and your health care provider: ? Look for patterns in your blood glucose over time. ? Adjust your diabetes management plan as needed.  Every time you check your blood glucose, write down your result and notes about things that may be affecting your blood glucose, such as your diet and exercise for the day.  Most glucose meters store a record of glucose readings in the meter. Some meters allow you to download your records to a computer. How do I check my blood glucose? Follow these steps to get accurate readings of your blood  glucose: Supplies needed   Blood glucose meter.  Test strips for your meter. Each meter has its own strips. You must use the strips that come with your meter.  A needle to prick your finger (lancet). Do not use lancets more than once.  A device that holds the lancet (lancing device).  A journal or log book to write down your results. Procedure  Wash your hands with soap and water.  Prick the side of your finger (not the tip) with the lancet. Use a different finger each time.  Gently rub the finger until a small drop of blood appears.  Follow instructions that come with your meter for inserting the test strip, applying blood to the strip, and using your blood glucose meter.  Write down your result and any notes. Alternative testing sites  Some meters allow you to use areas of your body other than your finger (alternative sites) to test your blood.  If you think you may have hypoglycemia, or if you have hypoglycemia unawareness, do not use alternative sites. Use your finger instead.  Alternative sites may not be as accurate as the fingers, because blood flow is slower in these areas. This means that the result you get may be delayed, and it may be different from the result that you would get from your finger.  The most common alternative sites are: ? Forearm. ? Thigh. ? Palm of the hand. Additional tips  Always keep your supplies with you.  If you have questions or need help, all blood glucose meters have a 24-hour "hotline" number that you can call. You may also contact your health care provider.  After you use a few boxes of test strips, adjust (calibrate) your blood glucose meter by following instructions that came with your meter. This information is not intended to replace advice given to you by your health care provider. Make sure you discuss any questions you have with your health care provider. Document Released: 11/10/2003 Document Revised: 05/27/2016 Document  Reviewed: 04/18/2016 Elsevier Interactive Patient Education  2017 Elsevier Inc.  

## 2017-04-27 LAB — URINE CYTOLOGY ANCILLARY ONLY
CHLAMYDIA, DNA PROBE: NEGATIVE
Neisseria Gonorrhea: NEGATIVE
TRICH (WINDOWPATH): NEGATIVE

## 2017-05-01 ENCOUNTER — Other Ambulatory Visit: Payer: Self-pay | Admitting: Internal Medicine

## 2017-05-01 LAB — URINE CYTOLOGY ANCILLARY ONLY
Bacterial vaginitis: NEGATIVE
CANDIDA VAGINITIS: NEGATIVE

## 2017-05-03 ENCOUNTER — Other Ambulatory Visit: Payer: Self-pay | Admitting: Internal Medicine

## 2017-05-04 MED FILL — metFORMIN HCL 500 MG TABS: 500 | 30 days supply | Qty: 60 | Fill #0

## 2017-05-05 ENCOUNTER — Other Ambulatory Visit: Payer: Self-pay | Admitting: Internal Medicine

## 2017-05-05 MED FILL — FENOFIBRATE 145 MG TABLET: 145 | 30 days supply | Qty: 30 | Fill #0

## 2017-05-05 MED FILL — FUROSEMIDE 20 MG TABLET: 20 | 30 days supply | Qty: 30 | Fill #0

## 2017-05-05 MED FILL — AMLODIPINE BESYLATE 10 MG T: 10 | 30 days supply | Qty: 30 | Fill #0

## 2017-05-05 MED FILL — TERBINAFINE HCL 250 MG TAB: 250 | 30 days supply | Qty: 30 | Fill #5

## 2017-05-23 ENCOUNTER — Telehealth (INDEPENDENT_AMBULATORY_CARE_PROVIDER_SITE_OTHER): Payer: Self-pay | Admitting: *Deleted

## 2017-05-23 NOTE — Telephone Encounter (Signed)
-----   Message from Quentin Angstlugbemiga E Jegede, MD sent at 05/01/2017 12:05 PM EDT ----- Urine is negative for infection.

## 2017-05-23 NOTE — Telephone Encounter (Signed)
Patient verified DOB Patient is aware of urine being negative for infection. No further questions at this time.

## 2017-06-01 ENCOUNTER — Other Ambulatory Visit: Payer: Self-pay | Admitting: Internal Medicine

## 2017-06-01 DIAGNOSIS — I10 Essential (primary) hypertension: Secondary | ICD-10-CM

## 2017-06-01 MED FILL — PRADAXA 150 MG CAP: 150 | 30 days supply | Qty: 60 | Fill #4

## 2017-06-01 MED FILL — ?AMLODIPINE BESYLATE 10 MG: 10 | 30 days supply | Qty: 30 | Fill #0

## 2017-06-01 MED FILL — FUROSEMIDE 20 MG TABLET: 20 | 30 days supply | Qty: 30 | Fill #0

## 2017-06-01 MED FILL — GABAPENTIN 300 MG CAPSULE: 300 | 30 days supply | Qty: 90 | Fill #0

## 2017-06-01 MED FILL — FAMOTIDINE 20 MG TABLET: 20 | 30 days supply | Qty: 60 | Fill #1

## 2017-06-01 MED FILL — TERBINAFINE HCL 250 MG TAB: 250 | 30 days supply | Qty: 30 | Fill #0

## 2017-06-06 MED FILL — metFORMIN HCL 500 MG TABS: 500 | 30 days supply | Qty: 60 | Fill #1

## 2017-06-13 MED FILL — FENOFIBRATE 145 MG TAB: 145 | 30 days supply | Qty: 30 | Fill #1

## 2017-06-13 MED FILL — CARVEDILOL 3.125 MG TABLET: 3.125 | 30 days supply | Qty: 60 | Fill #1

## 2017-06-13 MED FILL — FERROUS SULFATE 325 MG TAB: 325 (65 FE) | 30 days supply | Qty: 90 | Fill #1

## 2017-07-03 ENCOUNTER — Other Ambulatory Visit: Payer: Self-pay | Admitting: Internal Medicine

## 2017-07-03 DIAGNOSIS — I10 Essential (primary) hypertension: Secondary | ICD-10-CM

## 2017-07-03 MED FILL — FAMOTIDINE 20 MG TABLET: 20 | 30 days supply | Qty: 60 | Fill #2

## 2017-07-03 MED FILL — FUROSEMIDE 20 MG TABLET: 20 | 30 days supply | Qty: 30 | Fill #0

## 2017-07-03 MED FILL — AMLODIPINE BESYLATE 10 MG T: 10 | 30 days supply | Qty: 30 | Fill #1

## 2017-07-19 MED FILL — TERBINAFINE HCL 250 MG TAB: 250 | 30 days supply | Qty: 30 | Fill #1

## 2017-07-19 MED FILL — CARVEDILOL 3.125 MG TABLET: 3.125 | 30 days supply | Qty: 60 | Fill #2

## 2017-07-19 MED FILL — metFORMIN HCL 500 MG TABS: 500 | 30 days supply | Qty: 60 | Fill #2

## 2017-07-19 MED FILL — FENOFIBRATE 145 MG TAB: 145 | 30 days supply | Qty: 30 | Fill #2

## 2017-07-31 ENCOUNTER — Other Ambulatory Visit: Payer: Self-pay | Admitting: Internal Medicine

## 2017-07-31 DIAGNOSIS — I10 Essential (primary) hypertension: Secondary | ICD-10-CM

## 2017-07-31 MED FILL — AMLODIPINE BESYLATE 10 MG T: 10 | 30 days supply | Qty: 30 | Fill #2

## 2017-07-31 MED FILL — GABAPENTIN 300 MG CAPSULE: 300 | 30 days supply | Qty: 90 | Fill #1

## 2017-07-31 MED FILL — FUROSEMIDE 20 MG TABLET: 20 | 30 days supply | Qty: 30 | Fill #0

## 2017-08-18 ENCOUNTER — Other Ambulatory Visit: Payer: Self-pay | Admitting: Internal Medicine

## 2017-08-18 NOTE — Telephone Encounter (Signed)
Age 56 years Wt 87.1kg 04/26/2017 Saw Rudi Coco in [facility] on 04/11/2017 04/11/2017 SrCr 0.75 08/18/2016 Hgb 12.2 HCT 37.2 CrCl 113.79  Refill done for Pradaxa 150 mg q 12 hours as requested

## 2017-08-21 MED FILL — PRADAXA 150 MG CAP: 150 | 30 days supply | Qty: 60 | Fill #0

## 2017-08-21 MED FILL — TERBINAFINE HCL 250 MG TAB: 250 | 30 days supply | Qty: 30 | Fill #2

## 2017-08-21 MED FILL — FERROUS SULFATE 325 MG TAB: 325 (65 FE) | 30 days supply | Qty: 90 | Fill #2

## 2017-08-21 MED FILL — ?FENOFIBRATE 145 MG TABS: 145 | 30 days supply | Qty: 30 | Fill #3

## 2017-08-28 MED FILL — FAMOTIDINE 20 MG TABLET: 20 | 30 days supply | Qty: 60 | Fill #3

## 2017-08-28 MED FILL — AMITRIPTYLINE HCL 50 MG TAB: 50 | 30 days supply | Qty: 30 | Fill #1

## 2017-08-28 MED FILL — metFORMIN HCL 500 MG TABS: 500 | 30 days supply | Qty: 60 | Fill #3

## 2017-08-28 MED FILL — CARVEDILOL 3.125 MG TABLET: 3.125 | 30 days supply | Qty: 60 | Fill #3

## 2017-08-30 ENCOUNTER — Ambulatory Visit: Payer: Self-pay

## 2017-09-04 ENCOUNTER — Other Ambulatory Visit: Payer: Self-pay | Admitting: Internal Medicine

## 2017-09-04 DIAGNOSIS — I10 Essential (primary) hypertension: Secondary | ICD-10-CM

## 2017-09-04 MED FILL — FUROSEMIDE 20 MG TABLET: 20 | 30 days supply | Qty: 30 | Fill #0

## 2017-09-06 ENCOUNTER — Other Ambulatory Visit: Payer: Self-pay | Admitting: Internal Medicine

## 2017-09-06 DIAGNOSIS — I10 Essential (primary) hypertension: Secondary | ICD-10-CM

## 2017-09-07 MED FILL — AMLODIPINE BESYLATE 10 MG T: 10 | 30 days supply | Qty: 30 | Fill #0

## 2017-09-19 MED FILL — TERBINAFINE HCL 250 MG TAB: 250 | 30 days supply | Qty: 30 | Fill #3

## 2017-09-19 MED FILL — FENOFIBRATE 145 MG TABLET: 145 | 30 days supply | Qty: 30 | Fill #0

## 2017-09-20 ENCOUNTER — Encounter: Payer: Self-pay | Admitting: Internal Medicine

## 2017-10-03 ENCOUNTER — Other Ambulatory Visit: Payer: Self-pay | Admitting: Family Medicine

## 2017-10-03 ENCOUNTER — Other Ambulatory Visit: Payer: Self-pay | Admitting: Internal Medicine

## 2017-10-03 DIAGNOSIS — K219 Gastro-esophageal reflux disease without esophagitis: Secondary | ICD-10-CM

## 2017-10-03 DIAGNOSIS — I10 Essential (primary) hypertension: Secondary | ICD-10-CM

## 2017-10-03 MED FILL — metFORMIN HCL 500 MG TABS: 500 | 30 days supply | Qty: 60 | Fill #4

## 2017-10-03 MED FILL — ACCU-CHEK AVIVA PLUS TEST S: 30 days supply | Qty: 100 | Fill #1

## 2017-10-03 MED FILL — CARVEDILOL 3.125 MG TABLET: 3.125 | 30 days supply | Qty: 60 | Fill #0

## 2017-10-03 MED FILL — FAMOTIDINE 20 MG TABLET: 20 | 30 days supply | Qty: 60 | Fill #0

## 2017-10-03 MED FILL — FUROSEMIDE 20 MG TABLET: 20 | 30 days supply | Qty: 30 | Fill #0

## 2017-10-03 MED FILL — GABAPENTIN 300 MG CAPSULE: 300 | 30 days supply | Qty: 90 | Fill #2

## 2017-10-03 MED FILL — AMLODIPINE BESYLATE 10 MG T: 10 | 30 days supply | Qty: 30 | Fill #0

## 2017-10-06 ENCOUNTER — Ambulatory Visit (HOSPITAL_COMMUNITY): Payer: Medicaid Other | Admitting: Nurse Practitioner

## 2017-10-18 MED FILL — TERBINAFINE HCL 250 MG TABS: 250 | 30 days supply | Qty: 30 | Fill #4

## 2017-10-18 MED FILL — FERROUS SULFATE 325 MG TAB: 325 (65 FE) | 30 days supply | Qty: 90 | Fill #3

## 2017-10-19 ENCOUNTER — Ambulatory Visit (HOSPITAL_COMMUNITY)
Admission: RE | Admit: 2017-10-19 | Discharge: 2017-10-19 | Disposition: A | Discharge status: Home / Self Care | Payer: Medicaid Other | Source: Ambulatory Visit | Attending: Nurse Practitioner | Admitting: Nurse Practitioner

## 2017-10-19 VITALS — BP 118/72 | HR 68 | Ht 67.0 in | Wt 194.0 lb

## 2017-10-19 DIAGNOSIS — I10 Essential (primary) hypertension: Secondary | ICD-10-CM | POA: Diagnosis not present

## 2017-10-19 DIAGNOSIS — I480 Paroxysmal atrial fibrillation: Secondary | ICD-10-CM

## 2017-10-19 DIAGNOSIS — E119 Type 2 diabetes mellitus without complications: Secondary | ICD-10-CM | POA: Diagnosis not present

## 2017-10-19 DIAGNOSIS — Z79899 Other long term (current) drug therapy: Secondary | ICD-10-CM | POA: Diagnosis not present

## 2017-10-19 DIAGNOSIS — Z7984 Long term (current) use of oral hypoglycemic drugs: Secondary | ICD-10-CM | POA: Insufficient documentation

## 2017-10-19 DIAGNOSIS — Z833 Family history of diabetes mellitus: Secondary | ICD-10-CM | POA: Diagnosis not present

## 2017-10-19 DIAGNOSIS — Z7901 Long term (current) use of anticoagulants: Secondary | ICD-10-CM | POA: Insufficient documentation

## 2017-10-19 DIAGNOSIS — Z8249 Family history of ischemic heart disease and other diseases of the circulatory system: Secondary | ICD-10-CM | POA: Diagnosis not present

## 2017-10-19 DIAGNOSIS — Z823 Family history of stroke: Secondary | ICD-10-CM | POA: Insufficient documentation

## 2017-10-19 NOTE — Progress Notes (Signed)
Patient ID: Jane Arellano, female   DOB: Aug 11, 1961, 56 y.o.   MRN: [id number]     Primary Care Physician: Tresa Garter, MD Referring Physician: Dr. Gwendel Hanson is a 56 y.o. female with a h/o PAF in the afib clinic for f/u. Dx'ed 6/14, asymtpomatic, picked up on physical exam. Apparently very little afib burden since then. She reports that she feels well . Her health has been stable. She has not noticed any reoccurring afib. Continues on DOAC, carvedilol. No bleeding issues.  Today, she denies symptoms of palpitations, chest pain, shortness of breath, orthopnea, PND, lower extremity edema, dizziness, presyncope, syncope, or neurologic sequela. The patient is tolerating medications without difficulties and is otherwise without complaint today.   Past Medical History:  Diagnosis Date  . Atypical chest pain    a. 11/2005 Negative Myoview  . Diastolic dysfunction   . DM2 (diabetes mellitus, type 2) ([place])   . Dysrhythmia    AFIB HX CV   . GERD (gastroesophageal reflux disease)   . Heart murmur   . History of substance abuse   . HTN (hypertension)   . Iron deficiency anemia   . Paroxysmal atrial fibrillation ([place]) 05/01/2013   On Xarelto  . Subaortic membrane    a. 01/2010 Echo: EF 55-60%, No rwma, subaortic membrane with elevated LVOT mean gradient of 21 mmHg, Triv AI, mod dil LA, mildly increased PASP. b. 05/02/2013 Echo:  LVEF 15-72%, grade 1 diastolic dysfunction, mild LVH, subaortic stenosis w/ turbulation in LVOT c/w subaortic membrane (mean gradient 42 mmHg/peak gradient 81 mmHg), mild biatrial enlargement   Past Surgical History:  Procedure Laterality Date  . CARDIOVERSION N/A 11/04/2013   Procedure: CARDIOVERSION;  Surgeon: Peter M Martinique, MD;  Location: [facility] ENDOSCOPY;  Service: Cardiovascular;  Laterality: N/A;  . CESAREAN SECTION      Current Outpatient Medications  Medication Sig Dispense Refill  . ACCU-CHEK AVIVA PLUS test strip USE AS INSTRUCTED 100  each 12  . ACCU-CHEK SOFTCLIX LANCETS lancets Use as instructed 100 each 5  . acetaminophen-codeine (TYLENOL #3) 300-30 MG tablet Take 1 tablet by mouth every 4 (four) hours as needed. 30 tablet 0  . amitriptyline (ELAVIL) 50 MG tablet Take 1 tablet (50 mg total) by mouth at bedtime. 30 tablet 3  . amLODipine (NORVASC) 10 MG tablet TAKE 1 TABLET BY MOUTH DAILY 30 tablet 0  . Blood Glucose Calibration (ACCU-CHEK AVIVA) SOLN USE AS INSTRUCTED 1 each 0  . Blood Glucose Monitoring Suppl (ACCU-CHEK AVIVA PLUS) w/Device KIT USE AS DIRECTED TWICE DAILY AT 8 AM AND 10 PM 1 kit 0  . Calcium Carb-Cholecalciferol (CALCIUM-VITAMIN D3) 500-400 MG-UNIT TABS Take 1 tablet by mouth daily.    . carvedilol (COREG) 3.125 MG tablet TAKE 1 TABLET BY MOUTH 2 TIMES DAILY. 60 tablet 0  . famotidine (PEPCID) 20 MG tablet TAKE 1 TABLET BY MOUTH 2 TIMES DAILY. 60 tablet 0  . fenofibrate (TRICOR) 145 MG tablet Take 1 tablet (145 mg total) by mouth daily. 30 tablet 3  . ferrous sulfate 325 (65 FE) MG tablet Take 1 tablet (325 mg total) by mouth 3 (three) times daily with meals. 90 tablet 3  . fish oil-omega-3 fatty acids 1000 MG capsule Take 1 g by mouth daily.    . furosemide (LASIX) 20 MG tablet TAKE 1 TABLET BY MOUTH DAILY. 30 tablet 0  . gabapentin (NEURONTIN) 300 MG capsule Take 1 capsule (300 mg total) by mouth 3 (three) times daily.  90 capsule 3  . GAVILAX powder MIX 1 CAPFUL WITH LIQUID AND DRINK ONCE DAILY 476 g 2  . HYDROcodone-acetaminophen (HYCET) 7.5-325 mg/15 ml solution Take 10 mLs by mouth 4 (four) times daily as needed for moderate pain. 120 mL 0  . hydrOXYzine (ATARAX/VISTARIL) 25 MG tablet Take 1 tablet (25 mg total) by mouth 3 (three) times daily as needed for anxiety. 60 tablet 2  . metFORMIN (GLUCOPHAGE) 500 MG tablet TAKE 1 TABLET BY MOUTH 2 TIMES DAILY WITH A MEAL. 180 tablet 3  . Multiple Vitamin (MULTIVITAMIN WITH MINERALS) TABS tablet Take 1 tablet by mouth daily.    Marland Kitchen PRADAXA 150 MG CAPS capsule  TAKE 1 CAPSULE BY MOUTH TWICE DAILY. 180 capsule 1  . terbinafine (LAMISIL) 250 MG tablet TAKE 1 TABLET BY MOUTH DAILY. 90 tablet 1   No current facility-administered medications for this encounter.     Allergies  Allergen Reactions  . Xarelto [Rivaroxaban] Swelling    Gums bled, headaches  . Ketorolac Tromethamine Other (See Comments)    Unknown reaction    Social History   Socioeconomic History  . Marital status: Single    Spouse name: Not on file  . Number of children: Not on file  . Years of education: Not on file  . Highest education level: Not on file  Social Needs  . Financial resource strain: Not on file  . Food insecurity - worry: Not on file  . Food insecurity - inability: Not on file  . Transportation needs - medical: Not on file  . Transportation needs - non-medical: Not on file  Occupational History  . Not on file  Tobacco Use  . Smoking status: Never Smoker  . Smokeless tobacco: Current User    Types: Snuff  Substance and Sexual Activity  . Alcohol use: No    Comment: Drinks 3 - 40 oz beers daily.  i QUIT DOING THAT "  . Drug use: No    Comment: Smokes marijuana weekly.  Has not used cocaine in 9 years.  . Sexual activity: Yes    Birth control/protection: None  Other Topics Concern  . Not on file  Social History Narrative   Lives in [place] by herself.  She had been caring for her mother but she died 2 mos ago.  She tries to remain active but does not regularly exercise.    Family History  Problem Relation Age of Onset  . Diabetes Father        deceased  . Cancer Mother 66       colon  . Stroke Mother   . Hypertension Sister        alive and well  . Hypertension Sister        alive and well  . Heart attack Brother        deceased @ 64    ROS- All systems are reviewed and negative except as per the HPI above  Physical Exam: Vitals:   10/19/17 1031  BP: 118/72  Pulse: 68  Weight: 194 lb (88 kg)  Height: '5\' 7"'$  (1.702 m)    GEN- The  patient is well appearing, alert and oriented x 3 today.   Head- normocephalic, atraumatic Eyes-  Sclera clear, conjunctiva pink Ears- hearing intact Oropharynx- clear Neck- supple, no JVP Lymph- no cervical lymphadenopathy Lungs- Clear to ausculation bilaterally, normal work of breathing Heart- Regular rate and rhythm, 2/6 systolic murmiur murmurs, rubs or gallops, PMI not laterally displaced GI- soft, NT, ND, +  BS Extremities- no clubbing, cyanosis, or edema MS- no significant deformity or atrophy Skin- no rash or lesion Psych- euthymic mood, full affect Neuro- strength and sensation are intact  EKG-SR with sinus arrhythmia at 68 bpm, pr int 150 ms, qrs int 86 ms, qtc 461 ma  Epic records reviewed  Assessment and Plan: 1. PAF Continues in SR Continue carvedilol Continue pradaxa for chadsvasc of at least 3, last bmet in May with a creatinie of 0.75 mg/dl Pending physical labs in December  2. Htn Stable  F/u in afib clinic in 9 months  Butch Penny C. Shirleyann Montero, [facility] [address] [place], [place] [address] [phone]

## 2017-10-23 ENCOUNTER — Other Ambulatory Visit: Payer: Self-pay | Admitting: Internal Medicine

## 2017-10-23 MED FILL — PRADAXA 150 MG CAP: 150 | 30 days supply | Qty: 60 | Fill #1

## 2017-10-23 MED FILL — FENOFIBRATE 145 MG TABLET: 145 | 30 days supply | Qty: 30 | Fill #0

## 2017-10-25 ENCOUNTER — Other Ambulatory Visit (HOSPITAL_COMMUNITY)
Admission: RE | Admit: 2017-10-25 | Discharge: 2017-10-25 | Disposition: A | Discharge status: Home / Self Care | Payer: Medicaid Other | Source: Ambulatory Visit | Attending: Internal Medicine | Admitting: Internal Medicine

## 2017-10-25 ENCOUNTER — Encounter: Payer: Self-pay | Admitting: Internal Medicine

## 2017-10-25 ENCOUNTER — Ambulatory Visit: Payer: Medicaid Other | Attending: Internal Medicine | Admitting: Internal Medicine

## 2017-10-25 VITALS — BP 110/69 | HR 73 | Temp 98.2°F | Resp 18 | Ht 67.0 in | Wt 192.0 lb

## 2017-10-25 DIAGNOSIS — E118 Type 2 diabetes mellitus with unspecified complications: Secondary | ICD-10-CM | POA: Diagnosis not present

## 2017-10-25 DIAGNOSIS — Z79899 Other long term (current) drug therapy: Secondary | ICD-10-CM | POA: Diagnosis not present

## 2017-10-25 DIAGNOSIS — E785 Hyperlipidemia, unspecified: Secondary | ICD-10-CM | POA: Diagnosis not present

## 2017-10-25 DIAGNOSIS — Z0001 Encounter for general adult medical examination with abnormal findings: Secondary | ICD-10-CM | POA: Insufficient documentation

## 2017-10-25 DIAGNOSIS — F418 Other specified anxiety disorders: Secondary | ICD-10-CM | POA: Diagnosis not present

## 2017-10-25 DIAGNOSIS — Z7984 Long term (current) use of oral hypoglycemic drugs: Secondary | ICD-10-CM | POA: Diagnosis not present

## 2017-10-25 DIAGNOSIS — I10 Essential (primary) hypertension: Secondary | ICD-10-CM

## 2017-10-25 DIAGNOSIS — K219 Gastro-esophageal reflux disease without esophagitis: Secondary | ICD-10-CM | POA: Insufficient documentation

## 2017-10-25 DIAGNOSIS — Z124 Encounter for screening for malignant neoplasm of cervix: Secondary | ICD-10-CM | POA: Insufficient documentation

## 2017-10-25 DIAGNOSIS — E1142 Type 2 diabetes mellitus with diabetic polyneuropathy: Secondary | ICD-10-CM | POA: Diagnosis not present

## 2017-10-25 LAB — GLUCOSE, POCT (MANUAL RESULT ENTRY): POC Glucose: 147 mg/dl — AB (ref 70–99)

## 2017-10-25 LAB — POCT GLYCOSYLATED HEMOGLOBIN (HGB A1C): HEMOGLOBIN A1C: 7

## 2017-10-25 MED ORDER — METFORMIN HCL 500 MG PO TABS
ORAL_TABLET | ORAL | 3 refills | Status: DC
Start: 2017-10-25 — End: 2018-04-11

## 2017-10-25 MED ORDER — FENOFIBRATE 145 MG PO TABS: 145.0000 mg | ORAL_TABLET | Freq: Every day | ORAL | 3 refills | Status: DC

## 2017-10-25 MED ORDER — FERROUS SULFATE 325 (65 FE) MG PO TABS: 325.0000 mg | ORAL_TABLET | Freq: Three times a day (TID) | ORAL | 3 refills | Status: DC

## 2017-10-25 MED ORDER — HYDROXYZINE HCL 25 MG PO TABS: 25.0000 mg | ORAL_TABLET | Freq: Three times a day (TID) | ORAL | 2 refills | Status: DC | PRN

## 2017-10-25 MED ORDER — GABAPENTIN 300 MG PO CAPS: 300.0000 mg | ORAL_CAPSULE | Freq: Three times a day (TID) | ORAL | 3 refills | Status: DC

## 2017-10-25 MED ORDER — CARVEDILOL 3.125 MG PO TABS: 3.1250 mg | ORAL_TABLET | Freq: Two times a day (BID) | ORAL | 3 refills | Status: DC

## 2017-10-25 MED ORDER — DABIGATRAN ETEXILATE MESYLATE 150 MG PO CAPS: 150.0000 mg | ORAL_CAPSULE | Freq: Two times a day (BID) | ORAL | 1 refills | Status: DC

## 2017-10-25 MED ORDER — AMLODIPINE BESYLATE 10 MG PO TABS: 10.0000 mg | ORAL_TABLET | Freq: Every day | ORAL | 3 refills | Status: DC

## 2017-10-25 MED ORDER — AMITRIPTYLINE HCL 50 MG PO TABS: 50.0000 mg | ORAL_TABLET | Freq: Every day | ORAL | 3 refills | Status: DC

## 2017-10-25 MED FILL — CARVEDILOL 3.125 MG TABLET: 3.125 | 60 days supply | Qty: 60 | Fill #0

## 2017-10-25 MED FILL — AMLODIPINE BESYLATE 10 MG T: 10 | 30 days supply | Qty: 30 | Fill #0

## 2017-10-25 MED FILL — AMITRIPTYLINE HCL 50 MG TAB: 50 | 30 days supply | Qty: 30 | Fill #0

## 2017-10-25 MED FILL — hydrOXYzine HCL 25 MG TABS: 25 | 20 days supply | Qty: 60 | Fill #0

## 2017-10-25 MED FILL — metFORMIN HCL 500 MG TABS: 500 | 30 days supply | Qty: 60 | Fill #0

## 2017-10-25 NOTE — Progress Notes (Signed)
Jane Arellano, is a 56 y.o. female  XTK:[id number]  DJM:[id number]  DOB - Mar 27, 1961  Chief Complaint  Patient presents with  . Annual Exam      Subjective:   Jane Arellano is a 56 y.o. female with history of paroxysmal atrial fibrillation on Pradaxa and carvedilol, hypertension, type 2 diabetes mellitus, diastolic heart dysfunction, subaortic membrane status post resection in 2015 who presents here today for a routine follow up visit, medication refills and Pap smear. Patient has no new symptoms today. She has ongoing pain related to her diabetic neuropathy. Patient denies worsening depression, she denies any suicidal ideations or thoughts. She needs her medications refilled. She currently not sexually active, no vaginal discharge or unusual odor. Patient has No headache, No chest pain, No abdominal pain - No Nausea, No new weakness tingling or numbness, No Cough - SOB.  Problem  Dyslipidemia  Pap Smear for Cervical Cancer Screening    ALLERGIES: Allergies  Allergen Reactions  . Xarelto [Rivaroxaban] Swelling    Gums bled, headaches  . Ketorolac Tromethamine Other (See Comments)    Unknown reaction    PAST MEDICAL HISTORY: Past Medical History:  Diagnosis Date  . Atypical chest pain    a. 11/2005 Negative Myoview  . Diastolic dysfunction   . DM2 (diabetes mellitus, type 2) ([place])   . Dysrhythmia    AFIB HX CV   . GERD (gastroesophageal reflux disease)   . Heart murmur   . History of substance abuse   . HTN (hypertension)   . Iron deficiency anemia   . Paroxysmal atrial fibrillation ([place]) 05/01/2013   On Xarelto  . Subaortic membrane    a. 01/2010 Echo: EF 55-60%, No rwma, subaortic membrane with elevated LVOT mean gradient of 21 mmHg, Triv AI, mod dil LA, mildly increased PASP. b. 05/02/2013 Echo:  LVEF 22-29%, grade 1 diastolic dysfunction, mild LVH, subaortic stenosis w/ turbulation in LVOT c/w subaortic membrane (mean gradient 42 mmHg/peak gradient 81 mmHg), mild  biatrial enlargement    MEDICATIONS AT HOME: Prior to Admission medications   Medication Sig Start Date End Date Taking? Authorizing Provider  ACCU-CHEK AVIVA PLUS test strip USE AS INSTRUCTED 03/06/17  Yes Tresa Garter, MD  ACCU-CHEK SOFTCLIX LANCETS lancets Use as instructed 01/04/16  Yes Rafaella Kole E, MD  acetaminophen-codeine (TYLENOL #3) 300-30 MG tablet Take 1 tablet by mouth every 4 (four) hours as needed. 04/26/17  Yes Tresa Garter, MD  amitriptyline (ELAVIL) 50 MG tablet Take 1 tablet (50 mg total) by mouth at bedtime. 10/25/17  Yes Tresa Garter, MD  amLODipine (NORVASC) 10 MG tablet Take 1 tablet (10 mg total) by mouth daily. 10/25/17  Yes Tresa Garter, MD  Blood Glucose Calibration (ACCU-CHEK AVIVA) SOLN USE AS INSTRUCTED 01/11/16  Yes Shanequa Whitenight E, MD  Blood Glucose Monitoring Suppl (ACCU-CHEK AVIVA PLUS) w/Device KIT USE AS DIRECTED TWICE DAILY AT 8 AM AND 10 PM 01/04/16  Yes Abrish Erny E, MD  Calcium Carb-Cholecalciferol (CALCIUM-VITAMIN D3) 500-400 MG-UNIT TABS Take 1 tablet by mouth daily.   Yes [provider]  carvedilol (COREG) 3.125 MG tablet Take 1 tablet (3.125 mg total) by mouth 2 (two) times daily. 10/25/17  Yes Tresa Garter, MD  dabigatran (PRADAXA) 150 MG CAPS capsule Take 1 capsule (150 mg total) by mouth 2 (two) times daily. 10/25/17  Yes Shanise Balch E, MD  famotidine (PEPCID) 20 MG tablet TAKE 1 TABLET BY MOUTH 2 TIMES DAILY. 10/03/17  Yes Sharika Mosquera E,  MD  fenofibrate (TRICOR) 145 MG tablet Take 1 tablet (145 mg total) by mouth daily. 10/25/17  Yes Tresa Garter, MD  ferrous sulfate 325 (65 FE) MG tablet Take 1 tablet (325 mg total) by mouth 3 (three) times daily with meals. 10/25/17  Yes Tresa Garter, MD  fish oil-omega-3 fatty acids 1000 MG capsule Take 1 g by mouth daily.   Yes [provider]  furosemide (LASIX) 20 MG tablet TAKE 1 TABLET BY MOUTH DAILY. 10/03/17   Yes Tresa Garter, MD  gabapentin (NEURONTIN) 300 MG capsule Take 1 capsule (300 mg total) by mouth 3 (three) times daily. 10/25/17  Yes Tresa Garter, MD  GAVILAX powder MIX 1 CAPFUL WITH LIQUID AND DRINK ONCE DAILY 02/05/16  Yes Chari Manning A, NP  hydrOXYzine (ATARAX/VISTARIL) 25 MG tablet Take 1 tablet (25 mg total) by mouth 3 (three) times daily as needed for anxiety. 10/25/17  Yes Taseen Marasigan E, MD  metFORMIN (GLUCOPHAGE) 500 MG tablet TAKE 1 TABLET BY MOUTH 2 TIMES DAILY WITH A MEAL. 10/25/17  Yes Tresa Garter, MD  Multiple Vitamin (MULTIVITAMIN WITH MINERALS) TABS tablet Take 1 tablet by mouth daily.   Yes [provider]  terbinafine (LAMISIL) 250 MG tablet TAKE 1 TABLET BY MOUTH DAILY. 06/01/17  Yes Tresa Garter, MD    Objective:   Vitals:   10/25/17 1155  BP: 110/69  Pulse: 73  Resp: 18  Temp: 98.2 F (36.8 C)  TempSrc: Oral  SpO2: 100%  Weight: 192 lb (87.1 kg)  Height: '5\' 7"'$  (1.702 m)   Exam General appearance : Awake, alert, not in any distress. Speech Clear. Not toxic looking HEENT: Atraumatic and Normocephalic, pupils equally reactive to light and accomodation Neck: Supple, no JVD. No cervical lymphadenopathy.  Chest: Good air entry bilaterally, no added sounds  CVS: S1 S2 regular, no murmurs.  Abdomen: Bowel sounds present, Non tender and not distended with no gaurding, rigidity or rebound. Extremities: B/L Lower Ext shows no edema, both legs are warm to touch Neurology: Awake alert, and oriented X 3, CN II-XII intact, Non focal Pelvic Exam: Cervix normal in appearance, external genitalia normal, no adnexal masses or tenderness, no cervical motion tenderness, rectovaginal septum normal, uterus normal size, shape, and consistency and vagina normal without discharge   Data Review Lab Results  Component Value Date   HGBA1C 7.0 10/25/2017   HGBA1C 7.1 04/26/2017   HGBA1C 7.1 08/18/2016    Assessment & Plan   1. Type 2  diabetes mellitus with complication, without long-term current use of insulin (HCC)  - HgB A1c is 7.0% today. - Glucose (CBG) - CMP14+EGFR  Refill - gabapentin (NEURONTIN) 300 MG capsule; Take 1 capsule (300 mg total) by mouth 3 (three) times daily.  Dispense: 90 capsule; Refill: 3 - metFORMIN (GLUCOPHAGE) 500 MG tablet; TAKE 1 TABLET BY MOUTH 2 TIMES DAILY WITH A MEAL.  Dispense: 180 tablet; Refill: 3  2. Essential hypertension  - CBC with Differential/Platelet - TSH - Urinalysis, Complete - VITAMIN D 25 Hydroxy (Vit-D Deficiency, Fractures) - amLODipine (NORVASC) 10 MG tablet; Take 1 tablet (10 mg total) by mouth daily.  Dispense: 90 tablet; Refill: 3 - carvedilol (COREG) 3.125 MG tablet; Take 1 tablet (3.125 mg total) by mouth 2 (two) times daily.  Dispense: 60 tablet; Refill: 3  3. Dyslipidemia  - Lipid panel  4. Pap smear for cervical cancer screening  - Cytology - PAP [place]  5. Diabetic polyneuropathy associated with  type 2 diabetes mellitus (HCC)  - amitriptyline (ELAVIL) 50 MG tablet; Take 1 tablet (50 mg total) by mouth at bedtime.  Dispense: 30 tablet; Refill: 3 - gabapentin (NEURONTIN) 300 MG capsule; Take 1 capsule (300 mg total) by mouth 3 (three) times daily.  Dispense: 90 capsule; Refill: 3  6. Gastroesophageal reflux disease without esophagitis  - ferrous sulfate 325 (65 FE) MG tablet; Take 1 tablet (325 mg total) by mouth 3 (three) times daily with meals.  Dispense: 90 tablet; Refill: 3  7. Depression with anxiety  - hydrOXYzine (ATARAX/VISTARIL) 25 MG tablet; Take 1 tablet (25 mg total) by mouth 3 (three) times daily as needed for anxiety.  Dispense: 60 tablet; Refill: 2  Patient have been counseled extensively about nutrition and exercise. Other issues discussed during this visit include: low cholesterol diet, weight control and daily exercise, foot care, annual eye examinations at Ophthalmology, importance of adherence with medications and regular  follow-up. We also discussed long term complications of uncontrolled diabetes and hypertension.   Return in about 6 months (around 04/25/2018) for Hemoglobin A1C and Follow up, DM, Follow up HTN.  The patient was given clear instructions to go to ER or return to medical center if symptoms don't improve, worsen or new problems develop. The patient verbalized understanding. The patient was told to call to get lab results if they haven't heard anything in the next week.   This note has been created with Surveyor, quantity. Any transcriptional errors are unintentional.    Angelica Chessman, MD, MHA, Karilyn Cota, [facility], [place]   10/25/2017, 12:36 PM

## 2017-10-25 NOTE — Patient Instructions (Signed)
Diabetes Mellitus and Food It is important for you to manage your blood sugar (glucose) level. Your blood glucose level can be greatly affected by what you eat. Eating healthier foods in the appropriate amounts throughout the day at about the same time each day will help you control your blood glucose level. It can also help slow or prevent worsening of your diabetes mellitus. Healthy eating may even help you improve the level of your blood pressure and reach or maintain a healthy weight. General recommendations for healthful eating and cooking habits include:  Eating meals and snacks regularly. Avoid going long periods of time without eating to lose weight.  Eating a diet that consists mainly of plant-based foods, such as fruits, vegetables, nuts, legumes, and whole grains.  Using low-heat cooking methods, such as baking, instead of high-heat cooking methods, such as deep frying.  Work with your dietitian to make sure you understand how to use the Nutrition Facts information on food labels. How can food affect me? Carbohydrates Carbohydrates affect your blood glucose level more than any other type of food. Your dietitian will help you determine how many carbohydrates to eat at each meal and teach you how to count carbohydrates. Counting carbohydrates is important to keep your blood glucose at a healthy level, especially if you are using insulin or taking certain medicines for diabetes mellitus. Alcohol Alcohol can cause sudden decreases in blood glucose (hypoglycemia), especially if you use insulin or take certain medicines for diabetes mellitus. Hypoglycemia can be a life-threatening condition. Symptoms of hypoglycemia (sleepiness, dizziness, and disorientation) are similar to symptoms of having too much alcohol. If your health care provider has given you approval to drink alcohol, do so in moderation and use the following guidelines:  Women should not have more than one drink per day, and men  should not have more than two drinks per day. One drink is equal to: ? 12 oz of beer. ? 5 oz of wine. ? 1 oz of hard liquor.  Do not drink on an empty stomach.  Keep yourself hydrated. Have water, diet soda, or unsweetened iced tea.  Regular soda, juice, and other mixers might contain a lot of carbohydrates and should be counted.  What foods are not recommended? As you make food choices, it is important to remember that all foods are not the same. Some foods have fewer nutrients per serving than other foods, even though they might have the same number of calories or carbohydrates. It is difficult to get your body what it needs when you eat foods with fewer nutrients. Examples of foods that you should avoid that are high in calories and carbohydrates but low in nutrients include:  Trans fats (most processed foods list trans fats on the Nutrition Facts label).  Regular soda.  Juice.  Candy.  Sweets, such as cake, pie, doughnuts, and cookies.  Fried foods.  What foods can I eat? Eat nutrient-rich foods, which will nourish your body and keep you healthy. The food you should eat also will depend on several factors, including:  The calories you need.  The medicines you take.  Your weight.  Your blood glucose level.  Your blood pressure level.  Your cholesterol level.  You should eat a variety of foods, including:  Protein. ? Lean cuts of meat. ? Proteins low in saturated fats, such as fish, egg whites, and beans. Avoid processed meats.  Fruits and vegetables. ? Fruits and vegetables that may help control blood glucose levels, such as apples,   mangoes, and yams.  Dairy products. ? Choose fat-free or low-fat dairy products, such as milk, yogurt, and cheese.  Grains, bread, pasta, and rice. ? Choose whole grain products, such as multigrain bread, whole oats, and brown rice. These foods may help control blood pressure.  Fats. ? Foods containing healthful fats, such as  nuts, avocado, olive oil, canola oil, and fish.  Does everyone with diabetes mellitus have the same meal plan? Because every person with diabetes mellitus is different, there is not one meal plan that works for everyone. It is very important that you meet with a dietitian who will help you create a meal plan that is just right for you. This information is not intended to replace advice given to you by your health care provider. Make sure you discuss any questions you have with your health care provider. Document Released: 08/04/2005 Document Revised: 04/14/2016 Document Reviewed: 10/04/2013 Elsevier Interactive Patient Education  2017 Else[employer] Inc[employer] Diabetes and Foot Care Diabetes may cause you to have problems because of poor blood supply (circulation) to your feet and legs. This may cause the skin on your feet to become thinner, break easier, and heal more slowly. Your skin may become dry, and the skin may peel and crack. You may also have nerve damage in your legs and feet causing decreased feeling in them. You may not notice minor injuries to your feet that could lead to infections or more serious problems. Taking care of your feet is one of the most important things you can do for yourself. Follow these instructions at home:  Wear shoes at all times, even in the house. Do not go barefoot. Bare feet are easily injured.  Check your feet daily for blisters, cuts, and redness. If you cannot see the bottom of your feet, use a mirror or ask someone for help.  Wash your feet with warm water (do not use hot water) and mild soap. Then pat your feet and the areas between your toes until they are completely dry. Do not soak your feet as this can dry your skin.  Apply a moisturizing lotion or petroleum jelly (that does not contain alcohol and is unscented) to the skin on your feet and to dry, brittle toenails. Do not apply lotion between your toes.  Trim your toenails straight across. Do not dig under  them or around the cuticle. File the edges of your nails with an emery board or nail file.  Do not cut corns or calluses or try to remove them with medicine.  Wear clean socks or stockings every day. Make sure they are not too tight. Do not wear knee-high stockings since they may decrease blood flow to your legs.  Wear shoes that fit properly and have enough cushioning. To break in new shoes, wear them for just a few hours a day. This prevents you from injuring your feet. Always look in your shoes before you put them on to be sure there are no objects inside.  Do not cross your legs. This may decrease the blood flow to your feet.  If you find a minor scrape, cut, or break in the skin on your feet, keep it and the skin around it clean and dry. These areas may be cleansed with mild soap and water. Do not cleanse the area with peroxide, alcohol, or iodine.  When you remove an adhesive bandage, be sure not to damage the skin around it.  If you have a wound, look at it several times   a day to make sure it is healing.  Do not use heating pads or hot water bottles. They may burn your skin. If you have lost feeling in your feet or legs, you may not know it is happening until it is too late.  Make sure your health care provider performs a complete foot exam at least annually or more often if you have foot problems. Report any cuts, sores, or bruises to your health care provider immediately. Contact a health care provider if:  You have an injury that is not healing.  You have cuts or breaks in the skin.  You have an ingrown nail.  You notice redness on your legs or feet.  You feel burning or tingling in your legs or feet.  You have pain or cramps in your legs and feet.  Your legs or feet are numb.  Your feet always feel cold. Get help right away if:  There is increasing redness, swelling, or pain in or around a wound.  There is a red line that goes up your leg.  Pus is coming from a  wound.  You develop a fever or as directed by your health care provider.  You notice a bad smell coming from an ulcer or wound. This information is not intended to replace advice given to you by your health care provider. Make sure you discuss any questions you have with your health care provider. Document Released: 11/04/2000 Document Revised: 04/14/2016 Document Reviewed: 04/16/2013 Elsevier Interactive Patient Education  2017 [employer] Inc. Diabetes Mellitus and Exercise Exercising regularly is important for your overall health, especially when you have diabetes (diabetes mellitus). Exercising is not only about losing weight. It has many health benefits, such as increasing muscle strength and bone density and reducing body fat and stress. This leads to improved fitness, flexibility, and endurance, all of which result in better overall health. Exercise has additional benefits for people with diabetes, including:  Reducing appetite.  Helping to lower and control blood glucose.  Lowering blood pressure.  Helping to control amounts of fatty substances (lipids) in the blood, such as cholesterol and triglycerides.  Helping the body to respond better to insulin (improving insulin sensitivity).  Reducing how much insulin the body needs.  Decreasing the risk for heart disease by: ? Lowering cholesterol and triglyceride levels. ? Increasing the levels of good cholesterol. ? Lowering blood glucose levels.  What is my activity plan? Your health care provider or certified diabetes educator can help you make a plan for the type and frequency of exercise (activity plan) that works for you. Make sure that you:  Do at least 150 minutes of moderate-intensity or vigorous-intensity exercise each week. This could be brisk walking, biking, or water aerobics. ? Do stretching and strength exercises, such as yoga or weightlifting, at least 2 times a week. ? Spread out your activity over at least 3 days  of the week.  Get some form of physical activity every day. ? Do not go more than 2 days in a row without some kind of physical activity. ? Avoid being inactive for more than 90 minutes at a time. Take frequent breaks to walk or stretch.  Choose a type of exercise or activity that you enjoy, and set realistic goals.  Start slowly, and gradually increase the intensity of your exercise over time.  What do I need to know about managing my diabetes?  Check your blood glucose before and after exercising. ? If your blood glucose is higher   than 240 mg/dL (13.3 mmol/L) before you exercise, check your urine for ketones. If you have ketones in your urine, do not exercise until your blood glucose returns to normal.  Know the symptoms of low blood glucose (hypoglycemia) and how to treat it. Your risk for hypoglycemia increases during and after exercise. Common symptoms of hypoglycemia can include: ? Hunger. ? Anxiety. ? Sweating and feeling clammy. ? Confusion. ? Dizziness or feeling light-headed. ? Increased heart rate or palpitations. ? Blurry vision. ? Tingling or numbness around the mouth, lips, or tongue. ? Tremors or shakes. ? Irritability.  Keep a rapid-acting carbohydrate snack available before, during, and after exercise to help prevent or treat hypoglycemia.  Avoid injecting insulin into areas of the body that are going to be exercised. For example, avoid injecting insulin into: ? The arms, when playing tennis. ? The legs, when jogging.  Keep records of your exercise habits. Doing this can help you and your health care provider adjust your diabetes management plan as needed. Write down: ? Food that you eat before and after you exercise. ? Blood glucose levels before and after you exercise. ? The type and amount of exercise you have done. ? When your insulin is expected to peak, if you use insulin. Avoid exercising at times when your insulin is peaking.  When you start a new  exercise or activity, work with your health care provider to make sure the activity is safe for you, and to adjust your insulin, medicines, or food intake as needed.  Drink plenty of water while you exercise to prevent dehydration or heat stroke. Drink enough fluid to keep your urine clear or pale yellow. This information is not intended to replace advice given to you by your health care provider. Make sure you discuss any questions you have with your health care provider. Document Released: 01/28/2004 Document Revised: 05/27/2016 Document Reviewed: 04/18/2016 Elsevier Interactive Patient Education  2018 Elsevier Inc.  

## 2017-10-26 LAB — MICROSCOPIC EXAMINATION
CASTS: NONE SEEN /LPF
EPITHELIAL CELLS (NON RENAL): NONE SEEN /HPF (ref 0–10)
RBC, UA: NONE SEEN /hpf (ref 0–?)

## 2017-10-26 LAB — URINALYSIS, COMPLETE
Bilirubin, UA: NEGATIVE
GLUCOSE, UA: NEGATIVE
Ketones, UA: NEGATIVE
Leukocytes, UA: NEGATIVE
Nitrite, UA: NEGATIVE
Protein, UA: NEGATIVE
RBC, UA: NEGATIVE
SPEC GRAV UA: 1.006 (ref 1.005–1.030)
Urobilinogen, Ur: 0.2 mg/dL (ref 0.2–1.0)
pH, UA: 5.5 (ref 5.0–7.5)

## 2017-10-27 LAB — CYTOLOGY - PAP
Candida vaginitis: NEGATIVE
Chlamydia: NEGATIVE
Diagnosis: NEGATIVE
HPV: NOT DETECTED
Neisseria Gonorrhea: NEGATIVE
Trichomonas: NEGATIVE

## 2017-10-31 ENCOUNTER — Other Ambulatory Visit: Payer: Self-pay | Admitting: Internal Medicine

## 2017-10-31 DIAGNOSIS — I10 Essential (primary) hypertension: Secondary | ICD-10-CM

## 2017-11-01 ENCOUNTER — Other Ambulatory Visit: Payer: Medicaid Other

## 2017-11-01 MED FILL — FUROSEMIDE 20 MG TABLET: 20 | 30 days supply | Qty: 30 | Fill #0

## 2017-11-01 NOTE — Telephone Encounter (Signed)
Patient verified DOB Patient is aware of PAP being negative for malignancy or infection and a recommended screening can be completed in 2-3 years. Patient had no further questions.

## 2017-11-01 NOTE — Telephone Encounter (Signed)
-----   Message from Quentin Angstlugbemiga E Jegede, MD sent at 10/28/2017  5:56 PM EST ----- Pap Smear is Negative for malignancy and also negative for infections. Screening in 2 - 3 years

## 2017-11-03 ENCOUNTER — Ambulatory Visit: Payer: Medicaid Other | Attending: Internal Medicine

## 2017-11-03 DIAGNOSIS — I10 Essential (primary) hypertension: Secondary | ICD-10-CM | POA: Diagnosis not present

## 2017-11-03 DIAGNOSIS — E118 Type 2 diabetes mellitus with unspecified complications: Secondary | ICD-10-CM | POA: Diagnosis present

## 2017-11-03 DIAGNOSIS — E785 Hyperlipidemia, unspecified: Secondary | ICD-10-CM | POA: Insufficient documentation

## 2017-11-03 NOTE — Progress Notes (Signed)
Patient here for lab visit only 

## 2017-11-04 LAB — LIPID PANEL
CHOL/HDL RATIO: 2.3 ratio (ref 0.0–4.4)
Cholesterol, Total: 140 mg/dL (ref 100–199)
HDL: 62 mg/dL (ref >39)
LDL Calculated: 47 mg/dL (ref 0–99)
Triglycerides: 155 mg/dL — ABNORMAL HIGH (ref 0–149)
VLDL Cholesterol Cal: 31 mg/dL (ref 5–40)

## 2017-11-04 LAB — CBC WITH DIFFERENTIAL/PLATELET
BASOS: 0 %
Basophils Absolute: 0 10*3/uL (ref 0.0–0.2)
EOS (ABSOLUTE): 0.1 10*3/uL (ref 0.0–0.4)
EOS: 2 %
HEMATOCRIT: 36.2 % (ref 34.0–46.6)
Hemoglobin: 12.4 g/dL (ref 11.1–15.9)
IMMATURE GRANULOCYTES: 0 %
Immature Grans (Abs): 0 10*3/uL (ref 0.0–0.1)
Lymphocytes Absolute: 1.8 10*3/uL (ref 0.7–3.1)
Lymphs: 37 %
MCH: 33.4 pg — AB (ref 26.6–33.0)
MCHC: 34.3 g/dL (ref 31.5–35.7)
MCV: 98 fL — AB (ref 79–97)
Monocytes Absolute: 0.4 10*3/uL (ref 0.1–0.9)
Monocytes: 8 %
NEUTROS ABS: 2.5 10*3/uL (ref 1.4–7.0)
NEUTROS PCT: 53 %
Platelets: 225 10*3/uL (ref 150–379)
RBC: 3.71 x10E6/uL — ABNORMAL LOW (ref 3.77–5.28)
RDW: 13.3 % (ref 12.3–15.4)
WBC: 4.8 10*3/uL (ref 3.4–10.8)

## 2017-11-04 LAB — CMP14+EGFR
A/G RATIO: 1.5 (ref 1.2–2.2)
ALT: 16 IU/L (ref 0–32)
AST: 19 IU/L (ref 0–40)
Albumin: 4.9 g/dL (ref 3.5–5.5)
Alkaline Phosphatase: 48 IU/L (ref 39–117)
BILIRUBIN TOTAL: 0.2 mg/dL (ref 0.0–1.2)
BUN/Creatinine Ratio: 18 (ref 9–23)
BUN: 15 mg/dL (ref 6–24)
CALCIUM: 10.5 mg/dL — AB (ref 8.7–10.2)
CHLORIDE: 105 mmol/L (ref 96–106)
CO2: 24 mmol/L (ref 20–29)
Creatinine, Ser: 0.83 mg/dL (ref 0.57–1.00)
GFR calc non Af Amer: 79 mL/min/{1.73_m2} (ref >59)
GFR, EST AFRICAN AMERICAN: 91 mL/min/{1.73_m2} (ref >59)
GLOBULIN, TOTAL: 3.2 g/dL (ref 1.5–4.5)
Glucose: 131 mg/dL — ABNORMAL HIGH (ref 65–99)
POTASSIUM: 4.6 mmol/L (ref 3.5–5.2)
SODIUM: 144 mmol/L (ref 134–144)
Total Protein: 8.1 g/dL (ref 6.0–8.5)

## 2017-11-04 LAB — VITAMIN D 25 HYDROXY (VIT D DEFICIENCY, FRACTURES): Vit D, 25-Hydroxy: 42.1 ng/mL (ref 30.0–100.0)

## 2017-11-04 LAB — TSH: TSH: 2.63 u[IU]/mL (ref 0.450–4.500)

## 2017-11-10 ENCOUNTER — Telehealth: Payer: Self-pay | Admitting: *Deleted

## 2017-11-10 NOTE — Telephone Encounter (Signed)
Medical Assistant left message on patient's home and cell voicemail. Voicemail states [place] [facility] [place] [phone]. Patient is aware of vitamin d being normal and all other labs being mostly normal.

## 2017-11-10 NOTE — Telephone Encounter (Signed)
-----   Message from Quentin Angstlugbemiga E Jegede, MD sent at 11/07/2017  1:17 PM EST ----- Please inform patient that her lab results are mostly normal. Vit D level is normal.

## 2017-11-16 ENCOUNTER — Other Ambulatory Visit: Payer: Self-pay | Admitting: Internal Medicine

## 2017-11-16 DIAGNOSIS — K219 Gastro-esophageal reflux disease without esophagitis: Secondary | ICD-10-CM

## 2017-11-16 MED FILL — FENOFIBRATE 145 MG TABLET: 145 | 30 days supply | Qty: 30 | Fill #0

## 2017-11-16 MED FILL — GABAPENTIN 300 MG CAPSULE: 300 | 30 days supply | Qty: 90 | Fill #3

## 2017-11-16 MED FILL — TERBINAFINE HCL 250 MG TABS: 250 | 30 days supply | Qty: 30 | Fill #5

## 2017-11-16 MED FILL — FAMOTIDINE 20 MG TABLET: 20 | 30 days supply | Qty: 60 | Fill #0

## 2017-12-01 ENCOUNTER — Other Ambulatory Visit: Payer: Self-pay | Admitting: Internal Medicine

## 2017-12-01 DIAGNOSIS — I10 Essential (primary) hypertension: Secondary | ICD-10-CM

## 2017-12-01 MED FILL — AMLODIPINE BESYLATE 10 MG T: 10 | 30 days supply | Qty: 30 | Fill #1

## 2017-12-04 MED FILL — FUROSEMIDE 20 MG TABLET: 20 | 30 days supply | Qty: 30 | Fill #0

## 2017-12-19 ENCOUNTER — Other Ambulatory Visit: Payer: Self-pay | Admitting: Internal Medicine

## 2017-12-19 DIAGNOSIS — K219 Gastro-esophageal reflux disease without esophagitis: Secondary | ICD-10-CM

## 2017-12-19 MED FILL — PRADAXA 150 MG CAP: 150 | 30 days supply | Qty: 60 | Fill #2

## 2017-12-19 MED FILL — FERROUS SULFATE 325 MG TAB: 325 (65 FE) | 30 days supply | Qty: 90 | Fill #0

## 2017-12-19 MED FILL — FENOFIBRATE 145 MG TABLET: 145 | 30 days supply | Qty: 30 | Fill #1

## 2017-12-19 MED FILL — FAMOTIDINE 20 MG TABS: 20 | 30 days supply | Qty: 60 | Fill #0

## 2017-12-20 MED FILL — TERBINAFINE HCL 250 MG TABS: 250 | 30 days supply | Qty: 30 | Fill #0

## 2018-01-01 ENCOUNTER — Other Ambulatory Visit: Payer: Self-pay | Admitting: Internal Medicine

## 2018-01-01 DIAGNOSIS — I10 Essential (primary) hypertension: Secondary | ICD-10-CM

## 2018-01-01 MED FILL — FUROSEMIDE 20 MG TABLET: 20 | 30 days supply | Qty: 30 | Fill #0

## 2018-01-01 MED FILL — metFORMIN HCL 500 MG TABS: 500 | 30 days supply | Qty: 60 | Fill #1

## 2018-01-01 MED FILL — AMLODIPINE BESYLATE 10 MG T: 10 | 30 days supply | Qty: 30 | Fill #2

## 2018-01-01 MED FILL — CARVEDILOL 3.125 MG TABLET: 3.125 | 60 days supply | Qty: 60 | Fill #1

## 2018-01-01 MED FILL — AMITRIPTYLINE HCL 50 MG TAB: 50 | 30 days supply | Qty: 30 | Fill #1

## 2018-01-17 ENCOUNTER — Other Ambulatory Visit: Payer: Self-pay | Admitting: Internal Medicine

## 2018-01-17 DIAGNOSIS — I10 Essential (primary) hypertension: Secondary | ICD-10-CM

## 2018-01-19 MED FILL — FENOFIBRATE 145 MG TABLET: 145 | 30 days supply | Qty: 30 | Fill #2

## 2018-01-19 MED FILL — hydrOXYzine HCL 25 MG TABS: 25 | 20 days supply | Qty: 60 | Fill #1

## 2018-01-19 MED FILL — TERBINAFINE HCL 250 MG TABS: 250 | 30 days supply | Qty: 30 | Fill #1

## 2018-01-19 MED FILL — PRADAXA 150 MG CAP: 150 | 30 days supply | Qty: 60 | Fill #3

## 2018-01-29 ENCOUNTER — Other Ambulatory Visit: Payer: Self-pay | Admitting: Internal Medicine

## 2018-01-29 DIAGNOSIS — E1142 Type 2 diabetes mellitus with diabetic polyneuropathy: Secondary | ICD-10-CM

## 2018-01-29 DIAGNOSIS — E118 Type 2 diabetes mellitus with unspecified complications: Secondary | ICD-10-CM

## 2018-01-29 DIAGNOSIS — I10 Essential (primary) hypertension: Secondary | ICD-10-CM

## 2018-01-29 MED FILL — FUROSEMIDE 20 MG TABLET: 20 | 30 days supply | Qty: 30 | Fill #0

## 2018-01-29 MED FILL — AMLODIPINE BESYLATE 10 MG T: 10 | 30 days supply | Qty: 30 | Fill #3

## 2018-02-01 MED FILL — GABAPENTIN 300 MG CAPSULE: 300 | 30 days supply | Qty: 90 | Fill #0

## 2018-02-14 ENCOUNTER — Telehealth: Payer: Self-pay | Admitting: Internal Medicine

## 2018-02-14 NOTE — Telephone Encounter (Signed)
Pt called to request a note for her apartment complex stating she needs her dog for company because she is depressed, and he keeps her company and relieves her stress. Please follow up

## 2018-02-15 NOTE — Telephone Encounter (Signed)
Please advise on this letter being constructed.

## 2018-02-16 MED FILL — metFORMIN HCL 500 MG TABS: 500 | 30 days supply | Qty: 60 | Fill #2

## 2018-02-16 MED FILL — FERROUS SULFATE 325 MG TAB: 325 (65 FE) | 30 days supply | Qty: 90 | Fill #1

## 2018-02-16 MED FILL — FENOFIBRATE 145 MG TABLET: 145 | 30 days supply | Qty: 30 | Fill #3

## 2018-02-16 MED FILL — ACCU-CHEK AVIVA PLUS TEST S: 30 days supply | Qty: 100 | Fill #2

## 2018-02-16 MED FILL — TERBINAFINE HCL 250 MG TABS: 250 | 30 days supply | Qty: 30 | Fill #2

## 2018-02-19 ENCOUNTER — Telehealth: Payer: Self-pay | Admitting: Internal Medicine

## 2018-02-19 NOTE — Telephone Encounter (Signed)
Patient called to check up on her emotional support letter to keep her dog. Patient stated she would like a call and or could give more information if needed be. Patient requested for a call once completed.

## 2018-02-23 ENCOUNTER — Other Ambulatory Visit: Payer: Self-pay | Admitting: Internal Medicine

## 2018-02-23 DIAGNOSIS — I10 Essential (primary) hypertension: Secondary | ICD-10-CM

## 2018-02-23 MED FILL — AMLODIPINE BESYLATE 10 MG T: 10 | 30 days supply | Qty: 30 | Fill #4

## 2018-02-23 MED FILL — CARVEDILOL 3.125 MG TABLET: 3.125 | 60 days supply | Qty: 60 | Fill #2

## 2018-02-23 MED FILL — FUROSEMIDE 20 MG TABLET: 20 | 30 days supply | Qty: 30 | Fill #0

## 2018-02-24 NOTE — Telephone Encounter (Signed)
Yes, please draft one for her. Thanks

## 2018-02-26 ENCOUNTER — Encounter: Payer: Self-pay | Admitting: *Deleted

## 2018-02-26 NOTE — Telephone Encounter (Signed)
Patients letter has been drafted and placed at the front desk. MA left a message for the patient informing her.

## 2018-03-12 MED FILL — AMITRIPTYLINE HCL 50 MG TAB: 50 | 30 days supply | Qty: 30 | Fill #2

## 2018-03-12 MED FILL — hydrOXYzine HCL 25 MG TABS: 25 | 20 days supply | Qty: 60 | Fill #2

## 2018-03-12 MED FILL — PRADAXA 150 MG CAP: 150 | 30 days supply | Qty: 60 | Fill #4

## 2018-03-20 MED FILL — FENOFIBRATE 145 MG TABLET: 145 | 30 days supply | Qty: 30 | Fill #4

## 2018-03-20 MED FILL — GABAPENTIN 300 MG CAPSULE: 300 | 30 days supply | Qty: 90 | Fill #1

## 2018-03-20 MED FILL — TERBINAFINE HCL 250 MG TAB: 250 | 30 days supply | Qty: 30 | Fill #3

## 2018-03-20 MED FILL — metFORMIN HCL 500 MG TABS: 500 | 30 days supply | Qty: 60 | Fill #3

## 2018-04-11 ENCOUNTER — Ambulatory Visit: Payer: Medicaid Other | Attending: Family Medicine | Admitting: Physician Assistant

## 2018-04-11 VITALS — BP 141/89 | HR 73 | Temp 99.0°F | Resp 16 | Wt 194.2 lb

## 2018-04-11 DIAGNOSIS — I10 Essential (primary) hypertension: Secondary | ICD-10-CM

## 2018-04-11 DIAGNOSIS — Z7901 Long term (current) use of anticoagulants: Secondary | ICD-10-CM | POA: Diagnosis not present

## 2018-04-11 DIAGNOSIS — E118 Type 2 diabetes mellitus with unspecified complications: Secondary | ICD-10-CM | POA: Diagnosis not present

## 2018-04-11 DIAGNOSIS — F418 Other specified anxiety disorders: Secondary | ICD-10-CM | POA: Diagnosis not present

## 2018-04-11 DIAGNOSIS — Z7984 Long term (current) use of oral hypoglycemic drugs: Secondary | ICD-10-CM | POA: Insufficient documentation

## 2018-04-11 DIAGNOSIS — K219 Gastro-esophageal reflux disease without esophagitis: Secondary | ICD-10-CM | POA: Diagnosis not present

## 2018-04-11 DIAGNOSIS — E1142 Type 2 diabetes mellitus with diabetic polyneuropathy: Secondary | ICD-10-CM | POA: Diagnosis not present

## 2018-04-11 DIAGNOSIS — D649 Anemia, unspecified: Secondary | ICD-10-CM | POA: Diagnosis not present

## 2018-04-11 DIAGNOSIS — E785 Hyperlipidemia, unspecified: Secondary | ICD-10-CM | POA: Diagnosis not present

## 2018-04-11 LAB — GLUCOSE, POCT (MANUAL RESULT ENTRY): POC GLUCOSE: 280 mg/dL — AB (ref 70–99)

## 2018-04-11 MED ORDER — DABIGATRAN ETEXILATE MESYLATE 150 MG PO CAPS: 150.0000 mg | ORAL_CAPSULE | Freq: Two times a day (BID) | ORAL | 1 refills | Status: DC

## 2018-04-11 MED ORDER — CARVEDILOL 3.125 MG PO TABS: 3.1250 mg | ORAL_TABLET | Freq: Two times a day (BID) | ORAL | 3 refills | Status: DC

## 2018-04-11 MED ORDER — AMLODIPINE BESYLATE 10 MG PO TABS: 10.0000 mg | ORAL_TABLET | Freq: Every day | ORAL | 3 refills | Status: DC

## 2018-04-11 MED ORDER — FAMOTIDINE 20 MG PO TABS: 20.0000 mg | ORAL_TABLET | Freq: Two times a day (BID) | ORAL | 3 refills | Status: DC

## 2018-04-11 MED ORDER — GABAPENTIN 300 MG PO CAPS: 300.0000 mg | ORAL_CAPSULE | Freq: Three times a day (TID) | ORAL | 3 refills | Status: DC

## 2018-04-11 MED ORDER — FISH OIL 1000 MG PO CPDR: 3.0000 g | DELAYED_RELEASE_CAPSULE | Freq: Every day | ORAL | 6 refills | Status: AC

## 2018-04-11 MED ORDER — CALCIUM-VITAMIN D3 500-400 MG-UNIT PO TABS: 1.0000 | ORAL_TABLET | Freq: Every day | ORAL | 5 refills | Status: AC

## 2018-04-11 MED ORDER — AMITRIPTYLINE HCL 50 MG PO TABS: 50.0000 mg | ORAL_TABLET | Freq: Every day | ORAL | 3 refills | Status: DC

## 2018-04-11 MED ORDER — FENOFIBRATE 145 MG PO TABS: 145.0000 mg | ORAL_TABLET | Freq: Every day | ORAL | 3 refills | Status: DC

## 2018-04-11 MED ORDER — FERROUS SULFATE 325 (65 FE) MG PO TABS: 325.0000 mg | ORAL_TABLET | Freq: Three times a day (TID) | ORAL | 3 refills | Status: DC

## 2018-04-11 MED ORDER — HYDROXYZINE HCL 25 MG PO TABS: 25.0000 mg | ORAL_TABLET | Freq: Three times a day (TID) | ORAL | 2 refills | Status: DC | PRN

## 2018-04-11 MED ORDER — FUROSEMIDE 20 MG PO TABS: 20.0000 mg | ORAL_TABLET | Freq: Every day | ORAL | 5 refills | Status: DC

## 2018-04-11 MED ORDER — METFORMIN HCL 1000 MG PO TABS: 1000.0000 mg | ORAL_TABLET | Freq: Two times a day (BID) | ORAL | 3 refills | Status: DC

## 2018-04-11 MED FILL — FENOFIBRATE 145 MG TABLET: 145 | 30 days supply | Qty: 30 | Fill #0

## 2018-04-11 MED FILL — FERROUS SULFATE 325 MG TAB: 325 (65 FE) | 10 days supply | Qty: 30 | Fill #0

## 2018-04-11 MED FILL — AMLODIPINE BESYLATE 10 MG T: 10 | 30 days supply | Qty: 30 | Fill #0

## 2018-04-11 MED FILL — FAMOTIDINE 20 MG TABLET: 20 | 30 days supply | Qty: 60 | Fill #0

## 2018-04-11 MED FILL — CARVEDILOL 3.125 MG TABLET: 3.125 | 30 days supply | Qty: 60 | Fill #0

## 2018-04-11 MED FILL — hydrOXYzine HCL 25 MG TABS: 25 | 20 days supply | Qty: 60 | Fill #0

## 2018-04-11 MED FILL — metFORMIN HCL 1000 MG TABS: 1000 | 30 days supply | Qty: 60 | Fill #0

## 2018-04-11 MED FILL — GABAPENTIN 300 MG CAPSULE: 300 | 30 days supply | Qty: 90 | Fill #0

## 2018-04-11 MED FILL — AMITRIPTYLINE HCL 50 MG TAB: 50 | 30 days supply | Qty: 30 | Fill #0

## 2018-04-11 MED FILL — PRADAXA 150 MG CAP: 150 | 30 days supply | Qty: 60 | Fill #0

## 2018-04-11 MED FILL — FUROSEMIDE 20 MG TABLET: 20 | 30 days supply | Qty: 30 | Fill #0

## 2018-04-11 NOTE — Progress Notes (Signed)
Patient ID: Jane Arellano, female   DOB: 06/26/1961, 56 y.o.   MRN: [id number]   Jane Arellano, is a 56 y.o. female  CSN:[id number]  MRN:[id number]  DOB - 02/15/1961  Subjective:  Chief Complaint and HPI: Jane Arellano is a 56 y.o. female here today for med RF and diabetes check.  Says blood sugars at home running about 130s.  No A1C cartridges in office today but blood sugar is 280.  She does say she has been out of some BP meds for a few days.     ROS:   Constitutional:  No f/c, No night sweats, No unexplained weight loss. EENT:  No vision changes, No blurry vision, No hearing changes. No mouth, throat, or ear problems.  Respiratory: No cough, No SOB Cardiac: No CP, no palpitations GI:  No abd pain, No N/V/D. GU: No Urinary s/sx Musculoskeletal: No joint pain Neuro: No headache, no dizziness, no motor weakness.  Skin: No rash Endocrine:  No polydipsia. No polyuria.  Psych: Denies SI/HI  No problems updated.  ALLERGIES: Allergies  Allergen Reactions  . Xarelto [Rivaroxaban] Swelling    Gums bled, headaches  . Ketorolac Tromethamine Other (See Comments)    Unknown reaction    PAST MEDICAL HISTORY: Past Medical History:  Diagnosis Date  . Atypical chest pain    a. 11/2005 Negative Myoview  . Diastolic dysfunction   . DM2 (diabetes mellitus, type 2) (HCC)   . Dysrhythmia    AFIB HX CV   . GERD (gastroesophageal reflux disease)   . Heart murmur   . History of substance abuse   . HTN (hypertension)   . Iron deficiency anemia   . Paroxysmal atrial fibrillation (HCC) 05/01/2013   On Xarelto  . Subaortic membrane    a. 01/2010 Echo: EF 55-60%, No rwma, subaortic membrane with elevated LVOT mean gradient of 21 mmHg, Triv AI, mod dil LA, mildly increased PASP. b. 05/02/2013 Echo:  LVEF 60-65%, grade 1 diastolic dysfunction, mild LVH, subaortic stenosis w/ turbulation in LVOT c/w subaortic membrane (mean gradient 42 mmHg/peak gradient 81 mmHg), mild biatrial enlargement     MEDICATIONS AT HOME: Prior to Admission medications   Medication Sig Start Date End Date Taking? Authorizing Provider  ACCU-CHEK AVIVA PLUS test strip USE AS INSTRUCTED 03/06/17   Jegede, Olugbemiga E, MD  ACCU-CHEK SOFTCLIX LANCETS lancets Use as instructed 01/04/16   Jegede, Olugbemiga E, MD  acetaminophen-codeine (TYLENOL #3) 300-30 MG tablet Take 1 tablet by mouth every 4 (four) hours as needed. 04/26/17   Jegede, Olugbemiga E, MD  amitriptyline (ELAVIL) 50 MG tablet Take 1 tablet (50 mg total) by mouth at bedtime. 04/11/18   McClung, Angela M, PA-C  amLODipine (NORVASC) 10 MG tablet Take 1 tablet (10 mg total) by mouth daily. 04/11/18   McClung, Angela M, PA-C  Blood Glucose Calibration (ACCU-CHEK AVIVA) SOLN USE AS INSTRUCTED 01/11/16   Jegede, Olugbemiga E, MD  Blood Glucose Monitoring Suppl (ACCU-CHEK AVIVA PLUS) w/Device KIT USE AS DIRECTED TWICE DAILY AT 8 AM AND 10 PM 01/04/16   Jegede, Olugbemiga E, MD  Calcium Carb-Cholecalciferol (CALCIUM-VITAMIN D3) 500-400 MG-UNIT TABS Take 1 tablet by mouth daily. 04/11/18   McClung, Angela M, PA-C  carvedilol (COREG) 3.125 MG tablet Take 1 tablet (3.125 mg total) by mouth 2 (two) times daily. 04/11/18   McClung, Angela M, PA-C  dabigatran (PRADAXA) 150 MG CAPS capsule Take 1 capsule (150 mg total) by mouth 2 (two) times daily. 04/11/18   McClung, Angela M, PA-C    famotidine (PEPCID) 20 MG tablet Take 1 tablet (20 mg total) by mouth 2 (two) times daily. 04/11/18   McClung, Angela M, PA-C  fenofibrate (TRICOR) 145 MG tablet Take 1 tablet (145 mg total) by mouth daily. 04/11/18   McClung, Angela M, PA-C  ferrous sulfate 325 (65 FE) MG tablet Take 1 tablet (325 mg total) by mouth 3 (three) times daily with meals. 04/11/18   McClung, Angela M, PA-C  furosemide (LASIX) 20 MG tablet Take 1 tablet (20 mg total) by mouth daily. 04/11/18   McClung, Angela M, PA-C  gabapentin (NEURONTIN) 300 MG capsule Take 1 capsule (300 mg total) by mouth 3 (three) times daily.  04/11/18   McClung, Angela M, PA-C  GAVILAX powder MIX 1 CAPFUL WITH LIQUID AND DRINK ONCE DAILY 02/05/16   Keck, Valerie A, NP  hydrOXYzine (ATARAX/VISTARIL) 25 MG tablet Take 1 tablet (25 mg total) by mouth 3 (three) times daily as needed for anxiety. 04/11/18   McClung, Angela M, PA-C  metFORMIN (GLUCOPHAGE) 1000 MG tablet Take 1 tablet (1,000 mg total) by mouth 2 (two) times daily with a meal. 04/11/18   McClung, Angela M, PA-C  Multiple Vitamin (MULTIVITAMIN WITH MINERALS) TABS tablet Take 1 tablet by mouth daily.    [provider]  Omega-3 Fatty Acids (FISH OIL) 1000 MG CPDR Take 3 g by mouth daily. 04/11/18   McClung, Angela M, PA-C  potassium chloride (K-DUR,KLOR-CON) 10 MEQ tablet Take 1 tablet (10 mEq total) by mouth daily. 11/06/13 12/05/13  Jegede, Olugbemiga E, MD     Objective:  EXAM:   Vitals:   04/11/18 1029  BP: (!) 141/89  Pulse: 73  Resp: 16  Temp: 99 F (37.2 C)  TempSrc: Oral  SpO2: 100%  Weight: 194 lb 3.2 oz (88.1 kg)    General appearance : A&OX3. NAD. Non-toxic-appearing HEENT: Atraumatic and Normocephalic.  PERRLA. EOM intact.   Neck: supple, no JVD. No cervical lymphadenopathy. No thyromegaly Chest/Lungs:  Breathing-non-labored, Good air entry bilaterally, breath sounds normal without rales, rhonchi, or wheezing  CVS: S1 S2 regular, no murmurs, gallops, rubs  Abdomen: Bowel sounds present, Non tender and not distended with no gaurding, rigidity or rebound. Extremities: Bilateral Lower Ext shows no edema, both legs are warm to touch with = pulse throughout Neurology:  CN II-XII grossly intact, Non focal.   Psych:  TP linear. J/I WNL. Normal speech. Appropriate eye contact and affect.  Skin:  No Rash  Data Review Lab Results  Component Value Date   HGBA1C 7.0 10/25/2017   HGBA1C 7.1 04/26/2017   HGBA1C 7.1 08/18/2016     Assessment & Plan   1. Type 2 diabetes mellitus with complication, without long-term current use of insulin  (HCC) uncontrolled - Glucose (CBG) - Hemoglobin A1c Increase dose- metFORMIN (GLUCOPHAGE) 1000 MG tablet; Take 1 tablet (1,000 mg total) by mouth 2 (two) times daily with a meal.  Dispense: 180 tablet; Refill: 3 - Comprehensive metabolic panel - gabapentin (NEURONTIN) 300 MG capsule; Take 1 capsule (300 mg total) by mouth 3 (three) times daily.  Dispense: 90 capsule; Refill: 3  2. Depression with anxiety stable - hydrOXYzine (ATARAX/VISTARIL) 25 MG tablet; Take 1 tablet (25 mg total) by mouth 3 (three) times daily as needed for anxiety.  Dispense: 60 tablet; Refill: 2  3. Diabetic polyneuropathy associated with type 2 diabetes mellitus (HCC) stable - gabapentin (NEURONTIN) 300 MG capsule; Take 1 capsule (300 mg total) by mouth 3 (three) times daily.  Dispense: 90 capsule;   Refill: 3 - amitriptyline (ELAVIL) 50 MG tablet; Take 1 tablet (50 mg total) by mouth at bedtime.  Dispense: 30 tablet; Refill: 3  4. Essential hypertension Uncontrolled but out of several meds.  Resume medications and check BP OOO.  We have discussed target BP range and blood pressure goal. I have advised patient to check BP regularly and to call us back or report to clinic if the numbers are consistently higher than 140/90. We discussed the importance of compliance with medical therapy and DASH diet recommended, consequences of uncontrolled hypertension discussed.  - furosemide (LASIX) 20 MG tablet; Take 1 tablet (20 mg total) by mouth daily.  Dispense: 30 tablet; Refill: 5 - dabigatran (PRADAXA) 150 MG CAPS capsule; Take 1 capsule (150 mg total) by mouth 2 (two) times daily.  Dispense: 180 capsule; Refill: 1 - carvedilol (COREG) 3.125 MG tablet; Take 1 tablet (3.125 mg total) by mouth 2 (two) times daily.  Dispense: 60 tablet; Refill: 3 - amLODipine (NORVASC) 10 MG tablet; Take 1 tablet (10 mg total) by mouth daily.  Dispense: 90 tablet; Refill: 3  5. Dyslipidemia - Omega-3 Fatty Acids (FISH OIL) 1000 MG CPDR; Take 3 g  by mouth daily.  Dispense: 90 capsule; Refill: 6 - fenofibrate (TRICOR) 145 MG tablet; Take 1 tablet (145 mg total) by mouth daily.  Dispense: 90 tablet; Refill: 3  6. Gastroesophageal reflux disease without esophagitis  - famotidine (PEPCID) 20 MG tablet; Take 1 tablet (20 mg total) by mouth 2 (two) times daily.  Dispense: 60 tablet; Refill: 3  7. Anemia, unspecified type - CBC with Differential/Platelet - ferrous sulfate 325 (65 FE) MG tablet; Take 1 tablet (325 mg total) by mouth 3 (three) times daily with meals.  Dispense: 90 tablet; Refill: 3    Patient have been counseled extensively about nutrition and exercise  Return in about 3 months (around 07/12/2018) for assign PCP; f/up DM and other issues.  The patient was given clear instructions to go to ER or return to medical center if symptoms don't improve, worsen or new problems develop. The patient verbalized understanding. The patient was told to call to get lab results if they haven't heard anything in the next week.     Freeman Caldron, PA-C [facility] [place], [employer]   04/11/2018, 11:00 AM

## 2018-04-12 ENCOUNTER — Telehealth: Payer: Self-pay

## 2018-04-12 LAB — COMPREHENSIVE METABOLIC PANEL
A/G RATIO: 1.3 (ref 1.2–2.2)
ALBUMIN: 4.3 g/dL (ref 3.5–5.5)
ALT: 15 IU/L (ref 0–32)
AST: 17 IU/L (ref 0–40)
Alkaline Phosphatase: 49 IU/L (ref 39–117)
BUN/Creatinine Ratio: 17 (ref 9–23)
BUN: 11 mg/dL (ref 6–24)
Bilirubin Total: 0.2 mg/dL (ref 0.0–1.2)
CALCIUM: 10.1 mg/dL (ref 8.7–10.2)
CO2: 20 mmol/L (ref 20–29)
CREATININE: 0.65 mg/dL (ref 0.57–1.00)
Chloride: 102 mmol/L (ref 96–106)
GFR calc non Af Amer: 99 mL/min/{1.73_m2} (ref >59)
GFR, EST AFRICAN AMERICAN: 114 mL/min/{1.73_m2} (ref >59)
GLOBULIN, TOTAL: 3.2 g/dL (ref 1.5–4.5)
Glucose: 227 mg/dL — ABNORMAL HIGH (ref 65–99)
POTASSIUM: 4.5 mmol/L (ref 3.5–5.2)
SODIUM: 140 mmol/L (ref 134–144)
Total Protein: 7.5 g/dL (ref 6.0–8.5)

## 2018-04-12 LAB — CBC WITH DIFFERENTIAL/PLATELET
BASOS ABS: 0 10*3/uL (ref 0.0–0.2)
Basos: 0 %
EOS (ABSOLUTE): 0 10*3/uL (ref 0.0–0.4)
EOS: 1 %
HEMOGLOBIN: 12.2 g/dL (ref 11.1–15.9)
Hematocrit: 37 % (ref 34.0–46.6)
IMMATURE GRANS (ABS): 0 10*3/uL (ref 0.0–0.1)
IMMATURE GRANULOCYTES: 1 %
LYMPHS: 33 %
Lymphocytes Absolute: 1.4 10*3/uL (ref 0.7–3.1)
MCH: 33 pg (ref 26.6–33.0)
MCHC: 33 g/dL (ref 31.5–35.7)
MCV: 100 fL — ABNORMAL HIGH (ref 79–97)
Monocytes Absolute: 0.2 10*3/uL (ref 0.1–0.9)
Monocytes: 6 %
NEUTROS ABS: 2.5 10*3/uL (ref 1.4–7.0)
NEUTROS PCT: 59 %
PLATELETS: 204 10*3/uL (ref 150–450)
RBC: 3.7 x10E6/uL — ABNORMAL LOW (ref 3.77–5.28)
RDW: 13.3 % (ref 12.3–15.4)
WBC: 4.3 10*3/uL (ref 3.4–10.8)

## 2018-04-12 LAB — HEMOGLOBIN A1C
Est. average glucose Bld gHb Est-mCnc: 171 mg/dL
HEMOGLOBIN A1C: 7.6 % — AB (ref 4.8–5.6)

## 2018-04-12 NOTE — Telephone Encounter (Signed)
Contacted pt to go over lab results pt is aware and doesn't have any questions or concerns 

## 2018-04-20 ENCOUNTER — Other Ambulatory Visit: Payer: Self-pay | Admitting: Internal Medicine

## 2018-04-20 DIAGNOSIS — Z1231 Encounter for screening mammogram for malignant neoplasm of breast: Secondary | ICD-10-CM

## 2018-04-20 MED FILL — TERBINAFINE HCL 250 MG TAB: 250 | 30 days supply | Qty: 30 | Fill #4

## 2018-05-09 MED FILL — AMLODIPINE BESYLATE 10 MG T: 10 | 30 days supply | Qty: 30 | Fill #1

## 2018-05-09 MED FILL — FUROSEMIDE 20 MG TABLET: 20 | 30 days supply | Qty: 30 | Fill #1

## 2018-05-09 MED FILL — FERROUS SULFATE 325 MG TAB: 325 (65 FE) | 30 days supply | Qty: 90 | Fill #1

## 2018-05-14 MED FILL — FENOFIBRATE 145 MG TABLET: 145 | 30 days supply | Qty: 30 | Fill #1

## 2018-05-14 MED FILL — FAMOTIDINE 20 MG TABLET: 20 | 30 days supply | Qty: 60 | Fill #1

## 2018-05-14 MED FILL — CARVEDILOL 3.125 MG TABLET: 3.125 | 30 days supply | Qty: 60 | Fill #1

## 2018-05-16 ENCOUNTER — Ambulatory Visit: Payer: Medicaid Other

## 2018-05-21 MED FILL — metFORMIN HCL 1000 MG TABS: 1000 | 30 days supply | Qty: 60 | Fill #1

## 2018-05-21 MED FILL — TERBINAFINE HCL 250 MG TAB: 250 | 30 days supply | Qty: 30 | Fill #5

## 2018-06-05 MED FILL — PRADAXA 150 MG CAP: 150 | 30 days supply | Qty: 60 | Fill #1

## 2018-06-05 MED FILL — FUROSEMIDE 20 MG TABLET: 20 | 30 days supply | Qty: 30 | Fill #2

## 2018-06-05 MED FILL — AMLODIPINE BESYLATE 10 MG T: 10 | 30 days supply | Qty: 30 | Fill #2

## 2018-06-18 ENCOUNTER — Ambulatory Visit: Payer: Medicaid Other

## 2018-06-20 ENCOUNTER — Other Ambulatory Visit: Payer: Self-pay

## 2018-06-20 MED ORDER — ACCU-CHEK SOFTCLIX LANCETS MISC
0 refills | Status: DC
Start: 2018-06-20 — End: 2018-06-22

## 2018-06-20 MED ORDER — GLUCOSE BLOOD VI STRP: ORAL_STRIP | 0 refills | Status: DC

## 2018-06-20 MED FILL — FENOFIBRATE 145 MG TABLET: 145 | 90 days supply | Qty: 90 | Fill #2

## 2018-06-20 MED FILL — ACCU-CHEK AVIVA PLUS TEST S: 30 days supply | Qty: 100 | Fill #0

## 2018-06-22 ENCOUNTER — Other Ambulatory Visit: Payer: Self-pay

## 2018-06-22 MED ORDER — ACCU-CHEK FASTCLIX LANCETS MISC: 0 refills | Status: DC

## 2018-06-22 MED FILL — ACCU-CHEK FASTCLIX LANCETS: 25 days supply | Qty: 102 | Fill #0

## 2018-07-02 ENCOUNTER — Ambulatory Visit (HOSPITAL_COMMUNITY): Payer: Medicaid Other | Admitting: Nurse Practitioner

## 2018-07-04 MED FILL — FUROSEMIDE 20 MG TABLET: 20 | 30 days supply | Qty: 30 | Fill #3

## 2018-07-04 MED FILL — AMLODIPINE BESYLATE 10 MG T: 10 | 30 days supply | Qty: 30 | Fill #3

## 2018-07-04 MED FILL — FAMOTIDINE 20 MG TABLET: 20 | 30 days supply | Qty: 60 | Fill #2

## 2018-07-04 MED FILL — CARVEDILOL 3.125 MG TABLET: 3.125 | 30 days supply | Qty: 60 | Fill #2

## 2018-07-05 ENCOUNTER — Ambulatory Visit (HOSPITAL_COMMUNITY)
Admission: RE | Admit: 2018-07-05 | Discharge: 2018-07-05 | Disposition: A | Discharge status: Home / Self Care | Payer: Medicaid Other | Source: Ambulatory Visit | Attending: Nurse Practitioner | Admitting: Nurse Practitioner

## 2018-07-05 ENCOUNTER — Encounter (HOSPITAL_COMMUNITY): Payer: Self-pay | Admitting: Nurse Practitioner

## 2018-07-05 VITALS — BP 132/80 | HR 66 | Ht 67.0 in | Wt 191.0 lb

## 2018-07-05 DIAGNOSIS — Z800 Family history of malignant neoplasm of digestive organs: Secondary | ICD-10-CM | POA: Diagnosis not present

## 2018-07-05 DIAGNOSIS — I498 Other specified cardiac arrhythmias: Secondary | ICD-10-CM | POA: Diagnosis not present

## 2018-07-05 DIAGNOSIS — D509 Iron deficiency anemia, unspecified: Secondary | ICD-10-CM | POA: Insufficient documentation

## 2018-07-05 DIAGNOSIS — I10 Essential (primary) hypertension: Secondary | ICD-10-CM | POA: Insufficient documentation

## 2018-07-05 DIAGNOSIS — Z7984 Long term (current) use of oral hypoglycemic drugs: Secondary | ICD-10-CM | POA: Insufficient documentation

## 2018-07-05 DIAGNOSIS — Z888 Allergy status to other drugs, medicaments and biological substances status: Secondary | ICD-10-CM | POA: Diagnosis not present

## 2018-07-05 DIAGNOSIS — K219 Gastro-esophageal reflux disease without esophagitis: Secondary | ICD-10-CM | POA: Insufficient documentation

## 2018-07-05 DIAGNOSIS — Z9889 Other specified postprocedural states: Secondary | ICD-10-CM | POA: Diagnosis not present

## 2018-07-05 DIAGNOSIS — I480 Paroxysmal atrial fibrillation: Secondary | ICD-10-CM | POA: Insufficient documentation

## 2018-07-05 DIAGNOSIS — Z79891 Long term (current) use of opiate analgesic: Secondary | ICD-10-CM | POA: Diagnosis not present

## 2018-07-05 DIAGNOSIS — Z7901 Long term (current) use of anticoagulants: Secondary | ICD-10-CM | POA: Insufficient documentation

## 2018-07-05 DIAGNOSIS — E119 Type 2 diabetes mellitus without complications: Secondary | ICD-10-CM | POA: Diagnosis not present

## 2018-07-05 DIAGNOSIS — Z79899 Other long term (current) drug therapy: Secondary | ICD-10-CM | POA: Insufficient documentation

## 2018-07-05 DIAGNOSIS — Z8249 Family history of ischemic heart disease and other diseases of the circulatory system: Secondary | ICD-10-CM | POA: Diagnosis not present

## 2018-07-05 DIAGNOSIS — Z823 Family history of stroke: Secondary | ICD-10-CM | POA: Insufficient documentation

## 2018-07-05 DIAGNOSIS — Z833 Family history of diabetes mellitus: Secondary | ICD-10-CM | POA: Insufficient documentation

## 2018-07-05 NOTE — Progress Notes (Signed)
Patient ID: Jane Arellano, female   DOB: 03-28-1961, 57 y.o.   MRN: [id number]     Primary Care Physician: Tresa Garter, MD Referring Physician: Dr. Candy Sledge Jane Arellano is a 57 y.o. female with a h/o subaortic membrane, S/p open heart surgery,2015, PAF in the afib clinic for f/u. Dx'ed 6/14, asymtpomatic, picked up on physical exam. Apparently very little afib burden since then. She reports that she feels well . Her health has been stable. She has not noticed any reoccurring afib. Continues on DOAC, carvedilol. No bleeding issues.  Today, she denies symptoms of palpitations, chest pain, shortness of breath, orthopnea, PND, lower extremity edema, dizziness, presyncope, syncope, or neurologic sequela. The patient is tolerating medications without difficulties and is otherwise without complaint today.   Past Medical History:  Diagnosis Date  . Atypical chest pain    a. 11/2005 Negative Myoview  . Diastolic dysfunction   . DM2 (diabetes mellitus, type 2) ([place])   . Dysrhythmia    AFIB HX CV   . GERD (gastroesophageal reflux disease)   . Heart murmur   . History of substance abuse   . HTN (hypertension)   . Iron deficiency anemia   . Paroxysmal atrial fibrillation ([place]) 05/01/2013   On Xarelto  . Subaortic membrane    a. 01/2010 Echo: EF 55-60%, No rwma, subaortic membrane with elevated LVOT mean gradient of 21 mmHg, Triv AI, mod dil LA, mildly increased PASP. b. 05/02/2013 Echo:  LVEF 94-76%, grade 1 diastolic dysfunction, mild LVH, subaortic stenosis w/ turbulation in LVOT c/w subaortic membrane (mean gradient 42 mmHg/peak gradient 81 mmHg), mild biatrial enlargement   Past Surgical History:  Procedure Laterality Date  . CARDIOVERSION N/A 11/04/2013   Procedure: CARDIOVERSION;  Surgeon: Peter M Martinique, MD;  Location: [facility] ENDOSCOPY;  Service: Cardiovascular;  Laterality: N/A;  . CESAREAN SECTION      Current Outpatient Medications  Medication Sig Dispense Refill  .  ACCU-CHEK FASTCLIX LANCETS MISC USE AS DIRECTED 100 each 0  . acetaminophen-codeine (TYLENOL #3) 300-30 MG tablet Take 1 tablet by mouth every 4 (four) hours as needed. 30 tablet 0  . amitriptyline (ELAVIL) 50 MG tablet Take 1 tablet (50 mg total) by mouth at bedtime. 30 tablet 3  . amLODipine (NORVASC) 10 MG tablet Take 1 tablet (10 mg total) by mouth daily. 90 tablet 3  . Blood Glucose Calibration (ACCU-CHEK AVIVA) SOLN USE AS INSTRUCTED 1 each 0  . Blood Glucose Monitoring Suppl (ACCU-CHEK AVIVA PLUS) w/Device KIT USE AS DIRECTED TWICE DAILY AT 8 AM AND 10 PM 1 kit 0  . Calcium Carb-Cholecalciferol (CALCIUM-VITAMIN D3) 500-400 MG-UNIT TABS Take 1 tablet by mouth daily. 60 tablet 5  . carvedilol (COREG) 3.125 MG tablet Take 1 tablet (3.125 mg total) by mouth 2 (two) times daily. 60 tablet 3  . dabigatran (PRADAXA) 150 MG CAPS capsule Take 1 capsule (150 mg total) by mouth 2 (two) times daily. 180 capsule 1  . fenofibrate (TRICOR) 145 MG tablet Take 1 tablet (145 mg total) by mouth daily. 90 tablet 3  . ferrous sulfate 325 (65 FE) MG tablet Take 1 tablet (325 mg total) by mouth 3 (three) times daily with meals. 90 tablet 3  . furosemide (LASIX) 20 MG tablet Take 1 tablet (20 mg total) by mouth daily. 30 tablet 5  . gabapentin (NEURONTIN) 300 MG capsule Take 1 capsule (300 mg total) by mouth 3 (three) times daily. 90 capsule 3  . GAVILAX powder MIX  1 CAPFUL WITH LIQUID AND DRINK ONCE DAILY 476 g 2  . glucose blood (ACCU-CHEK AVIVA PLUS) test strip USE AS INSTRUCTED 100 each 0  . hydrOXYzine (ATARAX/VISTARIL) 25 MG tablet Take 1 tablet (25 mg total) by mouth 3 (three) times daily as needed for anxiety. 60 tablet 2  . metFORMIN (GLUCOPHAGE) 1000 MG tablet Take 1 tablet (1,000 mg total) by mouth 2 (two) times daily with a meal. 180 tablet 3  . Multiple Vitamin (MULTIVITAMIN WITH MINERALS) TABS tablet Take 1 tablet by mouth daily.    . Omega-3 Fatty Acids (FISH OIL) 1000 MG CPDR Take 3 g by mouth  daily. 90 capsule 6  . famotidine (PEPCID) 20 MG tablet Take 1 tablet (20 mg total) by mouth 2 (two) times daily. (Patient not taking: Reported on 07/05/2018) 60 tablet 3   No current facility-administered medications for this encounter.     Allergies  Allergen Reactions  . Xarelto [Rivaroxaban] Swelling    Gums bled, headaches  . Ketorolac Tromethamine Other (See Comments)    Unknown reaction    Social History   Socioeconomic History  . Marital status: Single    Spouse name: Not on file  . Number of children: Not on file  . Years of education: Not on file  . Highest education level: Not on file  Occupational History  . Not on file  Social Needs  . Financial resource strain: Not on file  . Food insecurity:    Worry: Not on file    Inability: Not on file  . Transportation needs:    Medical: Not on file    Non-medical: Not on file  Tobacco Use  . Smoking status: Never Smoker  . Smokeless tobacco: Current User    Types: Snuff  Substance and Sexual Activity  . Alcohol use: No    Comment: Drinks 3 - 40 oz beers daily.  i QUIT DOING THAT "  . Drug use: No    Types: Marijuana    Comment: Smokes marijuana weekly.  Has not used cocaine in 9 years.  . Sexual activity: Yes    Birth control/protection: None  Lifestyle  . Physical activity:    Days per week: Not on file    Minutes per session: Not on file  . Stress: Not on file  Relationships  . Social connections:    Talks on phone: Not on file    Gets together: Not on file    Attends religious service: Not on file    Active member of club or organization: Not on file    Attends meetings of clubs or organizations: Not on file    Relationship status: Not on file  . Intimate partner violence:    Fear of current or ex partner: Not on file    Emotionally abused: Not on file    Physically abused: Not on file    Forced sexual activity: Not on file  Other Topics Concern  . Not on file  Social History Narrative   Lives in  [place] by herself.  She had been caring for her mother but she died 2 mos ago.  She tries to remain active but does not regularly exercise.    Family History  Problem Relation Age of Onset  . Diabetes Father        deceased  . Cancer Mother 35       colon  . Stroke Mother   . Hypertension Sister        alive  and well  . Hypertension Sister        alive and well  . Heart attack Brother        deceased @ 21    ROS- All systems are reviewed and negative except as per the HPI above  Physical Exam: Vitals:   07/05/18 1041  BP: 132/80  Pulse: 66  Weight: 86.6 kg  Height: '5\' 7"'$  (1.702 m)    GEN- The patient is well appearing, alert and oriented x 3 today.   Head- normocephalic, atraumatic Eyes-  Sclera clear, conjunctiva pink Ears- hearing intact Oropharynx- clear Neck- supple, no JVP Lymph- no cervical lymphadenopathy Lungs- Clear to ausculation bilaterally, normal work of breathing Heart- Regular rate and rhythm, 2/6 systolic murmiur murmurs, rubs or gallops, PMI not laterally displaced GI- soft, NT, ND, + BS Extremities- no clubbing, cyanosis, or edema MS- no significant deformity or atrophy Skin- no rash or lesion Psych- euthymic mood, full affect Neuro- strength and sensation are intact  EKG-SR with sinus arrhythmia at 66 bpm, pr int 148 ms, qrs int 82 ms, qtc 446 ma  Epic records reviewed  Assessment and Plan: 1. PAF Continues in SR Continue carvedilol Continue pradaxa for chadsvasc of at least 3, last creatinine stable at 0.65 mg/dL Update echo  2. Htn Stable  F/u in afib clinic in 9 months  Butch Penny C. Gracyn Allor, [facility] [address] [place], [place] [address] [phone]

## 2018-07-07 ENCOUNTER — Other Ambulatory Visit: Payer: Self-pay

## 2018-07-07 ENCOUNTER — Emergency Department (HOSPITAL_COMMUNITY): Payer: Medicaid Other

## 2018-07-07 ENCOUNTER — Encounter (HOSPITAL_COMMUNITY): Payer: Self-pay | Admitting: Emergency Medicine

## 2018-07-07 ENCOUNTER — Emergency Department (HOSPITAL_COMMUNITY)
Admission: EM | Admit: 2018-07-07 | Discharge: 2018-07-07 | Disposition: A | Discharge status: Home / Self Care | Payer: Medicaid Other | Attending: Emergency Medicine | Admitting: Emergency Medicine

## 2018-07-07 DIAGNOSIS — Z79899 Other long term (current) drug therapy: Secondary | ICD-10-CM | POA: Insufficient documentation

## 2018-07-07 DIAGNOSIS — Z7984 Long term (current) use of oral hypoglycemic drugs: Secondary | ICD-10-CM | POA: Diagnosis not present

## 2018-07-07 DIAGNOSIS — E119 Type 2 diabetes mellitus without complications: Secondary | ICD-10-CM | POA: Insufficient documentation

## 2018-07-07 DIAGNOSIS — R079 Chest pain, unspecified: Secondary | ICD-10-CM

## 2018-07-07 DIAGNOSIS — R072 Precordial pain: Secondary | ICD-10-CM

## 2018-07-07 LAB — CBC
HEMATOCRIT: 37.8 % (ref 36.0–46.0)
Hemoglobin: 12 g/dL (ref 12.0–15.0)
MCH: 32.3 pg (ref 26.0–34.0)
MCHC: 31.7 g/dL (ref 30.0–36.0)
MCV: 101.6 fL — ABNORMAL HIGH (ref 78.0–100.0)
PLATELETS: 174 10*3/uL (ref 150–400)
RBC: 3.72 MIL/uL — AB (ref 3.87–5.11)
RDW: 12.6 % (ref 11.5–15.5)
WBC: 3.9 10*3/uL — AB (ref 4.0–10.5)

## 2018-07-07 LAB — BASIC METABOLIC PANEL
Anion gap: 9 (ref 5–15)
BUN: 8 mg/dL (ref 6–20)
CO2: 23 mmol/L (ref 22–32)
Calcium: 9.5 mg/dL (ref 8.9–10.3)
Chloride: 106 mmol/L (ref 98–111)
Creatinine, Ser: 0.66 mg/dL (ref 0.44–1.00)
GFR calc Af Amer: >60 mL/min (ref >60)
Glucose, Bld: 173 mg/dL — ABNORMAL HIGH (ref 70–99)
POTASSIUM: 3.8 mmol/L (ref 3.5–5.1)
Sodium: 138 mmol/L (ref 135–145)

## 2018-07-07 LAB — I-STAT TROPONIN, ED
Troponin i, poc: 0.01 ng/mL (ref 0.00–0.08)
Troponin i, poc: 0 ng/mL (ref 0.00–0.08)

## 2018-07-07 LAB — I-STAT BETA HCG BLOOD, ED (MC, WL, AP ONLY)

## 2018-07-07 MED ORDER — ACETAMINOPHEN 325 MG PO TABS
650.0000 mg | ORAL_TABLET | Freq: Once | ORAL | Status: AC
  Administered 2018-07-07: 650 mg via ORAL
  Filled 2018-07-07: qty 2

## 2018-07-07 MED ORDER — SUCRALFATE 1 G PO TABS
1.0000 g | ORAL_TABLET | Freq: Once | ORAL | Status: AC
  Administered 2018-07-07: 1 g via ORAL
  Filled 2018-07-07: qty 1

## 2018-07-07 MED ORDER — METOCLOPRAMIDE HCL 10 MG PO TABS
20.0000 mg | ORAL_TABLET | Freq: Once | ORAL | Status: AC
  Administered 2018-07-07: 20 mg via ORAL
  Filled 2018-07-07: qty 2

## 2018-07-07 NOTE — ED Notes (Signed)
Please note: urine collected, in triage in 'Main Lab' tray.

## 2018-07-07 NOTE — ED Triage Notes (Signed)
Pt states hx of reflux, saw cardiologist yesterday who told her to take acid reducer which she did without relief, has also taken tylenol but states that her chest continues to hurt. Denies cough or sob.

## 2018-07-07 NOTE — ED Provider Notes (Signed)
[facility] EMERGENCY DEPARTMENT Provider Note   CSN: [id number] Arrival date & time: 07/07/18  1031     History   Chief Complaint Chief Complaint  Patient presents with  . Chest Pain    HPI Jane Arellano is a 57 y.o. female.  HPI  57 year old female with history of diabetes, hypertension, paroxysmal A. fib and subaortic membrane repair comes in with chief complaint of heartburn.  Patient reports that she is been having reflux-like symptoms for the last several days.  Her symptoms have been typically off and on, but now her symptoms are more persistent.  She saw cardiology with her symptoms, and was started on acid reflux medications.  Patient states that she started taking the medication yesterday but has not had any relief.  Patient does not have any associated shortness of breath, nausea, diaphoresis.  She has no known CAD history.  Past Medical History:  Diagnosis Date  . Atypical chest pain    a. 11/2005 Negative Myoview  . Diastolic dysfunction   . DM2 (diabetes mellitus, type 2) ([place])   . Dysrhythmia    AFIB HX CV   . GERD (gastroesophageal reflux disease)   . Heart murmur   . History of substance abuse   . HTN (hypertension)   . Iron deficiency anemia   . Paroxysmal atrial fibrillation ([place]) 05/01/2013   On Xarelto  . Subaortic membrane    a. 01/2010 Echo: EF 55-60%, No rwma, subaortic membrane with elevated LVOT mean gradient of 21 mmHg, Triv AI, mod dil LA, mildly increased PASP. b. 05/02/2013 Echo:  LVEF 51-02%, grade 1 diastolic dysfunction, mild LVH, subaortic stenosis w/ turbulation in LVOT c/w subaortic membrane (mean gradient 42 mmHg/peak gradient 81 mmHg), mild biatrial enlargement    Patient Active Problem List   Diagnosis Date Noted  . Dyslipidemia 10/25/2017  . Pap smear for cervical cancer screening 08/18/2016  . Diabetic polyneuropathy associated with type 2 diabetes mellitus ([place]) 01/14/2016  . Diabetic eye exam ([place])  01/14/2016  . Screening for colorectal cancer 01/14/2016  . Neck pain 06/29/2015  . Type 2 diabetes mellitus with complication, without long-term current use of insulin ([place]) 03/16/2015  . Essential hypertension 03/16/2015  . BV (bacterial vaginosis) 12/11/2014  . Screening for colon cancer 12/10/2014  . Keloid scar 12/10/2014  . Screening for STD (sexually transmitted disease) 12/10/2014  . Onychomycosis of toenail 12/10/2014  . Cerumen impaction 12/10/2014  . Diabetes mellitus type 2 with neurological manifestations ([place]) 08/14/2014  . Blurry vision, bilateral 08/14/2014  . PAF (paroxysmal atrial fibrillation) ([place]) 05/08/2013  . ETOH abuse 05/02/2013  . Marijuana abuse 05/02/2013  . Diastolic dysfunction 58/52/7782  . Depression with anxiety 05/01/2013  . Subaortic membrane   . HTN (hypertension)   . 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 GERD 04/30/2007    Past Surgical History:  Procedure Laterality Date  . CARDIOVERSION N/A 11/04/2013   Procedure: CARDIOVERSION;  Surgeon: Peter M Martinique, MD;  Location: [facility] ENDOSCOPY;  Service: Cardiovascular;  Laterality: N/A;  . CESAREAN SECTION       OB History   None      Home Medications    Prior to Admission medications   Medication Sig Start Date End Date Taking? Authorizing Provider  ACCU-CHEK FASTCLIX LANCETS MISC USE AS DIRECTED 06/22/18   Charlott Rakes, MD  acetaminophen-codeine (TYLENOL #3) 300-30 MG tablet Take 1 tablet by mouth every 4 (four) hours as needed. 04/26/17   Tresa Garter, MD  amitriptyline (ELAVIL)  50 MG tablet Take 1 tablet (50 mg total) by mouth at bedtime. 04/11/18   Argentina Donovan, PA-C  amLODipine (NORVASC) 10 MG tablet Take 1 tablet (10 mg total) by mouth daily. 04/11/18   Argentina Donovan, PA-C  Blood Glucose Calibration (ACCU-CHEK AVIVA) SOLN USE AS INSTRUCTED 01/11/16   Tresa Garter, MD  Blood Glucose Monitoring Suppl (ACCU-CHEK AVIVA PLUS) w/Device KIT USE AS DIRECTED TWICE  DAILY AT 8 AM AND 10 PM 01/04/16   Jegede, Marlena Clipper, MD  Calcium Carb-Cholecalciferol (CALCIUM-VITAMIN D3) 500-400 MG-UNIT TABS Take 1 tablet by mouth daily. 04/11/18   Argentina Donovan, PA-C  carvedilol (COREG) 3.125 MG tablet Take 1 tablet (3.125 mg total) by mouth 2 (two) times daily. 04/11/18   Argentina Donovan, PA-C  dabigatran (PRADAXA) 150 MG CAPS capsule Take 1 capsule (150 mg total) by mouth 2 (two) times daily. 04/11/18   Argentina Donovan, PA-C  famotidine (PEPCID) 20 MG tablet Take 1 tablet (20 mg total) by mouth 2 (two) times daily. Patient not taking: Reported on 07/05/2018 04/11/18   Argentina Donovan, PA-C  fenofibrate (TRICOR) 145 MG tablet Take 1 tablet (145 mg total) by mouth daily. 04/11/18   Argentina Donovan, PA-C  ferrous sulfate 325 (65 FE) MG tablet Take 1 tablet (325 mg total) by mouth 3 (three) times daily with meals. 04/11/18   Argentina Donovan, PA-C  furosemide (LASIX) 20 MG tablet Take 1 tablet (20 mg total) by mouth daily. 04/11/18   Argentina Donovan, PA-C  gabapentin (NEURONTIN) 300 MG capsule Take 1 capsule (300 mg total) by mouth 3 (three) times daily. 04/11/18   McClung, Dionne Bucy, PA-C  GAVILAX powder MIX 1 CAPFUL WITH LIQUID AND DRINK ONCE DAILY 02/05/16   Chari Manning A, NP  glucose blood (ACCU-CHEK AVIVA PLUS) test strip USE AS INSTRUCTED 06/20/18   Charlott Rakes, MD  hydrOXYzine (ATARAX/VISTARIL) 25 MG tablet Take 1 tablet (25 mg total) by mouth 3 (three) times daily as needed for anxiety. 04/11/18   Argentina Donovan, PA-C  metFORMIN (GLUCOPHAGE) 1000 MG tablet Take 1 tablet (1,000 mg total) by mouth 2 (two) times daily with a meal. 04/11/18   McClung, Dionne Bucy, PA-C  Multiple Vitamin (MULTIVITAMIN WITH MINERALS) TABS tablet Take 1 tablet by mouth daily.    [provider]  Omega-3 Fatty Acids (FISH OIL) 1000 MG CPDR Take 3 g by mouth daily. 04/11/18   Argentina Donovan, PA-C  potassium chloride (K-DUR,KLOR-CON) 10 MEQ tablet Take 1 tablet (10 mEq total)  by mouth daily. 11/06/13 12/05/13  Tresa Garter, MD    Family History Family History  Problem Relation Age of Onset  . Diabetes Father        deceased  . Cancer Mother 47       colon  . Stroke Mother   . Hypertension Sister        alive and well  . Hypertension Sister        alive and well  . Heart attack Brother        deceased @ 62    Social History Social History   Tobacco Use  . Smoking status: Never Smoker  . Smokeless tobacco: Current User    Types: Snuff  Substance Use Topics  . Alcohol use: No    Comment: Drinks 3 - 40 oz beers daily.  i QUIT DOING THAT "  . Drug use: No    Types: Marijuana  Comment: Smokes marijuana weekly.  Has not used cocaine in 9 years.     Allergies   Xarelto [rivaroxaban] and Ketorolac tromethamine   Review of Systems Review of Systems  Constitutional: Negative for diaphoresis.  Respiratory: Negative for chest tightness and shortness of breath.   Cardiovascular: Positive for chest pain.  Gastrointestinal: Negative for nausea.  Hematological: Bruises/bleeds easily.     Physical Exam Updated Vital Signs BP 109/66   Pulse (!) 56   Temp 97.7 F (36.5 C) (Oral)   Resp 20   Ht _0  (1.702 m)   Wt 86.6 kg   SpO2 99%   BMI 29.91 kg/m   Physical Exam  Constitutional: She is oriented to person, place, and time. She appears well-developed.  HENT:  Head: Normocephalic and atraumatic.  Eyes: EOM are normal.  Neck: Normal range of motion. Neck supple.  Cardiovascular: Normal rate, intact distal pulses and normal pulses.  Pulmonary/Chest: Effort normal.  Abdominal: Bowel sounds are normal.  Musculoskeletal:       Right lower leg: She exhibits no edema.       Left lower leg: She exhibits no edema.  Neurological: She is alert and oriented to person, place, and time.  Skin: Skin is warm and dry.  Nursing note and vitals reviewed.    ED Treatments / Results  Labs (all labs ordered are listed, but only abnormal  results are displayed) Labs Reviewed  BASIC METABOLIC PANEL - Abnormal; Notable for the following components:      Result Value   Glucose, Bld 173 (*)    All other components within normal limits  CBC - Abnormal; Notable for the following components:   WBC 3.9 (*)    RBC 3.72 (*)    MCV 101.6 (*)    All other components within normal limits  I-STAT TROPONIN, ED  I-STAT BETA HCG BLOOD, ED ([facility], [facility], [facility] ONLY)  I-STAT TROPONIN, ED    EKG EKG Interpretation  Date/Time:  Saturday July 07 2018 10:47:56 EDT Ventricular Rate:  53 PR Interval:  146 QRS Duration: 82 QT Interval:  440 QTC Calculation: 412 R Axis:   25 Text Interpretation:  Unusual P axis, possible ectopic atrial bradycardia Abnormal ECG No acute changes No significant change since last tracing Confirmed by Varney Biles [phone]) on 07/07/2018 1:27:22 PM   Radiology Dg Chest 2 View  Result Date: 07/07/2018 CLINICAL DATA:  Acute chest pain for 3 days. EXAM: CHEST - 2 VIEW COMPARISON:  07/12/2016 and prior radiographs FINDINGS: Mild cardiomegaly and median sternotomy again noted. There is no evidence of focal airspace disease, pulmonary edema, suspicious pulmonary nodule/mass, pleural effusion, or pneumothorax. No acute bony abnormalities are identified. IMPRESSION: Mild cardiomegaly without evidence of acute cardiopulmonary disease. Electronically Signed   By: Margarette Canada M.D.   On: 07/07/2018 11:32    Procedures Procedures (including critical care time)  Medications Ordered in ED Medications  sucralfate (CARAFATE) tablet 1 g (1 g Oral Given 07/07/18 1255)  acetaminophen (TYLENOL) tablet 650 mg (650 mg Oral Given 07/07/18 1255)  metoCLOPramide (REGLAN) tablet 20 mg (20 mg Oral Given 07/07/18 1309)     Initial Impression / Assessment and Plan / ED Course  I have reviewed the triage vital signs and the nursing notes.  Pertinent labs & imaging results that were available during my care of the patient were reviewed by me  and considered in my medical decision making (see chart for details).     57 year old female comes in with  chief complaint of chest discomfort.  Patient has history of hypertension, diabetes and subaortic membrane repair.  She has been having some heartburn type pain.  Patient denies any hematemesis or melena.  She does not have any major risk factors for peptic ulcer disease.  HEAR score is 3 (age, risk factor, ekg). Delta troponins ordered and negative.  Results from the ER workup discussed with the patient face to face and all questions answered to the best of my ability. We will advised patient to follow-up with her PCP.  If her symptoms continue despite long-term use of antacid then she might need a GI follow-up.  Final Clinical Impressions(s) / ED Diagnoses   Final diagnoses:  Nonspecific chest pain  Precordial chest pain    ED Discharge Orders    None       Varney Biles, MD 07/07/18 1516

## 2018-07-07 NOTE — ED Notes (Signed)
Family at bedside. 

## 2018-07-07 NOTE — Discharge Instructions (Addendum)
We saw you in the ER for the chest pain - shortness of breath. °All of our cardiac workup is normal, including labs, EKG and chest X-RAY are normal. °We are not sure what is causing your discomfort, but we feel comfortable sending you home at this time. The workup in the ER is not complete, and you should follow up with your primary care doctor for further evaluation. ° °Please return to the ER if you have worsening chest pain, shortness of breath, pain radiating to your jaw, shoulder, or back, sweats or fainting. Otherwise see your primary care doctor as requested. ° °

## 2018-07-09 ENCOUNTER — Ambulatory Visit: Payer: Medicaid Other

## 2018-07-10 MED FILL — AMITRIPTYLINE HCL 50 MG TAB: 50 | 90 days supply | Qty: 90 | Fill #1

## 2018-07-10 MED FILL — PRADAXA 150 MG CAP: 150 | 30 days supply | Qty: 60 | Fill #2

## 2018-07-10 MED FILL — metFORMIN HCL 1000 MG TABS: 1000 | 90 days supply | Qty: 180 | Fill #2

## 2018-07-12 ENCOUNTER — Ambulatory Visit: Payer: Medicaid Other | Admitting: Family Medicine

## 2018-07-13 ENCOUNTER — Ambulatory Visit (HOSPITAL_COMMUNITY): Payer: Medicaid Other

## 2018-07-16 ENCOUNTER — Telehealth: Payer: Self-pay

## 2018-07-16 NOTE — Telephone Encounter (Signed)
Received a referral for PREP program from the AFIB clinic for Jane Arellano.  Left a VM asking her to return call regarding the program.

## 2018-07-24 ENCOUNTER — Ambulatory Visit (HOSPITAL_COMMUNITY): Payer: Medicaid Other

## 2018-07-25 ENCOUNTER — Telehealth: Payer: Self-pay

## 2018-07-25 NOTE — Telephone Encounter (Signed)
Left a VM for Jane Arellano to return my call in regards to the PREP at [facility]

## 2018-08-06 MED FILL — AMLODIPINE BESYLATE 10 MG T: 10 | 30 days supply | Qty: 30 | Fill #4

## 2018-08-06 MED FILL — FERROUS SULFATE 325 MG TAB: 325 (65 FE) | 30 days supply | Qty: 90 | Fill #2

## 2018-08-06 MED FILL — CARVEDILOL 3.125 MG TABLET: 3.125 | 30 days supply | Qty: 60 | Fill #3

## 2018-08-06 MED FILL — FAMOTIDINE 20 MG TABLET: 20 | 30 days supply | Qty: 60 | Fill #3

## 2018-08-06 MED FILL — FUROSEMIDE 20 MG TABLET: 20 | 30 days supply | Qty: 30 | Fill #4

## 2018-08-06 MED FILL — GABAPENTIN 300 MG CAPSULE: 300 | 30 days supply | Qty: 90 | Fill #1

## 2018-08-08 ENCOUNTER — Ambulatory Visit: Payer: Medicaid Other

## 2018-08-09 ENCOUNTER — Ambulatory Visit: Payer: Medicaid Other | Admitting: Family Medicine

## 2018-08-09 ENCOUNTER — Other Ambulatory Visit (HOSPITAL_COMMUNITY): Payer: Medicaid Other

## 2018-08-13 ENCOUNTER — Ambulatory Visit (HOSPITAL_COMMUNITY): Payer: Medicaid Other

## 2018-08-23 ENCOUNTER — Other Ambulatory Visit (HOSPITAL_COMMUNITY): Payer: Medicaid Other

## 2018-08-27 ENCOUNTER — Ambulatory Visit (HOSPITAL_COMMUNITY)
Admission: RE | Admit: 2018-08-27 | Discharge: 2018-08-27 | Disposition: A | Discharge status: Home / Self Care | Payer: Medicaid Other | Source: Ambulatory Visit | Attending: Nurse Practitioner | Admitting: Nurse Practitioner

## 2018-08-27 DIAGNOSIS — I480 Paroxysmal atrial fibrillation: Secondary | ICD-10-CM | POA: Diagnosis not present

## 2018-08-27 DIAGNOSIS — I088 Other rheumatic multiple valve diseases: Secondary | ICD-10-CM | POA: Diagnosis not present

## 2018-08-27 NOTE — Progress Notes (Signed)
  Echocardiogram 2D Echocardiogram has been performed.  Jane Arellano 08/27/2018, 11:39 AM

## 2018-08-28 ENCOUNTER — Ambulatory Visit: Payer: Medicaid Other | Admitting: Family Medicine

## 2018-08-31 ENCOUNTER — Encounter (HOSPITAL_COMMUNITY): Payer: Self-pay | Admitting: *Deleted

## 2018-09-03 MED FILL — PRADAXA 150 MG CAP: 150 | 30 days supply | Qty: 60 | Fill #3

## 2018-09-03 MED FILL — hydrOXYzine HCL 25 MG TABS: 25 | 20 days supply | Qty: 60 | Fill #1

## 2018-09-03 MED FILL — FUROSEMIDE 20 MG TABLET: 20 | 30 days supply | Qty: 30 | Fill #5

## 2018-09-03 MED FILL — AMLODIPINE BESYLATE 10 MG T: 10 | 30 days supply | Qty: 30 | Fill #5

## 2018-09-11 ENCOUNTER — Ambulatory Visit
Admission: RE | Admit: 2018-09-11 | Discharge: 2018-09-11 | Disposition: A | Discharge status: Home / Self Care | Payer: Medicaid Other | Source: Ambulatory Visit | Attending: Internal Medicine | Admitting: Internal Medicine

## 2018-09-11 DIAGNOSIS — Z1231 Encounter for screening mammogram for malignant neoplasm of breast: Secondary | ICD-10-CM | POA: Diagnosis not present

## 2018-09-18 ENCOUNTER — Ambulatory Visit: Payer: Medicaid Other | Admitting: Family Medicine

## 2018-09-24 MED FILL — FENOFIBRATE 145 MG TABLET: 145 | 30 days supply | Qty: 30 | Fill #3

## 2018-09-28 ENCOUNTER — Encounter: Payer: Self-pay | Admitting: Family Medicine

## 2018-09-28 ENCOUNTER — Ambulatory Visit: Payer: Medicaid Other | Attending: Family Medicine | Admitting: Family Medicine

## 2018-09-28 VITALS — BP 148/77 | HR 61 | Temp 97.7°F | Ht 67.0 in | Wt 191.6 lb

## 2018-09-28 DIAGNOSIS — R5383 Other fatigue: Secondary | ICD-10-CM | POA: Diagnosis not present

## 2018-09-28 DIAGNOSIS — Z833 Family history of diabetes mellitus: Secondary | ICD-10-CM | POA: Insufficient documentation

## 2018-09-28 DIAGNOSIS — Z7984 Long term (current) use of oral hypoglycemic drugs: Secondary | ICD-10-CM | POA: Diagnosis not present

## 2018-09-28 DIAGNOSIS — E1142 Type 2 diabetes mellitus with diabetic polyneuropathy: Secondary | ICD-10-CM | POA: Diagnosis not present

## 2018-09-28 DIAGNOSIS — E118 Type 2 diabetes mellitus with unspecified complications: Secondary | ICD-10-CM | POA: Diagnosis not present

## 2018-09-28 DIAGNOSIS — D509 Iron deficiency anemia, unspecified: Secondary | ICD-10-CM | POA: Diagnosis not present

## 2018-09-28 DIAGNOSIS — Z888 Allergy status to other drugs, medicaments and biological substances status: Secondary | ICD-10-CM | POA: Insufficient documentation

## 2018-09-28 DIAGNOSIS — Z8249 Family history of ischemic heart disease and other diseases of the circulatory system: Secondary | ICD-10-CM | POA: Diagnosis not present

## 2018-09-28 DIAGNOSIS — I119 Hypertensive heart disease without heart failure: Secondary | ICD-10-CM | POA: Insufficient documentation

## 2018-09-28 DIAGNOSIS — I10 Essential (primary) hypertension: Secondary | ICD-10-CM

## 2018-09-28 DIAGNOSIS — I480 Paroxysmal atrial fibrillation: Secondary | ICD-10-CM

## 2018-09-28 DIAGNOSIS — I5189 Other ill-defined heart diseases: Secondary | ICD-10-CM | POA: Diagnosis not present

## 2018-09-28 DIAGNOSIS — R011 Cardiac murmur, unspecified: Secondary | ICD-10-CM | POA: Insufficient documentation

## 2018-09-28 DIAGNOSIS — Z23 Encounter for immunization: Secondary | ICD-10-CM

## 2018-09-28 DIAGNOSIS — K219 Gastro-esophageal reflux disease without esophagitis: Secondary | ICD-10-CM | POA: Diagnosis not present

## 2018-09-28 DIAGNOSIS — E785 Hyperlipidemia, unspecified: Secondary | ICD-10-CM | POA: Diagnosis not present

## 2018-09-28 DIAGNOSIS — Z79899 Other long term (current) drug therapy: Secondary | ICD-10-CM

## 2018-09-28 DIAGNOSIS — Z7901 Long term (current) use of anticoagulants: Secondary | ICD-10-CM | POA: Diagnosis not present

## 2018-09-28 LAB — POCT GLYCOSYLATED HEMOGLOBIN (HGB A1C): HbA1c, POC (controlled diabetic range): 6.4 % (ref 0.0–7.0)

## 2018-09-28 LAB — GLUCOSE, POCT (MANUAL RESULT ENTRY): POC Glucose: 122 mg/dL — AB (ref 70–99)

## 2018-09-28 MED ORDER — FAMOTIDINE 20 MG PO TABS: 20.0000 mg | ORAL_TABLET | Freq: Two times a day (BID) | ORAL | 11 refills | Status: DC

## 2018-09-28 MED ORDER — AMITRIPTYLINE HCL 50 MG PO TABS: 50.0000 mg | ORAL_TABLET | Freq: Every day | ORAL | 4 refills | Status: DC

## 2018-09-28 MED ORDER — ACETAMINOPHEN-CODEINE #3 300-30 MG PO TABS: 1.0000 | ORAL_TABLET | Freq: Three times a day (TID) | ORAL | 0 refills | Status: AC | PRN

## 2018-09-28 MED ORDER — CARVEDILOL 3.125 MG PO TABS: 3.1250 mg | ORAL_TABLET | Freq: Two times a day (BID) | ORAL | 4 refills | Status: DC

## 2018-09-28 MED FILL — FAMOTIDINE 20 MG TABLET: 20 | 30 days supply | Qty: 60 | Fill #0

## 2018-09-28 MED FILL — ACETAMINOPHEN/COD #3 TABLET: 300-30 | 5 days supply | Qty: 15 | Fill #0

## 2018-09-28 MED FILL — CARVEDILOL 3.125 MG TABLET: 3.125 | 30 days supply | Qty: 60 | Fill #0

## 2018-09-28 NOTE — Progress Notes (Signed)
Subjective:    Patient ID: Jane Arellano, female    DOB: 05/17/1961, 57 y.o.   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      Patient reports that she is taking her diabetes medication, metformin on a daily basis.  Patient reports that she checks her blood sugars at least twice daily.  Patient states her blood sugars fasting are generally in the 130s.  Patient denies any urinary frequency, or increased thirst.  Patient states that she does have some occasional, painful numbness in the bottom of her feet.  Patient is requesting refill of Elavil at today's visit.  Patient also requests refill of Tylenol 3 which she states her prior primary care provider here will give her occasionally.  Patient reports that she is taking her blood pressure medication daily and denies any headaches or dizziness related to her blood pressure.  She also follows up with atrial fibrillation clinic and has noticed no sensation of palpitations.  Patient has had no episodes of focal numbness or weakness and no vision loss.  Patient was on Xarelto but developed bleeding in her gums and patient was switched to Pradaxa.  Patient has had no further abnormal bleeding.  Patient reports that she does need refill of Pepcid to help prevent reflux/stomach upset.  Patient did have a emergency department visit on review of chart on 07/07/2018 due to atypical chest pain.  patient denies any blood in the stools and no dark stools.  Patient reports that she has had her colonoscopy and will not need her next one until age 57.      Patient reports that she has recently had an  echocardiogram by cardiology.  On review of chart, patient had echo on 08/27/2018.  Patient reports no further issues with chest pain, no shortness of breath no leg swelling.  Patient does have some fatigue.  Patient states that she does need her pneumonia vaccine at today's visit.  Patient reports that she had her influenza immunization last month.  Patient has not eaten lunch but did eat earlier today.  Patient reports that she is taking medication for her cholesterol.  Patient denies any fever or chills.  Patient states that she occasionally gets headaches which are sometimes at the back of her head/neck or at her temples.  Patient states that these occur approximately 1-2 times per month and are not associated with nausea or photophobia.  Patient states that she was told that these are migraines and that in the past her prior provider gave her Tylenol 3 on occasion to take for these headaches. (Patient was made aware that Tylenol #3/narcotic medication is not usually recommended for treatment of headaches).  Patient denies any dizziness.  Patient would like to have testing for HIV at today's visit.  Patient states that she has had some fatigue and she likes to have this test done yearly.  Patient also has not had a yearly diabetic eye exam.  Patient denies any significant changes in her vision at this time. Past Medical History:  Diagnosis Date  . Atypical chest pain    a. 11/2005 Negative Myoview  . Diastolic dysfunction   .  DM2 (diabetes mellitus, type 2) (HCC)   . Dysrhythmia    AFIB HX CV   . GERD (gastroesophageal reflux disease)   . Heart murmur   . History of substance abuse (HCC)   . HTN (hypertension)   . Iron deficiency anemia   . Paroxysmal atrial fibrillation (HCC) 05/01/2013   On Xarelto  . Subaortic membrane    a. 01/2010 Echo: EF 55-60%, No rwma, subaortic membrane with elevated LVOT mean gradient of 21 mmHg, Triv AI, mod dil LA, mildly increased PASP. b. 05/02/2013 Echo:  LVEF  60-65%, grade 1 diastolic dysfunction, mild LVH, subaortic stenosis w/ turbulation in LVOT c/w subaortic membrane (mean gradient 42 mmHg/peak gradient 81 mmHg), mild biatrial enlargement   Past Surgical History:  Procedure Laterality Date  . CARDIOVERSION N/A 11/04/2013   Procedure: CARDIOVERSION;  Surgeon: Peter M Swaziland, MD;  Location: [facility] ENDOSCOPY;  Service: Cardiovascular;  Laterality: N/A;  . CESAREAN SECTION     Family History  Problem Relation Age of Onset  . Diabetes Father        deceased  . Cancer Mother 72       colon  . Stroke Mother   . Hypertension Sister        alive and well  . Hypertension Sister        alive and well  . Heart attack Brother        deceased @ 41   Social History   Tobacco Use  . Smoking status: Never Smoker  . Smokeless tobacco: Current User    Types: Snuff  Substance Use Topics  . Alcohol use: No    Comment: Drinks 3 - 40 oz beers daily.  i QUIT DOING THAT "  . Drug use: No    Types: Marijuana    Comment: Smokes marijuana weekly.  Has not used cocaine in 9 years.  At today's visit, patient states that she smoked for a few years between the age of [age].  Patient denies any use of alcohol and no drug use (this was not consistent with past social history per chart review) Allergies  Allergen Reactions  . Xarelto [Rivaroxaban] Swelling    Gums bled, headaches  . Ketorolac Tromethamine Other (See Comments)    Unknown reaction           Review of Systems  Constitutional: Positive for fatigue. Negative for chills and fever.  HENT: Negative for ear pain, sore throat and trouble swallowing.   Eyes: Negative for photophobia and visual disturbance.  Respiratory: Negative for cough and shortness of breath.   Cardiovascular: Negative for chest pain, palpitations and leg swelling.  Gastrointestinal: Negative for abdominal pain, blood in stool and nausea.  Endocrine: Negative for polydipsia, polyphagia and polyuria.  Genitourinary: Negative  for dysuria, frequency and hematuria.  Musculoskeletal: Negative for back pain and gait problem.  Neurological: Positive for numbness and headaches. Negative for dizziness.       Objective:   Physical Exam BP (!) 148/77   Pulse 61   Temp 97.7 F (36.5 C) (Oral)   Ht 5\' 7"  (1.702 m)   Wt 191 lb 9.6 oz (86.9 kg)   SpO2 99%   BMI 30.01 kg/m  Nurse's notes and vital signs reviewed General-well-nourished, well-developed female in no acute distress ENT-TMs gray, partially obscured by cerumen, nares with mild edema of the nasal turbinates, patient with mild posterior pharynx erythema Neck-supple, no lymphadenopathy, no thyromegaly, no carotid bruit Lungs-clear to auscultation bilaterally Cardiovascular-regular rate and rhythm with  occasional ectopics.  Patient also with a soft murmur Abdomen-soft, nontender Back-no CVA tenderness Extremities-no edema Neuro-cranial nerves II through XII grossly intact Diabetic foot exam- no active skin breakdown on the feet.  Patient with hammertoe deformities on both feet.  Patient with polish on the nails but some nails appear thickened.  Patient with some slightly thicker skin on the heels and balls of the feet and on monofilament exam, patient has absent sensation on the right heel and right ball of the foot and on the left ball of the foot.  Patient with 1+ dorsalis pedis and posterior tibial pulses.  Patient does not have any active skin breakdown on the feet.        Assessment & Plan:  1. Type 2 diabetes mellitus with complication, without long-term current use of insulin (HCC) Patient with random glucose of 122 and hemoglobin A1c of 6.4 at today's visit indicating that her blood sugars are very well controlled.  Patient denies any hypoglycemic episodes.  Patient will continue her current use of metformin, continue to monitor blood sugars.  Continue a low carbohydrate diet along with regular exercise as tolerated.  Patient will have lipid panel done  at today's visit as well as urine microalbumin/creatinine ratio, CMP and referral to ophthalmology.  Diabetic foot care was also discussed at today's visit. - POCT glucose (manual entry) - POCT glycosylated hemoglobin (Hb A1C) - Lipid panel - Microalbumin/Creatinine Ratio, Urine - Comprehensive metabolic panel - Ambulatory referral to Ophthalmology  2. Diabetic polyneuropathy associated with type 2 diabetes mellitus [facility]) Patient with complaint of painful diabetic polyneuropathy.  Patient is given short-term prescription for Tylenol 3 to take as needed for pain and refill of amitriptyline.  Patient will also have vitamin B12 level as she is on long-term metformin which can lower her vitamin B12 levels and low vitamin B12 can cause neuropathic symptoms - acetaminophen-codeine (TYLENOL #3) 300-30 MG tablet; Take 1 tablet by mouth every 8 (eight) hours as needed for up to 5 days for moderate pain.  Dispense: 15 tablet; Refill: 0 - amitriptyline (ELAVIL) 50 MG tablet; Take 1 tablet (50 mg total) by mouth at bedtime.  Dispense: 30 tablet; Refill: 4 - Vitamin B12  3. Essential hypertension Patient's blood pressure slightly above normal at today's visit.  Patient is encouraged to continue use of her current medications as per cardiology.  Patient requests refill of Coreg which is provided at today's visit.  Patient should have her blood pressure monitor periodically to make sure that her blood pressure is staying within goal of 130/80. - Microalbumin/Creatinine Ratio, Urine - carvedilol (COREG) 3.125 MG tablet; Take 1 tablet (3.125 mg total) by mouth 2 (two) times daily.  Dispense: 60 tablet; Refill: 4  4. Dyslipidemia Patient with history of dyslipidemia and patient will have lipid panel and CMP done at today's visit.  Patient is currently on fenofibrate as well as omega-3 fatty acid supplement and will be notified if the changes needed in this medication based on her lab results.  If patient's  triglycerides are now within normal, patient may be switched to statin medication. - Lipid panel - Comprehensive metabolic panel  5. Gastroesophageal reflux disease without esophagitis Patient reports reflux symptoms and she is also on long-term use of anticoagulant medication.  Prescription provided for Pepcid 20 mg twice daily.  Patient should also avoid known trigger foods and avoid late night eating - famotidine (PEPCID) 20 MG tablet; Take 1 tablet (20 mg total) by mouth 2 (two) times daily.  To reduce stomach acid  Dispense: 60 tablet; Refill: 11  6. PAF (paroxysmal atrial fibrillation) (HCC) Patient had been on Xarelto for anticoagulation however she developed gum bleeding on this medication and cardiology switched patient to Pradaxa.  Patient will have CBC in follow-up of use of anticoagulant. - CBC with Differential  7. Diastolic dysfunction Patient with history of diastolic dysfunction which has been seen on prior echocardiograms however on patient's most recent echocardiogram, patient with normal ventricular relaxation and patient's diastolic dysfunction appears to have resolved.  Patient should continue compliance with blood pressure medications for control of hypertension.  8. Iron deficiency anemia, unspecified iron deficiency anemia type Patient will have CBC in follow-up of iron deficiency anemia.  Patient currently takes ferrous sulfate 325 mg 3 times daily.  Patient may be able to reduce the dose of this medication if her anemia has improved. - CBC with Differential  9. Encounter for long-term (current) use of medications Patient will have CMP in follow-up of long-term use of medications, some of which are high risk including patient's amitriptyline, fenofibrate Lasix and gabapentin as patient's medications may cause changes in electrolytes or liver function enzymes - Comprehensive metabolic panel  10. Long term current use of anticoagulant Patient with long-term use of  anticoagulant and patient reports that she had recent issues with gum bleeding with use of Xarelto and has since been switched to Pradaxa.  Patient will have CBC done in follow-up. - CBC with Differential   11. Fatigue, unspecified type Patient with complaint of fatigue.  Patient will have CBC to look for anemia, CMP to look for electrolyte abnormality or liver function disorder.  Patient will also have testing for HIV.  12. Need for 23-polyvalent pneumococcal polysaccharide vaccine  patient with chronic medical conditions which place patient in immunocompromised status and patient will have PPV 23 at today's visit.  Patient was also provided with informational handout regarding the immunization.  An After Visit Summary was printed and given to the patient.  Return in about 4 years (around 09/28/2022) for DM-4 months; schedule well exam/physical.

## 2018-09-29 LAB — MICROALBUMIN / CREATININE URINE RATIO
Creatinine, Urine: 16.3 mg/dL
Microalb/Creat Ratio: <18.4 mg/g{creat} (ref 0.0–30.0)
Microalbumin, Urine: <3 ug/mL

## 2018-09-29 LAB — COMPREHENSIVE METABOLIC PANEL WITH GFR
ALT: 16 IU/L (ref 0–32)
AST: 17 IU/L (ref 0–40)
Albumin/Globulin Ratio: 1.7 (ref 1.2–2.2)
Albumin: 4.8 g/dL (ref 3.5–5.5)
Alkaline Phosphatase: 42 IU/L (ref 39–117)
BUN/Creatinine Ratio: 11 (ref 9–23)
BUN: 9 mg/dL (ref 6–24)
Bilirubin Total: 0.2 mg/dL (ref 0.0–1.2)
CO2: 23 mmol/L (ref 20–29)
Calcium: 10 mg/dL (ref 8.7–10.2)
Chloride: 104 mmol/L (ref 96–106)
Creatinine, Ser: 0.79 mg/dL (ref 0.57–1.00)
GFR calc Af Amer: 95 mL/min/1.73
GFR calc non Af Amer: 83 mL/min/1.73
Globulin, Total: 2.8 g/dL (ref 1.5–4.5)
Glucose: 127 mg/dL — ABNORMAL HIGH (ref 65–99)
Potassium: 3.7 mmol/L (ref 3.5–5.2)
Sodium: 143 mmol/L (ref 134–144)
Total Protein: 7.6 g/dL (ref 6.0–8.5)

## 2018-09-29 LAB — CBC WITH DIFFERENTIAL/PLATELET
Basophils Absolute: 0 x10E3/uL (ref 0.0–0.2)
Basos: 0 %
EOS (ABSOLUTE): 0 x10E3/uL (ref 0.0–0.4)
Eos: 1 %
Hematocrit: 36 % (ref 34.0–46.6)
Hemoglobin: 12.2 g/dL (ref 11.1–15.9)
Immature Grans (Abs): 0 x10E3/uL (ref 0.0–0.1)
Immature Granulocytes: 0 %
Lymphocytes Absolute: 1.3 x10E3/uL (ref 0.7–3.1)
Lymphs: 29 %
MCH: 32.9 pg (ref 26.6–33.0)
MCHC: 33.9 g/dL (ref 31.5–35.7)
MCV: 97 fL (ref 79–97)
Monocytes Absolute: 0.3 x10E3/uL (ref 0.1–0.9)
Monocytes: 6 %
Neutrophils Absolute: 2.8 x10E3/uL (ref 1.4–7.0)
Neutrophils: 64 %
Platelets: 206 x10E3/uL (ref 150–450)
RBC: 3.71 x10E6/uL — ABNORMAL LOW (ref 3.77–5.28)
RDW: 12.6 % (ref 12.3–15.4)
WBC: 4.5 x10E3/uL (ref 3.4–10.8)

## 2018-09-29 LAB — VITAMIN B12: Vitamin B-12: 623 pg/mL (ref 232–1245)

## 2018-09-29 LAB — LIPID PANEL
Chol/HDL Ratio: 2.5 ratio (ref 0.0–4.4)
Cholesterol, Total: 142 mg/dL (ref 100–199)
HDL: 57 mg/dL
LDL Calculated: 38 mg/dL (ref 0–99)
Triglycerides: 234 mg/dL — ABNORMAL HIGH (ref 0–149)
VLDL Cholesterol Cal: 47 mg/dL — ABNORMAL HIGH (ref 5–40)

## 2018-09-29 LAB — HIV ANTIBODY (ROUTINE TESTING W REFLEX): HIV Screen 4th Generation wRfx: NONREACTIVE

## 2018-10-03 ENCOUNTER — Telehealth: Payer: Self-pay | Admitting: *Deleted

## 2018-10-03 NOTE — Telephone Encounter (Signed)
Patient called back for results and nurse was unavailable. Please follow up

## 2018-10-03 NOTE — Telephone Encounter (Signed)
Left message on voicemail to return call:    Notes recorded by Cain SaupeFulp, Cammie, MD on 10/02/2018 at 10:33 PM EST Please notify patient that her vitamin B12 level is normal. Patient's lipid panel shows an increase in triglycerides which are fatty substance in the bloodstream. Patient's level is 234 and normal is 150 or less. Patient should follow a low-fat diet and consider medication to lower triglycerides if she is not already taking this type of medication. Patient with a normal urine microalbumin level. Patient CMP was normal with the exception of glucose of 127. Patient with a normal complete blood count. Patient's HIV test was negative/nonreactive

## 2018-10-04 ENCOUNTER — Other Ambulatory Visit: Payer: Self-pay | Admitting: Physician Assistant

## 2018-10-04 DIAGNOSIS — I10 Essential (primary) hypertension: Secondary | ICD-10-CM

## 2018-10-04 MED FILL — FERROUS SULFATE 325 MG TAB: 325 (65 FE) | 30 days supply | Qty: 90 | Fill #3

## 2018-10-04 MED FILL — GABAPENTIN 300 MG CAPSULE: 300 | 30 days supply | Qty: 90 | Fill #2

## 2018-10-04 MED FILL — AMLODIPINE BESYLATE 10 MG T: 10 | 30 days supply | Qty: 30 | Fill #6

## 2018-10-04 NOTE — Telephone Encounter (Signed)
Pt name and DOB verified. Pt aware of results. Instructed to pick up medication at [facility].

## 2018-10-05 ENCOUNTER — Telehealth (INDEPENDENT_AMBULATORY_CARE_PROVIDER_SITE_OTHER): Payer: Self-pay

## 2018-10-05 MED FILL — FUROSEMIDE 20 MG TABLET: 20 | 30 days supply | Qty: 30 | Fill #0

## 2018-10-05 NOTE — Telephone Encounter (Signed)
Patient had already been given results. She would like to know if she should take both fenofibrate and fish oil pills. Please advise. Maryjean Mornempestt S Roberts, CMA

## 2018-10-08 NOTE — Telephone Encounter (Signed)
It is ok for her to take both

## 2018-10-09 NOTE — Telephone Encounter (Addendum)
Left message on voicemail to return call. Attempt to contact patient to relay previous message from Dr. Jillyn HiddenFulp stating it is ok to take both the Fenofibrate and fish oil.

## 2018-10-10 ENCOUNTER — Telehealth: Payer: Self-pay | Admitting: Family Medicine

## 2018-10-10 NOTE — Telephone Encounter (Signed)
Patient returned nurse call and was informed of the nurse message.

## 2018-10-23 MED FILL — AMITRIPTYLINE HCL 50 MG TAB: 50 | 30 days supply | Qty: 30 | Fill #0

## 2018-10-23 MED FILL — FENOFIBRATE 145 MG TABLET: 145 | 30 days supply | Qty: 30 | Fill #4

## 2018-10-23 MED FILL — PRADAXA 150 MG CAP: 150 | 30 days supply | Qty: 60 | Fill #4

## 2018-10-24 ENCOUNTER — Encounter: Payer: Medicaid Other | Admitting: Family Medicine

## 2018-11-05 MED FILL — FUROSEMIDE 20 MG TABLET: 20 | 30 days supply | Qty: 30 | Fill #1

## 2018-11-05 MED FILL — FAMOTIDINE 20 MG TABLET: 20 | 30 days supply | Qty: 60 | Fill #1

## 2018-11-05 MED FILL — hydrOXYzine HCL 25 MG TABS: 25 | 20 days supply | Qty: 60 | Fill #2

## 2018-11-05 MED FILL — CARVEDILOL 3.125 MG TABLET: 3.125 | 30 days supply | Qty: 60 | Fill #1

## 2018-11-05 MED FILL — AMLODIPINE BESYLATE 10 MG T: 10 | 30 days supply | Qty: 30 | Fill #7

## 2018-11-09 ENCOUNTER — Encounter: Payer: Medicaid Other | Admitting: Family Medicine

## 2018-11-20 MED FILL — GABAPENTIN 300 MG CAPSULE: 300 | 30 days supply | Qty: 90 | Fill #3

## 2018-11-20 MED FILL — FENOFIBRATE 145 MG TABLET: 145 | 30 days supply | Qty: 30 | Fill #5

## 2018-11-20 MED FILL — metFORMIN HCL 1000 MG TABS: 1000 | 90 days supply | Qty: 180 | Fill #3

## 2018-11-28 MED FILL — PRADAXA 150 MG CAP: 150 | 30 days supply | Qty: 60 | Fill #5

## 2018-12-04 MED FILL — CARVEDILOL 3.125 MG TABLET: 3.125 | 30 days supply | Qty: 60 | Fill #2

## 2018-12-04 MED FILL — FUROSEMIDE 20 MG TABLET: 20 | 30 days supply | Qty: 30 | Fill #2

## 2018-12-04 MED FILL — AMITRIPTYLINE HCL 50 MG TAB: 50 | 30 days supply | Qty: 30 | Fill #1

## 2018-12-04 MED FILL — AMLODIPINE BESYLATE 10 MG T: 10 | 30 days supply | Qty: 30 | Fill #8

## 2018-12-13 ENCOUNTER — Ambulatory Visit: Payer: Medicaid Other | Attending: Family Medicine | Admitting: Family Medicine

## 2018-12-13 ENCOUNTER — Encounter: Payer: Self-pay | Admitting: Family Medicine

## 2018-12-13 VITALS — BP 144/77 | HR 61 | Temp 98.8°F | Resp 18 | Ht 67.0 in | Wt 183.0 lb

## 2018-12-13 DIAGNOSIS — H6692 Otitis media, unspecified, left ear: Secondary | ICD-10-CM

## 2018-12-13 DIAGNOSIS — E118 Type 2 diabetes mellitus with unspecified complications: Secondary | ICD-10-CM | POA: Diagnosis not present

## 2018-12-13 DIAGNOSIS — J0140 Acute pansinusitis, unspecified: Secondary | ICD-10-CM | POA: Diagnosis not present

## 2018-12-13 LAB — GLUCOSE, POCT (MANUAL RESULT ENTRY): POC Glucose: 148 mg/dL — AB (ref 70–99)

## 2018-12-13 MED ORDER — AMOXICILLIN-POT CLAVULANATE 500-125 MG PO TABS: 1.0000 | ORAL_TABLET | Freq: Two times a day (BID) | ORAL | 0 refills | Status: AC

## 2018-12-13 MED ORDER — CETIRIZINE HCL 10 MG PO TABS: 10.0000 mg | ORAL_TABLET | Freq: Every day | ORAL | 3 refills | Status: DC

## 2018-12-13 MED FILL — AMOX-CLAV 500-125 MG TABLET: 500-125 | 7 days supply | Qty: 14 | Fill #0

## 2018-12-13 MED FILL — FAMOTIDINE 20 MG TABLET: 20 | 30 days supply | Qty: 60 | Fill #2

## 2018-12-13 MED FILL — CETIRIZINE HCL 10 MG TABS: 10 | 30 days supply | Qty: 30 | Fill #0

## 2018-12-13 NOTE — Progress Notes (Signed)
Subjective:    Patient ID: Jane Arellano, female    DOB: 1961/02/06, 58 y.o.   MRN: [id number]  HPI       58 yo female who was originally scheduled for well exam but patient is sick at today's visit and wishes to reschedule her well exam.  She reports that she has had ongoing nasal congestion for about 2 weeks that has gotten worse and patient with postnasal drainage and occasional green nasal discharge.  Patient has also developed pressure in her cheeks and forehead.  Patient with 2 to 3-day history of pain in her left ear that is a dull pressure with occasional throbbing sensation.  Patient with an occasional mild nonproductive cough which she believes is related to postnasal drainage.  Patient has had no fever or chills.  Patient feels fatigued.  No abdominal pain, no nausea/vomiting or diarrhea. Blood sugars slightly higher over the last week but still feels that her DM is controlled.  Past Medical History:  Diagnosis Date  . Atypical chest pain    a. 11/2005 Negative Myoview  . Diastolic dysfunction   . DM2 (diabetes mellitus, type 2) (HCC)   . Dysrhythmia    AFIB HX CV   . GERD (gastroesophageal reflux disease)   . Heart murmur   . History of substance abuse (HCC)   . HTN (hypertension)   . Iron deficiency anemia   . Paroxysmal atrial fibrillation (HCC) 05/01/2013   On Xarelto  . Subaortic membrane    a. 01/2010 Echo: EF 55-60%, No rwma, subaortic membrane with elevated LVOT mean gradient of 21 mmHg, Triv AI, mod dil LA, mildly increased PASP. b. 05/02/2013 Echo:  LVEF 60-65%, grade 1 diastolic dysfunction, mild LVH, subaortic stenosis w/ turbulation in LVOT c/w subaortic membrane (mean gradient 42 mmHg/peak gradient 81 mmHg), mild biatrial enlargement   Past Surgical History:  Procedure Laterality Date  . CARDIOVERSION N/A 11/04/2013   Procedure: CARDIOVERSION;  Surgeon: Peter M Swaziland, MD;  Location: [facility] ENDOSCOPY;  Service: Cardiovascular;  Laterality: N/A;  . CESAREAN SECTION      Family History  Problem Relation Age of Onset  . Diabetes Father        deceased  . Cancer Mother 20       colon  . Stroke Mother   . Hypertension Sister        alive and well  . Hypertension Sister        alive and well  . Heart attack Brother        deceased @ 22   Social History   Tobacco Use  . Smoking status: Never Smoker  . Smokeless tobacco: Current User    Types: Snuff  Substance Use Topics  . Alcohol use: No    Comment: Drinks 3 - 40 oz beers daily.  i QUIT DOING THAT "  . Drug use: No    Types: Marijuana    Comment: Smokes marijuana weekly.  Has not used cocaine in 9 years.   Allergies  Allergen Reactions  . Xarelto [Rivaroxaban] Swelling    Gums bled, headaches  . Ketorolac Tromethamine Other (See Comments)    Unknown reaction      Review of Systems  Constitutional: Positive for fatigue. Negative for chills and fever.  HENT: Positive for congestion, ear pain, postnasal drip, rhinorrhea, sinus pressure and sinus pain. Negative for hearing loss, nosebleeds, sore throat and trouble swallowing.   Eyes: Negative for photophobia, pain, discharge, redness and visual disturbance.  Respiratory:  Positive for cough (mild and non-productive). Negative for shortness of breath.   Cardiovascular: Negative for chest pain and palpitations.  Gastrointestinal: Negative for abdominal pain, constipation, diarrhea and nausea.  Endocrine: Negative for polydipsia, polyphagia and polyuria.  Genitourinary: Negative for dysuria and frequency.  Musculoskeletal: Negative for arthralgias and gait problem.  Skin: Negative for rash and wound.  Neurological: Positive for headaches. Negative for dizziness and facial asymmetry.       Objective:   Physical Exam Vitals signs and nursing note reviewed.  Constitutional:      Appearance: Normal appearance. She is ill-appearing (appears fatigued and has a nasal/hoarse quality to her voice).  HENT:     Head: Normocephalic and  atraumatic.     Right Ear: Hearing, ear canal and external ear normal. Tympanic membrane is retracted (TM dull and slightly retracted).     Left Ear: Hearing, ear canal and external ear normal. Tympanic membrane is erythematous and retracted (mildly retracted; ear drum thickened and erythematous).     Nose: Mucosal edema, congestion and rhinorrhea (light greeen mucoid discharge within the nasal canals) present.     Right Turbinates: Swollen.     Left Turbinates: Swollen.     Right Sinus: Maxillary sinus tenderness and frontal sinus tenderness present.     Left Sinus: Maxillary sinus tenderness and frontal sinus tenderness present.  Neck:     Musculoskeletal: Normal range of motion and neck supple. Muscular tenderness (slightly tender left upper cervical lymph node) present.  Cardiovascular:     Rate and Rhythm: Normal rate and regular rhythm.  Pulmonary:     Effort: Pulmonary effort is normal.     Breath sounds: Normal breath sounds. No wheezing or rhonchi.  Abdominal:     Palpations: Abdomen is soft.     Tenderness: There is no abdominal tenderness. There is no right CVA tenderness, left CVA tenderness or guarding.  Musculoskeletal: Normal range of motion.        General: No tenderness.     Right lower leg: No edema.     Left lower leg: No edema.  Lymphadenopathy:     Cervical: Cervical adenopathy ( left upper cervical chain lymph node that is mildly enlarged and tender ) present.  Skin:    General: Skin is warm and dry.  Neurological:     General: No focal deficit present.     Mental Status: She is alert and oriented to person, place, and time.     Cranial Nerves: No cranial nerve deficit.  Psychiatric:        Mood and Affect: Mood normal.        Behavior: Behavior normal.    BP (!) 144/77 (BP Location: Left Arm, Patient Position: Sitting, Cuff Size: Normal)   Pulse 61   Temp 98.8 F (37.1 C) (Oral)   Resp 18   Ht 5\' 7"  (1.702 m)   Wt 183 lb (83 kg)   SpO2 100%   BMI 28.66  kg/m         Assessment & Plan:  1. Acute pansinusitis, recurrence not specified RX provided for Augmentin 500 mg twice daily for 7 days and patient should eat before taking the medication. Use of otc medication such as mucinex suggested. RX for generic zyrtec to help with nasal congestion and postnasal drainage.  - amoxicillin-clavulanate (AUGMENTIN) 500-125 MG tablet; Take 1 tablet (500 mg total) by mouth 2 (two) times daily for 7 days. Take after eating  Dispense: 14 tablet; Refill: 0 - cetirizine (  ZYRTEC) 10 MG tablet; Take 1 tablet (10 mg total) by mouth daily. As needed for nasal congestion  Dispense: 30 tablet; Refill: 3  2. Left otitis media, unspecified otitis media type RX for Augmentin 500 mg for coverage of otitis media and sinusitis. OTC pain medication as needed.  - amoxicillin-clavulanate (AUGMENTIN) 500-125 MG tablet; Take 1 tablet (500 mg total) by mouth 2 (two) times daily for 7 days. Take after eating  Dispense: 14 tablet; Refill: 0  3. Type 2 diabetes mellitus with complication, without long-term current use of insulin (HCC) Random glucose at 148 and patient made aware that blood sugars will at times be slightly above normal/elevated during illness/presence of infection.  - Glucose (CBG)  An After Visit Summary was printed and given to the patient.  Return if symptoms worsen or fail to improve, for reschedule well exam.

## 2018-12-17 MED FILL — FENOFIBRATE 145 MG TABLET: 145 | 30 days supply | Qty: 30 | Fill #6

## 2019-01-01 ENCOUNTER — Other Ambulatory Visit: Payer: Self-pay | Admitting: Physician Assistant

## 2019-01-01 ENCOUNTER — Other Ambulatory Visit: Payer: Self-pay | Admitting: Family Medicine

## 2019-01-01 DIAGNOSIS — E118 Type 2 diabetes mellitus with unspecified complications: Secondary | ICD-10-CM

## 2019-01-01 DIAGNOSIS — E1142 Type 2 diabetes mellitus with diabetic polyneuropathy: Secondary | ICD-10-CM

## 2019-01-01 DIAGNOSIS — I10 Essential (primary) hypertension: Secondary | ICD-10-CM

## 2019-01-01 MED FILL — AMLODIPINE BESYLATE 10 MG T: 10 | 30 days supply | Qty: 30 | Fill #9

## 2019-01-01 MED FILL — FUROSEMIDE 20 MG TABLET: 20 | 30 days supply | Qty: 30 | Fill #0

## 2019-01-02 MED FILL — GABAPENTIN 300 MG CAPSULE: 300 | 30 days supply | Qty: 90 | Fill #0

## 2019-01-02 MED FILL — PRADAXA 150 MG CAP: 150 | 30 days supply | Qty: 60 | Fill #0

## 2019-01-15 MED FILL — CARVEDILOL 3.125 MG TABLET: 3.125 | 30 days supply | Qty: 60 | Fill #3

## 2019-01-15 MED FILL — FENOFIBRATE 145 MG TABLET: 145 | 30 days supply | Qty: 30 | Fill #7

## 2019-01-28 MED FILL — FAMOTIDINE 20 MG TABLET: 20 | 30 days supply | Qty: 60 | Fill #3

## 2019-01-28 MED FILL — AMLODIPINE BESYLATE 10 MG T: 10 | 30 days supply | Qty: 30 | Fill #10

## 2019-01-28 MED FILL — PRADAXA 150 MG CAP: 150 | 30 days supply | Qty: 60 | Fill #1

## 2019-01-28 MED FILL — FUROSEMIDE 20 MG TABLET: 20 | 30 days supply | Qty: 30 | Fill #1

## 2019-02-12 ENCOUNTER — Other Ambulatory Visit: Payer: Self-pay | Admitting: Physician Assistant

## 2019-02-12 DIAGNOSIS — F418 Other specified anxiety disorders: Secondary | ICD-10-CM

## 2019-02-12 MED FILL — AMITRIPTYLINE HCL 50 MG TAB: 50 | 90 days supply | Qty: 90 | Fill #2

## 2019-02-12 MED FILL — FENOFIBRATE 145 MG TABLET: 145 | 60 days supply | Qty: 60 | Fill #8

## 2019-02-25 MED FILL — hydrOXYzine HCL 25 MG TABS: 25 | 20 days supply | Qty: 60 | Fill #0

## 2019-02-25 MED FILL — GABAPENTIN 300 MG CAPSULE: 300 | 30 days supply | Qty: 90 | Fill #1

## 2019-02-25 MED FILL — FUROSEMIDE 20 MG TABS: 20 | 30 days supply | Qty: 30 | Fill #2

## 2019-02-25 MED FILL — CARVEDILOL 3.125 MG TABLET: 3.125 | 30 days supply | Qty: 60 | Fill #4

## 2019-02-25 MED FILL — AMLODIPINE BESYLATE 10 MG T: 10 | 30 days supply | Qty: 30 | Fill #11

## 2019-02-25 MED FILL — metFORMIN HCL 1000 MG TABS: 1000 | 90 days supply | Qty: 180 | Fill #4

## 2019-03-19 MED FILL — PRADAXA 150 MG CAP: 150 | 30 days supply | Qty: 60 | Fill #2

## 2019-03-21 ENCOUNTER — Other Ambulatory Visit: Payer: Self-pay | Admitting: Family Medicine

## 2019-03-21 ENCOUNTER — Other Ambulatory Visit: Payer: Self-pay | Admitting: Pharmacist

## 2019-03-21 DIAGNOSIS — K219 Gastro-esophageal reflux disease without esophagitis: Secondary | ICD-10-CM

## 2019-03-21 MED ORDER — OMEPRAZOLE 20 MG PO CPDR: 20.0000 mg | DELAYED_RELEASE_CAPSULE | Freq: Every day | ORAL | 3 refills | Status: DC

## 2019-03-21 MED FILL — OMEPRAZOLE 20 MG CAP: 20 | 30 days supply | Qty: 30 | Fill #0

## 2019-03-21 NOTE — Progress Notes (Signed)
I sent in a prescription to [facility] pharmacy for Omeprazole in place of pepcid which is on back order

## 2019-03-21 NOTE — Progress Notes (Signed)
Patient ID: Jane Arellano, female   DOB: 1960-12-24, 58 y.o.   MRN: [id number]   Message received from the pharmacy.Patient's pepcid is on back order at the pharmacy therefore she needs a replacement. Omeprazole suggested as replacement and order will be placed

## 2019-03-21 NOTE — Progress Notes (Unsigned)
Patient requesting famotidine which is on backorder. Will route request for Prilosec to PCP.

## 2019-03-28 ENCOUNTER — Other Ambulatory Visit: Payer: Self-pay | Admitting: Physician Assistant

## 2019-03-28 ENCOUNTER — Other Ambulatory Visit: Payer: Self-pay | Admitting: Family Medicine

## 2019-03-28 DIAGNOSIS — I10 Essential (primary) hypertension: Secondary | ICD-10-CM

## 2019-03-28 MED FILL — FUROSEMIDE 20 MG TABS: 20 | 30 days supply | Qty: 30 | Fill #0

## 2019-03-28 MED FILL — AMLODIPINE BESYLATE 10 MG T: 10 | 90 days supply | Qty: 90 | Fill #0

## 2019-04-02 ENCOUNTER — Encounter (HOSPITAL_COMMUNITY): Payer: Self-pay

## 2019-04-02 ENCOUNTER — Ambulatory Visit (HOSPITAL_COMMUNITY)
Admission: EM | Admit: 2019-04-02 | Discharge: 2019-04-02 | Disposition: A | Discharge status: Home / Self Care | Payer: Medicaid Other | Attending: Family Medicine | Admitting: Family Medicine

## 2019-04-02 ENCOUNTER — Other Ambulatory Visit: Payer: Self-pay

## 2019-04-02 DIAGNOSIS — K219 Gastro-esophageal reflux disease without esophagitis: Secondary | ICD-10-CM | POA: Diagnosis not present

## 2019-04-02 DIAGNOSIS — R0789 Other chest pain: Secondary | ICD-10-CM | POA: Diagnosis not present

## 2019-04-02 DIAGNOSIS — S30860A Insect bite (nonvenomous) of lower back and pelvis, initial encounter: Secondary | ICD-10-CM | POA: Diagnosis not present

## 2019-04-02 DIAGNOSIS — R109 Unspecified abdominal pain: Secondary | ICD-10-CM | POA: Diagnosis not present

## 2019-04-02 DIAGNOSIS — M7918 Myalgia, other site: Secondary | ICD-10-CM

## 2019-04-02 DIAGNOSIS — T63441A Toxic effect of venom of bees, accidental (unintentional), initial encounter: Secondary | ICD-10-CM | POA: Diagnosis not present

## 2019-04-02 MED ORDER — ALUM & MAG HYDROXIDE-SIMETH 200-200-20 MG/5ML PO SUSP: 15.0000 mL | Freq: Four times a day (QID) | ORAL | 0 refills | Status: DC | PRN

## 2019-04-02 MED ORDER — ALUM & MAG HYDROXIDE-SIMETH 200-200-20 MG/5ML PO SUSP
ORAL | Status: AC
  Filled 2019-04-02: qty 30

## 2019-04-02 MED ORDER — LIDOCAINE VISCOUS HCL 2 % MT SOLN
OROMUCOSAL | Status: AC
  Filled 2019-04-02: qty 15

## 2019-04-02 MED ORDER — ALUM & MAG HYDROXIDE-SIMETH 200-200-20 MG/5ML PO SUSP
30.0000 mL | Freq: Once | ORAL | Status: AC
  Administered 2019-04-02: 30 mL via ORAL

## 2019-04-02 MED ORDER — LIDOCAINE VISCOUS HCL 2 % MT SOLN
15.0000 mL | Freq: Once | OROMUCOSAL | Status: AC
  Administered 2019-04-02: 15 mL via ORAL

## 2019-04-02 NOTE — ED Triage Notes (Signed)
Pt states her acid reflux med has been change this week.It was change to omeperazole 20 mg. Pt states she was stung by a bee 3 days ago on her left buttock. Pt states she's sore and achy.

## 2019-04-02 NOTE — ED Provider Notes (Signed)
[facility]    CSN: [id number] Arrival date & time: 04/02/19  1019     History   Chief Complaint Chief Complaint  Patient presents with   Gastroesophageal Reflux    HPI Jane Arellano is a 58 y.o. female history of hypertension, DM type II, GERD, paroxysmal A. fib, presenting today for evaluation of worsening reflux and bee sting.  Patient states that over the past week she has noticed her reflux symptoms worsening.  She was recently switched from Pepcid to omeprazole.  She has been taking the omeprazole for approximately 4 days.  She has had increased sensation of burning in her upper abdomen and chest.  Worse at nighttime and with lying flat.  Does note that she has had a lot of lemon water, but overall has been trying to avoid acidic foods.  Denies any shortness of breath, cough.  She has also noticed with switching to omeprazole she has had increased headaches.  Denies vision changes.  Denies weakness.  She is also is concerned about bee sting.  She states that on Sunday she accidentally sat on a bee and felt a stinging.  Since she has had a slight discomfort in her left buttock.  She has used hydrogen peroxide and alcohol in the area.  Has had mild pain.  Denies changes in bowel movements.  HPI  Past Medical History:  Diagnosis Date   Atypical chest pain    a. 11/2005 Negative Myoview   Diastolic dysfunction    DM2 (diabetes mellitus, type 2) (HCC)    Dysrhythmia    AFIB HX CV    GERD (gastroesophageal reflux disease)    Heart murmur    History of substance abuse (HCC)    HTN (hypertension)    Iron deficiency anemia    Paroxysmal atrial fibrillation (HCC) 05/01/2013   On Xarelto   Subaortic membrane    a. 01/2010 Echo: EF 55-60%, No rwma, subaortic membrane with elevated LVOT mean gradient of 21 mmHg, Triv AI, mod dil LA, mildly increased PASP. b. 05/02/2013 Echo:  LVEF 35-46%, grade 1 diastolic dysfunction, mild LVH, subaortic stenosis w/ turbulation  in LVOT c/w subaortic membrane (mean gradient 42 mmHg/peak gradient 81 mmHg), mild biatrial enlargement    Patient Active Problem List   Diagnosis Date Noted   Dyslipidemia 10/25/2017   Pap smear for cervical cancer screening 08/18/2016   Diabetic polyneuropathy associated with type 2 diabetes mellitus ([place]) 01/14/2016   Diabetic eye exam ([place]) 01/14/2016   Screening for colorectal cancer 01/14/2016   Neck pain 06/29/2015   Type 2 diabetes mellitus with complication, without long-term current use of insulin ([place]) 03/16/2015   Essential hypertension 03/16/2015   BV (bacterial vaginosis) 12/11/2014   Screening for colon cancer 12/10/2014   Keloid scar 12/10/2014   Screening for STD (sexually transmitted disease) 12/10/2014   Onychomycosis of toenail 12/10/2014   Cerumen impaction 12/10/2014   Diabetes mellitus type 2 with neurological manifestations ([place]) 08/14/2014   Blurry vision, bilateral 08/14/2014   PAF (paroxysmal atrial fibrillation) ([place]) 05/08/2013   ETOH abuse 05/02/2013   Marijuana abuse 56/81/2751   Diastolic dysfunction 70/11/7492   Depression with anxiety 05/01/2013   Subaortic membrane    HTN (hypertension)    DEPENDENCE, COCAINE, 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      Home Medications    Prior to Admission medications   Medication Sig Start Date End Date Taking? Authorizing Provider  ACCU-CHEK FASTCLIX LANCETS MISC USE AS DIRECTED 06/22/18   Charlott Rakes, MD  alum & mag hydroxide-simeth (MAALOX/MYLANTA) 200-200-20 MG/5ML suspension Take 15 mLs by mouth every 6 (six) hours as needed for indigestion or heartburn. 04/02/19   Jehan Bonano C, PA-C    amitriptyline (ELAVIL) 50 MG tablet Take 1 tablet (50 mg total) by mouth at bedtime. 09/28/18   Fulp, Cammie, MD  amLODipine (NORVASC) 10 MG tablet TAKE 1 TABLET BY MOUTH DAILY. 03/28/19   Fulp, Cammie, MD  Blood Glucose Calibration (ACCU-CHEK AVIVA) SOLN USE AS INSTRUCTED 01/11/16   Tresa Garter, MD  Blood Glucose Monitoring Suppl (ACCU-CHEK AVIVA PLUS) w/Device KIT USE AS DIRECTED TWICE DAILY AT 8 AM AND 10 PM 01/04/16   Jegede, Marlena Clipper, MD  Calcium Carb-Cholecalciferol (CALCIUM-VITAMIN D3) 500-400 MG-UNIT TABS Take 1 tablet by mouth daily. 04/11/18   Argentina Donovan, PA-C  carvedilol (COREG) 3.125 MG tablet Take 1 tablet (3.125 mg total) by mouth 2 (two) times daily. 09/28/18   Fulp, Cammie, MD  cetirizine (ZYRTEC) 10 MG tablet Take 1 tablet (10 mg total) by mouth daily. As needed for nasal congestion 12/13/18   Fulp, Cammie, MD  fenofibrate (TRICOR) 145 MG tablet Take 1 tablet (145 mg total) by mouth daily. 04/11/18   Argentina Donovan, PA-C  ferrous sulfate 325 (65 FE) MG tablet Take 1 tablet (325 mg total) by mouth 3 (three) times daily with meals. 04/11/18   Argentina Donovan, PA-C  furosemide (LASIX) 20 MG tablet TAKE 1 TABLET BY MOUTH DAILY. 03/28/19   Fulp, Cammie, MD  gabapentin (NEURONTIN) 300 MG capsule TAKE 1 CAPSULE BY MOUTH 3 TIMES DAILY. 01/01/19   Fulp, Ander Gaster, MD  GAVILAX powder MIX 1 CAPFUL WITH LIQUID AND DRINK ONCE DAILY 02/05/16   Chari Manning A, NP  glucose blood (ACCU-CHEK AVIVA PLUS) test strip USE AS INSTRUCTED 06/20/18   Charlott Rakes, MD  hydrOXYzine (ATARAX/VISTARIL) 25 MG tablet TAKE 1 TABLET BY MOUTH 3 TIMES DAILY AS NEEDED FOR ANXIETY. 02/12/19   Fulp, Cammie, MD  metFORMIN (GLUCOPHAGE) 1000 MG tablet Take 1 tablet (1,000 mg total) by mouth 2 (two) times daily with a meal. 04/11/18   McClung, Dionne Bucy, PA-C  Multiple Vitamin (MULTIVITAMIN WITH MINERALS) TABS tablet Take 1 tablet by mouth daily.    [provider]  Omega-3 Fatty Acids (FISH OIL) 1000 MG  CPDR Take 3 g by mouth daily. 04/11/18   Argentina Donovan, PA-C  omeprazole (PRILOSEC) 20 MG capsule Take 1 capsule (20 mg total) by mouth daily. To reduce stomach acid 03/21/19   Fulp, Cammie, MD  PRADAXA 150 MG CAPS capsule TAKE 1 CAPSULE BY MOUTH 2 TIMES DAILY. 01/01/19   Fulp, Cammie, MD  potassium chloride (K-DUR,KLOR-CON) 10 MEQ tablet Take 1 tablet (10 mEq total) by mouth daily. 11/06/13 12/05/13  Tresa Garter, MD    Family History Family History  Problem Relation Age of Onset   Diabetes Father        deceased   Cancer Mother 47       colon   Stroke Mother    Hypertension Sister        alive and well   Hypertension Sister        alive  and well   Heart attack Brother        deceased @ 7    Social History Social History   Tobacco Use   Smoking status: Never Smoker   Smokeless tobacco: Current User    Types: Snuff  Substance Use Topics   Alcohol use: No    Comment: Drinks 3 - 40 oz beers daily.  i QUIT DOING THAT "   Drug use: No    Types: Marijuana    Comment: Smokes marijuana weekly.  Has not used cocaine in 9 years.     Allergies   Xarelto [rivaroxaban] and Ketorolac tromethamine   Review of Systems Review of Systems  Constitutional: Negative for fatigue and fever.  HENT: Negative for congestion, sinus pressure and sore throat.   Eyes: Negative for photophobia, pain and visual disturbance.  Respiratory: Negative for cough and shortness of breath.   Cardiovascular: Positive for chest pain.  Gastrointestinal: Positive for abdominal pain. Negative for nausea and vomiting.  Genitourinary: Negative for decreased urine volume and hematuria.  Musculoskeletal: Negative for myalgias, neck pain and neck stiffness.  Skin: Negative for color change, pallor and wound.  Neurological: Positive for headaches. Negative for dizziness, syncope, facial asymmetry, speech difficulty, weakness, light-headedness and numbness.     Physical Exam Triage Vital  Signs ED Triage Vitals  Enc Vitals Group     BP 04/02/19 1040 122/76     Pulse Rate 04/02/19 1040 68     Resp 04/02/19 1040 18     Temp 04/02/19 1040 98.1 F (36.7 C)     Temp Source 04/02/19 1040 Oral     SpO2 04/02/19 1040 100 %     Weight 04/02/19 1039 190 lb (86.2 kg)     Height --      Head Circumference --      Peak Flow --      Pain Score 04/02/19 1039 7     Pain Loc --      Pain Edu? --      Excl. in [place]? --    No data found.  Updated Vital Signs BP 122/76 (BP Location: Right Arm)    Pulse 68    Temp 98.1 F (36.7 C) (Oral)    Resp 18    Wt 190 lb (86.2 kg)    SpO2 100%    BMI 29.76 kg/m   Visual Acuity Right Eye Distance:   Left Eye Distance:   Bilateral Distance:    Right Eye Near:   Left Eye Near:    Bilateral Near:     Physical Exam Vitals signs and nursing note reviewed.  Constitutional:      General: She is not in acute distress.    Appearance: She is well-developed.  HENT:     Head: Normocephalic and atraumatic.     Mouth/Throat:     Comments: Oral mucosa pink and moist, no tonsillar enlargement or exudate. Posterior pharynx patent and nonerythematous, no uvula deviation or swelling. Normal phonation.  Eyes:     Extraocular Movements: Extraocular movements intact.     Conjunctiva/sclera: Conjunctivae normal.     Pupils: Pupils are equal, round, and reactive to light.  Neck:     Musculoskeletal: Neck supple.  Cardiovascular:     Rate and Rhythm: Normal rate and regular rhythm.     Heart sounds: Murmur (Holosystolic) present.  Pulmonary:     Effort: Pulmonary effort is normal. No respiratory distress.     Breath sounds: Normal breath sounds.  Comments: Breathing comfortably at rest, CTABL, no wheezing, rales or other adventitious sounds auscultated Abdominal:     Palpations: Abdomen is soft.     Tenderness: There is no abdominal tenderness.     Comments: Nontender to light and deep palpation throughout abdomen  Skin:    General: Skin is  warm and dry.     Comments: Left buttock with no sign of rash, or swelling.  Very small 0.2 cm area of erythema with central punctate.  No palpable induration or fluctuance.  Neurological:     Mental Status: She is alert.      UC Treatments / Results  Labs (all labs ordered are listed, but only abnormal results are displayed) Labs Reviewed - No data to display  EKG None  Radiology No results found.  Procedures Procedures (including critical care time)  Medications Ordered in UC Medications  alum & mag hydroxide-simeth (MAALOX/MYLANTA) 200-200-20 MG/5ML suspension 30 mL (30 mLs Oral Given 04/02/19 1111)    And  lidocaine (XYLOCAINE) 2 % viscous mouth solution 15 mL (15 mLs Oral Given 04/02/19 1111)    Initial Impression / Assessment and Plan / UC Course  I have reviewed the triage vital signs and the nursing notes.  Pertinent labs & imaging results that were available during my care of the patient were reviewed by me and considered in my medical decision making (see chart for details).     EKG sinus bradycardia at a with PAC, QRS slightly widened, suggestive of possible underlying bundle branch.  These were present on previous EKG.  No acute changes, no acute signs of ischemia or infarction.  Symptoms improved with GI cocktail.  Most likely omeprazole subtherapeutic as he is only been taking this a couple days.  Possible need an increase in dose.  Will provide Maalox to use supplemently in the interim.  Continue omeprazole dose, contact PCP if reflux still not controlled with consistent use in 1 to 2 weeks, may need increasing dose or discussion regarding alternative medicine if still causing significant headaches.  Tylenol for headache in the meantime.  No neuro deficits.  Small area noted of likely bee sting, no surrounding erythema, no sign of allergic reaction or associated infection.  Discussed strict return precautions. Patient verbalized understanding and is agreeable  with plan.  Final Clinical Impressions(s) / UC Diagnoses   Final diagnoses:  Gastroesophageal reflux disease, esophagitis presence not specified  Bee sting, accidental or unintentional, initial encounter     Discharge Instructions     No sign of infection or allergic reaction around area on buttock from bee sting  Please continue taking omeprazole consistently for 2 weeks, if still having issues with reflux, please discuss with your primary care for change in dose/medicine.  If still persistently having headaches with taking omeprazole please also discuss this with your primary care.  May take Tylenol in the meantime for this.  May supplement with Maalox as needed for reflux  Continue to avoid acid triggering foods Remain seated upright 30 minutes to an hour after ingesting food.  Please follow-up if developing chest pain, shortness of breath, abdominal pain, fevers, vomiting, dizziness or lightheadedness    ED Prescriptions    Medication Sig Dispense Auth. Provider   alum & mag hydroxide-simeth (MAALOX/MYLANTA) 200-200-20 MG/5ML suspension Take 15 mLs by mouth every 6 (six) hours as needed for indigestion or heartburn. 355 mL Brianah Hopson C, PA-C     Controlled Substance Prescriptions [place] Controlled Substance Registry consulted? Not Applicable  Janith Lima, PA-C 04/02/19 1234

## 2019-04-02 NOTE — Discharge Instructions (Addendum)
No sign of infection or allergic reaction around area on buttock from bee sting  Please continue taking omeprazole consistently for 2 weeks, if still having issues with reflux, please discuss with your primary care for change in dose/medicine.  If still persistently having headaches with taking omeprazole please also discuss this with your primary care.  May take Tylenol in the meantime for this.  May supplement with Maalox as needed for reflux  Continue to avoid acid triggering foods Remain seated upright 30 minutes to an hour after ingesting food.  Please follow-up if developing chest pain, shortness of breath, abdominal pain, fevers, vomiting, dizziness or lightheadedness

## 2019-04-03 ENCOUNTER — Ambulatory Visit (HOSPITAL_COMMUNITY)
Admission: EM | Admit: 2019-04-03 | Discharge: 2019-04-03 | Disposition: A | Discharge status: Home / Self Care | Payer: Medicaid Other | Attending: Family Medicine | Admitting: Family Medicine

## 2019-04-03 ENCOUNTER — Telehealth: Payer: Self-pay | Admitting: Family Medicine

## 2019-04-03 ENCOUNTER — Other Ambulatory Visit: Payer: Self-pay

## 2019-04-03 ENCOUNTER — Encounter (HOSPITAL_COMMUNITY): Payer: Self-pay

## 2019-04-03 DIAGNOSIS — R1013 Epigastric pain: Secondary | ICD-10-CM | POA: Diagnosis not present

## 2019-04-03 DIAGNOSIS — K219 Gastro-esophageal reflux disease without esophagitis: Secondary | ICD-10-CM

## 2019-04-03 DIAGNOSIS — R072 Precordial pain: Secondary | ICD-10-CM

## 2019-04-03 MED ORDER — ALUM & MAG HYDROXIDE-SIMETH 200-200-20 MG/5ML PO SUSP
30.0000 mL | Freq: Once | ORAL | Status: AC
  Administered 2019-04-03: 30 mL via ORAL

## 2019-04-03 MED ORDER — LIDOCAINE VISCOUS HCL 2 % MT SOLN
15.0000 mL | Freq: Once | OROMUCOSAL | Status: AC
  Administered 2019-04-03: 15 mL via ORAL

## 2019-04-03 MED ORDER — OMEPRAZOLE 20 MG PO CPDR: 20.0000 mg | DELAYED_RELEASE_CAPSULE | Freq: Two times a day (BID) | ORAL | 1 refills | Status: DC

## 2019-04-03 MED ORDER — ALUM & MAG HYDROXIDE-SIMETH 200-200-20 MG/5ML PO SUSP
ORAL | Status: AC
  Filled 2019-04-03: qty 30

## 2019-04-03 MED ORDER — LIDOCAINE VISCOUS HCL 2 % MT SOLN
OROMUCOSAL | Status: AC
  Filled 2019-04-03: qty 15

## 2019-04-03 MED ORDER — SUCRALFATE 1 G PO TABS: 1.0000 g | ORAL_TABLET | Freq: Three times a day (TID) | ORAL | 0 refills | Status: DC

## 2019-04-03 NOTE — Telephone Encounter (Signed)
Patient with complaint of her acid reflux acting up. (Patient's speech is slightly slurred). Per patient she was seen at urgent care yesterday and today. Per patient her omeprazole was increased yesterday and today she was given sulcrafate to take 4 times per day. She reports that she has not slept in the past 4 days due to her mid-chest pain.  She would like to have some Tylenol #3 to help with her chest pain as regular Tylenol is not helping. She was encouraged to take the medications prescribed as per urgent care and that GI referral will be placed. She is also encouraged to try extra strength Tylenol as needed for pain and benadryl tonight to help with sleep. No narcotic medication prescribed,

## 2019-04-03 NOTE — ED Triage Notes (Signed)
Pt presents with complaints of worsening acid reflux. Reports being seen here yesterday. She has been on omeprazole with some relief but not total relief. Pt reports history of same. Denies any other symptoms.

## 2019-04-03 NOTE — Telephone Encounter (Signed)
New Message   Pt states she went to the er for acid reflux but the medication she was prescribed is not working. Tried to schedule an appt for 5/15 but pt refused and request to speak the doctor because she hasn't had any sleep due to symptoms. Please call

## 2019-04-03 NOTE — ED Provider Notes (Signed)
[facility]ARE [facility]ENTER   [id number] 04/03/19 Arrival Time: 1014  ASSESSMENT & PLAN:  1. Dyspepsia   2. Gastroesophageal reflux disease without esophagitis    Benign abdominal exam. No indications for urgent abdominal/pelvic imaging at this time. Discussed. Some improvement with GI cocktail at this visit.  Meds ordered this encounter  Medications  . AND Linked Order Group   . alum & mag hydroxide-simeth (MAALOX/MYLANTA) 200-200-20 MG/5ML suspension 30 mL   . lidocaine (XYLOCAINE) 2 % viscous mouth solution 15 mL  . omeprazole (PRILOSEC) 20 MG capsule    Sig: Take 1 capsule (20 mg total) by mouth 2 (two) times a day. To reduce stomach acid    Dispense:  60 capsule    Refill:  1    Replacement for pepcid which is currently on back order  . sucralfate (CARAFATE) 1 g tablet    Sig: Take 1 tablet (1 g total) by mouth 4 (four) times daily -  with meals and at bedtime.    Dispense:  28 tablet    Refill:  0    Follow-up Information    Schedule an appointment as soon as possible for a visit  with Cain Saupe, MD.   Specialty:  Family Medicine Contact information: [address] [place] [address] [phone]        [facility] EMERGENCY DEPARTMENT.   Specialty:  Emergency Medicine Why:  If your symptoms worsen. Contact information: [address] [id number] mc [place] [address] [phone]         Reviewed expectations re: course of current medical issues. Questions answered. Outlined signs and symptoms indicating need for more acute intervention. Patient verbalized understanding. After Visit Summary given.   SUBJECTIVE: History from: patient. Jane Arellano is a 58 y.o. female who presents with complaint of intermittent epigastric abdominal discomfort. Seen here yesterday; note reviewed. Onset gradual, over the past few weeks; h/o similar; "acid related". Discomfort described as burning; without radiation.  Symptoms are unchanged since beginning. Fever: absent. Aggravating factors: include certain foods or PO intake in general. Alleviating factors: have not been identified. Associated symptoms: mild fatige. She denies arthralgias, chills, constipation, diarrhea, dysuria, fever, headache, nausea, sweats and vomiting. Appetite: normal. PO intake: slightly decreased. Ambulatory without assistance. Urinary symptoms: none. Bowel movements: have not significantly changed; last bowel movement within the past 1-2 days and without blood. Has been on acid-reducing medications in the past; have helped. OTC treatment: none reported.  No LMP recorded. Patient is postmenopausal.   Past Surgical History:  Procedure Laterality Date  . CARDIOVERSION N/A 11/04/2013   Procedure: CARDIOVERSION;  Surgeon: Peter M [place]az[place], MD;  [facility] ENDOSCOPY;  Service: Cardiovascular;  Laterality: N/A;  . CESAREAN SECTION     ROS: As per HPI. All other systems negative.  OBJECTIVE:  Vitals:   04/03/19 1057  BP: 127/79  Pulse: 87  Resp: 18  Temp: 97.6 F (36.4 C)  SpO2: 98%    General appearance: alert, oriented, no acute distress Lungs: clear to auscultation bilaterally; unlabored respirations Heart: regular rate and rhythm Abdomen: soft; without distention; no tenderness; normal bowel sounds; without masses or organomegaly; without guarding or rebound tenderness Back: without CVA tenderness; FROM at waist Extremities: without LE edema; symmetrical; without gross deformities Skin: warm and dry Neurologic: normal gait Psychological: alert and cooperative; normal mood and affect  Allergies  Allergen Reactions  . Xarelto [Rivaroxaban] Swelling    Gums bled, headaches  . Ketorolac Tromethamine Other (See Comments)  Unknown reaction                                               Past Medical History:  Diagnosis Date  . Atypical chest pain    a. 11/2005 Negative Myoview  . Diastolic dysfunction   . DM2  (diabetes mellitus, type 2) (HCC)   . Dysrhythmia    AFIB HX CV   . GERD (gastroesophageal reflux disease)   . Heart murmur   . History of substance abuse (HCC)   . HTN (hypertension)   . Iron deficiency anemia   . Paroxysmal atrial fibrillation (HCC) 05/01/2013   On Xarelto  . Subaortic membrane    a. 01/2010 Echo: EF 55-60%, No rwma, subaortic membrane with elevated LVOT mean gradient of 21 mmHg, Triv AI, mod dil LA, mildly increased PASP. b. 05/02/2013 Echo:  LVEF 60-65%, grade 1 diastolic dysfunction, mild LVH, subaortic stenosis w/ turbulation in LVOT c/w subaortic membrane (mean gradient 42 mmHg/peak gradient 81 mmHg), mild biatrial enlargement   Social History   Socioeconomic History  . Marital status: Single    Spouse name: Not on file  . Number of children: Not on file  . Years of education: Not on file  . Highest education level: Not on file  Occupational History  . Not on file  Social Needs  . Financial resource strain: Not on file  . Food insecurity:    Worry: Not on file    Inability: Not on file  . Transportation needs:    Medical: Not on file    Non-medical: Not on file  Tobacco Use  . Smoking status: Never Smoker  . Smokeless tobacco: Current User    Types: Snuff  Substance and Sexual Activity  . Alcohol use: No    Comment: Drinks 3 - 40 oz beers daily.  i QUIT DOING THAT "  . Drug use: No    Types: Marijuana    Comment: Smokes marijuana weekly.  Has not used cocaine in 9 years.  . Sexual activity: Yes    Birth control/protection: None  Lifestyle  . Physical activity:    Days per week: Not on file    Minutes per session: Not on file  . Stress: Not on file  Relationships  . Social connections:    Talks on phone: Not on file    Gets together: Not on file    Attends religious service: Not on file    Active member of club or organization: Not on file    Attends meetings of clubs or organizations: Not on file    Relationship status: Not on file  .  Intimate partner violence:    Fear of current or ex partner: Not on file    Emotionally abused: Not on file    Physically abused: Not on file    Forced sexual activity: Not on file  Other Topics Concern  . Not on file  Social History Narrative   Lives in [place] by herself.  She had been caring for her mother but she died 2 mos ago.  She tries to remain active but does not regularly exercise.   Family History  Problem Relation Age of Onset  . Diabetes Father        deceased  . Cancer Mother 26       colon  . Stroke Mother   .  Hypertension Sister        alive and well  . Hypertension Sister        alive and well  . Heart attack Brother        deceased @ [address]     Sherril Shipman, [place], MD 04/09/19 [phone]

## 2019-04-05 DIAGNOSIS — R1013 Epigastric pain: Secondary | ICD-10-CM | POA: Diagnosis not present

## 2019-04-05 DIAGNOSIS — Z800 Family history of malignant neoplasm of digestive organs: Secondary | ICD-10-CM | POA: Diagnosis not present

## 2019-04-09 ENCOUNTER — Other Ambulatory Visit: Payer: Self-pay | Admitting: Family Medicine

## 2019-04-09 DIAGNOSIS — I10 Essential (primary) hypertension: Secondary | ICD-10-CM

## 2019-04-09 DIAGNOSIS — E1142 Type 2 diabetes mellitus with diabetic polyneuropathy: Secondary | ICD-10-CM

## 2019-04-09 MED FILL — hydrOXYzine HCL 25 MG TABS: 25 | 20 days supply | Qty: 60 | Fill #1

## 2019-04-10 MED FILL — CARVEDILOL 3.125 MG TABLET: 3.125 | 30 days supply | Qty: 60 | Fill #0

## 2019-04-11 ENCOUNTER — Inpatient Hospital Stay: Payer: Medicaid Other | Admitting: Family Medicine

## 2019-04-22 ENCOUNTER — Other Ambulatory Visit: Payer: Self-pay | Admitting: Physician Assistant

## 2019-04-22 DIAGNOSIS — E785 Hyperlipidemia, unspecified: Secondary | ICD-10-CM

## 2019-04-22 MED FILL — FENOFIBRATE 145 MG TABS: 145 | 30 days supply | Qty: 30 | Fill #0

## 2019-04-22 MED FILL — FUROSEMIDE 20 MG TABS: 20 | 30 days supply | Qty: 30 | Fill #1

## 2019-04-24 ENCOUNTER — Ambulatory Visit: Payer: Medicaid Other | Attending: Family Medicine | Admitting: Family Medicine

## 2019-04-24 ENCOUNTER — Other Ambulatory Visit: Payer: Self-pay

## 2019-04-24 ENCOUNTER — Encounter: Payer: Self-pay | Admitting: Family Medicine

## 2019-04-24 DIAGNOSIS — K219 Gastro-esophageal reflux disease without esophagitis: Secondary | ICD-10-CM

## 2019-04-24 MED ORDER — SUCRALFATE 1 G PO TABS: 1.0000 g | ORAL_TABLET | Freq: Three times a day (TID) | ORAL | 0 refills | Status: DC

## 2019-04-24 MED ORDER — OMEPRAZOLE 20 MG PO CPDR: 20.0000 mg | DELAYED_RELEASE_CAPSULE | Freq: Two times a day (BID) | ORAL | 3 refills | Status: DC

## 2019-04-24 NOTE — Progress Notes (Signed)
Med refills for Carafate.   Urgent Care follow up for her acid reflex   Per pt her blood sugar this morning was 125

## 2019-04-24 NOTE — Progress Notes (Signed)
Virtual Visit via Telephone Note  I connected with Jane Arellano on 04/24/19 at  9:30 AM EDT by telephone and verified that I am speaking with the correct person using two identifiers.   I discussed the limitations, risks, security and privacy concerns of performing an evaluation and management service by telephone and the availability of in person appointments. I also discussed with the patient that there may be a patient responsible charge related to this service. The patient expressed understanding and agreed to proceed.  Patient Location: Home Provider Location: Office Others participating in call: Guillermina City, RMA   History of Present Illness:      58 year old female seen in follow-up of recent emergency department visit on 04/03/2019 due to mid upper abdominal pain which had been increasing over a 2-week.  Patient had also been to the emergency department on 04/02/2019.  Patient presented to the emergency room due to a bee sting but also had complaint of worsening reflux after being changed from Pepcid to omeprazole to help with her reflux symptoms.  She reports that she accidentally sat on a bee and had stinging and pain in her left buttock.  She reports that this is now resolved.  She reports that she has had improvement in her burning sensation in her mid upper abdomen since she was placed on Carafate in the emergency department and she is also now taking the omeprazole twice daily.  Prior to her first emergency department visit, she had only been taking omeprazole once daily for 4 days.  At her initial ED visit she was also told to try taking Maalox as needed and she also feels that this is helping.  She continues to take Pradaxa for intermittent atrial fibrillation.  She denies any unusual bruising or bleeding.  She has had no blood in her stool and denies any dark/black or sticky stools.  She denies pain other than in the epigastric area.  She denies back pain, no dysuria or urinary  frequency.  She has had no chest pain or sensation of palpitations.  No shortness of breath or cough.  No peripheral edema.  She denies sore throat and has had no difficulty with swallowing or feeling as if food gets stuck in her throat while trying to swallow.  Past Medical History:  Diagnosis Date  . Atypical chest pain    a. 11/2005 Negative Myoview  . Diastolic dysfunction   . DM2 (diabetes mellitus, type 2) (HCC)   . Dysrhythmia    AFIB HX CV   . GERD (gastroesophageal reflux disease)   . Heart murmur   . History of substance abuse (HCC)   . HTN (hypertension)   . Iron deficiency anemia   . Paroxysmal atrial fibrillation (HCC) 05/01/2013   On Xarelto  . Subaortic membrane    a. 01/2010 Echo: EF 55-60%, No rwma, subaortic membrane with elevated LVOT mean gradient of 21 mmHg, Triv AI, mod dil LA, mildly increased PASP. b. 05/02/2013 Echo:  LVEF 60-65%, grade 1 diastolic dysfunction, mild LVH, subaortic stenosis w/ turbulation in LVOT c/w subaortic membrane (mean gradient 42 mmHg/peak gradient 81 mmHg), mild biatrial enlargement    Past Surgical History:  Procedure Laterality Date  . CARDIOVERSION N/A 11/04/2013   Procedure: CARDIOVERSION;  Surgeon: Peter M Swaziland, MD;  [facility] ENDOSCOPY;  Service: Cardiovascular;  Laterality: N/A;  . CESAREAN SECTION      Family History  Problem Relation Age of Onset  . Diabetes Father  deceased  . Cancer Mother 3585       colon  . Stroke Mother   . Hypertension Sister        alive and well  . Hypertension Sister        alive and well  . Heart attack Brother        deceased @ 3037    Social History   Tobacco Use  . Smoking status: Never Smoker  . Smokeless tobacco: Current User    Types: Snuff  Substance Use Topics  . Alcohol use: No    Comment: Drinks 3 - 40 oz beers daily.  i QUIT DOING THAT "  . Drug use: No    Types: Marijuana    Comment: Smokes marijuana weekly.  Has not used cocaine in 9 years.     Allergies    Allergen Reactions  . Xarelto [Rivaroxaban] Swelling    Gums bled, headaches  . Ketorolac Tromethamine Other (See Comments)    Unknown reaction       Observations/Objective: No vital signs or physical exam conducted as visit was done via telephone  Assessment and Plan: 1. Gastroesophageal reflux disease without esophagitis       Per patient she is feeling better on the current regimen of omeprazole twice daily and Carafate.  She will be referred to gastroenterology for further evaluation.  She is also currently on Pradaxa for intermittent atrial fibrillation.  She was previously on Xarelto but developed bleeding from her gums and the medication was discontinued.     - omeprazole (PRILOSEC) 20 MG capsule; Take 1 capsule (20 mg total) by mouth 2 (two) times a day. To reduce stomach acid  Dispense: 60 capsule; Refill: 3 - sucralfate (CARAFATE) 1 g tablet; Take 1 tablet (1 g total) by mouth 4 (four) times daily -  with meals and at bedtime.  Dispense: 120 tablet; Refill: 0 - Ambulatory referral to Gastroenterology  Follow Up Instructions:Return in about 6 weeks (around 06/05/2019) for DM.    I discussed the assessment and treatment plan with the patient. The patient was provided an opportunity to ask questions and all were answered. The patient agreed with the plan and demonstrated an understanding of the instructions.   The patient was advised to call back or seek an in-person evaluation if the symptoms worsen or if the condition fails to improve as anticipated.  I provided 11 minutes of non-face-to-face time during this encounter.   Cain Saupeammie Gina Leblond, MD

## 2019-04-26 ENCOUNTER — Other Ambulatory Visit: Payer: Self-pay

## 2019-04-26 ENCOUNTER — Ambulatory Visit (HOSPITAL_COMMUNITY)
Admission: RE | Admit: 2019-04-26 | Discharge: 2019-04-26 | Disposition: A | Discharge status: Home / Self Care | Payer: Medicaid Other | Source: Ambulatory Visit | Attending: Nurse Practitioner | Admitting: Nurse Practitioner

## 2019-04-26 ENCOUNTER — Other Ambulatory Visit (HOSPITAL_COMMUNITY): Payer: Self-pay | Admitting: *Deleted

## 2019-04-26 DIAGNOSIS — I480 Paroxysmal atrial fibrillation: Secondary | ICD-10-CM | POA: Diagnosis not present

## 2019-04-26 NOTE — Progress Notes (Signed)
Electrophysiology TeleHealth Note   Due to national recommendations of social distancing due to [place] 19, Audio/video telehealth visit is felt to be most appropriate for this patient at this time.  See MyChart message/consent below from today for patient consent regarding telehealth for the [facility].    Date:  04/26/2019   ID:  Jane Arellano, DOB 26-Aug-1961, MRN [id number]  Location: home  Provider location: [address] [place], [place] [address] Evaluation Performed: Follow up  PCP:  Antony Blackbird, MD  Primary Cardiologist:  none Primary Electrophysiologist: afib clinic   CC:" My heart is fine."   History of Present Illness: Jane Arellano is a 58 y.o. female who presents via audio conferencing for a telehealth visit today.   Pt reports that she has not had any issues with afib. She was in the ER recently for abd/chest pain and seen her PCP since and it is felt to be from reflux. Her reflux meds have been adjusted.  Recent ekg reviewed and is in SR. No bleedng issues with pradxa. Pt does not have BP or HR today but is was 127/79 in ER 5/13 with a HR of 87 bpm.  Today, she denies symptoms of palpitations, chest pain, shortness of breath, orthopnea, PND, lower extremity edema, claudication, dizziness, presyncope, syncope, bleeding, or neurologic sequela. The patient is tolerating medications without difficulties and is otherwise without complaint today.   she denies symptoms of cough, fevers, chills, or new SOB worrisome for COVID 19.     she has a BMI of There is no height or weight on file to calculate BMI.. There were no vitals filed for this visit.  Past Medical History:  Diagnosis Date  . Atypical chest pain    a. 11/2005 Negative Myoview  . Diastolic dysfunction   . DM2 (diabetes mellitus, type 2) ([place])   . Dysrhythmia    AFIB HX CV   . GERD (gastroesophageal reflux disease)   . Heart murmur   . History of substance abuse ([place])   . HTN  (hypertension)   . Iron deficiency anemia   . Paroxysmal atrial fibrillation ([place]) 05/01/2013   On Xarelto  . Subaortic membrane    a. 01/2010 Echo: EF 55-60%, No rwma, subaortic membrane with elevated LVOT mean gradient of 21 mmHg, Triv AI, mod dil LA, mildly increased PASP. b. 05/02/2013 Echo:  LVEF 56-31%, grade 1 diastolic dysfunction, mild LVH, subaortic stenosis w/ turbulation in LVOT c/w subaortic membrane (mean gradient 42 mmHg/peak gradient 81 mmHg), mild biatrial enlargement   Past Surgical History:  Procedure Laterality Date  . CARDIOVERSION N/A 11/04/2013   Procedure: CARDIOVERSION;  Surgeon: Peter M Martinique, MD;  Location: [facility] ENDOSCOPY;  Service: Cardiovascular;  Laterality: N/A;  . CESAREAN SECTION       Current Outpatient Medications  Medication Sig Dispense Refill  . ACCU-CHEK FASTCLIX LANCETS MISC USE AS DIRECTED 100 each 0  . alum & mag hydroxide-simeth (MAALOX/MYLANTA) 200-200-20 MG/5ML suspension Take 15 mLs by mouth every 6 (six) hours as needed for indigestion or heartburn. 355 mL 0  . amitriptyline (ELAVIL) 50 MG tablet TAKE 1 TABLET (50 MG TOTAL) BY MOUTH AT BEDTIME. 30 tablet 0  . amLODipine (NORVASC) 10 MG tablet TAKE 1 TABLET BY MOUTH DAILY. 90 tablet 0  . Blood Glucose Calibration (ACCU-CHEK AVIVA) SOLN USE AS INSTRUCTED 1 each 0  . Blood Glucose Monitoring Suppl (ACCU-CHEK AVIVA PLUS) w/Device KIT USE AS DIRECTED TWICE DAILY AT 8 AM AND 10  PM 1 kit 0  . Calcium Carb-Cholecalciferol (CALCIUM-VITAMIN D3) 500-400 MG-UNIT TABS Take 1 tablet by mouth daily. 60 tablet 5  . carvedilol (COREG) 3.125 MG tablet TAKE 1 TABLET (3.125 MG TOTAL) BY MOUTH 2 (TWO) TIMES DAILY. 60 tablet 0  . cetirizine (ZYRTEC) 10 MG tablet Take 1 tablet (10 mg total) by mouth daily. As needed for nasal congestion 30 tablet 3  . fenofibrate (TRICOR) 145 MG tablet TAKE 1 TABLET BY MOUTH DAILY. 30 tablet 0  . ferrous sulfate 325 (65 FE) MG tablet Take 1 tablet (325 mg total) by mouth 3 (three)  times daily with meals. 90 tablet 3  . furosemide (LASIX) 20 MG tablet TAKE 1 TABLET BY MOUTH DAILY. 30 tablet 2  . gabapentin (NEURONTIN) 300 MG capsule TAKE 1 CAPSULE BY MOUTH 3 TIMES DAILY. 90 capsule 3  . GAVILAX powder MIX 1 CAPFUL WITH LIQUID AND DRINK ONCE DAILY 476 g 2  . glucose blood (ACCU-CHEK AVIVA PLUS) test strip USE AS INSTRUCTED 100 each 0  . hydrOXYzine (ATARAX/VISTARIL) 25 MG tablet TAKE 1 TABLET BY MOUTH 3 TIMES DAILY AS NEEDED FOR ANXIETY. 60 tablet 2  . metFORMIN (GLUCOPHAGE) 1000 MG tablet Take 1 tablet (1,000 mg total) by mouth 2 (two) times daily with a meal. 180 tablet 3  . Multiple Vitamin (MULTIVITAMIN WITH MINERALS) TABS tablet Take 1 tablet by mouth daily.    . Omega-3 Fatty Acids (FISH OIL) 1000 MG CPDR Take 3 g by mouth daily. 90 capsule 6  . omeprazole (PRILOSEC) 20 MG capsule Take 1 capsule (20 mg total) by mouth 2 (two) times a day. To reduce stomach acid 60 capsule 3  . PRADAXA 150 MG CAPS capsule TAKE 1 CAPSULE BY MOUTH 2 TIMES DAILY. 180 capsule 0  . sucralfate (CARAFATE) 1 g tablet Take 1 tablet (1 g total) by mouth 4 (four) times daily -  with meals and at bedtime. 120 tablet 0   No current facility-administered medications for this encounter.     Allergies:   Xarelto [rivaroxaban] and Ketorolac tromethamine   Social History:  The patient  reports that she has never smoked. Her smokeless tobacco use includes snuff. She reports that she does not drink alcohol or use drugs.   Family History:  The patient's  family history includes Cancer (age of onset: 57) in her mother; Diabetes in her father; Heart attack in her brother; Hypertension in her sister and sister; Stroke in her mother.    ROS:  Please see the history of present illness.   All other systems are personally reviewed and negative.   Exam: NA phone call  Recent Labs: 09/28/2018: ALT 16; BUN 9; Creatinine, Ser 0.79; Hemoglobin 12.2; Platelets 206; Potassium 3.7; Sodium 143  personally reviewed       ASSESSMENT AND PLAN:  1.  Paroxysmal atrial fibrillation Dong well staying in SR Continue coreg without change Continue pradaxa without bleeding issues  This patients CHA2DS2-VASc Score and unadjusted Ischemic Stroke Rate (% per year) is equal to 3.2 % stroke rate/year from a score of 3  Above score calculated as 1 point each if present [CHF, HTN, DM, Vascular=MI/PAD/Aortic Plaque, Age if 65-74, or Female] Above score calculated as 2 points each if present [Age > 75, or Stroke/TIA/TE]    COVID screen The patient does not have any symptoms that suggest any further testing/ screening at this time.  Social distancing reinforced today.   Follow-up: Will need f/u echo  in October for presence of  mildly dilated aorta on last years echo. will then see in clinic afterward.  Current medicines are reviewed at length with the patient today.   The patient does not have concerns regarding her medicines.  The following changes were made today:  none  Labs/ tests ordered today include: none No orders of the defined types were placed in this encounter.   Patient Risk:  after full review of this patients clinical status, I feel that they are at moderate risk at this time.   Today, I have spent 15 minutes with the patient with telehealth technology discussing above .    Eduard Roux NP  04/26/2019 11:06 AM  [facility] [address] [place], [place] [address] [phone]   I hereby voluntarily request, consent and authorize the [facility] and its employed or contracted physicians, physician assistants, nurse practitioners or other licensed health care professionals (the Practitioner), to provide me with telemedicine health care services (the "Services") as deemed necessary by the treating Practitioner. I acknowledge and consent to receive the Services by the Practitioner via telemedicine. I understand that the telemedicine visit will  involve communicating with the Practitioner through live audiovisual communication technology and the disclosure of certain medical information by electronic transmission. I acknowledge that I have been given the opportunity to request an in-person assessment or other available alternative prior to the telemedicine visit and am voluntarily participating in the telemedicine visit.   I understand that I have the right to withhold or withdraw my consent to the use of telemedicine in the course of my care at any time, without affecting my right to future care or treatment, and that the Practitioner or I may terminate the telemedicine visit at any time. I understand that I have the right to inspect all information obtained and/or recorded in the course of the telemedicine visit and may receive copies of available information for a reasonable fee.  I understand that some of the potential risks of receiving the Services via telemedicine include:   Delay or interruption in medical evaluation due to technological equipment failure or disruption;  Information transmitted may not be sufficient (e.g. poor resolution of images) to allow for appropriate medical decision making by the Practitioner; and/or  In rare instances, security protocols could fail, causing a breach of personal health information.   Furthermore, I acknowledge that it is my responsibility to provide information about my medical history, conditions and care that is complete and accurate to the best of my ability. I acknowledge that Practitioner's advice, recommendations, and/or decision may be based on factors not within their control, such as incomplete or inaccurate data provided by me or distortions of diagnostic images or specimens that may result from electronic transmissions. I understand that the practice of medicine is not an exact science and that Practitioner makes no warranties or guarantees regarding treatment outcomes. I acknowledge that I  will receive a copy of this consent concurrently upon execution via email to the email address I last provided but may also request a printed copy by calling the office of the [facility].  I understand that my insurance will be billed for this visit.   I have read or had this consent read to me.  I understand the contents of this consent, which adequately explains the benefits and risks of the Services being provided via telemedicine.  I have been provided ample opportunity to ask questions regarding this consent and the Services and have had my  questions answered to my satisfaction.  I give my informed consent for the services to be provided through the use of telemedicine in my medical care  By participating in this telemedicine visit I agree to the above.

## 2019-04-29 ENCOUNTER — Telehealth: Payer: Self-pay

## 2019-04-29 NOTE — Telephone Encounter (Signed)
Phone screening for [facility]ximity appointment complete 

## 2019-04-30 ENCOUNTER — Encounter: Payer: Self-pay | Admitting: Internal Medicine

## 2019-04-30 ENCOUNTER — Other Ambulatory Visit: Payer: Self-pay

## 2019-04-30 ENCOUNTER — Ambulatory Visit (INDEPENDENT_AMBULATORY_CARE_PROVIDER_SITE_OTHER): Payer: Medicaid Other | Admitting: Internal Medicine

## 2019-04-30 VITALS — Ht 66.0 in | Wt 192.0 lb

## 2019-04-30 DIAGNOSIS — R1013 Epigastric pain: Secondary | ICD-10-CM | POA: Diagnosis not present

## 2019-04-30 DIAGNOSIS — K219 Gastro-esophageal reflux disease without esophagitis: Secondary | ICD-10-CM

## 2019-04-30 DIAGNOSIS — Z800 Family history of malignant neoplasm of digestive organs: Secondary | ICD-10-CM

## 2019-04-30 DIAGNOSIS — Z7901 Long term (current) use of anticoagulants: Secondary | ICD-10-CM

## 2019-04-30 MED ORDER — OMEPRAZOLE 40 MG PO CPDR: 40.0000 mg | DELAYED_RELEASE_CAPSULE | Freq: Every day | ORAL | 3 refills | Status: DC

## 2019-04-30 NOTE — Progress Notes (Signed)
HISTORY OF PRESENT ILLNESS:  Jane Arellano is a 58 y.o. female, new to this clinic, with multiple significant medical problems including diabetes mellitus, atrial fibrillation on Pradaxa, hypertension, history of substance abuse, and GERD.  Patient sent today by her primary care provider Dr. Jillyn HiddenFulp regarding recent problems with epigastric pain.  Previous patient at [facility].  Patient tells me that she has had longstanding classic reflux symptoms as manifested by pyrosis with associated chest discomfort and regurgitation.  No dysphasia.  Previously she had been on acid suppressive medication with good results.  Off medication she invariably develops significant and often worsening symptoms.  She tells me that symptoms were severe in recent months for which she sought medical attention at the urgent care Apr 03, 2019.  In addition to GI cocktail she was prescribed omeprazole 20 mg daily and Carafate 1 tablet 4 times daily.  Since that time she reports resolution of her symptoms.  She still will experience some regurgitation at night.  She did have follow-up with Dr. Ala DachFord last week and was doing well.  Review of outside GI records from [facility] shows a colonoscopy with Dr. Dorena CookeyJohn Hayes June 19, 2017 due to family history of colon cancer in her mother (older than age 59).  Examination was normal with follow-up in 5 years recommended.  Patient tells me that her entire GI review of systems is otherwise negative.  Review of outside blood work from November 2019 finds unremarkable comprehensive metabolic panel.  Normal liver test.  Normal CBC with hemoglobin 12.2.  Reviewed outside x-ray file finds no relevant GI studies.  REVIEW OF SYSTEMS:  All non-GI ROS negative except for  Past Medical History:  Diagnosis Date  . Atypical chest pain    a. 11/2005 Negative Myoview  . Diastolic dysfunction   . DM2 (diabetes mellitus, type 2) (HCC)   . Dysrhythmia    AFIB HX CV   . GERD (gastroesophageal reflux disease)    . Heart murmur   . History of substance abuse (HCC)   . HTN (hypertension)   . Iron deficiency anemia   . Paroxysmal atrial fibrillation (HCC) 05/01/2013   On Xarelto  . Subaortic membrane    a. 01/2010 Echo: EF 55-60%, No rwma, subaortic membrane with elevated LVOT mean gradient of 21 mmHg, Triv AI, mod dil LA, mildly increased PASP. b. 05/02/2013 Echo:  LVEF 60-65%, grade 1 diastolic dysfunction, mild LVH, subaortic stenosis w/ turbulation in LVOT c/w subaortic membrane (mean gradient 42 mmHg/peak gradient 81 mmHg), mild biatrial enlargement    Past Surgical History:  Procedure Laterality Date  . CARDIOVERSION N/A 11/04/2013   Procedure: CARDIOVERSION;  Surgeon: Peter M SwazilandJordan, MD;  Location: [facility] ENDOSCOPY;  Service: Cardiovascular;  Laterality: N/A;  . CESAREAN SECTION      Social History Jane MuleWindi Y Englander  reports that she has never smoked. Her smokeless tobacco use includes snuff. She reports that she does not drink alcohol or use drugs.  family history includes Cancer (age of onset: 7285) in her mother; Diabetes in her father; Heart attack in her brother; Hypertension in her sister and sister; Stroke in her mother.  Allergies  Allergen Reactions  . Xarelto [Rivaroxaban] Swelling    Gums bled, headaches  . Ketorolac Tromethamine Other (See Comments)    Unknown reaction       PHYSICAL EXAMINATION: No physical examination with telehealth medicine visit  ASSESSMENT:  1.  GERD with associated epigastric pain.  Symptoms improved after PPI and Carafate therapy.  No alarm features 2.  Multiple medical problems on Pradaxa 3.  Family history of colon cancer.  Normal examination July 2018   PLAN:  1.  Reflux precautions.  Please send a copy of reflux precaution sheet and GERD information to patient 2.  Stop Carafate 3.  PRESCRIBE OMEPRAZOLE 40 mg daily; #30; 11 refills.  She asked that this be called into [facility] on [address].  I explained to the patient that she  should take this once daily indefinitely.  The reasons are to adequately treat her reflux symptoms.  As well, in a patient with multiple comorbidities on chronic anticoagulation this would reduce the risk of upper GI bleeding based on available data. 4.  MAKE RECALL COLONOSCOPY for July 2023 5.  Resume general medical care with PCP.  Interval GI follow-up as needed This telehealth medicine visit was initiated by the patient and consented for by the patient who was in her home while I was in the office today the encounter.  She understands her may be associated professional charge for this service

## 2019-04-30 NOTE — Addendum Note (Signed)
Addended by: Audrea Muscat on: 04/30/2019 02:51 PM   Modules accepted: Orders

## 2019-04-30 NOTE — Patient Instructions (Signed)
1.  Stop Carafate  2.  PRESCRIBE OMEPRAZOLE 40 mg daily; #30; 11 refills.  She asked that [facility] be [facility] into [facility] on [address].  I explained to the patient that she should take this once daily indefinitely.  The reasons are to adequately treat her reflux symptoms.  As well, in a patient with multiple comorbidities on chronic anticoagulation this would reduce the risk of upper GI bleeding based on available data.  3.  MAKE RECALL COLONOSCOPY for July 2023 - you will receive a letter ahead of time reminding you when it is time to call and schedule this.  4.  Resume general medical care with PCP.  Interval GI follow-up as needed

## 2019-05-21 ENCOUNTER — Other Ambulatory Visit: Payer: Self-pay | Admitting: Family Medicine

## 2019-05-21 DIAGNOSIS — E785 Hyperlipidemia, unspecified: Secondary | ICD-10-CM

## 2019-05-21 DIAGNOSIS — I10 Essential (primary) hypertension: Secondary | ICD-10-CM

## 2019-05-21 MED FILL — FUROSEMIDE 20 MG TABS: 20 | 30 days supply | Qty: 30 | Fill #2

## 2019-05-21 MED FILL — GABAPENTIN 300 MG CAPSULE: 300 | 30 days supply | Qty: 90 | Fill #2

## 2019-05-21 MED FILL — CETIRIZINE HCL 10 MG TABS: 10 | 30 days supply | Qty: 30 | Fill #1

## 2019-05-21 MED FILL — AMITRIPTYLINE HCL 50 MG TAB: 50 | 30 days supply | Qty: 30 | Fill #0

## 2019-05-22 MED FILL — FENOFIBRATE 145 MG TABS: 145 | 30 days supply | Qty: 30 | Fill #0

## 2019-05-22 MED FILL — CARVEDILOL 3.125 MG TABLET: 3.125 | 30 days supply | Qty: 60 | Fill #0

## 2019-05-22 MED FILL — PRADAXA 150 MG CAP: 150 | 30 days supply | Qty: 60 | Fill #0

## 2019-06-12 ENCOUNTER — Encounter: Payer: Self-pay | Admitting: Family Medicine

## 2019-06-12 ENCOUNTER — Ambulatory Visit: Payer: Medicaid Other | Attending: Family Medicine | Admitting: Family Medicine

## 2019-06-12 ENCOUNTER — Other Ambulatory Visit: Payer: Self-pay

## 2019-06-12 DIAGNOSIS — E118 Type 2 diabetes mellitus with unspecified complications: Secondary | ICD-10-CM

## 2019-06-12 DIAGNOSIS — Z79899 Other long term (current) drug therapy: Secondary | ICD-10-CM

## 2019-06-12 DIAGNOSIS — E785 Hyperlipidemia, unspecified: Secondary | ICD-10-CM | POA: Diagnosis not present

## 2019-06-12 DIAGNOSIS — D509 Iron deficiency anemia, unspecified: Secondary | ICD-10-CM

## 2019-06-12 DIAGNOSIS — I10 Essential (primary) hypertension: Secondary | ICD-10-CM | POA: Diagnosis not present

## 2019-06-12 DIAGNOSIS — I5189 Other ill-defined heart diseases: Secondary | ICD-10-CM | POA: Diagnosis not present

## 2019-06-12 DIAGNOSIS — K219 Gastro-esophageal reflux disease without esophagitis: Secondary | ICD-10-CM | POA: Diagnosis not present

## 2019-06-12 DIAGNOSIS — Z7901 Long term (current) use of anticoagulants: Secondary | ICD-10-CM

## 2019-06-12 DIAGNOSIS — E1142 Type 2 diabetes mellitus with diabetic polyneuropathy: Secondary | ICD-10-CM

## 2019-06-12 DIAGNOSIS — I480 Paroxysmal atrial fibrillation: Secondary | ICD-10-CM | POA: Diagnosis not present

## 2019-06-12 MED ORDER — ACCU-CHEK AVIVA PLUS VI STRP: ORAL_STRIP | 99 refills | Status: DC

## 2019-06-12 NOTE — Progress Notes (Signed)
Pt. Is following up on DM. Pt. Stated her blood sugar fasting this morning was 140 fasting.

## 2019-06-12 NOTE — Progress Notes (Signed)
Virtual Visit via Telephone Note  I connected with Jane Arellano  on 06/12/19 at 11:10 AM EDT by telephone and verified that I am speaking with the correct person using two identifiers.   I discussed the limitations, risks, security and privacy concerns of performing an evaluation and management service by telephone and the availability of in person appointments. I also discussed with the patient that there may be a patient responsible charge related to this service. The patient expressed understanding and agreed to proceed.  Patient Location: Home Provider Location: [facility] office Others participating in call: call initated by Vidal SchwalbeBien Yy, CMA   History of Present Illness:      58 yo female seen in follow-up of multiple medical issues including Diabetes with peripheral neuropathy, essential hypertension with diastolic dysfunction, dyslipidemia, GERD, anemia and paroxysmal atrial fibrillation for which patient is currently on Pradaxa for anticoagulation.  She denies any unusual bruising or bleeding associated with her use of Pradaxa for atrial fibrillation.  She is seen no blood in the stools and no dark stools.  Patient reports that her blood sugar control has improved and her blood sugars are generally staying around 130 or less fasting.  She denies any abdominal pain, nausea or diarrhea associated with metformin use.  Patient reports that her blood pressures are generally in the 120s to 130 over 70s.  She notes that she has had improvement in her acid reflux symptoms.  She saw GI and is now on omeprazole 40 mg daily.  She is no longer taking Carafate.  Patient has also seen her cardiologist in June.  Patient reports that she is feeling well.  She had no changes in her medications.  She has had no sensation of increased heart rate or chest pain since her ED visit earlier this year for chest pain which was thought to be related to acid reflux.  She reports that her peripheral neuropathy is tolerable with  the use of gabapentin and Elavil at bedtime.  She continues to take omega-3 fatty acids by prescription as well as fenofibrate for lipids and reports no issues with the medications.   Past Medical History:  Diagnosis Date  . Atypical chest pain    a. 11/2005 Negative Myoview  . Diastolic dysfunction   . DM2 (diabetes mellitus, type 2) (HCC)   . Dysrhythmia    AFIB HX CV   . GERD (gastroesophageal reflux disease)   . Heart murmur   . History of substance abuse (HCC)   . HTN (hypertension)   . Iron deficiency anemia   . Paroxysmal atrial fibrillation (HCC) 05/01/2013   On Xarelto  . Subaortic membrane    a. 01/2010 Echo: EF 55-60%, No rwma, subaortic membrane with elevated LVOT mean gradient of 21 mmHg, Triv AI, mod dil LA, mildly increased PASP. b. 05/02/2013 Echo:  LVEF 60-65%, grade 1 diastolic dysfunction, mild LVH, subaortic stenosis w/ turbulation in LVOT c/w subaortic membrane (mean gradient 42 mmHg/peak gradient 81 mmHg), mild biatrial enlargement    Past Surgical History:  Procedure Laterality Date  . CARDIOVERSION N/A 11/04/2013   Procedure: CARDIOVERSION;  Surgeon: Peter M SwazilandJordan, MD;  [facility];  Service: Cardiovascular;  Laterality: N/A;  . CESAREAN SECTION      Family History  Problem Relation Age of Onset  . Diabetes Father        deceased  . Cancer Mother 1985       colon  . Stroke Mother   . Hypertension Sister  alive and well  . Hypertension Sister        alive and well  . Heart attack Brother        deceased @ 35    Social History   Tobacco Use  . Smoking status: Never Smoker  . Smokeless tobacco: Current User    Types: Snuff  Substance Use Topics  . Alcohol use: No    Comment: Drinks 3 - 40 oz beers daily.  i QUIT DOING THAT "  . Drug use: No    Types: Marijuana    Comment: Smokes marijuana weekly.  Has not used cocaine in 9 years.     Allergies  Allergen Reactions  . Xarelto [Rivaroxaban] Swelling    Gums bled, headaches  .  Ketorolac Tromethamine Other (See Comments)    Unknown reaction       Observations/Objective: No vital signs or physical exam conducted as visit was done via telephone  Assessment and Plan: 1. Type 2 diabetes mellitus with complication, without long-term current use of insulin ([place]) Patient's most recent hemoglobin A1c in November 2019 showed good control of blood sugars with hemoglobin A1c of 6.4.  Patient continues to take metformin without difficulty.  She continues to monitor her blood sugars and reports that her diabetes continues to remain well controlled.  Patient is reminded to have yearly diabetic eye exam and diabetic foot care discussed.  Patient has been asked to schedule an appointment for lab work in the next 1 to 2 weeks. - glucose blood (ACCU-CHEK AVIVA PLUS) test strip; USE AS INSTRUCTED TO CHECK BLOOD SUGAR UP TO 3 TIMES DAILY  Dispense: 100 each; Refill: prn - Comprehensive metabolic panel; Future - Hemoglobin A1c; Future  2. Diabetic polyneuropathy associated with type 2 diabetes mellitus ([place]) She reports that her diabetic neuropathy symptoms are controlled/manageable with the use of gabapentin and Elavil.  Diabetic foot care discussed and patient is to make lab appointment for hemoglobin A1c as well as to have CMP in follow-up of medication use. - glucose blood (ACCU-CHEK AVIVA PLUS) test strip; USE AS INSTRUCTED TO CHECK BLOOD SUGAR UP TO 3 TIMES DAILY  Dispense: 100 each; Refill: prn - Comprehensive metabolic panel; Future - Hemoglobin A1c; Future  3. Essential hypertension; 5. Diastolic Dysfunction Patient with hypertension that she currently reports is well controlled.  Patient's cardiology note reviewed.  She appears to be stable on her current medications which she will continue at this time including Norvasc, carvedilol and Lasix.  Patient will have electrolytes checked as part of CMP when she returns for lab visit.  Per her recent cardiology note, patient with  stable diastolic dysfunction and will need repeat echocardiogram in October of this year.  Patient's last echo was 05/02/2013 showing grade 1 diastolic dysfunction and ejection fraction of 50 to 60%.  Echo in October is being done in follow-up of mild aortic dilation seen on last echo per cardiology note.   4. Dyslipidemia Patient with triglyceride level that was elevated at 234 on her last lipid panel.  She is currently on fenofibrate and omega-3 fatty acids.  Patient will have repeat fasting lipid panel at her upcoming appointment and will then be switched back to statin medication if triglycerides are near normal with discontinuation of fenofibrate and switch back to a statin medication along with continuation of omega-3 fatty acids. - glucose blood (ACCU-CHEK AVIVA PLUS) test strip; USE AS INSTRUCTED TO CHECK BLOOD SUGAR UP TO 3 TIMES DAILY  Dispense: 100 each; Refill: prn -  Lipid panel; Future  6. Iron deficiency anemia, unspecified iron deficiency anemia type; 10.  Long-term use of anticoagulant Patient will have CBC at upcoming lab visit in follow-up of iron deficiency anemia which is likely related to her long-term use of anticoagulant medication.  Patient currently takes an iron supplement but no longer takes this 3 times daily as her last CBC was normal. - CBC with Differential; Future  7. PAF (paroxysmal atrial fibrillation) (HCC); 10.  Long-term use of anticoagulant Per cardiology note, patient's most recent EKG showed normal sinus rhythm which was likely done during her ED visit earlier this year.  Patient has remained rate controlled.  She will continue anticoagulation with Pradaxa as well as the use of Coreg.  8. Gastroesophageal reflux disease without esophagitis She reports resolution of atypical chest pain and acid reflux symptoms.  Recent gastroenterology note reviewed and patient's sucralfate was discontinued and she is currently on omeprazole 40 mg and reports no significant  issues with acid reflux at this time.  9. Encounter for long-term (current) use of medications She will have a complete metabolic panel at her upcoming fasting lab visit in follow-up of the long-term use of multiple medications including metformin, fenofibrate, amlodipine, Lasix and gabapentin which can affect or be affected by changes in electrolytes and liver enzymes.  Follow Up Instructions:Return in about 4 months (around 10/13/2019) for fasting labs; 4 month in office if possible.   An After Visit Summary was printed and given to the patient.    I discussed the assessment and treatment plan with the patient. The patient was provided an opportunity to ask questions and all were answered. The patient agreed with the plan and demonstrated an understanding of the instructions.   The patient was advised to call back or seek an in-person evaluation if the symptoms worsen or if the condition fails to improve as anticipated.  I provided 16 minutes of non-face-to-face time during this encounter.  An additional 15 minutes was spent on review of notes from other providers including cardiology and GI as well as review of prior labs, treatment plan and orders at today's visit.   Cain Saupeammie Bedelia Pong, MD

## 2019-06-16 ENCOUNTER — Encounter: Payer: Self-pay | Admitting: Family Medicine

## 2019-06-21 ENCOUNTER — Other Ambulatory Visit: Payer: Self-pay | Admitting: Family Medicine

## 2019-06-21 DIAGNOSIS — I10 Essential (primary) hypertension: Secondary | ICD-10-CM

## 2019-06-21 MED FILL — FENOFIBRATE 145 MG TABS: 145 | 30 days supply | Qty: 30 | Fill #1

## 2019-06-21 MED FILL — PRADAXA 150 MG CAP: 150 | 30 days supply | Qty: 60 | Fill #1

## 2019-06-25 MED FILL — FUROSEMIDE 20 MG TABS: 20 | 90 days supply | Qty: 90 | Fill #0

## 2019-06-27 ENCOUNTER — Ambulatory Visit: Payer: Medicaid Other | Attending: Family Medicine

## 2019-06-27 ENCOUNTER — Other Ambulatory Visit: Payer: Self-pay

## 2019-06-27 DIAGNOSIS — Z7901 Long term (current) use of anticoagulants: Secondary | ICD-10-CM | POA: Diagnosis not present

## 2019-06-27 DIAGNOSIS — E118 Type 2 diabetes mellitus with unspecified complications: Secondary | ICD-10-CM

## 2019-06-27 DIAGNOSIS — D509 Iron deficiency anemia, unspecified: Secondary | ICD-10-CM | POA: Diagnosis not present

## 2019-06-27 DIAGNOSIS — E1142 Type 2 diabetes mellitus with diabetic polyneuropathy: Secondary | ICD-10-CM | POA: Diagnosis not present

## 2019-06-27 DIAGNOSIS — E785 Hyperlipidemia, unspecified: Secondary | ICD-10-CM

## 2019-06-28 LAB — CBC WITH DIFFERENTIAL/PLATELET
Basophils Absolute: 0 x10E3/uL (ref 0.0–0.2)
Basos: 1 %
EOS (ABSOLUTE): 0.1 x10E3/uL (ref 0.0–0.4)
Eos: 1 %
Hematocrit: 37.3 % (ref 34.0–46.6)
Hemoglobin: 12.6 g/dL (ref 11.1–15.9)
Immature Grans (Abs): 0 x10E3/uL (ref 0.0–0.1)
Immature Granulocytes: 0 %
Lymphocytes Absolute: 1.8 x10E3/uL (ref 0.7–3.1)
Lymphs: 36 %
MCH: 33.8 pg — ABNORMAL HIGH (ref 26.6–33.0)
MCHC: 33.8 g/dL (ref 31.5–35.7)
MCV: 100 fL — ABNORMAL HIGH (ref 79–97)
Monocytes Absolute: 0.3 x10E3/uL (ref 0.1–0.9)
Monocytes: 6 %
Neutrophils Absolute: 2.8 x10E3/uL (ref 1.4–7.0)
Neutrophils: 56 %
Platelets: 243 x10E3/uL (ref 150–450)
RBC: 3.73 x10E6/uL — ABNORMAL LOW (ref 3.77–5.28)
RDW: 13 % (ref 11.7–15.4)
WBC: 5 x10E3/uL (ref 3.4–10.8)

## 2019-06-28 LAB — COMPREHENSIVE METABOLIC PANEL WITH GFR
ALT: 11 IU/L (ref 0–32)
AST: 15 IU/L (ref 0–40)
Albumin/Globulin Ratio: 1.7 (ref 1.2–2.2)
Albumin: 4.9 g/dL (ref 3.8–4.9)
Alkaline Phosphatase: 44 IU/L (ref 39–117)
BUN/Creatinine Ratio: 11 (ref 9–23)
BUN: 10 mg/dL (ref 6–24)
Bilirubin Total: 0.3 mg/dL (ref 0.0–1.2)
CO2: 23 mmol/L (ref 20–29)
Calcium: 10.1 mg/dL (ref 8.7–10.2)
Chloride: 106 mmol/L (ref 96–106)
Creatinine, Ser: 0.89 mg/dL (ref 0.57–1.00)
GFR calc Af Amer: 82 mL/min/1.73
GFR calc non Af Amer: 71 mL/min/1.73
Globulin, Total: 2.9 g/dL (ref 1.5–4.5)
Glucose: 120 mg/dL — ABNORMAL HIGH (ref 65–99)
Potassium: 4.3 mmol/L (ref 3.5–5.2)
Sodium: 147 mmol/L — ABNORMAL HIGH (ref 134–144)
Total Protein: 7.8 g/dL (ref 6.0–8.5)

## 2019-06-28 LAB — LIPID PANEL
Chol/HDL Ratio: 2.2 ratio (ref 0.0–4.4)
Cholesterol, Total: 127 mg/dL (ref 100–199)
HDL: 59 mg/dL
LDL Calculated: 39 mg/dL (ref 0–99)
Triglycerides: 145 mg/dL (ref 0–149)
VLDL Cholesterol Cal: 29 mg/dL (ref 5–40)

## 2019-06-28 LAB — HEMOGLOBIN A1C
Est. average glucose Bld gHb Est-mCnc: 131 mg/dL
Hgb A1c MFr Bld: 6.2 % — ABNORMAL HIGH (ref 4.8–5.6)

## 2019-07-01 ENCOUNTER — Other Ambulatory Visit: Payer: Self-pay | Admitting: Physician Assistant

## 2019-07-01 ENCOUNTER — Other Ambulatory Visit: Payer: Self-pay | Admitting: Family Medicine

## 2019-07-01 DIAGNOSIS — I10 Essential (primary) hypertension: Secondary | ICD-10-CM

## 2019-07-01 DIAGNOSIS — E118 Type 2 diabetes mellitus with unspecified complications: Secondary | ICD-10-CM

## 2019-07-01 MED FILL — metFORMIN HCL 1000 MG TABS: 1000 | 90 days supply | Qty: 180 | Fill #0

## 2019-07-01 MED FILL — AMLODIPINE BESYLATE 10 MG T: 10 | 90 days supply | Qty: 90 | Fill #0

## 2019-07-01 MED FILL — CARVEDILOL 3.125 MG TABLET: 3.125 | 30 days supply | Qty: 60 | Fill #1

## 2019-07-03 ENCOUNTER — Telehealth: Payer: Self-pay | Admitting: Family Medicine

## 2019-07-03 NOTE — Telephone Encounter (Signed)
New Message   Pt calling to check on the status of her lab results

## 2019-07-04 NOTE — Telephone Encounter (Signed)
Lab results was given to patient today at 9:44am

## 2019-07-09 DIAGNOSIS — H2513 Age-related nuclear cataract, bilateral: Secondary | ICD-10-CM | POA: Diagnosis not present

## 2019-07-09 DIAGNOSIS — H0288B Meibomian gland dysfunction left eye, upper and lower eyelids: Secondary | ICD-10-CM | POA: Diagnosis not present

## 2019-07-09 DIAGNOSIS — S0500XS Injury of conjunctiva and corneal abrasion without foreign body, unspecified eye, sequela: Secondary | ICD-10-CM | POA: Diagnosis not present

## 2019-07-09 DIAGNOSIS — H0288A Meibomian gland dysfunction right eye, upper and lower eyelids: Secondary | ICD-10-CM | POA: Diagnosis not present

## 2019-07-22 MED FILL — FENOFIBRATE 145 MG TAB: 145 | 30 days supply | Qty: 30 | Fill #2

## 2019-07-22 MED FILL — GABAPENTIN 300 MG CAPSULE: 300 | 30 days supply | Qty: 90 | Fill #3

## 2019-07-23 DIAGNOSIS — K219 Gastro-esophageal reflux disease without esophagitis: Secondary | ICD-10-CM | POA: Diagnosis not present

## 2019-07-23 DIAGNOSIS — R1013 Epigastric pain: Secondary | ICD-10-CM | POA: Diagnosis not present

## 2019-08-16 ENCOUNTER — Other Ambulatory Visit: Payer: Self-pay | Admitting: Family Medicine

## 2019-08-16 DIAGNOSIS — E785 Hyperlipidemia, unspecified: Secondary | ICD-10-CM

## 2019-08-16 MED FILL — CARVEDILOL 3.125 MG TABLET: 3.125 | 30 days supply | Qty: 60 | Fill #2

## 2019-08-16 MED FILL — PRADAXA 150 MG CAP: 150 | 30 days supply | Qty: 60 | Fill #2

## 2019-08-19 MED FILL — FENOFIBRATE 145 MG TABLET: 145 | 30 days supply | Qty: 30 | Fill #0

## 2019-08-27 ENCOUNTER — Other Ambulatory Visit: Payer: Self-pay | Admitting: Family Medicine

## 2019-08-27 DIAGNOSIS — Z1231 Encounter for screening mammogram for malignant neoplasm of breast: Secondary | ICD-10-CM

## 2019-09-03 ENCOUNTER — Ambulatory Visit (HOSPITAL_COMMUNITY): Payer: Medicaid Other | Admitting: Nurse Practitioner

## 2019-09-05 DIAGNOSIS — Z5181 Encounter for therapeutic drug level monitoring: Secondary | ICD-10-CM | POA: Diagnosis not present

## 2019-09-12 DIAGNOSIS — Z5181 Encounter for therapeutic drug level monitoring: Secondary | ICD-10-CM | POA: Diagnosis not present

## 2019-09-17 ENCOUNTER — Other Ambulatory Visit: Payer: Self-pay | Admitting: Family Medicine

## 2019-09-17 DIAGNOSIS — I10 Essential (primary) hypertension: Secondary | ICD-10-CM

## 2019-09-17 MED FILL — FENOFIBRATE 145 MG TABLET: 145 | 30 days supply | Qty: 30 | Fill #1

## 2019-09-17 MED FILL — PRADAXA 150 MG CAP: 150 | 30 days supply | Qty: 60 | Fill #0

## 2019-09-19 ENCOUNTER — Ambulatory Visit (HOSPITAL_COMMUNITY): Payer: Medicaid Other | Admitting: Nurse Practitioner

## 2019-09-19 DIAGNOSIS — Z5181 Encounter for therapeutic drug level monitoring: Secondary | ICD-10-CM | POA: Diagnosis not present

## 2019-09-26 ENCOUNTER — Other Ambulatory Visit: Payer: Self-pay | Admitting: Family Medicine

## 2019-09-26 DIAGNOSIS — Z5181 Encounter for therapeutic drug level monitoring: Secondary | ICD-10-CM | POA: Diagnosis not present

## 2019-09-26 DIAGNOSIS — I10 Essential (primary) hypertension: Secondary | ICD-10-CM

## 2019-09-26 MED FILL — PRADAXA 150 MG CAP: 150 | 30 days supply | Qty: 60 | Fill #0

## 2019-09-26 MED FILL — FENOFIBRATE 145 MG TABLET: 145 | 30 days supply | Qty: 30 | Fill #1

## 2019-10-01 ENCOUNTER — Other Ambulatory Visit: Payer: Self-pay | Admitting: Pharmacist

## 2019-10-01 DIAGNOSIS — I10 Essential (primary) hypertension: Secondary | ICD-10-CM

## 2019-10-01 MED ORDER — AMLODIPINE BESYLATE 10 MG PO TABS: 10.0000 mg | ORAL_TABLET | Freq: Every day | ORAL | 0 refills | Status: DC

## 2019-10-01 MED ORDER — FUROSEMIDE 20 MG PO TABS: 20.0000 mg | ORAL_TABLET | Freq: Every day | ORAL | 0 refills | Status: DC

## 2019-10-01 MED FILL — AMLODIPINE BESYLATE 10 MG T: 10 | 30 days supply | Qty: 30 | Fill #0

## 2019-10-01 MED FILL — FUROSEMIDE 20 MG TABS: 20 | 30 days supply | Qty: 30 | Fill #0

## 2019-10-02 DIAGNOSIS — Z5181 Encounter for therapeutic drug level monitoring: Secondary | ICD-10-CM | POA: Diagnosis not present

## 2019-10-04 ENCOUNTER — Other Ambulatory Visit: Payer: Self-pay | Admitting: Family Medicine

## 2019-10-04 DIAGNOSIS — I10 Essential (primary) hypertension: Secondary | ICD-10-CM

## 2019-10-04 DIAGNOSIS — E1142 Type 2 diabetes mellitus with diabetic polyneuropathy: Secondary | ICD-10-CM

## 2019-10-04 DIAGNOSIS — E118 Type 2 diabetes mellitus with unspecified complications: Secondary | ICD-10-CM

## 2019-10-04 MED FILL — CARVEDILOL 3.125 MG TABLET: 3.125 | 30 days supply | Qty: 60 | Fill #0

## 2019-10-08 ENCOUNTER — Other Ambulatory Visit: Payer: Self-pay

## 2019-10-08 ENCOUNTER — Encounter (HOSPITAL_COMMUNITY): Payer: Self-pay | Admitting: Nurse Practitioner

## 2019-10-08 ENCOUNTER — Ambulatory Visit (HOSPITAL_COMMUNITY)
Admission: RE | Admit: 2019-10-08 | Discharge: 2019-10-08 | Disposition: A | Discharge status: Home / Self Care | Payer: Medicaid Other | Source: Ambulatory Visit | Attending: Nurse Practitioner | Admitting: Nurse Practitioner

## 2019-10-08 VITALS — BP 128/76 | HR 73 | Ht 66.0 in | Wt 179.6 lb

## 2019-10-08 DIAGNOSIS — E118 Type 2 diabetes mellitus with unspecified complications: Secondary | ICD-10-CM | POA: Insufficient documentation

## 2019-10-08 DIAGNOSIS — Z7984 Long term (current) use of oral hypoglycemic drugs: Secondary | ICD-10-CM | POA: Diagnosis not present

## 2019-10-08 DIAGNOSIS — D509 Iron deficiency anemia, unspecified: Secondary | ICD-10-CM | POA: Insufficient documentation

## 2019-10-08 DIAGNOSIS — Z7901 Long term (current) use of anticoagulants: Secondary | ICD-10-CM | POA: Diagnosis not present

## 2019-10-08 DIAGNOSIS — K219 Gastro-esophageal reflux disease without esophagitis: Secondary | ICD-10-CM | POA: Diagnosis not present

## 2019-10-08 DIAGNOSIS — I10 Essential (primary) hypertension: Secondary | ICD-10-CM | POA: Insufficient documentation

## 2019-10-08 DIAGNOSIS — D6869 Other thrombophilia: Secondary | ICD-10-CM

## 2019-10-08 DIAGNOSIS — I480 Paroxysmal atrial fibrillation: Secondary | ICD-10-CM | POA: Diagnosis not present

## 2019-10-08 DIAGNOSIS — Z79899 Other long term (current) drug therapy: Secondary | ICD-10-CM | POA: Diagnosis not present

## 2019-10-08 DIAGNOSIS — R011 Cardiac murmur, unspecified: Secondary | ICD-10-CM | POA: Insufficient documentation

## 2019-10-08 MED FILL — GABAPENTIN 300 MG CAPSULE: 300 | 30 days supply | Qty: 90 | Fill #0

## 2019-10-08 NOTE — Progress Notes (Signed)
Patient ID: Jane Arellano, female   DOB: 11/04/1961, 58 y.o.   MRN: [id number]     Primary Care Physician: Antony Blackbird, MD Referring Physician: Dr. Drue Second Jane Arellano is a 58 y.o. female with a h/o PAF in the afib clinic for evaluation. Dx'ed 05/05/15, asymtpomatic, picked up on physical exam. Apparently very little afib burden since then.  She reports that she feels well other than dealing with peripheral neuropathy from her DM. She has not noticed any reoccurring afib. Continues on DOAC with a   CHA2DS2-VASc Score of at least 4.   Lifestyle habits reviewed and pt reports very little caffeine, no alcohol, walks daily. Denies snoring. Discussed avoiding decongestants going into cold/flu season.  Today, she denies symptoms of palpitations, chest pain, shortness of breath, orthopnea, PND, lower extremity edema, dizziness, presyncope, syncope, or neurologic sequela. The patient is tolerating medications without difficulties and is otherwise without complaint today.   Past Medical History:  Diagnosis Date  . Atypical chest pain    a. 11/2005 Negative Myoview  . Diastolic dysfunction   . DM2 (diabetes mellitus, type 2) ([place])   . Dysrhythmia    AFIB HX CV   . GERD (gastroesophageal reflux disease)   . Heart murmur   . History of substance abuse ([place])   . HTN (hypertension)   . Iron deficiency anemia   . Paroxysmal atrial fibrillation ([place]) 05/01/2013   On Xarelto  . Subaortic membrane    a. 01/2010 Echo: EF 55-60%, No rwma, subaortic membrane with elevated LVOT mean gradient of 21 mmHg, Triv AI, mod dil LA, mildly increased PASP. b. 05/02/2013 Echo:  LVEF 44-01%, grade 1 diastolic dysfunction, mild LVH, subaortic stenosis w/ turbulation in LVOT c/w subaortic membrane (mean gradient 42 mmHg/peak gradient 81 mmHg), mild biatrial enlargement   Past Surgical History:  Procedure Laterality Date  . CARDIOVERSION N/A 11/04/2013   Procedure: CARDIOVERSION;  Surgeon: Peter M [place], MD;   Location: [facility] ENDOSCOPY;  Service: Cardiovascular;  Laterality: N/A;  . CESAREAN SECTION      Current Outpatient Medications  Medication Sig Dispense Refill  . ACCU-CHEK FASTCLIX LANCETS MISC USE AS DIRECTED 100 each 0  . alum & mag hydroxide-simeth (MAALOX/MYLANTA) 200-200-20 MG/5ML suspension Take 15 mLs by mouth every 6 (six) hours as needed for indigestion or heartburn. 355 mL 0  . amitriptyline (ELAVIL) 50 MG tablet TAKE 1 TABLET (50 MG TOTAL) BY MOUTH AT BEDTIME. 30 tablet 0  . amLODipine (NORVASC) 10 MG tablet Take 1 tablet (10 mg total) by mouth daily. 30 tablet 0  . Blood Glucose Calibration (ACCU-CHEK AVIVA) SOLN USE AS INSTRUCTED 1 each 0  . Blood Glucose Monitoring Suppl (ACCU-CHEK AVIVA PLUS) w/Device KIT USE AS DIRECTED TWICE DAILY AT 8 AM AND 10 PM 1 kit 0  . Calcium Carb-Cholecalciferol (CALCIUM-VITAMIN D3) 500-400 MG-UNIT TABS Take 1 tablet by mouth daily. 60 tablet 5  . carvedilol (COREG) 3.125 MG tablet TAKE 1 TABLET (3.125 MG TOTAL) BY MOUTH 2 (TWO) TIMES DAILY. 60 tablet 0  . cetirizine (ZYRTEC) 10 MG tablet Take 1 tablet (10 mg total) by mouth daily. As needed for nasal congestion 30 tablet 3  . dabigatran (PRADAXA) 150 MG CAPS capsule Take 1 capsule (150 mg total) by mouth 2 (two) times daily. Must have office visit for refills. 60 capsule 0  . fenofibrate (TRICOR) 145 MG tablet TAKE 1 TABLET BY MOUTH DAILY. 30 tablet 2  . ferrous sulfate 325 (65 FE) MG tablet Take 1  tablet (325 mg total) by mouth 3 (three) times daily with meals. 90 tablet 3  . furosemide (LASIX) 20 MG tablet Take 1 tablet (20 mg total) by mouth daily. 30 tablet 0  . gabapentin (NEURONTIN) 300 MG capsule TAKE 1 CAPSULE BY MOUTH 3 TIMES DAILY. 90 capsule 1  . GAVILAX powder MIX 1 CAPFUL WITH LIQUID AND DRINK ONCE DAILY 476 g 2  . glucose blood (ACCU-CHEK AVIVA PLUS) test strip USE AS INSTRUCTED TO CHECK BLOOD SUGAR UP TO 3 TIMES DAILY 100 each prn  . hydrOXYzine (ATARAX/VISTARIL) 25 MG tablet TAKE 1 TABLET  BY MOUTH 3 TIMES DAILY AS NEEDED FOR ANXIETY. 60 tablet 2  . metFORMIN (GLUCOPHAGE) 1000 MG tablet TAKE 1 TABLET BY MOUTH 2 TIMES DAILY WITH A MEAL. 180 tablet 0  . Multiple Vitamin (MULTIVITAMIN WITH MINERALS) TABS tablet Take 1 tablet by mouth daily.    . Omega-3 Fatty Acids (FISH OIL) 1000 MG CPDR Take 3 g by mouth daily. 90 capsule 6  . omeprazole (PRILOSEC) 40 MG capsule Take 1 capsule (40 mg total) by mouth daily. 90 capsule 3   No current facility-administered medications for this encounter.     Allergies  Allergen Reactions  . Xarelto [Rivaroxaban] Swelling    Gums bled, headaches  . Ketorolac Tromethamine Other (See Comments)    Unknown reaction    Social History   Socioeconomic History  . Marital status: Single    Spouse name: Not on file  . Number of children: Not on file  . Years of education: Not on file  . Highest education level: Not on file  Occupational History  . Not on file  Social Needs  . Financial resource strain: Not on file  . Food insecurity    Worry: Not on file    Inability: Not on file  . Transportation needs    Medical: Not on file    Non-medical: Not on file  Tobacco Use  . Smoking status: Never Smoker  . Smokeless tobacco: Current User    Types: Snuff  Substance and Sexual Activity  . Alcohol use: Yes    Alcohol/week: 1.0 - 2.0 standard drinks    Types: 1 - 2 Glasses of wine per week  . Drug use: No    Types: Marijuana    Comment: Smokes marijuana weekly.  Has not used cocaine in 9 years.  . Sexual activity: Yes    Birth control/protection: None  Lifestyle  . Physical activity    Days per week: Not on file    Minutes per session: Not on file  . Stress: Not on file  Relationships  . Social Herbalist on phone: Not on file    Gets together: Not on file    Attends religious service: Not on file    Active member of club or organization: Not on file    Attends meetings of clubs or organizations: Not on file     Relationship status: Not on file  . Intimate partner violence    Fear of current or ex partner: Not on file    Emotionally abused: Not on file    Physically abused: Not on file    Forced sexual activity: Not on file  Other Topics Concern  . Not on file  Social History Narrative   Lives in [place] by herself.  She had been caring for her mother but she died 2 mos ago.  She tries to remain active but does not regularly  exercise.    Family History  Problem Relation Age of Onset  . Diabetes Father        deceased  . Cancer Mother 42       colon  . Stroke Mother   . Hypertension Sister        alive and well  . Hypertension Sister        alive and well  . Heart attack Brother        deceased @ 13    ROS- All systems are reviewed and negative except as per the HPI above  Physical Exam: Vitals:   10/08/19 1032  BP: 128/76  Pulse: 73  Weight: 81.5 kg  Height: '5\' 6"'$  (1.676 m)    GEN- The patient is well appearing, alert and oriented x 3 today.   Head- normocephalic, atraumatic Eyes-  Sclera clear, conjunctiva pink Ears- hearing intact Oropharynx- clear Neck- supple, no JVP Lymph- no cervical lymphadenopathy Lungs- Clear to ausculation bilaterally, normal work of breathing Heart- Regular rate and rhythm, 2/6 systolic murmiur murmurs, rubs or gallops, PMI not laterally displaced GI- soft, NT, ND, + BS Extremities- no clubbing, cyanosis, or edema MS- no significant deformity or atrophy Skin- no rash or lesion Psych- euthymic mood, full affect Neuro- strength and sensation are intact  EKG-SR with sinus arrythmia, 73 bpm, qrs int 84 ms, qtc 456 ms Epic records reviewed  Assessment and Plan: 1. PAF No know burden  Continues in SR Continue carvedilol Continue pradaxa for chadsvasc of at least 4, no bleeding issues    2. HTN Stable    Ordered an echo on last visit here in June to f/u with mild aortic  dilation of ascending aorta  and it was not done, for unknown reasons   Will reschedule    F/u in afib clinic in 6 months  Butch Penny C. Tuan Tippin, [facility] [address] [place], [place] [address] [phone]

## 2019-10-09 DIAGNOSIS — Z5181 Encounter for therapeutic drug level monitoring: Secondary | ICD-10-CM | POA: Diagnosis not present

## 2019-10-14 ENCOUNTER — Other Ambulatory Visit: Payer: Self-pay

## 2019-10-14 ENCOUNTER — Ambulatory Visit
Admission: RE | Admit: 2019-10-14 | Discharge: 2019-10-14 | Disposition: A | Discharge status: Home / Self Care | Payer: Medicaid Other | Source: Ambulatory Visit | Attending: Family Medicine | Admitting: Family Medicine

## 2019-10-14 DIAGNOSIS — Z1231 Encounter for screening mammogram for malignant neoplasm of breast: Secondary | ICD-10-CM | POA: Diagnosis not present

## 2019-10-15 ENCOUNTER — Telehealth: Payer: Self-pay | Admitting: *Deleted

## 2019-10-15 ENCOUNTER — Ambulatory Visit (HOSPITAL_COMMUNITY)
Admission: RE | Admit: 2019-10-15 | Discharge: 2019-10-15 | Disposition: A | Discharge status: Home / Self Care | Payer: Medicaid Other | Source: Ambulatory Visit | Attending: Nurse Practitioner | Admitting: Nurse Practitioner

## 2019-10-15 DIAGNOSIS — I082 Rheumatic disorders of both aortic and tricuspid valves: Secondary | ICD-10-CM | POA: Diagnosis not present

## 2019-10-15 DIAGNOSIS — I119 Hypertensive heart disease without heart failure: Secondary | ICD-10-CM | POA: Insufficient documentation

## 2019-10-15 DIAGNOSIS — Z5181 Encounter for therapeutic drug level monitoring: Secondary | ICD-10-CM | POA: Diagnosis not present

## 2019-10-15 DIAGNOSIS — I480 Paroxysmal atrial fibrillation: Secondary | ICD-10-CM | POA: Diagnosis not present

## 2019-10-15 DIAGNOSIS — E119 Type 2 diabetes mellitus without complications: Secondary | ICD-10-CM | POA: Diagnosis not present

## 2019-10-15 NOTE — Progress Notes (Signed)
  Echocardiogram 2D Echocardiogram has been performed.  Jane Arellano 10/15/2019, 12:09 PM

## 2019-10-15 NOTE — Telephone Encounter (Signed)
-----   Message from Antony Blackbird, MD sent at 10/15/2019  4:12 PM EST ----- Normal mammogram

## 2019-10-15 NOTE — Telephone Encounter (Signed)
Medical Assistant left message on patient's home and cell voicemail. Voicemail states to give a call back to [place] with [facility] at [phone]. Patient is aware of mammogram being normal

## 2019-10-16 ENCOUNTER — Encounter (HOSPITAL_COMMUNITY): Payer: Self-pay | Admitting: *Deleted

## 2019-10-22 DIAGNOSIS — Z5181 Encounter for therapeutic drug level monitoring: Secondary | ICD-10-CM | POA: Diagnosis not present

## 2019-10-29 DIAGNOSIS — Z5181 Encounter for therapeutic drug level monitoring: Secondary | ICD-10-CM | POA: Diagnosis not present

## 2019-10-30 ENCOUNTER — Ambulatory Visit: Payer: Medicaid Other | Attending: Family Medicine | Admitting: Family Medicine

## 2019-10-30 ENCOUNTER — Other Ambulatory Visit: Payer: Self-pay

## 2019-10-30 ENCOUNTER — Encounter: Payer: Self-pay | Admitting: Family Medicine

## 2019-10-30 VITALS — BP 133/83 | HR 68 | Temp 98.2°F | Resp 18 | Ht 67.0 in | Wt 187.0 lb

## 2019-10-30 DIAGNOSIS — J309 Allergic rhinitis, unspecified: Secondary | ICD-10-CM | POA: Diagnosis not present

## 2019-10-30 DIAGNOSIS — Z1159 Encounter for screening for other viral diseases: Secondary | ICD-10-CM | POA: Diagnosis not present

## 2019-10-30 DIAGNOSIS — R5383 Other fatigue: Secondary | ICD-10-CM

## 2019-10-30 DIAGNOSIS — Z7902 Long term (current) use of antithrombotics/antiplatelets: Secondary | ICD-10-CM | POA: Diagnosis not present

## 2019-10-30 DIAGNOSIS — F418 Other specified anxiety disorders: Secondary | ICD-10-CM | POA: Diagnosis not present

## 2019-10-30 DIAGNOSIS — Z833 Family history of diabetes mellitus: Secondary | ICD-10-CM | POA: Insufficient documentation

## 2019-10-30 DIAGNOSIS — I509 Heart failure, unspecified: Secondary | ICD-10-CM | POA: Diagnosis not present

## 2019-10-30 DIAGNOSIS — I10 Essential (primary) hypertension: Secondary | ICD-10-CM

## 2019-10-30 DIAGNOSIS — E1142 Type 2 diabetes mellitus with diabetic polyneuropathy: Secondary | ICD-10-CM | POA: Diagnosis not present

## 2019-10-30 DIAGNOSIS — Z7901 Long term (current) use of anticoagulants: Secondary | ICD-10-CM | POA: Insufficient documentation

## 2019-10-30 DIAGNOSIS — K219 Gastro-esophageal reflux disease without esophagitis: Secondary | ICD-10-CM

## 2019-10-30 DIAGNOSIS — Z79899 Other long term (current) drug therapy: Secondary | ICD-10-CM | POA: Diagnosis not present

## 2019-10-30 DIAGNOSIS — I110 Hypertensive heart disease with heart failure: Secondary | ICD-10-CM | POA: Diagnosis not present

## 2019-10-30 DIAGNOSIS — E118 Type 2 diabetes mellitus with unspecified complications: Secondary | ICD-10-CM

## 2019-10-30 DIAGNOSIS — E785 Hyperlipidemia, unspecified: Secondary | ICD-10-CM

## 2019-10-30 DIAGNOSIS — I480 Paroxysmal atrial fibrillation: Secondary | ICD-10-CM

## 2019-10-30 DIAGNOSIS — Z7984 Long term (current) use of oral hypoglycemic drugs: Secondary | ICD-10-CM | POA: Diagnosis not present

## 2019-10-30 DIAGNOSIS — J0140 Acute pansinusitis, unspecified: Secondary | ICD-10-CM

## 2019-10-30 LAB — POCT GLYCOSYLATED HEMOGLOBIN (HGB A1C): Hemoglobin A1C: 6 % — AB (ref 4.0–5.6)

## 2019-10-30 LAB — GLUCOSE, POCT (MANUAL RESULT ENTRY): POC Glucose: 140 mg/dL — AB (ref 70–99)

## 2019-10-30 MED ORDER — POLYETHYLENE GLYCOL 3350 17 GM/SCOOP PO POWD: ORAL | 6 refills | Status: DC

## 2019-10-30 MED ORDER — CARVEDILOL 3.125 MG PO TABS: 3.1250 mg | ORAL_TABLET | Freq: Two times a day (BID) | ORAL | 3 refills | Status: DC

## 2019-10-30 MED ORDER — FUROSEMIDE 20 MG PO TABS: 20.0000 mg | ORAL_TABLET | Freq: Every day | ORAL | 0 refills | Status: DC

## 2019-10-30 MED ORDER — AMLODIPINE BESYLATE 10 MG PO TABS: 10.0000 mg | ORAL_TABLET | Freq: Every day | ORAL | 0 refills | Status: DC

## 2019-10-30 MED ORDER — ALUM & MAG HYDROXIDE-SIMETH 200-200-20 MG/5ML PO SUSP: 15.0000 mL | Freq: Four times a day (QID) | ORAL | 5 refills | Status: DC | PRN

## 2019-10-30 MED ORDER — GABAPENTIN 300 MG PO CAPS: ORAL_CAPSULE | ORAL | 1 refills | Status: DC

## 2019-10-30 MED ORDER — FENOFIBRATE 145 MG PO TABS: 145.0000 mg | ORAL_TABLET | Freq: Every day | ORAL | 1 refills | Status: DC

## 2019-10-30 MED ORDER — OMEPRAZOLE 40 MG PO CPDR: 40.0000 mg | DELAYED_RELEASE_CAPSULE | Freq: Every day | ORAL | 3 refills | Status: DC

## 2019-10-30 MED ORDER — FERROUS SULFATE 325 (65 FE) MG PO TABS: ORAL_TABLET | ORAL | 3 refills | Status: DC

## 2019-10-30 MED ORDER — METFORMIN HCL 1000 MG PO TABS: ORAL_TABLET | ORAL | 3 refills | Status: DC

## 2019-10-30 MED ORDER — AMITRIPTYLINE HCL 50 MG PO TABS: 50.0000 mg | ORAL_TABLET | Freq: Every day | ORAL | 5 refills | Status: DC

## 2019-10-30 MED ORDER — DABIGATRAN ETEXILATE MESYLATE 150 MG PO CAPS: 150.0000 mg | ORAL_CAPSULE | Freq: Two times a day (BID) | ORAL | 0 refills | Status: DC

## 2019-10-30 MED ORDER — HYDROXYZINE HCL 25 MG PO TABS: ORAL_TABLET | ORAL | 5 refills | Status: DC

## 2019-10-30 MED ORDER — CETIRIZINE HCL 10 MG PO TABS: 10.0000 mg | ORAL_TABLET | Freq: Every day | ORAL | 3 refills | Status: DC

## 2019-10-30 MED FILL — FENOFIBRATE 145 MG TABLET: 145 | 30 days supply | Qty: 30 | Fill #2

## 2019-10-30 NOTE — Progress Notes (Signed)
Established Patient Office Visit  Subjective:  Patient ID: Jane Arellano, female    DOB: 1961-09-28  Age: 58 y.o. MRN: [id number]  CC: No chief complaint on file.   HPI [name] , 58 year old female with multiple medical issues including type 2 diabetes, hypertension, history of atrial fibrillation, diastolic dysfunction, dyslipidemia, GERD, anemia, anxiety and depression and atypical chest pain who was last seen for telehealth visit on 06/12/2019.  She did see cardiology/A. fib clinic since her last visit and also had echocardiogram.  She reports that her heart rate and blood pressure remain controlled on her current medications.  She continues to take Pradaxa and denies any unusual bruising or bleeding.  She continues to take omeprazole for stomach protection and prevention of acid reflux.  No current abdominal pain-no nausea or vomiting.  No blood in the stool or black stools.  She denies headaches or dizziness related to her blood pressure but she has had recent issues with facial pressure, frontal headache, sinus congestion and postnasal drainage.  She believes that she likely has a sinus infection.  She denies sore throat, no fever or chills, no cough or shortness of breath.      She continues to take Glucophage for diabetes.  Blood sugars have been well controlled and are rarely higher than 115 fasting.  She denies any significant hypoglycemic episodes.  She continues to have chronic issues with painful numbness in her feet/peripheral neuropathy for which she continues to take gabapentin and Elavil.  The combination of medications helps her sleep and keeps her pain to about a 4 on a 0-to-10 scale.  She denies any issues with urinary frequency, blurred vision or increased thirst related to her diabetes. She is currently on fenofibrate to help with her triglycerides as well as omega-3 fatty acids.  She is following a low-fat, low carbohydrate diet.  She has had recent fatigue with onset of  nasal congestion and postnasal drainage and occasional yellow-green nasal discharge.  Other than the recent increase in congestion and facial pressure, she feels that she is doing well at this time.  She also continues to take Lasix for peripheral edema/CHF which is controlled.  She would also like refill of hydroxyzine for anxiety.  Past Medical History:  Diagnosis Date  . Atypical chest pain    a. 11/2005 Negative Myoview  . Diastolic dysfunction   . DM2 (diabetes mellitus, type 2) ([place])   . Dysrhythmia    AFIB HX CV   . GERD (gastroesophageal reflux disease)   . Heart murmur   . History of substance abuse ([place])   . HTN (hypertension)   . Iron deficiency anemia   . Paroxysmal atrial fibrillation ([place]) 05/01/2013   On Xarelto  . Subaortic membrane    a. 01/2010 Echo: EF 55-60%, No rwma, subaortic membrane with elevated LVOT mean gradient of 21 mmHg, Triv AI, mod dil LA, mildly increased PASP. b. 05/02/2013 Echo:  LVEF 20-35%, grade 1 diastolic dysfunction, mild LVH, subaortic stenosis w/ turbulation in LVOT c/w subaortic membrane (mean gradient 42 mmHg/peak gradient 81 mmHg), mild biatrial enlargement    Past Surgical History:  Procedure Laterality Date  . CARDIOVERSION N/A 11/04/2013   Procedure: CARDIOVERSION;  Surgeon: Peter M Martinique, MD;  Location: [facility] ENDOSCOPY;  Service: Cardiovascular;  Laterality: N/A;  . CESAREAN SECTION      Family History  Problem Relation Age of Onset  . Diabetes Father        deceased  . Cancer Mother  85       colon  . Stroke Mother   . Hypertension Sister        alive and well  . Hypertension Sister        alive and well  . Heart attack Brother        deceased @ 48    Social History   Socioeconomic History  . Marital status: Single    Spouse name: Not on file  . Number of children: Not on file  . Years of education: Not on file  . Highest education level: Not on file  Occupational History  . Not on file  Social Needs  . Financial  resource strain: Not on file  . Food insecurity    Worry: Not on file    Inability: Not on file  . Transportation needs    Medical: Not on file    Non-medical: Not on file  Tobacco Use  . Smoking status: Never Smoker  . Smokeless tobacco: Current User    Types: Snuff  Substance and Sexual Activity  . Alcohol use: Yes    Alcohol/week: 1.0 - 2.0 standard drinks    Types: 1 - 2 Glasses of wine per week  . Drug use: No    Types: Marijuana    Comment: Smokes marijuana weekly.  Has not used cocaine in 9 years.  . Sexual activity: Yes    Birth control/protection: None  Lifestyle  . Physical activity    Days per week: Not on file    Minutes per session: Not on file  . Stress: Not on file  Relationships  . Social Herbalist on phone: Not on file    Gets together: Not on file    Attends religious service: Not on file    Active member of club or organization: Not on file    Attends meetings of clubs or organizations: Not on file    Relationship status: Not on file  . Intimate partner violence    Fear of current or ex partner: Not on file    Emotionally abused: Not on file    Physically abused: Not on file    Forced sexual activity: Not on file  Other Topics Concern  . Not on file  Social History Narrative   Lives in [place] by herself.  She had been caring for her mother but she died 2 mos ago.  She tries to remain active but does not regularly exercise.    Outpatient Medications Prior to Visit  Medication Sig Dispense Refill  . ACCU-CHEK FASTCLIX LANCETS MISC USE AS DIRECTED 100 each 0  . alum & mag hydroxide-simeth (MAALOX/MYLANTA) 200-200-20 MG/5ML suspension Take 15 mLs by mouth every 6 (six) hours as needed for indigestion or heartburn. 355 mL 0  . amitriptyline (ELAVIL) 50 MG tablet TAKE 1 TABLET (50 MG TOTAL) BY MOUTH AT BEDTIME. 30 tablet 0  . amLODipine (NORVASC) 10 MG tablet Take 1 tablet (10 mg total) by mouth daily. 30 tablet 0  . Blood Glucose Calibration  (ACCU-CHEK AVIVA) SOLN USE AS INSTRUCTED 1 each 0  . Blood Glucose Monitoring Suppl (ACCU-CHEK AVIVA PLUS) w/Device KIT USE AS DIRECTED TWICE DAILY AT 8 AM AND 10 PM 1 kit 0  . Calcium Carb-Cholecalciferol (CALCIUM-VITAMIN D3) 500-400 MG-UNIT TABS Take 1 tablet by mouth daily. 60 tablet 5  . carvedilol (COREG) 3.125 MG tablet TAKE 1 TABLET (3.125 MG TOTAL) BY MOUTH 2 (TWO) TIMES DAILY. 60 tablet 0  .  cetirizine (ZYRTEC) 10 MG tablet Take 1 tablet (10 mg total) by mouth daily. As needed for nasal congestion 30 tablet 3  . dabigatran (PRADAXA) 150 MG CAPS capsule Take 1 capsule (150 mg total) by mouth 2 (two) times daily. Must have office visit for refills. 60 capsule 0  . fenofibrate (TRICOR) 145 MG tablet TAKE 1 TABLET BY MOUTH DAILY. 30 tablet 2  . ferrous sulfate 325 (65 FE) MG tablet Take 1 tablet (325 mg total) by mouth 3 (three) times daily with meals. 90 tablet 3  . furosemide (LASIX) 20 MG tablet Take 1 tablet (20 mg total) by mouth daily. 30 tablet 0  . gabapentin (NEURONTIN) 300 MG capsule TAKE 1 CAPSULE BY MOUTH 3 TIMES DAILY. 90 capsule 1  . GAVILAX powder MIX 1 CAPFUL WITH LIQUID AND DRINK ONCE DAILY 476 g 2  . glucose blood (ACCU-CHEK AVIVA PLUS) test strip USE AS INSTRUCTED TO CHECK BLOOD SUGAR UP TO 3 TIMES DAILY 100 each prn  . hydrOXYzine (ATARAX/VISTARIL) 25 MG tablet TAKE 1 TABLET BY MOUTH 3 TIMES DAILY AS NEEDED FOR ANXIETY. 60 tablet 2  . metFORMIN (GLUCOPHAGE) 1000 MG tablet TAKE 1 TABLET BY MOUTH 2 TIMES DAILY WITH A MEAL. 180 tablet 0  . Multiple Vitamin (MULTIVITAMIN WITH MINERALS) TABS tablet Take 1 tablet by mouth daily.    . Omega-3 Fatty Acids (FISH OIL) 1000 MG CPDR Take 3 g by mouth daily. 90 capsule 6  . omeprazole (PRILOSEC) 40 MG capsule Take 1 capsule (40 mg total) by mouth daily. 90 capsule 3   No facility-administered medications prior to visit.     Allergies  Allergen Reactions  . Xarelto [Rivaroxaban] Swelling    Gums bled, headaches  . Ketorolac  Tromethamine Other (See Comments)    Unknown reaction    ROS Review of Systems  Constitutional: Positive for fatigue. Negative for chills and fever.  HENT: Positive for congestion, postnasal drip and sinus pressure. Negative for ear pain, nosebleeds, sore throat and trouble swallowing.   Eyes: Negative for photophobia and visual disturbance.  Respiratory: Negative for cough and shortness of breath.   Gastrointestinal: Positive for constipation (Occasional for which she takes generic MiraLAX). Negative for abdominal pain, blood in stool, diarrhea and nausea.  Endocrine: Negative for cold intolerance, heat intolerance, polydipsia, polyphagia and polyuria.  Genitourinary: Negative for dysuria and frequency.  Musculoskeletal: Positive for gait problem (neuropathy). Negative for arthralgias and back pain.  Skin: Negative for rash and wound.  Neurological: Negative for dizziness and headaches.       Recent frontal headaches that she believes are sinus related otherwise no headache issues  Hematological: Negative for adenopathy. Does not bruise/bleed easily.  Psychiatric/Behavioral: Negative for sleep disturbance. The patient is not nervous/anxious.       Objective:    Physical Exam  Constitutional: She is oriented to person, place, and time. She appears well-developed and well-nourished.  Neck: No JVD present. No thyromegaly present.  Cardiovascular: Normal rate.  Pulmonary/Chest: Effort normal and breath sounds normal.  Abdominal: Soft. There is no abdominal tenderness. There is no rebound and no guarding.  Musculoskeletal:        General: No tenderness or edema.     Cervical back: Normal range of motion and neck supple.  Lymphadenopathy:    She has no cervical adenopathy.  Neurological: She is alert and oriented to person, place, and time.  Skin: Skin is warm and dry.  Psychiatric: She has a normal mood and affect. Her behavior  is normal.  Nursing note and vitals reviewed. Diabetic  foot exam-no active skin breakdown on the feet, presence of dry skin/thickened skin on the heels and balls of the feet.  Decreased monofilament sensation on exam.  1+ dorsalis pedis and posterior tibial pulses.  Good nail care.  BP 133/83 (BP Location: Left Arm, Patient Position: Sitting, Cuff Size: Normal)   Pulse 68   Temp 98.2 F (36.8 C) (Oral)   Resp 18   Ht '5\' 7"'$  (1.702 m)   Wt 187 lb (84.8 kg)   SpO2 100%   BMI 29.29 kg/m  Wt Readings from Last 3 Encounters:  10/08/19 179 lb 9.6 oz (81.5 kg)  04/30/19 192 lb (87.1 kg)  04/02/19 190 lb (86.2 kg)     Health Maintenance Due  Topic Date Due  . Hepatitis C Screening  11/18/1961  . OPHTHALMOLOGY EXAM  07/16/2015  . FOOT EXAM  12/11/2015  . URINE MICROALBUMIN  09/29/2019      Lab Results  Component Value Date   TSH 2.630 11/03/2017   Lab Results  Component Value Date   WBC 5.0 06/27/2019   HGB 12.6 06/27/2019   HCT 37.3 06/27/2019   MCV 100 (H) 06/27/2019   PLT 243 06/27/2019   Lab Results  Component Value Date   NA 147 (H) 06/27/2019   K 4.3 06/27/2019   CO2 23 06/27/2019   GLUCOSE 120 (H) 06/27/2019   BUN 10 06/27/2019   CREATININE 0.89 06/27/2019   BILITOT 0.3 06/27/2019   ALKPHOS 44 06/27/2019   AST 15 06/27/2019   ALT 11 06/27/2019   PROT 7.8 06/27/2019   ALBUMIN 4.9 06/27/2019   CALCIUM 10.1 06/27/2019   ANIONGAP 9 07/07/2018   GFR 120.29 10/30/2013   Lab Results  Component Value Date   CHOL 127 06/27/2019   Lab Results  Component Value Date   HDL 59 06/27/2019   Lab Results  Component Value Date   LDLCALC 39 06/27/2019   Lab Results  Component Value Date   TRIG 145 06/27/2019   Lab Results  Component Value Date   CHOLHDL 2.2 06/27/2019   Lab Results  Component Value Date   HGBA1C 6.2 (H) 06/27/2019      Assessment & Plan:  1. Diabetic polyneuropathy associated with type 2 diabetes mellitus ([place]) She reports continued issues with polyneuropathy associated with type 2  diabetes.  Patient will have vitamin B12 level, comprehensive metabolic panel done in follow-up of neuropathy as well as hemoglobin A1c and microalbumin/creatinine ratio in follow-up of diabetes.  Refills provided for Elavil and gabapentin and diabetic foot care discussed - Comprehensive metabolic panel - Vitamin G28 - Microalbumin/Creatinine Ratio, Urine - amitriptyline (ELAVIL) 50 MG tablet; Take 1 tablet (50 mg total) by mouth at bedtime.  Dispense: 30 tablet; Refill: 5 - gabapentin (NEURONTIN) 300 MG capsule; One pill twice daily and 2 pills at bedtime  Dispense: 360 capsule; Refill: 1  2. Essential hypertension Blood pressure stable and well-controlled.  Refill provided of amlodipine, Lasix and carvedilol - amLODipine (NORVASC) 10 MG tablet; Take 1 tablet (10 mg total) by mouth daily.  Dispense: 30 tablet; Refill: 0 - carvedilol (COREG) 3.125 MG tablet; Take 1 tablet (3.125 mg total) by mouth 2 (two) times daily.  Dispense: 180 tablet; Refill: 3 - dabigatran (PRADAXA) 150 MG CAPS capsule; Take 1 capsule (150 mg total) by mouth 2 (two) times daily. Must have office visit for refills.  Dispense: 60 capsule; Refill: 0 - furosemide (LASIX) 20 MG  tablet; Take 1 tablet (20 mg total) by mouth daily.  Dispense: 30 tablet; Refill: 0  3. Type 2 diabetes mellitus with complication, without long-term current use of insulin ([place]) She reports continued good control of diabetes.  Glucose 140 at today's visit.  She will have hemoglobin A1c, comprehensive metabolic panel and urine microalbumin/creatinine ratio in follow-up.  Continue Metformin.  Referral made for diabetic eye exam.  Diabetic foot exam done at today's visit and discussed importance of diabetic foot exams and diabetic foot care. - HgB A1c - Glucose (CBG) - gabapentin (NEURONTIN) 300 MG capsule; One pill twice daily and 2 pills at bedtime  Dispense: 360 capsule; Refill: 1 - metFORMIN (GLUCOPHAGE) 1000 MG tablet; TAKE 1 TABLET BY MOUTH 2 TIMES  DAILY WITH A MEAL.  Dispense: 180 tablet; Refill: 3 - Ambulatory referral to Ophthalmology  4. Long term (current) use of antithrombotics/antiplatelets CBC in follow-up of use of Pradaxa for prevention of embolic CVA secondary to paroxysmal atrial fibrillation.  Patient had headaches and bleeding of the gums with the use of Xarelto which had to be discontinued. - CBC  5. Encounter for long-term (current) use of medications Patient will have comprehensive metabolic panel in follow-up of long-term use of medications for the treatment of hyperlipidemia, diabetes, hypertension and atrial fibrillation - Comprehensive metabolic panel  6. PAF (paroxysmal atrial fibrillation) ([place]) She appears to be in normal sinus rhythm at today's visit and rate is controlled.  Continue use of current medications including carvedilol and Pradaxa.  CBC in follow-up of Pradaxa use. - CBC  7. Fatigue, unspecified type Patient with complaint of fatigue but also likely has sinusitis, increased issues with allergic rhinitis.  In addition to comprehensive metabolic panel and CBC, will also obtain vitamin D level to look for vitamin D deficiency and follow-up of patient's complaint of fatigue. - Vitamin D, 25-hydroxy  8. Need for hepatitis C screening test Patient was offered and agreed to have hepatitis C screening based on CDC guidelines due to her due decade of birth. - Hepatitis C antibody  9.  Allergic rhinitis Use of over-the-counter Mucinex, warm compresses to the facial/sinus area and refill of cetirizine to help with facial pressure and allergic rhinitis.  Call or return if symptoms are not improving or if fever or worsening of symptoms - cetirizine (ZYRTEC) 10 MG tablet; Take 1 tablet (10 mg total) by mouth daily. As needed for nasal congestion  Dispense: 30 tablet; Refill: 3  10. Dyslipidemia Refill of fenofibrate for dyslipidemia and patient is encouraged to follow a low-fat diet - fenofibrate (TRICOR) 145  MG tablet; Take 1 tablet (145 mg total) by mouth daily.  Dispense: 90 tablet; Refill: 1  11. Gastroesophageal reflux disease without esophagitis Refill of omeprazole and continue to eat prior to taking Pradaxa.  Avoid known trigger foods and avoid eating within 2 hours of bedtime.  No anemia on most recent CBC but patient would like to continue use of daily ferrous sulfate due to her issues with fatigue.  New prescription provided. - ferrous sulfate 325 (65 FE) MG tablet; One pill daily after a meal  Dispense: 90 tablet; Refill: 3  12. Depression with anxiety Refill of hydroxyzine to help with anxiety.  Social work referral for counseling was offered which patient declines at this time. - hydrOXYzine (ATARAX/VISTARIL) 25 MG tablet; TAKE 1 TABLET BY MOUTH 3 TIMES DAILY AS NEEDED FOR ANXIETY.  Dispense: 60 tablet; Refill: 5  An After Visit Summary was printed and given to the  patient.  Follow-up: Return in about 5 months (around 03/29/2020) for DM/HTN; sooner if needed.   Antony Blackbird, MD

## 2019-10-30 NOTE — Patient Instructions (Signed)

## 2019-10-31 LAB — COMPREHENSIVE METABOLIC PANEL WITH GFR
ALT: 16 IU/L (ref 0–32)
AST: 19 IU/L (ref 0–40)
Albumin/Globulin Ratio: 1.6 (ref 1.2–2.2)
Albumin: 4.8 g/dL (ref 3.8–4.9)
Alkaline Phosphatase: 48 IU/L (ref 39–117)
BUN/Creatinine Ratio: 13 (ref 9–23)
BUN: 9 mg/dL (ref 6–24)
Bilirubin Total: 0.3 mg/dL (ref 0.0–1.2)
CO2: 24 mmol/L (ref 20–29)
Calcium: 10.4 mg/dL — ABNORMAL HIGH (ref 8.7–10.2)
Chloride: 105 mmol/L (ref 96–106)
Creatinine, Ser: 0.67 mg/dL (ref 0.57–1.00)
GFR calc Af Amer: 111 mL/min/1.73
GFR calc non Af Amer: 97 mL/min/1.73
Globulin, Total: 3 g/dL (ref 1.5–4.5)
Glucose: 124 mg/dL — ABNORMAL HIGH (ref 65–99)
Potassium: 4.1 mmol/L (ref 3.5–5.2)
Sodium: 146 mmol/L — ABNORMAL HIGH (ref 134–144)
Total Protein: 7.8 g/dL (ref 6.0–8.5)

## 2019-10-31 LAB — CBC
Hematocrit: 37.5 % (ref 34.0–46.6)
Hemoglobin: 12.7 g/dL (ref 11.1–15.9)
MCH: 33.9 pg — ABNORMAL HIGH (ref 26.6–33.0)
MCHC: 33.9 g/dL (ref 31.5–35.7)
MCV: 100 fL — ABNORMAL HIGH (ref 79–97)
Platelets: 189 x10E3/uL (ref 150–450)
RBC: 3.75 x10E6/uL — ABNORMAL LOW (ref 3.77–5.28)
RDW: 13.1 % (ref 11.7–15.4)
WBC: 5.4 x10E3/uL (ref 3.4–10.8)

## 2019-10-31 LAB — VITAMIN B12: Vitamin B-12: 619 pg/mL (ref 232–1245)

## 2019-10-31 LAB — MICROALBUMIN / CREATININE URINE RATIO
Creatinine, Urine: 136 mg/dL
Microalb/Creat Ratio: 16 mg/g{creat} (ref 0–29)
Microalbumin, Urine: 22.4 ug/mL

## 2019-10-31 LAB — VITAMIN D 25 HYDROXY (VIT D DEFICIENCY, FRACTURES): Vit D, 25-Hydroxy: 44.5 ng/mL (ref 30.0–100.0)

## 2019-10-31 LAB — HEPATITIS C ANTIBODY: Hep C Virus Ab: <0.1 {s_co_ratio} (ref 0.0–0.9)

## 2019-11-04 DIAGNOSIS — Z5181 Encounter for therapeutic drug level monitoring: Secondary | ICD-10-CM | POA: Diagnosis not present

## 2019-11-18 DIAGNOSIS — Z5181 Encounter for therapeutic drug level monitoring: Secondary | ICD-10-CM | POA: Diagnosis not present

## 2019-12-02 ENCOUNTER — Other Ambulatory Visit: Payer: Self-pay | Admitting: Family Medicine

## 2019-12-02 DIAGNOSIS — Z5181 Encounter for therapeutic drug level monitoring: Secondary | ICD-10-CM | POA: Diagnosis not present

## 2019-12-02 DIAGNOSIS — I10 Essential (primary) hypertension: Secondary | ICD-10-CM

## 2019-12-08 ENCOUNTER — Other Ambulatory Visit: Payer: Self-pay | Admitting: Family Medicine

## 2019-12-08 DIAGNOSIS — I10 Essential (primary) hypertension: Secondary | ICD-10-CM

## 2019-12-09 DIAGNOSIS — Z5181 Encounter for therapeutic drug level monitoring: Secondary | ICD-10-CM | POA: Diagnosis not present

## 2019-12-13 ENCOUNTER — Other Ambulatory Visit: Payer: Self-pay | Admitting: Family Medicine

## 2019-12-13 DIAGNOSIS — I10 Essential (primary) hypertension: Secondary | ICD-10-CM

## 2019-12-16 DIAGNOSIS — Z5181 Encounter for therapeutic drug level monitoring: Secondary | ICD-10-CM | POA: Diagnosis not present

## 2019-12-23 DIAGNOSIS — Z5181 Encounter for therapeutic drug level monitoring: Secondary | ICD-10-CM | POA: Diagnosis not present

## 2019-12-30 DIAGNOSIS — Z5181 Encounter for therapeutic drug level monitoring: Secondary | ICD-10-CM | POA: Diagnosis not present

## 2020-01-06 DIAGNOSIS — Z5181 Encounter for therapeutic drug level monitoring: Secondary | ICD-10-CM | POA: Diagnosis not present

## 2020-01-14 ENCOUNTER — Other Ambulatory Visit: Payer: Self-pay | Admitting: Family Medicine

## 2020-01-14 DIAGNOSIS — I10 Essential (primary) hypertension: Secondary | ICD-10-CM

## 2020-03-01 ENCOUNTER — Other Ambulatory Visit: Payer: Self-pay | Admitting: Family Medicine

## 2020-03-01 DIAGNOSIS — I10 Essential (primary) hypertension: Secondary | ICD-10-CM

## 2020-03-12 DIAGNOSIS — H2513 Age-related nuclear cataract, bilateral: Secondary | ICD-10-CM | POA: Diagnosis not present

## 2020-03-12 DIAGNOSIS — E119 Type 2 diabetes mellitus without complications: Secondary | ICD-10-CM | POA: Diagnosis not present

## 2020-03-12 DIAGNOSIS — H0288B Meibomian gland dysfunction left eye, upper and lower eyelids: Secondary | ICD-10-CM | POA: Diagnosis not present

## 2020-03-12 DIAGNOSIS — H0288A Meibomian gland dysfunction right eye, upper and lower eyelids: Secondary | ICD-10-CM | POA: Diagnosis not present

## 2020-03-26 ENCOUNTER — Ambulatory Visit: Payer: Medicaid Other | Admitting: Internal Medicine

## 2020-04-08 ENCOUNTER — Ambulatory Visit (HOSPITAL_COMMUNITY): Payer: Medicaid Other | Admitting: Nurse Practitioner

## 2020-04-12 ENCOUNTER — Other Ambulatory Visit: Payer: Self-pay | Admitting: Family Medicine

## 2020-04-12 DIAGNOSIS — I10 Essential (primary) hypertension: Secondary | ICD-10-CM

## 2020-04-13 ENCOUNTER — Ambulatory Visit: Payer: Medicaid Other | Attending: Family Medicine | Admitting: Family Medicine

## 2020-04-13 ENCOUNTER — Encounter: Payer: Self-pay | Admitting: Family Medicine

## 2020-04-13 ENCOUNTER — Other Ambulatory Visit: Payer: Self-pay

## 2020-04-13 ENCOUNTER — Other Ambulatory Visit (HOSPITAL_COMMUNITY)
Admission: RE | Admit: 2020-04-13 | Discharge: 2020-04-13 | Disposition: A | Discharge status: Home / Self Care | Payer: Medicaid Other | Source: Ambulatory Visit | Attending: Family Medicine | Admitting: Family Medicine

## 2020-04-13 VITALS — BP 111/69 | HR 71 | Ht 67.0 in | Wt 181.2 lb

## 2020-04-13 DIAGNOSIS — Z7901 Long term (current) use of anticoagulants: Secondary | ICD-10-CM

## 2020-04-13 DIAGNOSIS — E1142 Type 2 diabetes mellitus with diabetic polyneuropathy: Secondary | ICD-10-CM

## 2020-04-13 DIAGNOSIS — Z113 Encounter for screening for infections with a predominantly sexual mode of transmission: Secondary | ICD-10-CM | POA: Diagnosis not present

## 2020-04-13 DIAGNOSIS — Z1231 Encounter for screening mammogram for malignant neoplasm of breast: Secondary | ICD-10-CM | POA: Diagnosis not present

## 2020-04-13 DIAGNOSIS — Z124 Encounter for screening for malignant neoplasm of cervix: Secondary | ICD-10-CM

## 2020-04-13 DIAGNOSIS — I480 Paroxysmal atrial fibrillation: Secondary | ICD-10-CM

## 2020-04-13 DIAGNOSIS — Z114 Encounter for screening for human immunodeficiency virus [HIV]: Secondary | ICD-10-CM

## 2020-04-13 DIAGNOSIS — Z01419 Encounter for gynecological examination (general) (routine) without abnormal findings: Secondary | ICD-10-CM | POA: Insufficient documentation

## 2020-04-13 DIAGNOSIS — E118 Type 2 diabetes mellitus with unspecified complications: Secondary | ICD-10-CM | POA: Diagnosis not present

## 2020-04-13 DIAGNOSIS — Z79899 Other long term (current) drug therapy: Secondary | ICD-10-CM | POA: Diagnosis not present

## 2020-04-13 DIAGNOSIS — E785 Hyperlipidemia, unspecified: Secondary | ICD-10-CM | POA: Diagnosis not present

## 2020-04-13 DIAGNOSIS — I10 Essential (primary) hypertension: Secondary | ICD-10-CM

## 2020-04-13 DIAGNOSIS — D509 Iron deficiency anemia, unspecified: Secondary | ICD-10-CM

## 2020-04-13 DIAGNOSIS — Z0000 Encounter for general adult medical examination without abnormal findings: Secondary | ICD-10-CM

## 2020-04-13 DIAGNOSIS — L659 Nonscarring hair loss, unspecified: Secondary | ICD-10-CM

## 2020-04-13 LAB — POCT GLYCOSYLATED HEMOGLOBIN (HGB A1C): HbA1c, POC (controlled diabetic range): 6.5 % (ref 0.0–7.0)

## 2020-04-13 LAB — GLUCOSE, POCT (MANUAL RESULT ENTRY): POC Glucose: 175 mg/dL — AB (ref 70–99)

## 2020-04-13 NOTE — Patient Instructions (Addendum)
You can try the use of an over the counter vitamin supplement, Biotin, to see if this helps with hair growth.    Health Maintenance, Female Adopting a healthy lifestyle and getting preventive care are important in promoting health and wellness. Ask your health care provider about:  The right schedule for you to have regular tests and exams.  Things you can do on your own to prevent diseases and keep yourself healthy. What should I know about diet, weight, and exercise? Eat a healthy diet   Eat a diet that includes plenty of vegetables, fruits, low-fat dairy products, and lean protein.  Do not eat a lot of foods that are high in solid fats, added sugars, or sodium. Maintain a healthy weight Body mass index (BMI) is used to identify weight problems. It estimates body fat based on height and weight. Your health care provider can help determine your BMI and help you achieve or maintain a healthy weight. Get regular exercise Get regular exercise. This is one of the most important things you can do for your health. Most adults should:  Exercise for at least 150 minutes each week. The exercise should increase your heart rate and make you sweat (moderate-intensity exercise).  Do strengthening exercises at least twice a week. This is in addition to the moderate-intensity exercise.  Spend less time sitting. Even light physical activity can be beneficial. Watch cholesterol and blood lipids Have your blood tested for lipids and cholesterol at 60 years of age, then have this test every 5 years. Have your cholesterol levels checked more often if:  Your lipid or cholesterol levels are high.  You are older than 60 years of age.  You are at high risk for heart disease. What should I know about cancer screening? Depending on your health history and family history, you may need to have cancer screening at various ages. This may include screening for:  Breast cancer.  Cervical  cancer.  Colorectal cancer.  Skin cancer.  Lung cancer. What should I know about heart disease, diabetes, and high blood pressure? Blood pressure and heart disease  High blood pressure causes heart disease and increases the risk of stroke. This is more likely to develop in people who have high blood pressure readings, are of African descent, or are overweight.  Have your blood pressure checked: ? Every 3-5 years if you are 73-25 years of age. ? Every year if you are 48 years old or older. Diabetes Have regular diabetes screenings. This checks your fasting blood sugar level. Have the screening done:  Once every three years after age 16 if you are at a normal weight and have a low risk for diabetes.  More often and at a younger age if you are overweight or have a high risk for diabetes. What should I know about preventing infection? Hepatitis B If you have a higher risk for hepatitis B, you should be screened for this virus. Talk with your health care provider to find out if you are at risk for hepatitis B infection. Hepatitis C Testing is recommended for:  Everyone born from [birth year] through 1966.  Anyone with known risk factors for hepatitis C. Sexually transmitted infections (STIs)  Get screened for STIs, including gonorrhea and chlamydia, if: ? You are sexually active and are younger than 60 years of age. ? You are older than 60 years of age and your health care provider tells you that you are at risk for this type of infection. ? Your sexual  activity has changed since you were last screened, and you are at increased risk for chlamydia or gonorrhea. Ask your health care provider if you are at risk.  Ask your health care provider about whether you are at high risk for HIV. Your health care provider may recommend a prescription medicine to help prevent HIV infection. If you choose to take medicine to prevent HIV, you should first get tested for HIV. You should then be tested every 3  months for as long as you are taking the medicine. Pregnancy  If you are about to stop having your period (premenopausal) and you may become pregnant, seek counseling before you get pregnant.  Take 400 to 800 micrograms (mcg) of folic acid every day if you become pregnant.  Ask for birth control (contraception) if you want to prevent pregnancy. Osteoporosis and menopause Osteoporosis is a disease in which the bones lose minerals and strength with aging. This can result in bone fractures. If you are 30 years old or older, or if you are at risk for osteoporosis and fractures, ask your health care provider if you should:  Be screened for bone loss.  Take a calcium or vitamin D supplement to lower your risk of fractures.  Be given hormone replacement therapy (HRT) to treat symptoms of menopause. Follow these instructions at home: Lifestyle  Do not use any products that contain nicotine or tobacco, such as cigarettes, e-cigarettes, and chewing tobacco. If you need help quitting, ask your health care provider.  Do not use street drugs.  Do not share needles.  Ask your health care provider for help if you need support or information about quitting drugs. Alcohol use  Do not drink alcohol if: ? Your health care provider tells you not to drink. ? You are pregnant, may be pregnant, or are planning to become pregnant.  If you drink alcohol: ? Limit how much you use to 0-1 drink a day. ? Limit intake if you are breastfeeding.  Be aware of how much alcohol is in your drink. In the [place] one drink equals one 12 oz bottle of beer (355 mL), one 5 oz glass of wine (148 mL), or one 1 oz glass of hard liquor (44 mL). General instructions  Schedule regular health, dental, and eye exams.  Stay current with your vaccines.  Tell your health care provider if: ? You often feel depressed. ? You have ever been abused or do not feel safe at home. Summary  Adopting a healthy lifestyle and getting  preventive care are important in promoting health and wellness.  Follow your health care provider's instructions about healthy diet, exercising, and getting tested or screened for diseases.  Follow your health care provider's instructions on monitoring your cholesterol and blood pressure. This information is not intended to replace advice given to you by your health care provider. Make sure you discuss any questions you have with your health care provider. Document Revised: 10/31/2018 Document Reviewed: 10/31/2018 Elsevier Patient Education  2020 [employer].

## 2020-04-13 NOTE — Progress Notes (Signed)
Subjective:  Patient ID: Jane Arellano, female    DOB: Jan 21, 1961  Age: 59 y.o. MRN: [id number]  CC: Annual Exam   HPI Jane Arellano, 59 yo female who presents for annual well exam with GYN exam. Last pap was 2018 and normal. Colonoscopy was done in 2018 and 5 year repeat needed due to family history.  She denies any issues with abdominal pain or blood in the stool/black stools.  She denies any vaginal discharge, no pelvic or vaginal pain.  She would like to have screening for sexually transmitted diseases.  She denies any breast tenderness, no skin changes, no axillary adenopathy and no nipple discharge.  She does perform occasional self breast exams.           She was last seen in the office 10/30/2019 for chronic medical issues and she reports that she would like follow-up of her chronic, ongoing medical issues as well while she is here for her well exam.  She continues to take her medications for hypertension and heart rate control.  She denies headaches or dizziness related to her blood pressure.  Blood pressure has been controlled.  She does wonder if any of her medications may be contributing to issues with hair loss as she reports that she is losing hair at the front of the scalp/sides of the scalp.  She continues to be on blood thinning medication secondary to paroxysmal atrial fibrillation.  She denies any unusual bruising or bleeding.  She does have a history of anemia and has some occasional fatigue.  She has had no sensation of chest pain or palpitations.  No sensation of increased heart rate.  She does have some mild numbness or tingling in her feet related to her diabetes.  She denies any increased thirst or frequent urination.  She states that her blood sugars are controlled with fasting blood sugars generally less than 130 unless she eats things that she knows tend to increase her blood sugars.  She has occasional symptoms associated with low blood sugar she goes too long between  meals.  Overall she feels her blood sugars are well controlled on her current medications.  She tries to follow a low-fat diet and takes medication for her lipids.  She has had no side effects from the medication of which she is aware, unless this is contributing to her hair loss.             Past Medical History:  Diagnosis Date  . Atypical chest pain    a. 11/2005 Negative Myoview  . Diastolic dysfunction   . DM2 (diabetes mellitus, type 2) ([place])   . Dysrhythmia    AFIB HX CV   . GERD (gastroesophageal reflux disease)   . Heart murmur   . History of substance abuse ([place])   . HTN (hypertension)   . Iron deficiency anemia   . Paroxysmal atrial fibrillation ([employer]) 05/01/2013   On Xarelto  . Subaortic membrane    a. 01/2010 Echo: EF 55-60%, No rwma, subaortic membrane with elevated LVOT mean gradient of 21 mmHg, Triv AI, mod dil LA, mildly increased PASP. b. 05/02/2013 Echo:  LVEF 89-21%, grade 1 diastolic dysfunction, mild LVH, subaortic stenosis w/ turbulation in LVOT c/w subaortic membrane (mean gradient 42 mmHg/peak gradient 81 mmHg), mild biatrial enlargement    Past Surgical History:  Procedure Laterality Date  . CARDIOVERSION N/A 11/04/2013   Procedure: CARDIOVERSION;  Surgeon: Peter M Martinique, MD;  Location: [facility];  Service: Cardiovascular;  Laterality: N/A;  . CESAREAN SECTION      Family History  Problem Relation Age of Onset  . Diabetes Father        deceased  . Cancer Mother 15       colon  . Stroke Mother   . Hypertension Sister        alive and well  . Hypertension Sister        alive and well  . Heart attack Brother        deceased @ 35    Social History   Tobacco Use  . Smoking status: Never Smoker  . Smokeless tobacco: Current User    Types: Snuff  Substance Use Topics  . Alcohol use: Yes    Alcohol/week: 1.0 - 2.0 standard drinks    Types: 1 - 2 Glasses of wine per week    ROS Review of Systems  Constitutional: Positive for fatigue (Mild).  Negative for chills and fever.       Hair loss  HENT: Negative for nosebleeds, sore throat and trouble swallowing.   Eyes: Negative for photophobia and visual disturbance.  Respiratory: Negative for cough and shortness of breath.   Cardiovascular: Negative for chest pain, palpitations and leg swelling.  Gastrointestinal: Negative for abdominal pain, blood in stool, constipation, diarrhea and nausea.  Endocrine: Negative for cold intolerance, heat intolerance, polydipsia, polyphagia and polyuria.  Genitourinary: Negative for dysuria, frequency, hematuria, pelvic pain and vaginal pain.  Musculoskeletal: Negative for arthralgias and back pain.  Skin: Positive for wound (Patient with keloiding of prior surgical scar on the chest). Negative for rash.  Neurological: Positive for numbness. Negative for dizziness and headaches.  Hematological: Negative for adenopathy. Does not bruise/bleed easily.  Psychiatric/Behavioral: Negative for self-injury and suicidal ideas.    Objective:   Today's Vitals: BP 111/69   Pulse 71   Ht '5\' 7"'$  (1.702 m)   Wt 181 lb 3.2 oz (82.2 kg)   SpO2 100%   BMI 28.38 kg/m   Physical Exam Vitals and nursing note reviewed. Exam conducted with a chaperone present (CMA present for gynecologic exam).  Constitutional:      General: She is not in acute distress.    Appearance: Normal appearance.     Comments: Well-nourished well-developed older female in no acute distress wearing mask as per office COVID-19 protocol  Neck:     Vascular: No carotid bruit.  Cardiovascular:     Rate and Rhythm: Normal rate and regular rhythm.     Pulses:          Dorsalis pedis pulses are 1+ on the right side and 1+ on the left side.       Posterior tibial pulses are 1+ on the right side and 1+ on the left side.  Pulmonary:     Effort: Pulmonary effort is normal.     Breath sounds: Normal breath sounds.  Chest:    Abdominal:     Palpations: Abdomen is soft.     Tenderness: There  is no abdominal tenderness. There is no right CVA tenderness, left CVA tenderness, guarding or rebound.  Genitourinary:    Comments: Normal appearance of the cervix.  No adnexal tenderness, no cervical motion tenderness and no palpable masses.  Patient with scant, light white vaginal discharge.  Normal appearance to the external genitalia. Musculoskeletal:        General: No tenderness or deformity.     Cervical back: Normal range of motion and neck supple.  Right lower leg: No edema.     Left lower leg: No edema.     Right foot: No deformity.     Left foot: No deformity.       Feet:  Feet:     Right foot:     Protective Sensation: 10 sites tested. 9 sites sensed.     Skin integrity: Dry skin present. No ulcer, blister, skin breakdown, erythema, warmth or callus.     Toenail Condition: Right toenails are normal.     Left foot:     Protective Sensation: 9 sites tested. 9 sites sensed.     Skin integrity: Dry skin present. No ulcer, blister, skin breakdown, erythema, warmth or callus.     Toenail Condition: Left toenails are normal.     Comments: Toenails appear normal but patient wearing toenail polish Lymphadenopathy:     Cervical: No cervical adenopathy.  Skin:    General: Skin is warm and dry.     Findings: Lesion (Keloid postsurgical scar on central chest) present.  Neurological:     General: No focal deficit present.     Mental Status: She is alert and oriented to person, place, and time.     Cranial Nerves: No cranial nerve deficit.  Psychiatric:        Mood and Affect: Mood normal.        Behavior: Behavior normal.     Assessment & Plan:  1. Well female exam with routine gynecological exam; 9.  Screening for cervical cancer Educational material on preventative health provided to the patient at today's visit.  Patient had Pap smear as a screening test for cervical cancer.  She will be scheduled for mammogram as a screening test for breast cancer as she believes that her  mammogram is off schedule due to the COVID-19 pandemic.  She was also provided with information on performing breast self exams. - Cytology - PAP  2. Type 2 diabetes mellitus with complication, without long-term current use of insulin ([place]); diabetic peripheral neuropathy She is currently on Metformin at 1000 mg twice daily and reports that her blood sugars continue to remain well controlled.  She continues to have peripheral neuropathy but this has improved with the use of gabapentin.  Glucose at today's visit is elevated at 175 and she admits that she did have high carbohydrate meal last night.  Hemoglobin A1c continues to show good control of diabetes as her A1c is 6.5.  Continue low-carb diet and regular exercise.  She will have basic metabolic panel, lipid panel and T4/TSH in follow-up of diabetes. -POCT glucose -POCT glycosylated hemoglobin (hemoglobin U8K) - Basic Metabolic Panel - Lipid panel - T4 AND TSH  3. Dyslipidemia She will have repeat lipid panel at today's visit as well as hepatic panel in follow-up of her use of medication, fenofibrate for the treatment of hypertriglyceridemia.  If triglycerides are improved and since patient is diabetic, she may be changed back to statin therapy but she will be contacted regarding the results and recommendations.  Continue low-fat diet and regular activity.  She is currently on fenofibrate and omega-3 fatty acids taking at 1000 mg 3 times daily. - Hepatic Function Panel - Lipid panel  4. Iron deficiency anemia, unspecified iron deficiency anemia type 5. Long term current use of anticoagulant She has a history of iron deficiency anemia as well as long-term use of anticoagulant medication due to paroxysmal atrial fibrillation.  Most recent CBC on 10/30/2019 showed no anemia and hemoglobin of 12.7.  Order was not placed in time for repeat CBC at today's visit but this will be done at future follow-up.  She denies any unusual bruising or bleeding at  this time and colonoscopy is up-to-date..  6. Hair loss Patient with complaint of hair loss.  At today's visit, she does have her hair pulled into a tight updo and traction alopecia was also discussed with the patient.  She will have TSH to look for thyroid disorder as a contributing factor to her hair loss.  Order for CBC was not placed in time and will be done at a future visit to see if anemia may be contributing to hair loss.  She was encouraged to take over-the-counter supplement, biotin, to see if this helps with hair growth. - T4 AND TSH  7. Screening for STD (sexually transmitted disease); 8. Screening for HIV (human immunodeficiency syndrome) She requested screening for sexually transmitted disease at today's visit.  Cervicovaginal ancillary testing was done during pelvic examination.  Patient did have a scant amount of vaginal discharge and she will be notified of the results of her testing and if any further tests are needed based on the lab results.  She will also have testing for HIV and syphilis at today's visit. - Cervicovaginal ancillary only - HIV antibody (with reflex) - RPR  10. Encounter for long-term current use of medication She will have hepatic function panel and basic metabolic panel in follow-up of long-term use of medications for the treatment of diabetes, hypertension, paroxysmal atrial fibrillation and dyslipidemia. - Hepatic Function Panel  11. Encounter for screening mammogram for malignant neoplasm of breast Order placed for screening mammogram and information provided to the patient regarding how to correctly perform breast self exams. - MM Digital Diagnostic Bilat; Future  12. Essential hypertension Patient with essential hypertension which is currently at goal and stable.  She will continue current medications including amlodipine, carvedilol and furosemide.   13. PAF (paroxysmal atrial fibrillation) ([place]) At today's visit, she is in normal sinus rhythm.   Continue use of Pradaxa and cardiology follow-up.  Outpatient Encounter Medications as of 04/13/2020  Medication Sig  . ACCU-CHEK FASTCLIX LANCETS MISC USE AS DIRECTED  . alum & mag hydroxide-simeth (MAALOX/MYLANTA) 200-200-20 MG/5ML suspension Take 15 mLs by mouth every 6 (six) hours as needed for indigestion or heartburn.  Marland Kitchen amitriptyline (ELAVIL) 50 MG tablet Take 1 tablet (50 mg total) by mouth at bedtime.  Marland Kitchen amLODipine (NORVASC) 10 MG tablet TAKE 1 TABLET BY MOUTH EVERY DAY  . Blood Glucose Calibration (ACCU-CHEK AVIVA) SOLN USE AS INSTRUCTED  . Blood Glucose Monitoring Suppl (ACCU-CHEK AVIVA PLUS) w/Device KIT USE AS DIRECTED TWICE DAILY AT 8 AM AND 10 PM  . Calcium Carb-Cholecalciferol (CALCIUM-VITAMIN D3) 500-400 MG-UNIT TABS Take 1 tablet by mouth daily.  . carvedilol (COREG) 3.125 MG tablet Take 1 tablet (3.125 mg total) by mouth 2 (two) times daily.  . cetirizine (ZYRTEC) 10 MG tablet Take 1 tablet (10 mg total) by mouth daily. As needed for nasal congestion  . dabigatran (PRADAXA) 150 MG CAPS capsule Take 1 capsule (150 mg total) by mouth 2 (two) times daily.  . fenofibrate (TRICOR) 145 MG tablet Take 1 tablet (145 mg total) by mouth daily.  . ferrous sulfate 325 (65 FE) MG tablet One pill daily after a meal  . furosemide (LASIX) 20 MG tablet TAKE 1 TABLET BY MOUTH EVERY DAY  . gabapentin (NEURONTIN) 300 MG capsule One pill twice daily and 2 pills at bedtime  .  glucose blood (ACCU-CHEK AVIVA PLUS) test strip USE AS INSTRUCTED TO CHECK BLOOD SUGAR UP TO 3 TIMES DAILY  . hydrOXYzine (ATARAX/VISTARIL) 25 MG tablet TAKE 1 TABLET BY MOUTH 3 TIMES DAILY AS NEEDED FOR ANXIETY.  . metFORMIN (GLUCOPHAGE) 1000 MG tablet TAKE 1 TABLET BY MOUTH 2 TIMES DAILY WITH A MEAL.  . Multiple Vitamin (MULTIVITAMIN WITH MINERALS) TABS tablet Take 1 tablet by mouth daily.  . Omega-3 Fatty Acids (FISH OIL) 1000 MG CPDR Take 3 g by mouth daily.  Marland Kitchen omeprazole (PRILOSEC) 40 MG capsule Take 1 capsule (40 mg  total) by mouth daily.  . polyethylene glycol powder (GAVILAX) 17 GM/SCOOP powder MIX 1 CAPFUL WITH LIQUID AND DRINK ONCE DAILY  . [DISCONTINUED] potassium chloride (K-DUR,KLOR-CON) 10 MEQ tablet Take 1 tablet (10 mEq total) by mouth daily.   No facility-administered encounter medications on file as of 04/13/2020.    An After Visit Summary was printed and given to the patient.   Follow-up: Return in about 4 months (around 08/14/2020) for DM/chronic issues in 4-5 months; sooner if needed.   More than 45 minutes of face-to-face time was spent with the patient at today's visit for completion of annual physical examination as well as assessment and treatment of chronic medical issues.  An additional 15 minutes was required for pre and post visit review of chart/labs/imaging and completion of today's visit note.   Antony Blackbird MD

## 2020-04-14 ENCOUNTER — Other Ambulatory Visit: Payer: Self-pay | Admitting: Family Medicine

## 2020-04-14 DIAGNOSIS — N760 Acute vaginitis: Secondary | ICD-10-CM

## 2020-04-14 DIAGNOSIS — B9689 Other specified bacterial agents as the cause of diseases classified elsewhere: Secondary | ICD-10-CM

## 2020-04-14 LAB — BASIC METABOLIC PANEL WITH GFR
BUN/Creatinine Ratio: 12 (ref 9–23)
BUN: 10 mg/dL (ref 6–24)
CO2: 22 mmol/L (ref 20–29)
Calcium: 10.7 mg/dL — ABNORMAL HIGH (ref 8.7–10.2)
Chloride: 103 mmol/L (ref 96–106)
Creatinine, Ser: 0.84 mg/dL (ref 0.57–1.00)
GFR calc Af Amer: 88 mL/min/1.73
GFR calc non Af Amer: 76 mL/min/1.73
Glucose: 151 mg/dL — ABNORMAL HIGH (ref 65–99)
Potassium: 4 mmol/L (ref 3.5–5.2)
Sodium: 141 mmol/L (ref 134–144)

## 2020-04-14 LAB — CERVICOVAGINAL ANCILLARY ONLY
Bacterial Vaginitis (gardnerella): POSITIVE — AB
Candida Glabrata: NEGATIVE
Candida Vaginitis: NEGATIVE
Chlamydia: NEGATIVE
Comment: NEGATIVE
Comment: NEGATIVE
Comment: NEGATIVE
Comment: NEGATIVE
Comment: NEGATIVE
Comment: NORMAL
Neisseria Gonorrhea: NEGATIVE
Trichomonas: NEGATIVE

## 2020-04-14 LAB — LIPID PANEL
Chol/HDL Ratio: 2.6 ratio (ref 0.0–4.4)
Cholesterol, Total: 140 mg/dL (ref 100–199)
HDL: 54 mg/dL
LDL Chol Calc (NIH): 59 mg/dL (ref 0–99)
Triglycerides: 163 mg/dL — ABNORMAL HIGH (ref 0–149)
VLDL Cholesterol Cal: 27 mg/dL (ref 5–40)

## 2020-04-14 LAB — T4 AND TSH
T4, Total: 6.8 ug/dL (ref 4.5–12.0)
TSH: 1.3 u[IU]/mL (ref 0.450–4.500)

## 2020-04-14 LAB — HEPATIC FUNCTION PANEL
ALT: 11 IU/L (ref 0–32)
AST: 15 IU/L (ref 0–40)
Albumin: 4.6 g/dL (ref 3.8–4.9)
Alkaline Phosphatase: 44 IU/L — ABNORMAL LOW (ref 48–121)
Bilirubin Total: 0.3 mg/dL (ref 0.0–1.2)
Bilirubin, Direct: 0.1 mg/dL (ref 0.00–0.40)
Total Protein: 7.7 g/dL (ref 6.0–8.5)

## 2020-04-14 LAB — SYPHILIS: RPR W/REFLEX TO RPR TITER AND TREPONEMAL ANTIBODIES, TRADITIONAL SCREENING AND DIAGNOSIS ALGORITHM: RPR Ser Ql: NONREACTIVE

## 2020-04-14 LAB — HIV ANTIBODY (ROUTINE TESTING W REFLEX): HIV Screen 4th Generation wRfx: NONREACTIVE

## 2020-04-14 MED ORDER — METRONIDAZOLE 500 MG PO TABS: 500.0000 mg | ORAL_TABLET | Freq: Two times a day (BID) | ORAL | 0 refills | Status: AC

## 2020-04-15 ENCOUNTER — Other Ambulatory Visit: Payer: Self-pay | Admitting: Family Medicine

## 2020-04-15 DIAGNOSIS — Z1231 Encounter for screening mammogram for malignant neoplasm of breast: Secondary | ICD-10-CM

## 2020-04-15 LAB — CYTOLOGY - PAP
Comment: NEGATIVE
Diagnosis: NEGATIVE
High risk HPV: NEGATIVE

## 2020-04-16 ENCOUNTER — Ambulatory Visit (HOSPITAL_COMMUNITY)
Admission: RE | Admit: 2020-04-16 | Discharge: 2020-04-16 | Disposition: A | Discharge status: Home / Self Care | Payer: Medicaid Other | Source: Ambulatory Visit | Attending: Nurse Practitioner | Admitting: Nurse Practitioner

## 2020-04-16 ENCOUNTER — Other Ambulatory Visit: Payer: Self-pay

## 2020-04-16 ENCOUNTER — Encounter (HOSPITAL_COMMUNITY): Payer: Self-pay | Admitting: Nurse Practitioner

## 2020-04-16 VITALS — BP 114/68 | HR 60 | Ht 67.0 in | Wt 179.2 lb

## 2020-04-16 DIAGNOSIS — Z7984 Long term (current) use of oral hypoglycemic drugs: Secondary | ICD-10-CM | POA: Diagnosis not present

## 2020-04-16 DIAGNOSIS — K219 Gastro-esophageal reflux disease without esophagitis: Secondary | ICD-10-CM | POA: Insufficient documentation

## 2020-04-16 DIAGNOSIS — Z888 Allergy status to other drugs, medicaments and biological substances status: Secondary | ICD-10-CM | POA: Insufficient documentation

## 2020-04-16 DIAGNOSIS — I480 Paroxysmal atrial fibrillation: Secondary | ICD-10-CM | POA: Insufficient documentation

## 2020-04-16 DIAGNOSIS — E119 Type 2 diabetes mellitus without complications: Secondary | ICD-10-CM | POA: Insufficient documentation

## 2020-04-16 DIAGNOSIS — Z8249 Family history of ischemic heart disease and other diseases of the circulatory system: Secondary | ICD-10-CM | POA: Insufficient documentation

## 2020-04-16 DIAGNOSIS — I10 Essential (primary) hypertension: Secondary | ICD-10-CM | POA: Insufficient documentation

## 2020-04-16 DIAGNOSIS — Z7901 Long term (current) use of anticoagulants: Secondary | ICD-10-CM | POA: Insufficient documentation

## 2020-04-16 DIAGNOSIS — D6869 Other thrombophilia: Secondary | ICD-10-CM

## 2020-04-16 DIAGNOSIS — I352 Nonrheumatic aortic (valve) stenosis with insufficiency: Secondary | ICD-10-CM | POA: Insufficient documentation

## 2020-04-16 DIAGNOSIS — Z833 Family history of diabetes mellitus: Secondary | ICD-10-CM | POA: Insufficient documentation

## 2020-04-16 DIAGNOSIS — Z79899 Other long term (current) drug therapy: Secondary | ICD-10-CM | POA: Diagnosis not present

## 2020-04-16 NOTE — Progress Notes (Signed)
Patient ID: Jane Arellano, female   DOB: [birth year], 59 y.o.   MRN: [id number]     Primary Care Physician: Jane Blackbird, MD Referring Physician: Dr. Drue Second Jane Arellano is a 59 y.o. female with a h/o PAF in the afib clinic for evaluation. Dx'ed 05/05/15, asymtpomatic, picked up on physical exam. Apparently very little afib burden since then.  She reports that she feels well other than dealing with peripheral neuropathy from her DM. She has not noticed any reoccurring afib. Continues on DOAC with a   CHA2DS2-VASc Score of at least 4.   Lifestyle habits reviewed and pt reports very little caffeine, no alcohol, walks daily. Denies snoring. Discussed avoiding decongestants going into cold/flu season.  F/u in afib clinic, 04/16/20. She states that she feels great. No issues with afib. Recently, saw PCP for physical with labs.   Today, she denies symptoms of palpitations, chest pain, shortness of breath, orthopnea, PND, lower extremity edema, dizziness, presyncope, syncope, or neurologic sequela. The patient is tolerating medications without difficulties and is otherwise without complaint today.   Past Medical History:  Diagnosis Date  . Atypical chest pain    a. 11/2005 Negative Myoview  . Diastolic dysfunction   . DM2 (diabetes mellitus, type 2) ([place])   . Dysrhythmia    AFIB HX CV   . GERD (gastroesophageal reflux disease)   . Heart murmur   . History of substance abuse ([place])   . HTN (hypertension)   . Iron deficiency anemia   . Paroxysmal atrial fibrillation ([place]) 05/01/2013   On Xarelto  . Subaortic membrane    a. 01/2010 Echo: EF 55-60%, No rwma, subaortic membrane with elevated LVOT mean gradient of 21 mmHg, Triv AI, mod dil LA, mildly increased PASP. b. 05/02/2013 Echo:  LVEF 48-54%, grade 1 diastolic dysfunction, mild LVH, subaortic stenosis w/ turbulation in LVOT c/w subaortic membrane (mean gradient 42 mmHg/peak gradient 81 mmHg), mild biatrial enlargement   Past Surgical  History:  Procedure Laterality Date  . CARDIOVERSION N/A 11/04/2013   Procedure: CARDIOVERSION;  Surgeon: Jane M Martinique, MD;  Location: [facility] ENDOSCOPY;  Service: Cardiovascular;  Laterality: N/A;  . CESAREAN SECTION      Current Outpatient Medications  Medication Sig Dispense Refill  . ACCU-CHEK FASTCLIX LANCETS MISC USE AS DIRECTED 100 each 0  . alum & mag hydroxide-simeth (MAALOX/MYLANTA) 200-200-20 MG/5ML suspension Take 15 mLs by mouth every 6 (six) hours as needed for indigestion or heartburn. 355 mL 5  . amitriptyline (ELAVIL) 50 MG tablet Take 1 tablet (50 mg total) by mouth at bedtime. 30 tablet 5  . amLODipine (NORVASC) 10 MG tablet TAKE 1 TABLET BY MOUTH EVERY DAY 30 tablet 2  . Blood Glucose Calibration (ACCU-CHEK AVIVA) SOLN USE AS INSTRUCTED 1 each 0  . Blood Glucose Monitoring Suppl (ACCU-CHEK AVIVA PLUS) w/Device KIT USE AS DIRECTED TWICE DAILY AT 8 AM AND 10 PM 1 kit 0  . Calcium Carb-Cholecalciferol (CALCIUM-VITAMIN D3) 500-400 MG-UNIT TABS Take 1 tablet by mouth daily. 60 tablet 5  . carvedilol (COREG) 3.125 MG tablet Take 1 tablet (3.125 mg total) by mouth 2 (two) times daily. 180 tablet 3  . cetirizine (ZYRTEC) 10 MG tablet Take 1 tablet (10 mg total) by mouth daily. As needed for nasal congestion 30 tablet 3  . dabigatran (PRADAXA) 150 MG CAPS capsule Take 1 capsule (150 mg total) by mouth 2 (two) times daily. 60 capsule 2  . fenofibrate (TRICOR) 145 MG tablet Take 1 tablet (145  mg total) by mouth daily. 90 tablet 1  . ferrous sulfate 325 (65 FE) MG tablet One pill daily after a meal 90 tablet 3  . furosemide (LASIX) 20 MG tablet TAKE 1 TABLET BY MOUTH EVERY DAY 30 tablet 2  . gabapentin (NEURONTIN) 300 MG capsule One pill twice daily and 2 pills at bedtime 360 capsule 1  . glucose blood (ACCU-CHEK AVIVA PLUS) test strip USE AS INSTRUCTED TO CHECK BLOOD SUGAR UP TO 3 TIMES DAILY 100 each prn  . hydrOXYzine (ATARAX/VISTARIL) 25 MG tablet TAKE 1 TABLET BY MOUTH 3 TIMES DAILY  AS NEEDED FOR ANXIETY. 60 tablet 5  . metFORMIN (GLUCOPHAGE) 1000 MG tablet TAKE 1 TABLET BY MOUTH 2 TIMES DAILY WITH A MEAL. 180 tablet 3  . metroNIDAZOLE (FLAGYL) 500 MG tablet Take 1 tablet (500 mg total) by mouth 2 (two) times daily for 7 days. 14 tablet 0  . Multiple Vitamin (MULTIVITAMIN WITH MINERALS) TABS tablet Take 1 tablet by mouth daily.    . Omega-3 Fatty Acids (FISH OIL) 1000 MG CPDR Take 3 g by mouth daily. (Patient taking differently: Take 2 capsules by mouth daily. ) 90 capsule 6  . omeprazole (PRILOSEC) 40 MG capsule Take 1 capsule (40 mg total) by mouth daily. 90 capsule 3  . polyethylene glycol powder (GAVILAX) 17 GM/SCOOP powder MIX 1 CAPFUL WITH LIQUID AND DRINK ONCE DAILY 476 g 6   No current facility-administered medications for this encounter.    Allergies  Allergen Reactions  . Xarelto [Rivaroxaban] Swelling    Gums bled, headaches  . Ketorolac Tromethamine Other (See Comments)    Unknown reaction    Social History   Socioeconomic History  . Marital status: Single    Spouse name: Not on file  . Number of children: Not on file  . Years of education: Not on file  . Highest education level: Not on file  Occupational History  . Not on file  Tobacco Use  . Smoking status: Never Smoker  . Smokeless tobacco: Current User    Types: Snuff  Substance and Sexual Activity  . Alcohol use: Yes    Alcohol/week: 1.0 - 2.0 standard drinks    Types: 1 - 2 Glasses of wine per week  . Drug use: No    Types: Marijuana    Comment: Smokes marijuana weekly.  Has not used cocaine in 9 years.  . Sexual activity: Yes    Birth control/protection: None  Other Topics Concern  . Not on file  Social History Narrative   Lives in [place] by herself.  She had been caring for her mother but she died 2 mos ago.  She tries to remain active but does not regularly exercise.   Social Determinants of Health   Financial Resource Strain:   . Difficulty of Paying Living Expenses:   Food  Insecurity:   . Worried About Charity fundraiser in the Last Year:   . Arboriculturist in the Last Year:   Transportation Needs:   . Film/video editor (Medical):   Jane Kitchen Lack of Transportation (Non-Medical):   Physical Activity:   . Days of Exercise per Week:   . Minutes of Exercise per Session:   Stress:   . Feeling of Stress :   Social Connections:   . Frequency of Communication with Friends and Family:   . Frequency of Social Gatherings with Friends and Family:   . Attends Religious Services:   . Active Member of Clubs  or Organizations:   . Attends Archivist Meetings:   Jane Kitchen Marital Status:   Intimate Partner Violence:   . Fear of Current or Ex-Partner:   . Emotionally Abused:   Jane Kitchen Physically Abused:   . Sexually Abused:     Family History  Problem Relation Age of Onset  . Diabetes Father        deceased  . Cancer Mother 69       colon  . Stroke Mother   . Hypertension Sister        alive and well  . Hypertension Sister        alive and well  . Heart attack Brother        deceased @ 14    ROS- All systems are reviewed and negative except as per the HPI above  Physical Exam: Vitals:   04/16/20 1003  BP: 114/68  Pulse: 60  Weight: 81.3 kg  Height: '5\' 7"'$  (1.702 m)    GEN- The patient is well appearing, alert and oriented x 3 today.   Head- normocephalic, atraumatic Eyes-  Sclera clear, conjunctiva pink Ears- hearing intact Oropharynx- clear Neck- supple, no JVP Lymph- no cervical lymphadenopathy Lungs- Clear to ausculation bilaterally, normal work of breathing Heart- Regular rate and rhythm, 2/6 systolic murmiur murmurs, rubs or gallops, PMI not laterally displaced GI- soft, NT, ND, + BS Extremities- no clubbing, cyanosis, or edema MS- no significant deformity or atrophy Skin- no rash or lesion Psych- euthymic mood, full affect Neuro- strength and sensation are intact  EKG-SR at 73 bpm, qrs int 94 ms, qtc  399  ms  Epic records  reviewed  Echo- 1. Left ventricular ejection fraction, by visual estimation, is 60 to 65%. The left ventricle has normal function. There is mildly increased left ventricular hypertrophy. 2. Global right ventricle has normal systolic function.The right ventricular size is normal. No increase in right ventricular wall thickness. 3. Left atrial size was normal. 4. Right atrial size was normal. 5. The mitral valve is normal in structure. No evidence of mitral valve regurgitation. 6. The tricuspid valve is normal in structure. Tricuspid valve regurgitation is mild. 7. The aortic valve is tricuspid. Aortic valve regurgitation is mild to moderate. Mild aortic valve stenosis. 8. The pulmonic valve was not well visualized. Pulmonic valve regurgitation is not visualized. 9. The inferior vena cava is normal in size with greater than 50% respiratory variability, suggesting right atrial pressure of 3 mmHg. 10. The tricuspid regurgitant velocity is 2.36 m/s, and with an assumed right atrial pressure of 3 mmHg, the estimated right ventricular systolic pressure is normal at 25.3 mmHg.  Assessment and Plan:  1. PAF No know burden  Continues in SR Continue carvedilol Continue pradaxa for chadsvasc of at least 4, no bleeding issues   2. HTN Stable   3. Mild aortic stenosis with mild to mod regurgitation Will repeat cho in the fall     F/u in afib clinic in 6 months  Butch Penny C. Shaena Parkerson, [facility] [address] [place], [place] [address] [phone]

## 2020-04-30 ENCOUNTER — Telehealth: Payer: Self-pay

## 2020-04-30 NOTE — Telephone Encounter (Signed)
[facility]. Need auth for UNI LT & UNI RT. Please obtain prior auth for [facility] CPT.

## 2020-05-01 ENCOUNTER — Other Ambulatory Visit: Payer: Self-pay | Admitting: Family Medicine

## 2020-05-01 DIAGNOSIS — N6459 Other signs and symptoms in breast: Secondary | ICD-10-CM

## 2020-05-01 NOTE — Telephone Encounter (Signed)
Called [facility]. Representative stated she has PA # already.

## 2020-05-04 ENCOUNTER — Other Ambulatory Visit: Payer: Self-pay | Admitting: Family Medicine

## 2020-05-05 ENCOUNTER — Ambulatory Visit: Payer: Medicaid Other

## 2020-05-05 ENCOUNTER — Other Ambulatory Visit: Payer: Self-pay

## 2020-05-05 ENCOUNTER — Ambulatory Visit
Admission: RE | Admit: 2020-05-05 | Discharge: 2020-05-05 | Disposition: A | Discharge status: Home / Self Care | Payer: Medicaid Other | Source: Ambulatory Visit | Attending: Family Medicine | Admitting: Family Medicine

## 2020-05-05 DIAGNOSIS — N6459 Other signs and symptoms in breast: Secondary | ICD-10-CM

## 2020-05-10 ENCOUNTER — Other Ambulatory Visit: Payer: Self-pay | Admitting: Family Medicine

## 2020-05-10 DIAGNOSIS — E118 Type 2 diabetes mellitus with unspecified complications: Secondary | ICD-10-CM

## 2020-05-10 DIAGNOSIS — E1142 Type 2 diabetes mellitus with diabetic polyneuropathy: Secondary | ICD-10-CM

## 2020-05-10 DIAGNOSIS — I10 Essential (primary) hypertension: Secondary | ICD-10-CM

## 2020-06-02 ENCOUNTER — Other Ambulatory Visit: Payer: Self-pay | Admitting: Family Medicine

## 2020-06-02 DIAGNOSIS — I10 Essential (primary) hypertension: Secondary | ICD-10-CM

## 2020-06-02 DIAGNOSIS — E785 Hyperlipidemia, unspecified: Secondary | ICD-10-CM

## 2020-06-25 ENCOUNTER — Other Ambulatory Visit: Payer: Self-pay | Admitting: Family Medicine

## 2020-06-25 DIAGNOSIS — I10 Essential (primary) hypertension: Secondary | ICD-10-CM

## 2020-06-28 ENCOUNTER — Other Ambulatory Visit: Payer: Self-pay | Admitting: Family Medicine

## 2020-06-28 DIAGNOSIS — I10 Essential (primary) hypertension: Secondary | ICD-10-CM

## 2020-06-28 DIAGNOSIS — J309 Allergic rhinitis, unspecified: Secondary | ICD-10-CM

## 2020-06-29 ENCOUNTER — Other Ambulatory Visit: Payer: Self-pay | Admitting: Family Medicine

## 2020-06-29 DIAGNOSIS — I10 Essential (primary) hypertension: Secondary | ICD-10-CM

## 2020-07-22 ENCOUNTER — Other Ambulatory Visit: Payer: Self-pay | Admitting: Family Medicine

## 2020-07-22 DIAGNOSIS — I10 Essential (primary) hypertension: Secondary | ICD-10-CM

## 2020-07-24 ENCOUNTER — Other Ambulatory Visit: Payer: Self-pay | Admitting: Family Medicine

## 2020-07-24 DIAGNOSIS — E1142 Type 2 diabetes mellitus with diabetic polyneuropathy: Secondary | ICD-10-CM

## 2020-07-24 NOTE — Telephone Encounter (Signed)
   Notes to clinic:  REQUEST FOR 90 DAYS PRESCRIPTION. DX Code Needed.   Requested Prescriptions  Pending Prescriptions Disp Refills   amitriptyline (ELAVIL) 50 MG tablet [Pharmacy Med Name: AMITRIPTYLINE HCL 50 MG TAB] 90 tablet 2    Sig: TAKE 1 TABLET BY MOUTH EVERYDAY AT BEDTIME      Psychiatry:  Antidepressants - Heterocyclics (TCAs) Failed - 07/24/2020  1:58 PM      Failed - Completed PHQ-2 or PHQ-9 in the last 360 days.      Passed - Valid encounter within last 6 months    Recent Outpatient Visits           3 months ago Well female exam with routine gynecological exam   [facility] Fulp, [place], MD   8 months ago Diabetic polyneuropathy associated with type 2 diabetes mellitus (HCC)   [facility] Fulp, [place], MD   1 year ago Type 2 diabetes mellitus with complication, without long-term current use of insulin (HCC)   [facility] Fulp, [place], MD   1 year ago Gastroesophageal reflux disease without esophagitis   [facility] Fulp, [place], MD   1 year ago Left otitis media, unspecified otitis media type   [facility] [place], [place], MD       Future Appointments             In 2 weeks Cain Saupe, MD [facility]

## 2020-07-27 ENCOUNTER — Other Ambulatory Visit: Payer: Self-pay | Admitting: Family Medicine

## 2020-07-27 DIAGNOSIS — E1142 Type 2 diabetes mellitus with diabetic polyneuropathy: Secondary | ICD-10-CM

## 2020-08-07 ENCOUNTER — Ambulatory Visit: Payer: Medicaid Other | Admitting: Family

## 2020-08-20 ENCOUNTER — Ambulatory Visit: Payer: Medicaid Other | Admitting: Family Medicine

## 2020-08-28 ENCOUNTER — Other Ambulatory Visit: Payer: Self-pay | Admitting: Family Medicine

## 2020-08-28 DIAGNOSIS — I10 Essential (primary) hypertension: Secondary | ICD-10-CM

## 2020-08-28 NOTE — Telephone Encounter (Signed)
Requested Prescriptions  Pending Prescriptions Disp Refills  . carvedilol (COREG) 3.125 MG tablet [Pharmacy Med Name: CARVEDILOL 3.125 MG TABLET] 180 tablet 0    Sig: TAKE 1 TABLET (3.125 MG TOTAL) BY MOUTH 2 (TWO) TIMES DAILY.     Cardiovascular:  Beta Blockers Passed - 08/28/2020  3:48 PM      Passed - Last BP in normal range    BP Readings from Last 1 Encounters:  04/16/20 114/68         Passed - Last Heart Rate in normal range    Pulse Readings from Last 1 Encounters:  04/16/20 60         Passed - Valid encounter within last 6 months    Recent Outpatient Visits          4 months ago Well female exam with routine gynecological exam   [facility] Fulp, [place], MD   10 months ago Diabetic polyneuropathy associated with type 2 diabetes mellitus (HCC)   [facility] Fulp, [place], MD   1 year ago Type 2 diabetes mellitus with complication, without long-term current use of insulin (HCC)   [facility] Fulp, [place], MD   1 year ago Gastroesophageal reflux disease without esophagitis   [facility] Fulp, [place], MD   1 year ago Left otitis media, unspecified otitis media type   [facility] [place], [place], MD

## 2020-09-15 ENCOUNTER — Other Ambulatory Visit: Payer: Self-pay | Admitting: Family Medicine

## 2020-09-15 DIAGNOSIS — Z1231 Encounter for screening mammogram for malignant neoplasm of breast: Secondary | ICD-10-CM

## 2020-09-19 ENCOUNTER — Other Ambulatory Visit: Payer: Self-pay | Admitting: Family Medicine

## 2020-09-19 DIAGNOSIS — J309 Allergic rhinitis, unspecified: Secondary | ICD-10-CM

## 2020-09-19 NOTE — Telephone Encounter (Signed)
Requested Prescriptions  Pending Prescriptions Disp Refills  . cetirizine (ZYRTEC) 10 MG tablet [Pharmacy Med Name: CETIRIZINE HCL 10 MG TABLET] 90 tablet 0    Sig: TAKE 1 TABLET (10 MG TOTAL) BY MOUTH DAILY. AS NEEDED FOR NASAL CONGESTION     Ear, Nose, and Throat:  Antihistamines Passed - 09/19/2020 10:30 AM      Passed - Valid encounter within last 12 months    Recent Outpatient Visits          5 months ago Well female exam with routine gynecological exam   [facility] Fulp, [place], MD   10 months ago Diabetic polyneuropathy associated with type 2 diabetes mellitus (HCC)   [facility] Fulp, [place], MD   1 year ago Type 2 diabetes mellitus with complication, without long-term current use of insulin (HCC)   [facility] Fulp, [place], MD   1 year ago Gastroesophageal reflux disease without esophagitis   [facility] Fulp, [place], MD   1 year ago Left otitis media, unspecified otitis media type   [facility] [place], [place], MD

## 2020-10-03 ENCOUNTER — Other Ambulatory Visit: Payer: Self-pay | Admitting: Family Medicine

## 2020-10-03 DIAGNOSIS — I10 Essential (primary) hypertension: Secondary | ICD-10-CM

## 2020-10-03 NOTE — Telephone Encounter (Signed)
Requested Prescriptions  Pending Prescriptions Disp Refills  . amLODipine (NORVASC) 10 MG tablet [Pharmacy Med Name: AMLODIPINE BESYLATE 10 MG TAB] 90 tablet 0    Sig: TAKE 1 TABLET BY MOUTH EVERY DAY     Cardiovascular:  Calcium Channel Blockers Passed - 10/03/2020  5:33 PM      Passed - Last BP in normal range    BP Readings from Last 1 Encounters:  04/16/20 114/68         Passed - Valid encounter within last 6 months    Recent Outpatient Visits          5 months ago Well female exam with routine gynecological exam   [facility] Fulp, [place], MD   11 months ago Diabetic polyneuropathy associated with type 2 diabetes mellitus (HCC)   [facility] Fulp, [place], MD   1 year ago Type 2 diabetes mellitus with complication, without long-term current use of insulin (HCC)   [facility] Fulp, [place], MD   1 year ago Gastroesophageal reflux disease without esophagitis   [facility] Fulp, [place], MD   1 year ago Left otitis media, unspecified otitis media type   [facility] [place], [place], MD

## 2020-10-07 ENCOUNTER — Other Ambulatory Visit: Payer: Self-pay | Admitting: Family Medicine

## 2020-10-07 DIAGNOSIS — I10 Essential (primary) hypertension: Secondary | ICD-10-CM

## 2020-10-07 NOTE — Telephone Encounter (Signed)
Requested medications are due for refill today yes  Requested medications are on the active medication list yes  Last refill 10/7  Last visit 03/2020  Future visit scheduled no  Notes to clinic Was supposed to come back in Sept, no upcoming appt scheduled, please assess.

## 2020-10-14 ENCOUNTER — Ambulatory Visit (HOSPITAL_COMMUNITY): Payer: Medicaid Other | Admitting: Nurse Practitioner

## 2020-10-21 ENCOUNTER — Ambulatory Visit: Payer: Medicaid Other

## 2020-10-28 ENCOUNTER — Ambulatory Visit (HOSPITAL_COMMUNITY): Payer: Medicaid Other | Admitting: Nurse Practitioner

## 2020-10-28 ENCOUNTER — Other Ambulatory Visit: Payer: Self-pay | Admitting: Family Medicine

## 2020-10-28 ENCOUNTER — Telehealth: Payer: Self-pay

## 2020-10-28 DIAGNOSIS — I10 Essential (primary) hypertension: Secondary | ICD-10-CM

## 2020-10-28 NOTE — Telephone Encounter (Signed)
Requested medication (s) are due for refill today: no  Requested medication (s) are on the active medication list: yes   Last refill:  09/23/2020  Future visit scheduled: no  Notes to clinic: Message left for patient to callback for appt  Overdue for appt    Requested Prescriptions  Pending Prescriptions Disp Refills   furosemide (LASIX) 20 MG tablet [Pharmacy Med Name: FUROSEMIDE 20 MG TABLET] 30 tablet 2    Sig: TAKE 1 TABLET BY MOUTH EVERY DAY      Cardiovascular:  Diuretics - Loop Failed - 10/28/2020 11:41 AM      Failed - Ca in normal range and within 360 days    Calcium  Date Value Ref Range Status  04/13/2020 10.7 (H) 8.7 - 10.2 mg/dL Final   Calcium, Ion  Date Value Ref Range Status  03/18/2014 1.22 1.12 - 1.23 mmol/L Final          Failed - Valid encounter within last 6 months    Recent Outpatient Visits           6 months ago Well female exam with routine gynecological exam   [facility] Fulp, [place], MD   12 months ago Diabetic polyneuropathy associated with type 2 diabetes mellitus (HCC)   [facility] Fulp, [place], MD   1 year ago Type 2 diabetes mellitus with complication, without long-term current use of insulin (HCC)   [facility] Fulp, [place], MD   1 year ago Gastroesophageal reflux disease without esophagitis   [facility] [place], [place], MD   1 year ago Left otitis media, unspecified otitis media type   [facility] [place], [place], MD              Passed - K in normal range and within 360 days    Potassium  Date Value Ref Range Status  04/13/2020 4.0 3.5 - 5.2 mmol/L Final          Passed - Na in normal range and within 360 days    Sodium  Date Value Ref Range Status  04/13/2020 141 134 - 144 mmol/L Final          Passed - Cr in normal range and within 360 days    Creat  Date Value Ref Range  Status  08/18/2016 0.64 0.50 - 1.05 mg/dL Final    Comment:      For patients > or = 60 years of age: The upper reference limit for Creatinine is approximately 13% higher for people identified as African-American.      Creatinine, Ser  Date Value Ref Range Status  04/13/2020 0.84 0.57 - 1.00 mg/dL Final   Creatinine, POC  Date Value Ref Range Status  04/26/2017 50 mg/dL Final   Creatinine, Urine  Date Value Ref Range Status  12/10/2014 38.8 mg/dL Final    Comment:    No reference range established.          Passed - Last BP in normal range    BP Readings from Last 1 Encounters:  04/16/20 114/68

## 2020-10-28 NOTE — Telephone Encounter (Signed)
Requested medication (s) are due for refill today: yes   Requested medication (s) are on the active medication list: yes   Last refill: 07/22/2020  Future visit scheduled: no  Notes to clinic:  message left for patient to callback and schedule follow up  Overdue for 6 month follow up    Requested Prescriptions  Pending Prescriptions Disp Refills   furosemide (LASIX) 20 MG tablet 30 tablet 2    Sig: Take 1 tablet (20 mg total) by mouth daily.      Cardiovascular:  Diuretics - Loop Failed - 10/28/2020  1:03 PM      Failed - Ca in normal range and within 360 days    Calcium  Date Value Ref Range Status  04/13/2020 10.7 (H) 8.7 - 10.2 mg/dL Final   Calcium, Ion  Date Value Ref Range Status  03/18/2014 1.22 1.12 - 1.23 mmol/L Final          Failed - Valid encounter within last 6 months    Recent Outpatient Visits           6 months ago Well female exam with routine gynecological exam   [facility] Fulp, [place], MD   12 months ago Diabetic polyneuropathy associated with type 2 diabetes mellitus (HCC)   [facility] Fulp, [place], MD   1 year ago Type 2 diabetes mellitus with complication, without long-term current use of insulin (HCC)   [facility] Fulp, [place], MD   1 year ago Gastroesophageal reflux disease without esophagitis   [facility] [place], [place], MD   1 year ago Left otitis media, unspecified otitis media type   [facility] [place], [place], MD              Passed - K in normal range and within 360 days    Potassium  Date Value Ref Range Status  04/13/2020 4.0 3.5 - 5.2 mmol/L Final          Passed - Na in normal range and within 360 days    Sodium  Date Value Ref Range Status  04/13/2020 141 134 - 144 mmol/L Final          Passed - Cr in normal range and within 360 days    Creat  Date Value Ref Range Status   08/18/2016 0.64 0.50 - 1.05 mg/dL Final    Comment:      For patients > or = 60 years of age: The upper reference limit for Creatinine is approximately 13% higher for people identified as African-American.      Creatinine, Ser  Date Value Ref Range Status  04/13/2020 0.84 0.57 - 1.00 mg/dL Final   Creatinine, POC  Date Value Ref Range Status  04/26/2017 50 mg/dL Final   Creatinine, Urine  Date Value Ref Range Status  12/10/2014 38.8 mg/dL Final    Comment:    No reference range established.          Passed - Last BP in normal range    BP Readings from Last 1 Encounters:  04/16/20 114/68

## 2020-10-29 NOTE — Telephone Encounter (Signed)
PT calling to f/up on her refill request

## 2020-10-30 NOTE — Telephone Encounter (Signed)
Pt is requesting refill on Lasix. Pt has appointment with you on 12/22/2019. Please follow up.

## 2020-11-03 ENCOUNTER — Other Ambulatory Visit: Payer: Self-pay | Admitting: Nurse Practitioner

## 2020-11-03 DIAGNOSIS — I10 Essential (primary) hypertension: Secondary | ICD-10-CM

## 2020-11-03 NOTE — Telephone Encounter (Signed)
NEEDS LABWORK

## 2020-11-04 NOTE — Telephone Encounter (Signed)
Spoke to patient and scheduled a lab appt.

## 2020-11-09 ENCOUNTER — Other Ambulatory Visit: Payer: Self-pay

## 2020-11-09 ENCOUNTER — Ambulatory Visit: Payer: Medicaid Other | Attending: Family Medicine

## 2020-11-09 DIAGNOSIS — I10 Essential (primary) hypertension: Secondary | ICD-10-CM | POA: Diagnosis not present

## 2020-11-09 DIAGNOSIS — E118 Type 2 diabetes mellitus with unspecified complications: Secondary | ICD-10-CM | POA: Diagnosis not present

## 2020-11-09 DIAGNOSIS — Z130 Encounter for screening for diseases of the blood and blood-forming organs and certain disorders involving the immune mechanism: Secondary | ICD-10-CM

## 2020-11-10 LAB — MICROALBUMIN / CREATININE URINE RATIO
Creatinine, Urine: 116.6 mg/dL
Microalb/Creat Ratio: 22 mg/g creat (ref 0–29)
Microalbumin, Urine: 25.4 ug/mL

## 2020-11-10 LAB — CBC
Hematocrit: 35.9 % (ref 34.0–46.6)
Hemoglobin: 12.3 g/dL (ref 11.1–15.9)
MCH: 33.1 pg — ABNORMAL HIGH (ref 26.6–33.0)
MCHC: 34.3 g/dL (ref 31.5–35.7)
MCV: 97 fL (ref 79–97)
Platelets: 203 10*3/uL (ref 150–450)
RBC: 3.72 x10E6/uL — ABNORMAL LOW (ref 3.77–5.28)
RDW: 12.6 % (ref 11.7–15.4)
WBC: 4.9 10*3/uL (ref 3.4–10.8)

## 2020-11-10 LAB — BASIC METABOLIC PANEL
BUN/Creatinine Ratio: 10 — ABNORMAL LOW (ref 12–28)
BUN: 8 mg/dL (ref 8–27)
CO2: 23 mmol/L (ref 20–29)
Calcium: 10.6 mg/dL — ABNORMAL HIGH (ref 8.7–10.3)
Chloride: 104 mmol/L (ref 96–106)
Creatinine, Ser: 0.79 mg/dL (ref 0.57–1.00)
GFR calc Af Amer: 94 mL/min/{1.73_m2} (ref >59)
GFR calc non Af Amer: 82 mL/min/{1.73_m2} (ref >59)
Glucose: 92 mg/dL (ref 65–99)
Potassium: 4.5 mmol/L (ref 3.5–5.2)
Sodium: 143 mmol/L (ref 134–144)

## 2020-11-10 LAB — HEMOGLOBIN A1C
Est. average glucose Bld gHb Est-mCnc: 154 mg/dL
Hgb A1c MFr Bld: 7 % — ABNORMAL HIGH (ref 4.8–5.6)

## 2020-11-23 ENCOUNTER — Other Ambulatory Visit: Payer: Self-pay | Admitting: Family Medicine

## 2020-11-23 DIAGNOSIS — I10 Essential (primary) hypertension: Secondary | ICD-10-CM

## 2020-12-01 ENCOUNTER — Telehealth: Payer: Self-pay | Admitting: Nurse Practitioner

## 2020-12-01 DIAGNOSIS — E785 Hyperlipidemia, unspecified: Secondary | ICD-10-CM

## 2020-12-01 DIAGNOSIS — I10 Essential (primary) hypertension: Secondary | ICD-10-CM

## 2020-12-01 MED ORDER — FENOFIBRATE 145 MG PO TABS: 145.0000 mg | ORAL_TABLET | Freq: Every day | ORAL | 0 refills | Status: DC

## 2020-12-01 MED ORDER — FUROSEMIDE 20 MG PO TABS: 20.0000 mg | ORAL_TABLET | Freq: Every day | ORAL | 0 refills | Status: DC

## 2020-12-01 NOTE — Telephone Encounter (Signed)
Pt needs appt. Will send one 30-day supply to last until her upcoming appointment.

## 2020-12-01 NOTE — Telephone Encounter (Signed)
Medication Refill - Medication: furosemide (LASIX) 20 MG tablet  fenofibrate (TRICOR) 145 MG tablet  Pt is completely out of both   Has the patient contacted their pharmacy? Yes.   (Agent: If no, request that the patient contact the pharmacy for the refill.) (Agent: If yes, when and what did the pharmacy advise?)  Preferred Pharmacy (with phone number or street name):  [id number] [place], [place] - [phone]  [address] [place] [address]  Phone: [phone] Fax: [phone]     Agent: Please be advised that RX refills may take up to 3 business days. We ask that you follow-up with your pharmacy.

## 2020-12-02 ENCOUNTER — Ambulatory Visit: Payer: Medicaid Other

## 2020-12-02 ENCOUNTER — Telehealth (INDEPENDENT_AMBULATORY_CARE_PROVIDER_SITE_OTHER): Payer: Self-pay

## 2020-12-02 ENCOUNTER — Ambulatory Visit (HOSPITAL_COMMUNITY): Payer: Medicaid Other | Admitting: Nurse Practitioner

## 2020-12-02 NOTE — Telephone Encounter (Signed)
Copied from CRM [id number]. Topic: General - Call Back - No Documentation >> Dec 01, 2020 11:39 AM Randol Kern wrote: [phone] pt needs a call back from nurses to discuss recent lab results

## 2020-12-04 NOTE — Telephone Encounter (Signed)
Attempt to reach patient. °No answer and LVM.  °

## 2020-12-09 ENCOUNTER — Other Ambulatory Visit: Payer: Self-pay

## 2020-12-09 ENCOUNTER — Ambulatory Visit
Admission: RE | Admit: 2020-12-09 | Discharge: 2020-12-09 | Disposition: A | Discharge status: Home / Self Care | Payer: Medicaid Other | Source: Ambulatory Visit | Attending: Family Medicine | Admitting: Family Medicine

## 2020-12-09 ENCOUNTER — Telehealth: Payer: Self-pay

## 2020-12-09 DIAGNOSIS — Z1231 Encounter for screening mammogram for malignant neoplasm of breast: Secondary | ICD-10-CM

## 2020-12-09 NOTE — Telephone Encounter (Signed)
Contacted pt to go over mm results pt is aware

## 2020-12-19 ENCOUNTER — Other Ambulatory Visit: Payer: Self-pay | Admitting: Family Medicine

## 2020-12-19 DIAGNOSIS — I10 Essential (primary) hypertension: Secondary | ICD-10-CM

## 2020-12-20 NOTE — Telephone Encounter (Signed)
Requested medications are due for refill today.  yes  Requested medications are on the active medications list.  yes  Last refill. 11/23/2020  Future visit scheduled.   12/23/2020  Notes to clinic.

## 2020-12-21 ENCOUNTER — Other Ambulatory Visit: Payer: Self-pay

## 2020-12-21 ENCOUNTER — Ambulatory Visit: Payer: Medicaid Other | Attending: Nurse Practitioner | Admitting: Nurse Practitioner

## 2020-12-21 ENCOUNTER — Encounter: Payer: Self-pay | Admitting: Nurse Practitioner

## 2020-12-21 DIAGNOSIS — E118 Type 2 diabetes mellitus with unspecified complications: Secondary | ICD-10-CM | POA: Diagnosis not present

## 2020-12-21 DIAGNOSIS — E1142 Type 2 diabetes mellitus with diabetic polyneuropathy: Secondary | ICD-10-CM | POA: Diagnosis not present

## 2020-12-21 DIAGNOSIS — K219 Gastro-esophageal reflux disease without esophagitis: Secondary | ICD-10-CM

## 2020-12-21 DIAGNOSIS — F418 Other specified anxiety disorders: Secondary | ICD-10-CM

## 2020-12-21 DIAGNOSIS — I10 Essential (primary) hypertension: Secondary | ICD-10-CM | POA: Diagnosis not present

## 2020-12-21 DIAGNOSIS — Z5181 Encounter for therapeutic drug level monitoring: Secondary | ICD-10-CM | POA: Diagnosis not present

## 2020-12-21 MED ORDER — FERROUS SULFATE 325 (65 FE) MG PO TABS
ORAL_TABLET | ORAL | 3 refills | Status: DC
Start: 2020-12-21 — End: 2021-10-29

## 2020-12-21 MED ORDER — METFORMIN HCL 1000 MG PO TABS
ORAL_TABLET | ORAL | 3 refills | Status: DC
Start: 2020-12-21 — End: 2022-01-14

## 2020-12-21 MED ORDER — DABIGATRAN ETEXILATE MESYLATE 150 MG PO CAPS
ORAL_CAPSULE | ORAL | 1 refills | Status: DC
Start: 2020-12-21 — End: 2021-02-10

## 2020-12-21 MED ORDER — AMLODIPINE BESYLATE 10 MG PO TABS: 10.0000 mg | ORAL_TABLET | Freq: Every day | ORAL | 0 refills | Status: DC

## 2020-12-21 MED ORDER — CARVEDILOL 3.125 MG PO TABS: 3.1250 mg | ORAL_TABLET | Freq: Two times a day (BID) | ORAL | 0 refills | Status: DC

## 2020-12-21 MED ORDER — AMITRIPTYLINE HCL 50 MG PO TABS: 50.0000 mg | ORAL_TABLET | Freq: Every day | ORAL | 0 refills | Status: DC

## 2020-12-21 MED ORDER — GABAPENTIN 300 MG PO CAPS: ORAL_CAPSULE | ORAL | 1 refills | Status: DC

## 2020-12-21 MED ORDER — FUROSEMIDE 20 MG PO TABS
20.0000 mg | ORAL_TABLET | Freq: Every day | ORAL | 2 refills | Status: DC
Start: 2020-12-21 — End: 2020-12-29

## 2020-12-21 MED ORDER — HYDROXYZINE HCL 25 MG PO TABS: ORAL_TABLET | ORAL | 5 refills | Status: DC

## 2020-12-21 NOTE — Progress Notes (Signed)
Virtual Visit via Telephone Note Due to national recommendations of social distancing due to COVID 19, telehealth visit is felt to be most appropriate for this patient at this time.  I discussed the limitations, risks, security and privacy concerns of performing an evaluation and management service by telephone and the availability of in person appointments. I also discussed with the patient that there may be a patient responsible charge related to this service. The patient expressed understanding and agreed to proceed.    I connected with Jane Arellano on 12/21/20  at  10:30 AM EST  EDT by telephone and verified that I am speaking with the correct person using two identifiers.   Consent I discussed the limitations, risks, security and privacy concerns of performing an evaluation and management service by telephone and the availability of in person appointments. I also discussed with the patient that there may be a patient responsible charge related to this service. The patient expressed understanding and agreed to proceed.   Location of Patient: Private Residence   Location of Provider: [facility]    Persons participating in Telemedicine visit: Bertram Denver FNP-BC YY Kalona CMA Windi Maryanna Shape    History of Present Illness: Telemedicine visit for: Follow up She has a history of PAF (on Pradaxa), DM 2, IDA, substance abuse, and diastolic dysfunction  States her daughter was diagnosed with COVID and was admitted to the hospital recently however doing better now and should be discharged soon. She will be caring for her daughter after discharge and reports she is currently experiencing "cold chills and loose stools". Plans to get tested for COVID. She has been vaccinated x2 but has not received the booster. Daughter was not vaccinated.   DM 2 Fasting reading this morning 135.  Endorses adherence taking Metformin 1000 mg twice daily.  She is on statin but  no ACE or ARB.  She monitors her blood glucose levels twice a day. Sates she has made lots of changes with her diet.  LDL at goal with TriCor 145 mg daily.  Hyperglycemic symptoms include polyneuropathy for which she takes gabapentin and amitriptyline which is also used for depression and her anxiety. Lab Results  Component Value Date   HGBA1C 7.0 (H) 11/09/2020   Lab Results  Component Value Date   LDLCALC 59 04/13/2020   Essential Hypertension Blood pressure is well controlled with carvedilol 3.125 mg twice daily, amlodipine 10 mg daily, Lasix 20 mg daily. Denies chest pain, shortness of breath, palpitations, lightheadedness, dizziness, headaches or BLE edema.  BP Readings from Last 3 Encounters:  04/16/20 114/68  04/13/20 111/69  10/30/19 133/83   Past Medical History:  Diagnosis Date  . Atypical chest pain    a. 11/2005 Negative Myoview  . Diastolic dysfunction   . DM2 (diabetes mellitus, type 2) (HCC)   . Dysrhythmia    AFIB HX CV   . GERD (gastroesophageal reflux disease)   . Heart murmur   . History of substance abuse (HCC)   . HTN (hypertension)   . Iron deficiency anemia   . Paroxysmal atrial fibrillation (HCC) 05/01/2013   On Xarelto  . Subaortic membrane    a. 01/2010 Echo: EF 55-60%, No rwma, subaortic membrane with elevated LVOT mean gradient of 21 mmHg, Triv AI, mod dil LA, mildly increased PASP. b. 05/02/2013 Echo:  LVEF 60-65%, grade 1 diastolic dysfunction, mild LVH, subaortic stenosis w/ turbulation in LVOT c/w subaortic membrane (mean gradient 42 mmHg/peak gradient 81 mmHg), mild  biatrial enlargement    Past Surgical History:  Procedure Laterality Date  . CARDIOVERSION N/A 11/04/2013   Procedure: CARDIOVERSION;  Surgeon: Peter M Swaziland, MD;  Location: [facility];  Service: Cardiovascular;  Laterality: N/A;  . CESAREAN SECTION      Family History  Problem Relation Age of Onset  . Diabetes Father        deceased  . Cancer Mother 12       colon  . Stroke  Mother   . Hypertension Sister        alive and well  . Hypertension Sister        alive and well  . Heart attack Brother        deceased @ 94    Social History   Socioeconomic History  . Marital status: Single    Spouse name: Not on file  . Number of children: Not on file  . Years of education: Not on file  . Highest education level: Not on file  Occupational History  . Not on file  Tobacco Use  . Smoking status: Never Smoker  . Smokeless tobacco: Current User    Types: Snuff  Vaping Use  . Vaping Use: Never used  Substance and Sexual Activity  . Alcohol use: Yes    Alcohol/week: 1.0 - 2.0 standard drink    Types: 1 - 2 Glasses of wine per week  . Drug use: No    Types: Marijuana    Comment: Smokes marijuana weekly.  Has not used cocaine in 9 years.  . Sexual activity: Yes    Birth control/protection: None  Other Topics Concern  . Not on file  Social History Narrative   Lives in [place] by herself.  She had been caring for her mother but she died 2 mos ago.  She tries to remain active but does not regularly exercise.   Social Determinants of Health   Financial Resource Strain: Not on file  Food Insecurity: Not on file  Transportation Needs: Not on file  Physical Activity: Not on file  Stress: Not on file  Social Connections: Not on file     Observations/Objective: Awake, alert and oriented x 3   Review of Systems  Constitutional: Positive for chills. Negative for fever, malaise/fatigue and weight loss.  HENT: Negative.  Negative for nosebleeds.   Eyes: Negative.  Negative for blurred vision, double vision and photophobia.  Respiratory: Negative.  Negative for cough and shortness of breath.   Cardiovascular: Negative.  Negative for chest pain, palpitations and leg swelling.  Gastrointestinal: Positive for heartburn. Negative for nausea and vomiting.       Loose stools  Musculoskeletal: Negative.  Negative for myalgias.  Neurological: Positive for tingling.  Negative for dizziness, focal weakness, seizures and headaches.  Psychiatric/Behavioral: Positive for depression. Negative for suicidal ideas. The patient is nervous/anxious.     Assessment and Plan: Windi was seen today for generalized body aches.  Diagnoses and all orders for this visit:  Type 2 diabetes mellitus with complication, without long-term current use of insulin (HCC) -     metFORMIN (GLUCOPHAGE) 1000 MG tablet; TAKE 1 TABLET BY MOUTH 2 TIMES DAILY WITH A MEAL. -     gabapentin (NEURONTIN) 300 MG capsule; TAKE 1 CAPSULE BY MOUTH TWICE A DAY AND TAKE 2 CAPSULES BY MOUTH AT BEDTIME Continue blood sugar control as discussed in office today, low carbohydrate diet, and regular physical exercise as tolerated, 150 minutes per week (30 min each day, 5  days per week, or 50 min 3 days per week). Keep blood sugar logs with fasting goal of 90-130 mg/dl, post prandial (after you eat) less than 180.  For Hypoglycemia: BS <60 and Hyperglycemia BS >400; contact the clinic ASAP. Annual eye exams and foot exams are recommended.   Essential hypertension -     furosemide (LASIX) 20 MG tablet; Take 1 tablet (20 mg total) by mouth daily. -     dabigatran (PRADAXA) 150 MG CAPS capsule; TAKE 1 CAPSULE (150 MG TOTAL) BY MOUTH 2 (TWO) TIMES DAILY. NEEDS CARDIOLOGY APPOINTMENT FOR ADDITIONAL REFILLS -     carvedilol (COREG) 3.125 MG tablet; Take 1 tablet (3.125 mg total) by mouth 2 (two) times daily. -     amLODipine (NORVASC) 10 MG tablet; Take 1 tablet (10 mg total) by mouth daily. Continue all antihypertensives as prescribed.  Remember to bring in your blood pressure log with you for your follow up appointment.  DASH/Mediterranean Diets are healthier choices for HTN.    Depression with anxiety -     hydrOXYzine (ATARAX/VISTARIL) 25 MG tablet; TAKE 1 TABLET BY MOUTH 3 TIMES DAILY AS NEEDED FOR ANXIETY. -     gabapentin (NEURONTIN) 300 MG capsule; TAKE 1 CAPSULE BY MOUTH TWICE A DAY AND TAKE 2 CAPSULES  BY MOUTH AT BEDTIME -     amitriptyline (ELAVIL) 50 MG tablet; Take 1 tablet (50 mg total) by mouth at bedtime.  Diabetic polyneuropathy associated with type 2 diabetes mellitus (HCC) -     gabapentin (NEURONTIN) 300 MG capsule; TAKE 1 CAPSULE BY MOUTH TWICE A DAY AND TAKE 2 CAPSULES BY MOUTH AT BEDTIME  Gastroesophageal reflux disease without esophagitis -     ferrous sulfate 325 (65 FE) MG tablet; One pill daily after a meal INSTRUCTIONS: Avoid GERD Triggers: acidic, spicy or fried foods, caffeine, coffee, sodas,  alcohol and chocolate.    Follow Up Instructions Return in about 3 months (around 03/20/2021).     I discussed the assessment and treatment plan with the patient. The patient was provided an opportunity to ask questions and all were answered. The patient agreed with the plan and demonstrated an understanding of the instructions.   The patient was advised to call back or seek an in-person evaluation if the symptoms worsen or if the condition fails to improve as anticipated.  I provided 18 minutes of non-face-to-face time during this encounter including median intraservice time, reviewing previous notes, labs, imaging, medications and explaining diagnosis and management.  Claiborne Rigg, FNP-BC

## 2020-12-24 ENCOUNTER — Other Ambulatory Visit: Payer: Self-pay | Admitting: Family Medicine

## 2020-12-24 ENCOUNTER — Other Ambulatory Visit: Payer: Self-pay

## 2020-12-24 ENCOUNTER — Ambulatory Visit
Admission: EM | Admit: 2020-12-24 | Discharge: 2020-12-24 | Disposition: A | Discharge status: Home / Self Care | Payer: Medicaid Other | Attending: Family Medicine | Admitting: Family Medicine

## 2020-12-24 DIAGNOSIS — Z1152 Encounter for screening for COVID-19: Secondary | ICD-10-CM

## 2020-12-24 DIAGNOSIS — E785 Hyperlipidemia, unspecified: Secondary | ICD-10-CM

## 2020-12-24 DIAGNOSIS — B349 Viral infection, unspecified: Secondary | ICD-10-CM

## 2020-12-24 MED ORDER — BENZONATATE 100 MG PO CAPS: 100.0000 mg | ORAL_CAPSULE | Freq: Three times a day (TID) | ORAL | 0 refills | Status: DC | PRN

## 2020-12-24 NOTE — ED Provider Notes (Signed)
[facility]    CSN: [id number] Arrival date & time: 12/24/20  1217      History   Chief Complaint Chief Complaint  Patient presents with  . Cough  . Generalized Body Aches  . Chills    HPI Jane Arellano is a 60 y.o. female.   HPI  Patient presents today for Covid testing.  Patient has close exposure to her daughter who was Covid positive a week ago.  Today she complains of cough, generalized body aches and chills.  She has been taking Tylenol for management of body aches.  Cough is nonproductive.  She denies any shortness of breath, weakness, or chest pain.  Past Medical History:  Diagnosis Date  . Atypical chest pain    a. 11/2005 Negative Myoview  . Diastolic dysfunction   . DM2 (diabetes mellitus, type 2) ([place])   . Dysrhythmia    AFIB HX CV   . GERD (gastroesophageal reflux disease)   . Heart murmur   . History of substance abuse ([place])   . HTN (hypertension)   . Iron deficiency anemia   . Paroxysmal atrial fibrillation ([place]) 05/01/2013   On Xarelto  . Subaortic membrane    a. 01/2010 Echo: EF 55-60%, No rwma, subaortic membrane with elevated LVOT mean gradient of 21 mmHg, Triv AI, mod dil LA, mildly increased PASP. b. 05/02/2013 Echo:  LVEF 48-25%, grade 1 diastolic dysfunction, mild LVH, subaortic stenosis w/ turbulation in LVOT c/w subaortic membrane (mean gradient 42 mmHg/peak gradient 81 mmHg), mild biatrial enlargement    Patient Active Problem List   Diagnosis Date Noted  . Dyslipidemia 10/25/2017  . Pap smear for cervical cancer screening 08/18/2016  . Diabetic polyneuropathy associated with type 2 diabetes mellitus ([place]) 01/14/2016  . Diabetic eye exam ([place]) 01/14/2016  . Screening for colorectal cancer 01/14/2016  . Neck pain 06/29/2015  . Type 2 diabetes mellitus with complication, without long-term current use of insulin ([place]) 03/16/2015  . Essential hypertension 03/16/2015  . BV (bacterial vaginosis) 12/11/2014  . Screening for colon  cancer 12/10/2014  . Keloid scar 12/10/2014  . Screening for STD (sexually transmitted disease) 12/10/2014  . Onychomycosis of toenail 12/10/2014  . Cerumen impaction 12/10/2014  . Diabetes mellitus type 2 with neurological manifestations ([place]) 08/14/2014  . Blurry vision, bilateral 08/14/2014  . PAF (paroxysmal atrial fibrillation) ([place]) 05/08/2013  . ETOH abuse 05/02/2013  . Marijuana abuse 05/02/2013  . Diastolic dysfunction 00/37/0488  . Depression with anxiety 05/01/2013  . Subaortic membrane   . HTN (hypertension)   . 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 GERD 04/30/2007    Past Surgical History:  Procedure Laterality Date  . CARDIOVERSION N/A 11/04/2013   Procedure: CARDIOVERSION;  Surgeon: Peter M Martinique, MD;  Location: [facility] ENDOSCOPY;  Service: Cardiovascular;  Laterality: N/A;  . CESAREAN SECTION      OB History   No obstetric history on file.      Home Medications    Prior to Admission medications   Medication Sig Start Date End Date Taking? Authorizing Provider  ACCU-CHEK FASTCLIX LANCETS MISC USE AS DIRECTED 06/22/18   Charlott Rakes, MD  alum & mag hydroxide-simeth (MAALOX/MYLANTA) 200-200-20 MG/5ML suspension Take 15 mLs by mouth every 6 (six) hours as needed for indigestion or heartburn. 10/30/19   Fulp, Cammie, MD  amitriptyline (ELAVIL) 50 MG tablet Take 1 tablet (50 mg total) by mouth at bedtime. 12/21/20 03/21/21  Gildardo Pounds, NP  amLODipine (NORVASC) 10 MG tablet Take 1 tablet (10  mg total) by mouth daily. 12/21/20 03/21/21  Gildardo Pounds, NP  Blood Glucose Calibration (ACCU-CHEK AVIVA) SOLN USE AS INSTRUCTED 01/11/16   Tresa Garter, MD  Blood Glucose Monitoring Suppl (ACCU-CHEK AVIVA PLUS) w/Device KIT USE AS DIRECTED TWICE DAILY AT 8 AM AND 10 PM 01/04/16   Jegede, Marlena Clipper, MD  Calcium Carb-Cholecalciferol (CALCIUM-VITAMIN D3) 500-400 MG-UNIT TABS Take 1 tablet by mouth daily. 04/11/18   Argentina Donovan, PA-C  carvedilol (COREG) 3.125  MG tablet Take 1 tablet (3.125 mg total) by mouth 2 (two) times daily. 12/21/20   Gildardo Pounds, NP  cetirizine (ZYRTEC) 10 MG tablet TAKE 1 TABLET (10 MG TOTAL) BY MOUTH DAILY. AS NEEDED FOR NASAL CONGESTION 09/19/20   Fulp, Cammie, MD  dabigatran (PRADAXA) 150 MG CAPS capsule TAKE 1 CAPSULE (150 MG TOTAL) BY MOUTH 2 (TWO) TIMES DAILY. NEEDS CARDIOLOGY APPOINTMENT FOR ADDITIONAL REFILLS 12/21/20   Gildardo Pounds, NP  fenofibrate (TRICOR) 145 MG tablet TAKE 1 TABLET BY MOUTH EVERY DAY 12/24/20   Charlott Rakes, MD  ferrous sulfate 325 (65 FE) MG tablet One pill daily after a meal 12/21/20   Gildardo Pounds, NP  furosemide (LASIX) 20 MG tablet Take 1 tablet (20 mg total) by mouth daily. 12/21/20   Gildardo Pounds, NP  gabapentin (NEURONTIN) 300 MG capsule TAKE 1 CAPSULE BY MOUTH TWICE A DAY AND TAKE 2 CAPSULES BY MOUTH AT BEDTIME 12/21/20   Gildardo Pounds, NP  glucose blood (ACCU-CHEK AVIVA PLUS) test strip USE AS INSTRUCTED TO CHECK BLOOD SUGAR UP TO 3 TIMES DAILY 06/12/19   Fulp, Cammie, MD  hydrOXYzine (ATARAX/VISTARIL) 25 MG tablet TAKE 1 TABLET BY MOUTH 3 TIMES DAILY AS NEEDED FOR ANXIETY. 12/21/20   Gildardo Pounds, NP  metFORMIN (GLUCOPHAGE) 1000 MG tablet TAKE 1 TABLET BY MOUTH 2 TIMES DAILY WITH A MEAL. 12/21/20   Gildardo Pounds, NP  Multiple Vitamin (MULTIVITAMIN WITH MINERALS) TABS tablet Take 1 tablet by mouth daily.    [provider]  Omega-3 Fatty Acids (FISH OIL) 1000 MG CPDR Take 3 g by mouth daily. Patient taking differently: Take 2 capsules by mouth daily. 04/11/18   Argentina Donovan, PA-C  omeprazole (PRILOSEC) 40 MG capsule Take 1 capsule (40 mg total) by mouth daily. 10/30/19   Fulp, Cammie, MD  polyethylene glycol powder (GAVILAX) 17 GM/SCOOP powder MIX 1 CAPFUL WITH LIQUID AND DRINK ONCE DAILY 10/30/19   Fulp, Cammie, MD  potassium chloride (K-DUR,KLOR-CON) 10 MEQ tablet Take 1 tablet (10 mEq total) by mouth daily. 11/06/13 04/24/19  Tresa Garter, MD    Family  History Family History  Problem Relation Age of Onset  . Diabetes Father        deceased  . Cancer Mother 42       colon  . Stroke Mother   . Hypertension Sister        alive and well  . Hypertension Sister        alive and well  . Heart attack Brother        deceased @ 70    Social History Social History   Tobacco Use  . Smoking status: Never Smoker  . Smokeless tobacco: Current User    Types: Snuff  Vaping Use  . Vaping Use: Never used  Substance Use Topics  . Alcohol use: Yes    Alcohol/week: 1.0 - 2.0 standard drink    Types: 1 - 2 Glasses of wine per week  .  Drug use: No    Types: Marijuana    Comment: Smokes marijuana weekly.  Has not used cocaine in 9 years.     Allergies   Xarelto [rivaroxaban] and Ketorolac tromethamine   Review of Systems Review of Systems Pertinent negatives listed in HPI'  Physical Exam Triage Vital Signs ED Triage Vitals  Enc Vitals Group     BP 12/24/20 1313 131/79     Pulse Rate 12/24/20 1313 (!) 58     Resp 12/24/20 1313 20     Temp 12/24/20 1313 98.2 F (36.8 C)     Temp Source 12/24/20 1313 Oral     SpO2 12/24/20 1313 98 %     Weight 12/24/20 1331 180 lb (81.6 kg)     Height --      Head Circumference --      Peak Flow --      Pain Score 12/24/20 1330 6     Pain Loc --      Pain Edu? --      Excl. in [place]? --    No data found.  Updated Vital Signs BP 131/79 (BP Location: Left Arm)   Pulse (!) 58   Temp 98.2 F (36.8 C) (Oral)   Resp 20   Wt 180 lb (81.6 kg)   SpO2 98%   BMI 28.19 kg/m   Visual Acuity Right Eye Distance:   Left Eye Distance:   Bilateral Distance:    Right Eye Near:   Left Eye Near:    Bilateral Near:     Physical Exam  General Appearance:    Alert, acutely ill appearing, cooperative,w/o distress   HENT:   Normocephalic, ears normal, nares mucosal edema with congestion, rhinorrhea, oropharynx    Eyes:    PERRL, conjunctiva/corneas clear, EOM's intact       Lungs:     Clear to  auscultation bilaterally, respirations unlabored  Heart:    Regular rate and rhythm  Neurologic:   Awake, alert, oriented x 3. No apparent focal neurological           defect.     UC Treatments / Results  Labs (all labs ordered are listed, but only abnormal results are displayed) Labs Reviewed  NOVEL CORONAVIRUS, NAA    EKG   Radiology No results found.  Procedures Procedures (including critical care time)  Medications Ordered in UC Medications - No data to display  Initial Impression / Assessment and Plan / UC Course  I have reviewed the triage vital signs and the nursing notes.  Pertinent labs & imaging results that were available during my care of the patient were reviewed by me and considered in my medical decision making (see chart for details).     COVID/Flu test pending. Symptom management warranted only.  Manage fever with Tylenol and ibuprofen.  Nasal symptoms with over-the-counter antihistamines recommended.  Treatment per discharge medications/discharge instructions.  Red flags/ER precautions given. The most current CDC isolation/quarantine recommendation advised.  Final Clinical Impressions(s) / UC Diagnoses   Final diagnoses:  Encounter for screening for COVID-19  Viral illness     Discharge Instructions     Your COVID 19 results should result within 3-5 days. Negative results are immediately resulted to Mychart. Positive results will receive a follow-up call from our clinic. If symptoms are present, I recommend home quarantine until results are known.  Alternate Tylenol and ibuprofen as needed for body aches and fever.  Symptom management per recommendations discussed today.  If any  breathing difficulty or chest pain develops go immediately to the closest emergency department for evaluation.     ED Prescriptions    Medication Sig Dispense Auth. Provider   benzonatate (TESSALON) 100 MG capsule Take 1-2 capsules (100-200 mg total) by mouth 3 (three) times  daily as needed for cough. 40 capsule Scot Jun, FNP     PDMP not reviewed this encounter.   Scot Jun, FNP 12/24/20 1348

## 2020-12-24 NOTE — ED Triage Notes (Signed)
Patient presents to Urgent Care with complaints of cough, body aches, chills x 1 week. Patient reports she has also been exposed to her daughter who tested positive for COVID. Treating symptoms with Tylenol. PCP instructed pt to come for testing.   Denies fever.

## 2020-12-24 NOTE — Discharge Instructions (Addendum)
Your COVID 19 results should result within 3-5 days. °Negative results are immediately resulted to Mychart.  ° °Positive results will receive a follow-up call from our clinic. If symptoms are present, I recommend home quarantine until results are known.  ° °Alternate Tylenol and ibuprofen as needed for body aches and fever.  Symptom management per recommendations discussed today.  If any breathing difficulty or chest pain develops go immediately to the closest emergency department for evaluation.  °

## 2020-12-25 LAB — SARS-COV-2, NAA 2 DAY TAT

## 2020-12-25 LAB — NOVEL CORONAVIRUS, NAA: SARS-CoV-2, NAA: DETECTED — AB

## 2020-12-26 ENCOUNTER — Telehealth (HOSPITAL_COMMUNITY): Payer: Self-pay | Admitting: Family

## 2020-12-26 NOTE — Telephone Encounter (Signed)
Called to discuss with patient about COVID-19 symptoms and the use of one of the available treatments for those with mild to moderate Covid symptoms and at a high risk of hospitalization.  Pt appears to qualify for outpatient treatment due to co-morbid conditions and/or a member of an at-risk group in accordance with the FDA Emergency Use Authorization.    Unable to reach pt - VM left  Katlynne Mckercher   

## 2020-12-29 ENCOUNTER — Other Ambulatory Visit: Payer: Self-pay | Admitting: Family Medicine

## 2020-12-29 DIAGNOSIS — I10 Essential (primary) hypertension: Secondary | ICD-10-CM

## 2020-12-29 MED ORDER — FUROSEMIDE 20 MG PO TABS: 20.0000 mg | ORAL_TABLET | Freq: Every day | ORAL | 1 refills | Status: DC

## 2020-12-29 NOTE — Telephone Encounter (Signed)
Medication Refill - Medication: furosemide (LASIX) 20 MG tablet  Pt cannot remember the name of her acid reflux medication   Has the patient contacted their pharmacy? No. (Agent: If no, request that the patient contact the pharmacy for the refill.) (Agent: If yes, when and what did the pharmacy advise?)  Preferred Pharmacy (with phone number or street name):  [id number] [place], [place] - [phone]  [address] [place] [address]  Phone: [phone] Fax: [phone]     Agent: Please be advised that RX refills may take up to 3 business days. We ask that you follow-up with your pharmacy.

## 2020-12-29 NOTE — Telephone Encounter (Signed)
Requested Prescriptions  Pending Prescriptions Disp Refills  . furosemide (LASIX) 20 MG tablet 30 tablet 2    Sig: Take 1 tablet (20 mg total) by mouth daily.     Cardiovascular:  Diuretics - Loop Failed - 12/29/2020  7:02 PM      Failed - Ca in normal range and within 360 days    Calcium  Date Value Ref Range Status  11/09/2020 10.6 (H) 8.7 - 10.3 mg/dL Final   Calcium, Ion  Date Value Ref Range Status  03/18/2014 1.22 1.12 - 1.23 mmol/L Final         Passed - K in normal range and within 360 days    Potassium  Date Value Ref Range Status  11/09/2020 4.5 3.5 - 5.2 mmol/L Final         Passed - Na in normal range and within 360 days    Sodium  Date Value Ref Range Status  11/09/2020 143 134 - 144 mmol/L Final         Passed - Cr in normal range and within 360 days    Creat  Date Value Ref Range Status  08/18/2016 0.64 0.50 - 1.05 mg/dL Final    Comment:      For patients > or = 61 years of age: The upper reference limit for Creatinine is approximately 13% higher for people identified as African-American.      Creatinine, Ser  Date Value Ref Range Status  11/09/2020 0.79 0.57 - 1.00 mg/dL Final   Creatinine, POC  Date Value Ref Range Status  04/26/2017 50 mg/dL Final   Creatinine, Urine  Date Value Ref Range Status  12/10/2014 38.8 mg/dL Final    Comment:    No reference range established.         Passed - Last BP in normal range    BP Readings from Last 1 Encounters:  12/24/20 131/79         Passed - Valid encounter within last 6 months    Recent Outpatient Visits          1 week ago Type 2 diabetes mellitus with complication, without long-term current use of insulin (HCC)   [facility] [place], Shea Stakes, NP   8 months ago Well female exam with routine gynecological exam   [facility] Fulp, [place], MD   1 year ago Diabetic polyneuropathy associated with type 2 diabetes mellitus (HCC)   [facility] F[place], [place], MD   1 year ago Type 2 diabetes mellitus with complication, without long-term current use of insulin (HCC)   [facility] Fulp, [place], MD   1 year ago Gastroesophageal reflux disease without esophagitis   [facility] [place], [place], MD      Future Appointments            In 1 month Claiborne Rigg, NP [facility]

## 2021-01-01 DIAGNOSIS — Z5181 Encounter for therapeutic drug level monitoring: Secondary | ICD-10-CM | POA: Diagnosis not present

## 2021-01-07 ENCOUNTER — Other Ambulatory Visit: Payer: Self-pay | Admitting: Nurse Practitioner

## 2021-01-07 ENCOUNTER — Other Ambulatory Visit: Payer: Self-pay | Admitting: Family Medicine

## 2021-01-07 DIAGNOSIS — I10 Essential (primary) hypertension: Secondary | ICD-10-CM

## 2021-01-07 DIAGNOSIS — E785 Hyperlipidemia, unspecified: Secondary | ICD-10-CM

## 2021-01-07 DIAGNOSIS — F418 Other specified anxiety disorders: Secondary | ICD-10-CM

## 2021-01-10 ENCOUNTER — Other Ambulatory Visit: Payer: Self-pay | Admitting: Nurse Practitioner

## 2021-01-10 DIAGNOSIS — F418 Other specified anxiety disorders: Secondary | ICD-10-CM

## 2021-01-13 DIAGNOSIS — Z5181 Encounter for therapeutic drug level monitoring: Secondary | ICD-10-CM | POA: Diagnosis not present

## 2021-01-17 ENCOUNTER — Other Ambulatory Visit: Payer: Self-pay | Admitting: Nurse Practitioner

## 2021-01-17 DIAGNOSIS — I10 Essential (primary) hypertension: Secondary | ICD-10-CM

## 2021-01-18 ENCOUNTER — Other Ambulatory Visit: Payer: Self-pay | Admitting: Family Medicine

## 2021-01-18 MED ORDER — OMEPRAZOLE 40 MG PO CPDR: 40.0000 mg | DELAYED_RELEASE_CAPSULE | Freq: Every day | ORAL | 0 refills | Status: DC

## 2021-01-18 NOTE — Telephone Encounter (Signed)
Medication Refill - Medication: omeprazole (PRILOSEC) 40 MG capsule    Has the patient contacted their pharmacy? Yes.   (Agent: If no, request that the patient contact the pharmacy for the refill.) (Agent: If yes, when and what did the pharmacy advise?)have sent request/ call pcp   Preferred Pharmacy (with phone number or street name): [id number] [place], [place] - [phone] AT [facility]  [address] [name] [address]  Phone:  [phone] Fax:  [phone]   Agent: Please be advised that RX refills may take up to 3 business days. We ask that you follow-up with your pharmacy.

## 2021-01-28 ENCOUNTER — Encounter (HOSPITAL_COMMUNITY): Payer: Self-pay | Admitting: Nurse Practitioner

## 2021-01-28 ENCOUNTER — Other Ambulatory Visit: Payer: Self-pay

## 2021-01-28 ENCOUNTER — Ambulatory Visit (HOSPITAL_COMMUNITY)
Admission: RE | Admit: 2021-01-28 | Discharge: 2021-01-28 | Disposition: A | Discharge status: Home / Self Care | Payer: Medicaid Other | Source: Ambulatory Visit | Attending: Nurse Practitioner | Admitting: Nurse Practitioner

## 2021-01-28 VITALS — BP 122/68 | HR 65 | Ht 67.0 in | Wt 189.0 lb

## 2021-01-28 DIAGNOSIS — Z7984 Long term (current) use of oral hypoglycemic drugs: Secondary | ICD-10-CM | POA: Insufficient documentation

## 2021-01-28 DIAGNOSIS — E1142 Type 2 diabetes mellitus with diabetic polyneuropathy: Secondary | ICD-10-CM | POA: Diagnosis not present

## 2021-01-28 DIAGNOSIS — Z888 Allergy status to other drugs, medicaments and biological substances status: Secondary | ICD-10-CM | POA: Insufficient documentation

## 2021-01-28 DIAGNOSIS — I350 Nonrheumatic aortic (valve) stenosis: Secondary | ICD-10-CM | POA: Diagnosis not present

## 2021-01-28 DIAGNOSIS — Z7901 Long term (current) use of anticoagulants: Secondary | ICD-10-CM | POA: Diagnosis not present

## 2021-01-28 DIAGNOSIS — I10 Essential (primary) hypertension: Secondary | ICD-10-CM | POA: Diagnosis not present

## 2021-01-28 DIAGNOSIS — I480 Paroxysmal atrial fibrillation: Secondary | ICD-10-CM | POA: Insufficient documentation

## 2021-01-28 DIAGNOSIS — D6869 Other thrombophilia: Secondary | ICD-10-CM | POA: Diagnosis not present

## 2021-01-28 DIAGNOSIS — Z79899 Other long term (current) drug therapy: Secondary | ICD-10-CM | POA: Insufficient documentation

## 2021-01-28 NOTE — Progress Notes (Signed)
Patient ID: Jane Arellano, female   DOB: February 06, 1961, 60 y.o.   MRN: [id number]     Primary Care Physician: Antony Blackbird, MD (Inactive) Referring Physician: Dr. Candy Sledge Tigue is a 60 y.o. female with a h/o PAF in the afib clinic for evaluation. Dx'ed 05/05/15, asymtpomatic, picked up on physical exam. Apparently very little afib burden since then.  She reports that she feels well other than dealing with peripheral neuropathy from her DM. She has not noticed any reoccurring afib. Continues on DOAC with a   CHA2DS2-VASc Score of at least 4.   Lifestyle habits reviewed and pt reports very little caffeine, no alcohol, walks daily. Denies snoring. Discussed avoiding decongestants going into cold/flu season.  F/u in afib clinic, 04/16/20. She states that she feels great. No issues with afib. Recently, saw PCP for physical with labs.   In afib clinic, 01/28/21. She reports that she feels good. No afib to report. No issues with pradaxa with a CHA2DS2VASc score of 3. Will repeat echo for h/o mild aortic  stenosis with mild to mod on echo in 2020. She did get covid form her daughter in February 2022,  but just had  cold symptoms. Her daughter, unvaccinated did not fare so well, was hospitalized with covid pneumonia, but is on the mend now.   Today, she denies symptoms of palpitations, chest pain, shortness of breath, orthopnea, PND, lower extremity edema, dizziness, presyncope, syncope, or neurologic sequela. The patient is tolerating medications without difficulties and is otherwise without complaint today.   Past Medical History:  Diagnosis Date  . Atypical chest pain    a. 11/2005 Negative Myoview  . Diastolic dysfunction   . DM2 (diabetes mellitus, type 2) ([place])   . Dysrhythmia    AFIB HX CV   . GERD (gastroesophageal reflux disease)   . Heart murmur   . History of substance abuse ([place])   . HTN (hypertension)   . Iron deficiency anemia   . Paroxysmal atrial fibrillation ([place]) 05/01/2013    On Xarelto  . Subaortic membrane    a. 01/2010 Echo: EF 55-60%, No rwma, subaortic membrane with elevated LVOT mean gradient of 21 mmHg, Triv AI, mod dil LA, mildly increased PASP. b. 05/02/2013 Echo:  LVEF 76-16%, grade 1 diastolic dysfunction, mild LVH, subaortic stenosis w/ turbulation in LVOT c/w subaortic membrane (mean gradient 42 mmHg/peak gradient 81 mmHg), mild biatrial enlargement   Past Surgical History:  Procedure Laterality Date  . CARDIOVERSION N/A 11/04/2013   Procedure: CARDIOVERSION;  Surgeon: Peter M Martinique, MD;  Location: [facility] ENDOSCOPY;  Service: Cardiovascular;  Laterality: N/A;  . CESAREAN SECTION      Current Outpatient Medications  Medication Sig Dispense Refill  . ACCU-CHEK FASTCLIX LANCETS MISC USE AS DIRECTED 100 each 0  . alum & mag hydroxide-simeth (MAALOX/MYLANTA) 200-200-20 MG/5ML suspension Take 15 mLs by mouth every 6 (six) hours as needed for indigestion or heartburn. 355 mL 5  . amitriptyline (ELAVIL) 50 MG tablet Take 1 tablet (50 mg total) by mouth at bedtime. 90 tablet 0  . amLODipine (NORVASC) 10 MG tablet Take 1 tablet (10 mg total) by mouth daily. 90 tablet 0  . benzonatate (TESSALON) 100 MG capsule Take 1-2 capsules (100-200 mg total) by mouth 3 (three) times daily as needed for cough. 40 capsule 0  . Blood Glucose Calibration (ACCU-CHEK AVIVA) SOLN USE AS INSTRUCTED 1 each 0  . Blood Glucose Monitoring Suppl (ACCU-CHEK AVIVA PLUS) w/Device KIT USE AS DIRECTED TWICE DAILY AT  8 AM AND 10 PM 1 kit 0  . Calcium Carb-Cholecalciferol (CALCIUM-VITAMIN D3) 500-400 MG-UNIT TABS Take 1 tablet by mouth daily. 60 tablet 5  . carvedilol (COREG) 3.125 MG tablet Take 1 tablet (3.125 mg total) by mouth 2 (two) times daily. 180 tablet 0  . cetirizine (ZYRTEC) 10 MG tablet TAKE 1 TABLET (10 MG TOTAL) BY MOUTH DAILY. AS NEEDED FOR NASAL CONGESTION 90 tablet 0  . dabigatran (PRADAXA) 150 MG CAPS capsule TAKE 1 CAPSULE (150 MG TOTAL) BY MOUTH 2 (TWO) TIMES DAILY. NEEDS  CARDIOLOGY APPOINTMENT FOR ADDITIONAL REFILLS 60 capsule 1  . fenofibrate (TRICOR) 145 MG tablet TAKE 1 TABLET BY MOUTH EVERY DAY 30 tablet 0  . ferrous sulfate 325 (65 FE) MG tablet One pill daily after a meal 90 tablet 3  . furosemide (LASIX) 20 MG tablet Take 1 tablet (20 mg total) by mouth daily. 30 tablet 1  . gabapentin (NEURONTIN) 300 MG capsule TAKE 1 CAPSULE BY MOUTH TWICE A DAY AND TAKE 2 CAPSULES BY MOUTH AT BEDTIME 360 capsule 1  . glucose blood (ACCU-CHEK AVIVA PLUS) test strip USE AS INSTRUCTED TO CHECK BLOOD SUGAR UP TO 3 TIMES DAILY 100 each prn  . hydrOXYzine (ATARAX/VISTARIL) 25 MG tablet TAKE 1 TABLET BY MOUTH 3 TIMES DAILY AS NEEDED FOR ANXIETY. 60 tablet 5  . metFORMIN (GLUCOPHAGE) 1000 MG tablet TAKE 1 TABLET BY MOUTH 2 TIMES DAILY WITH A MEAL. 180 tablet 3  . Multiple Vitamin (MULTIVITAMIN WITH MINERALS) TABS tablet Take 1 tablet by mouth daily.    . Omega-3 Fatty Acids (FISH OIL) 1000 MG CPDR Take 3 g by mouth daily. (Patient taking differently: Take 2 capsules by mouth daily.) 90 capsule 6  . omeprazole (PRILOSEC) 40 MG capsule Take 1 capsule (40 mg total) by mouth daily. 90 capsule 0  . polyethylene glycol powder (GAVILAX) 17 GM/SCOOP powder MIX 1 CAPFUL WITH LIQUID AND DRINK ONCE DAILY 476 g 6   No current facility-administered medications for this encounter.    Allergies  Allergen Reactions  . Xarelto [Rivaroxaban] Swelling    Gums bled, headaches  . Ketorolac Tromethamine Other (See Comments)    Unknown reaction    Social History   Socioeconomic History  . Marital status: Single    Spouse name: Not on file  . Number of children: Not on file  . Years of education: Not on file  . Highest education level: Not on file  Occupational History  . Not on file  Tobacco Use  . Smoking status: Never Smoker  . Smokeless tobacco: Current User    Types: Snuff  Vaping Use  . Vaping Use: Never used  Substance and Sexual Activity  . Alcohol use: Yes     Alcohol/week: 1.0 - 2.0 standard drink    Types: 1 - 2 Glasses of wine per week  . Drug use: No    Types: Marijuana    Comment: Smokes marijuana weekly.  Has not used cocaine in 9 years.  . Sexual activity: Yes    Birth control/protection: None  Other Topics Concern  . Not on file  Social History Narrative   Lives in [place] by herself.  She had been caring for her mother but she died 2 mos ago.  She tries to remain active but does not regularly exercise.   Social Determinants of Health   Financial Resource Strain: Not on file  Food Insecurity: Not on file  Transportation Needs: Not on file  Physical Activity: Not on  file  Stress: Not on file  Social Connections: Not on file  Intimate Partner Violence: Not on file    Family History  Problem Relation Age of Onset  . Diabetes Father        deceased  . Cancer Mother 68       colon  . Stroke Mother   . Hypertension Sister        alive and well  . Hypertension Sister        alive and well  . Heart attack Brother        deceased @ 1    ROS- All systems are reviewed and negative except as per the HPI above  Physical Exam: There were no vitals filed for this visit.  GEN- The patient is well appearing, alert and oriented x 3 today.   Head- normocephalic, atraumatic Eyes-  Sclera clear, conjunctiva pink Ears- hearing intact Oropharynx- clear Neck- supple, no JVP Lymph- no cervical lymphadenopathy Lungs- Clear to ausculation bilaterally, normal work of breathing Heart- Regular rate and rhythm, 2/6 systolic murmiur murmurs, rubs or gallops, PMI not laterally displaced GI- soft, NT, ND, + BS Extremities- no clubbing, cyanosis, or edema MS- no significant deformity or atrophy Skin- no rash or lesion Psych- euthymic mood, full affect Neuro- strength and sensation are intact  EKG-SR at 65 bpm, qrs int 80 ms, qtc 463  ms  Epic records reviewed  Echo- 1. Left ventricular ejection fraction, by visual estimation, is 60 to 65%.  The left ventricle has normal function. There is mildly increased left ventricular hypertrophy. 2. Global right ventricle has normal systolic function.The right ventricular size is normal. No increase in right ventricular wall thickness. 3. Left atrial size was normal. 4. Right atrial size was normal. 5. The mitral valve is normal in structure. No evidence of mitral valve regurgitation. 6. The tricuspid valve is normal in structure. Tricuspid valve regurgitation is mild. 7. The aortic valve is tricuspid. Aortic valve regurgitation is mild to moderate. Mild aortic valve stenosis. 8. The pulmonic valve was not well visualized. Pulmonic valve regurgitation is not visualized. 9. The inferior vena cava is normal in size with greater than 50% respiratory variability, suggesting right atrial pressure of 3 mmHg. 10. The tricuspid regurgitant velocity is 2.36 m/s, and with an assumed right atrial pressure of 3 mmHg, the estimated right ventricular systolic pressure is normal at 25.3 mmHg.  Assessment and Plan:  1. PAF No know burden  Continues in SR Continue carvedilol Continue pradaxa for chadsvasc of at least 4, no bleeding issues   2. HTN Stable   3. Mild aortic stenosis with mild to mod regurgitation Will repeat echo      F/u in afib clinic in 6 months  Butch Penny C. Jaiden Wahab, [facility] [address] [place], [place] [address] [phone]

## 2021-02-09 ENCOUNTER — Telehealth: Payer: Medicaid Other | Admitting: Nurse Practitioner

## 2021-02-10 ENCOUNTER — Other Ambulatory Visit: Payer: Self-pay | Admitting: Nurse Practitioner

## 2021-02-10 DIAGNOSIS — E785 Hyperlipidemia, unspecified: Secondary | ICD-10-CM

## 2021-02-10 DIAGNOSIS — I10 Essential (primary) hypertension: Secondary | ICD-10-CM

## 2021-02-10 NOTE — Telephone Encounter (Signed)
Cardiology appt 02/26/21

## 2021-02-26 ENCOUNTER — Ambulatory Visit (HOSPITAL_COMMUNITY)
Admission: RE | Admit: 2021-02-26 | Discharge: 2021-02-26 | Disposition: A | Discharge status: Home / Self Care | Payer: Medicaid Other | Source: Ambulatory Visit | Attending: Nurse Practitioner | Admitting: Nurse Practitioner

## 2021-02-26 ENCOUNTER — Other Ambulatory Visit: Payer: Self-pay

## 2021-02-26 DIAGNOSIS — I10 Essential (primary) hypertension: Secondary | ICD-10-CM | POA: Diagnosis not present

## 2021-02-26 DIAGNOSIS — E119 Type 2 diabetes mellitus without complications: Secondary | ICD-10-CM | POA: Diagnosis not present

## 2021-02-26 DIAGNOSIS — I351 Nonrheumatic aortic (valve) insufficiency: Secondary | ICD-10-CM | POA: Diagnosis not present

## 2021-02-26 DIAGNOSIS — E785 Hyperlipidemia, unspecified: Secondary | ICD-10-CM | POA: Diagnosis not present

## 2021-02-26 DIAGNOSIS — I480 Paroxysmal atrial fibrillation: Secondary | ICD-10-CM | POA: Insufficient documentation

## 2021-02-26 LAB — ECHOCARDIOGRAM COMPLETE
AR max vel: 1.28 cm2
AV Area VTI: 1.31 cm2
AV Area mean vel: 1.31 cm2
AV Mean grad: 11.7 mmHg
AV Peak grad: 22.8 mmHg
Ao pk vel: 2.39 m/s
Area-P 1/2: 3 cm2
S' Lateral: 3.4 cm

## 2021-02-26 NOTE — Progress Notes (Signed)
  Echocardiogram 2D Echocardiogram has been performed.  Jane Arellano 02/26/2021, 11:01 AM

## 2021-03-01 DIAGNOSIS — Z5181 Encounter for therapeutic drug level monitoring: Secondary | ICD-10-CM | POA: Diagnosis not present

## 2021-03-02 ENCOUNTER — Encounter (HOSPITAL_COMMUNITY): Payer: Self-pay | Admitting: *Deleted

## 2021-03-09 ENCOUNTER — Other Ambulatory Visit: Payer: Self-pay | Admitting: Nurse Practitioner

## 2021-03-09 DIAGNOSIS — E785 Hyperlipidemia, unspecified: Secondary | ICD-10-CM

## 2021-03-09 DIAGNOSIS — I10 Essential (primary) hypertension: Secondary | ICD-10-CM

## 2021-03-09 NOTE — Telephone Encounter (Signed)
Requested medication (s) are due for refill today: yes  Requested medication (s) are on the active medication list: ys  Last refill:  02/10/2021  Future visit scheduled: no  Notes to clinic: Patient due for follow up on 03/20/2021 Appointment has not been scheduled yet    Requested Prescriptions  Pending Prescriptions Disp Refills   PRADAXA 150 MG CAPS capsule [Pharmacy Med Name: PRADAXA 150 MG CAPSULE] 60 capsule 1    Sig: TAKE 1 CAPSULE BY MOUTH 2 (TWO) TIMES DAILY. NEEDS CARDIOLOGY APPOINTMENT FOR ADDITIONAL REFILLS      Hematology:  Anticoagulants Passed - 03/09/2021  9:32 AM      Passed - HGB in normal range and within 360 days    Hemoglobin  Date Value Ref Range Status  11/09/2020 12.3 11.1 - 15.9 g/dL Final          Passed - PLT in normal range and within 360 days    Platelets  Date Value Ref Range Status  11/09/2020 203 150 - 450 x10E3/uL Final          Passed - HCT in normal range and within 360 days    Hematocrit  Date Value Ref Range Status  11/09/2020 35.9 34.0 - 46.6 % Final          Passed - Cr in normal range and within 360 days    Creat  Date Value Ref Range Status  08/18/2016 0.64 0.50 - 1.05 mg/dL Final    Comment:      For patients > or = 61 years of age: The upper reference limit for Creatinine is approximately 13% higher for people identified as African-American.      Creatinine, Ser  Date Value Ref Range Status  11/09/2020 0.79 0.57 - 1.00 mg/dL Final   Creatinine, POC  Date Value Ref Range Status  04/26/2017 50 mg/dL Final   Creatinine, Urine  Date Value Ref Range Status  12/10/2014 38.8 mg/dL Final    Comment:    No reference range established.          Passed - Valid encounter within last 12 months    Recent Outpatient Visits           2 months ago Type 2 diabetes mellitus with complication, without long-term current use of insulin ([place])   [place], Vernia Buff, NP   11 months ago Well  female exam with routine gynecological exam   [place] Fulp, [place], MD   1 year ago Diabetic polyneuropathy associated with type 2 diabetes mellitus ([place])   [place] Fulp, [place], MD   1 year ago Type 2 diabetes mellitus with complication, without long-term current use of insulin ([place])   [place] Fulp, [place], MD   1 year ago Gastroesophageal reflux disease without esophagitis   [place] Fulp, [place], MD                  fenofibrate (TRICOR) 145 MG tablet [Pharmacy Med Name: FENOFIBRATE 145 MG TABLET] 30 tablet 0    Sig: TAKE 1 TABLET BY MOUTH EVERY DAY      Cardiovascular:  Antilipid - Fibric Acid Derivatives Failed - 03/09/2021  9:32 AM      Failed - LDL in normal range and within 360 days    LDL Chol Calc (NIH)  Date Value Ref Range Status  04/13/2020 59 0 -  99 mg/dL Final          Failed - Triglycerides in normal range and within 360 days    Triglycerides  Date Value Ref Range Status  04/13/2020 163 (H) 0 - 149 mg/dL Final          Failed - ALT in normal range and within 180 days    ALT  Date Value Ref Range Status  04/13/2020 11 0 - 32 IU/L Final          Failed - AST in normal range and within 180 days    AST  Date Value Ref Range Status  04/13/2020 15 0 - 40 IU/L Final          Passed - Total Cholesterol in normal range and within 360 days    Cholesterol, Total  Date Value Ref Range Status  04/13/2020 140 100 - 199 mg/dL Final          Passed - HDL in normal range and within 360 days    HDL  Date Value Ref Range Status  04/13/2020 54 >39 mg/dL Final          Passed - Cr in normal range and within 180 days    Creat  Date Value Ref Range Status  08/18/2016 0.64 0.50 - 1.05 mg/dL Final    Comment:      For patients > or = 61 years of age: The upper reference limit for Creatinine is approximately 13% higher for  people identified as African-American.      Creatinine, Ser  Date Value Ref Range Status  11/09/2020 0.79 0.57 - 1.00 mg/dL Final   Creatinine, POC  Date Value Ref Range Status  04/26/2017 50 mg/dL Final   Creatinine, Urine  Date Value Ref Range Status  12/10/2014 38.8 mg/dL Final    Comment:    No reference range established.          Passed - AA eGFR in normal range and within 180 days    GFR, Est African American  Date Value Ref Range Status  08/18/2016 >89 >=60 mL/min Final   GFR calc Af Amer  Date Value Ref Range Status  11/09/2020 94 >59 mL/min/1.73 Final    Comment:    **In accordance with recommendations from the NKF-ASN Task force,**   [facility] is in the process of updating its eGFR calculation to the   2021 CKD-EPI creatinine equation that estimates kidney function   without a race variable.    GFR, Est Non African American  Date Value Ref Range Status  08/18/2016 >89 >=60 mL/min Final   GFR calc non Af Amer  Date Value Ref Range Status  11/09/2020 82 >59 mL/min/1.73 Final   GFR  Date Value Ref Range Status  10/30/2013 120.29 >60.00 mL/min Final          Passed - Valid encounter within last 12 months    Recent Outpatient Visits           2 months ago Type 2 diabetes mellitus with complication, without long-term current use of insulin ([place])   [place], Vernia Buff, NP   11 months ago Well female exam with routine gynecological exam   [place] Fulp, [place], MD   1 year ago Diabetic polyneuropathy associated with type 2 diabetes mellitus ([place])   [place] Fulp, [place], MD   1 year ago Type 2 diabetes mellitus with complication, without  long-term current use of insulin ([place])   [place] [facility] Fulp, [place], MD   1 year ago Gastroesophageal reflux disease without esophagitis    [facility] [place],  [place], MD

## 2021-03-22 DIAGNOSIS — Z5181 Encounter for therapeutic drug level monitoring: Secondary | ICD-10-CM | POA: Diagnosis not present

## 2021-03-29 ENCOUNTER — Other Ambulatory Visit: Payer: Self-pay | Admitting: Family Medicine

## 2021-03-29 ENCOUNTER — Other Ambulatory Visit: Payer: Self-pay | Admitting: Nurse Practitioner

## 2021-03-29 DIAGNOSIS — E785 Hyperlipidemia, unspecified: Secondary | ICD-10-CM

## 2021-03-29 DIAGNOSIS — I10 Essential (primary) hypertension: Secondary | ICD-10-CM

## 2021-03-29 DIAGNOSIS — F418 Other specified anxiety disorders: Secondary | ICD-10-CM

## 2021-03-29 NOTE — Telephone Encounter (Signed)
Requested Prescriptions  Pending Prescriptions Disp Refills  . furosemide (LASIX) 20 MG tablet [Pharmacy Med Name: FUROSEMIDE 20 MG TABLET] 90 tablet 0    Sig: TAKE 1 TABLET BY MOUTH EVERY DAY     Cardiovascular:  Diuretics - Loop Failed - 03/29/2021 11:27 AM      Failed - Ca in normal range and within 360 days    Calcium  Date Value Ref Range Status  11/09/2020 10.6 (H) 8.7 - 10.3 mg/dL Final   Calcium, Ion  Date Value Ref Range Status  03/18/2014 1.22 1.12 - 1.23 mmol/L Final         Passed - K in normal range and within 360 days    Potassium  Date Value Ref Range Status  11/09/2020 4.5 3.5 - 5.2 mmol/L Final         Passed - Na in normal range and within 360 days    Sodium  Date Value Ref Range Status  11/09/2020 143 134 - 144 mmol/L Final         Passed - Cr in normal range and within 360 days    Creat  Date Value Ref Range Status  08/18/2016 0.64 0.50 - 1.05 mg/dL Final    Comment:      For patients > or = 61 years of age: The upper reference limit for Creatinine is approximately 13% higher for people identified as African-American.      Creatinine, Ser  Date Value Ref Range Status  11/09/2020 0.79 0.57 - 1.00 mg/dL Final   Creatinine, POC  Date Value Ref Range Status  04/26/2017 50 mg/dL Final   Creatinine, Urine  Date Value Ref Range Status  12/10/2014 38.8 mg/dL Final    Comment:    No reference range established.         Passed - Last BP in normal range    BP Readings from Last 1 Encounters:  01/28/21 122/68         Passed - Valid encounter within last 6 months    Recent Outpatient Visits          3 months ago Type 2 diabetes mellitus with complication, without long-term current use of insulin (HCC)   [place] [facility] [place], Shea Stakes, NP   11 months ago Well female exam with routine gynecological exam   [place] [facility] Fulp, [place], MD   1 year ago Diabetic polyneuropathy associated with  type 2 diabetes mellitus (HCC)   [place] [facility] Fulp, [place], MD   1 year ago Type 2 diabetes mellitus with complication, without long-term current use of insulin (HCC)    [facility] Fulp, [place], MD   1 year ago Gastroesophageal reflux disease without esophagitis    [facility] Fulp, [place], MD             . amLODipine (NORVASC) 10 MG tablet [Pharmacy Med Name: AMLODIPINE BESYLATE 10 MG TAB] 90 tablet 0    Sig: TAKE 1 TABLET BY MOUTH EVERY DAY     Cardiovascular:  Calcium Channel Blockers Passed - 03/29/2021 11:27 AM      Passed - Last BP in normal range    BP Readings from Last 1 Encounters:  01/28/21 122/68         Passed - Valid encounter within last 6 months    Recent Outpatient Visits          3 months  ago Type 2 diabetes mellitus with complication, without long-term current use of insulin (HCC)   [place] [facility] [place], Shea Stakes, NP   11 months ago Well female exam with routine gynecological exam   [place] [facility] Fulp, [place], MD   1 year ago Diabetic polyneuropathy associated with type 2 diabetes mellitus (HCC)   [place] [facility] Fulp, [place], MD   1 year ago Type 2 diabetes mellitus with complication, without long-term current use of insulin (HCC)   [place] [facility] Fulp, [place], MD   1 year ago Gastroesophageal reflux disease without esophagitis   [place] [facility] Fulp, [place], MD             . amitriptyline (ELAVIL) 50 MG tablet [Pharmacy Med Name: AMITRIPTYLINE HCL 50 MG TAB] 90 tablet 0    Sig: TAKE 1 TABLET BY MOUTH EVERYDAY AT BEDTIME     Psychiatry:  Antidepressants - Heterocyclics (TCAs) Passed - 03/29/2021 11:27 AM      Passed - Completed PHQ-2 or PHQ-9 in the last 360 days      Passed - Valid encounter within last 6 months    Recent Outpatient  Visits          3 months ago Type 2 diabetes mellitus with complication, without long-term current use of insulin (HCC)   [place] [facility] [place], Shea Stakes, NP   11 months ago Well female exam with routine gynecological exam   [place] [facility] Fulp, [place], MD   1 year ago Diabetic polyneuropathy associated with type 2 diabetes mellitus (HCC)   [place] [facility] Fulp, [place], MD   1 year ago Type 2 diabetes mellitus with complication, without long-term current use of insulin (HCC)   [place] [facility] Fulp, [place], MD   1 year ago Gastroesophageal reflux disease without esophagitis    [facility] [place], [place], MD

## 2021-03-29 NOTE — Telephone Encounter (Signed)
Requested Prescriptions  Pending Prescriptions Disp Refills  . fenofibrate (TRICOR) 145 MG tablet [Pharmacy Med Name: FENOFIBRATE 145 MG TABLET] 30 tablet 0    Sig: TAKE 1 TABLET BY MOUTH EVERY DAY     Cardiovascular:  Antilipid - Fibric Acid Derivatives Failed - 03/29/2021 11:27 AM      Failed - Triglycerides in normal range and within 360 days    Triglycerides  Date Value Ref Range Status  04/13/2020 163 (H) 0 - 149 mg/dL Final         Failed - ALT in normal range and within 180 days    ALT  Date Value Ref Range Status  04/13/2020 11 0 - 32 IU/L Final         Failed - AST in normal range and within 180 days    AST  Date Value Ref Range Status  04/13/2020 15 0 - 40 IU/L Final         Passed - Total Cholesterol in normal range and within 360 days    Cholesterol, Total  Date Value Ref Range Status  04/13/2020 140 100 - 199 mg/dL Final         Passed - LDL in normal range and within 360 days    LDL Chol Calc (NIH)  Date Value Ref Range Status  04/13/2020 59 0 - 99 mg/dL Final         Passed - HDL in normal range and within 360 days    HDL  Date Value Ref Range Status  04/13/2020 54 >39 mg/dL Final         Passed - Cr in normal range and within 180 days    Creat  Date Value Ref Range Status  08/18/2016 0.64 0.50 - 1.05 mg/dL Final    Comment:      For patients > or = 61 years of age: The upper reference limit for Creatinine is approximately 13% higher for people identified as African-American.      Creatinine, Ser  Date Value Ref Range Status  11/09/2020 0.79 0.57 - 1.00 mg/dL Final   Creatinine, POC  Date Value Ref Range Status  04/26/2017 50 mg/dL Final   Creatinine, Urine  Date Value Ref Range Status  12/10/2014 38.8 mg/dL Final    Comment:    No reference range established.         Passed - AA eGFR in normal range and within 180 days    GFR, Est African American  Date Value Ref Range Status  08/18/2016 >89 >=60 mL/min Final   GFR calc Af Amer   Date Value Ref Range Status  11/09/2020 94 >59 mL/min/1.73 Final    Comment:    **In accordance with recommendations from the NKF-ASN Task force,**   [facility] is in the process of updating its eGFR calculation to the   2021 CKD-EPI creatinine equation that estimates kidney function   without a race variable.    GFR, Est Non African American  Date Value Ref Range Status  08/18/2016 >89 >=60 mL/min Final   GFR calc non Af Amer  Date Value Ref Range Status  11/09/2020 82 >59 mL/min/1.73 Final   GFR  Date Value Ref Range Status  10/30/2013 120.29 >60.00 mL/min Final         Passed - Valid encounter within last 12 months    Recent Outpatient Visits          3 months ago Type 2 diabetes mellitus with complication, without long-term current use  of insulin [facility])   [place], Vernia Buff, NP   11 months ago Well female exam with routine gynecological exam   [place] Fulp, [place], MD   1 year ago Diabetic polyneuropathy associated with type 2 diabetes mellitus ([place])   [place] Fulp, [place], MD   1 year ago Type 2 diabetes mellitus with complication, without long-term current use of insulin ([place])   [place], [place], MD   1 year ago Gastroesophageal reflux disease without esophagitis   [place] Fulp, [place], MD             . omeprazole (PRILOSEC) 40 MG capsule [Pharmacy Med Name: OMEPRAZOLE DR 40 MG CAPSULE] 90 capsule 0    Sig: TAKE 1 CAPSULE (40 MG TOTAL) BY MOUTH DAILY.     Gastroenterology: Proton Pump Inhibitors Passed - 03/29/2021 11:27 AM      Passed - Valid encounter within last 12 months    Recent Outpatient Visits          3 months ago Type 2 diabetes mellitus with complication, without long-term current use of insulin ([place])   [place], Vernia Buff, NP   11  months ago Well female exam with routine gynecological exam   [place] Fulp, [place], MD   1 year ago Diabetic polyneuropathy associated with type 2 diabetes mellitus ([place])   [place] [facility] Fulp, [place], MD   1 year ago Type 2 diabetes mellitus with complication, without long-term current use of insulin ([place])   [place] Fulp, [employer], MD   1 year ago Gastroesophageal reflux disease without esophagitis   [place] [facility] [place], [place], MD

## 2021-04-01 ENCOUNTER — Other Ambulatory Visit: Payer: Self-pay | Admitting: Nurse Practitioner

## 2021-04-01 DIAGNOSIS — I10 Essential (primary) hypertension: Secondary | ICD-10-CM

## 2021-04-26 DIAGNOSIS — Z5181 Encounter for therapeutic drug level monitoring: Secondary | ICD-10-CM | POA: Diagnosis not present

## 2021-04-27 ENCOUNTER — Other Ambulatory Visit: Payer: Self-pay | Admitting: Nurse Practitioner

## 2021-04-27 DIAGNOSIS — E785 Hyperlipidemia, unspecified: Secondary | ICD-10-CM

## 2021-04-27 NOTE — Telephone Encounter (Signed)
Requested medication (s) are due for refill today: yes  Requested medication (s) are on the active medication list:yes   Last refill:  04/01/2021  Future visit scheduled: no  Notes to clinic: Patient was due for follow up on 03/20/2021   Requested Prescriptions  Pending Prescriptions Disp Refills   fenofibrate (TRICOR) 145 MG tablet [Pharmacy Med Name: FENOFIBRATE 145 MG TABLET] 30 tablet 0    Sig: TAKE 1 TABLET BY MOUTH EVERY DAY      Cardiovascular:  Antilipid - Fibric Acid Derivatives Failed - 04/27/2021 10:31 AM      Failed - Total Cholesterol in normal range and within 360 days    Cholesterol, Total  Date Value Ref Range Status  04/13/2020 140 100 - 199 mg/dL Final          Failed - LDL in normal range and within 360 days    LDL Chol Calc (NIH)  Date Value Ref Range Status  04/13/2020 59 0 - 99 mg/dL Final          Failed - HDL in normal range and within 360 days    HDL  Date Value Ref Range Status  04/13/2020 54 >39 mg/dL Final          Failed - Triglycerides in normal range and within 360 days    Triglycerides  Date Value Ref Range Status  04/13/2020 163 (H) 0 - 149 mg/dL Final          Failed - ALT in normal range and within 180 days    ALT  Date Value Ref Range Status  04/13/2020 11 0 - 32 IU/L Final          Failed - AST in normal range and within 180 days    AST  Date Value Ref Range Status  04/13/2020 15 0 - 40 IU/L Final          Passed - Cr in normal range and within 180 days    Creat  Date Value Ref Range Status  08/18/2016 0.64 0.50 - 1.05 mg/dL Final    Comment:      For patients > or = 61 years of age: The upper reference limit for Creatinine is approximately 13% higher for people identified as African-American.      Creatinine, Ser  Date Value Ref Range Status  11/09/2020 0.79 0.57 - 1.00 mg/dL Final   Creatinine, POC  Date Value Ref Range Status  04/26/2017 50 mg/dL Final   Creatinine, Urine  Date Value Ref Range Status   12/10/2014 38.8 mg/dL Final    Comment:    No reference range established.          Passed - AA eGFR in normal range and within 180 days    GFR, Est African American  Date Value Ref Range Status  08/18/2016 >89 >=60 mL/min Final   GFR calc Af Amer  Date Value Ref Range Status  11/09/2020 94 >59 mL/min/1.73 Final    Comment:    **In accordance with recommendations from the NKF-ASN Task force,**   [facility] is in the process of updating its eGFR calculation to the   2021 CKD-EPI creatinine equation that estimates kidney function   without a race variable.    GFR, Est Non African American  Date Value Ref Range Status  08/18/2016 >89 >=60 mL/min Final   GFR calc non Af Amer  Date Value Ref Range Status  11/09/2020 82 >59 mL/min/1.73 Final   GFR  Date Value Ref  Range Status  10/30/2013 120.29 >60.00 mL/min Final          Passed - Valid encounter within last 12 months    Recent Outpatient Visits           4 months ago Type 2 diabetes mellitus with complication, without long-term current use of insulin ([place])   [place], Vernia Buff, NP   1 year ago Well female exam with routine gynecological exam   [place] Fulp, [place], MD   1 year ago Diabetic polyneuropathy associated with type 2 diabetes mellitus ([place])   [facility] Fulp, [place], MD   1 year ago Type 2 diabetes mellitus with complication, without long-term current use of insulin ([place])   [place] Fulp, Etta, MD   2 years ago Gastroesophageal reflux disease without esophagitis   [facility] [place], [place], MD

## 2021-05-27 DIAGNOSIS — Z5181 Encounter for therapeutic drug level monitoring: Secondary | ICD-10-CM | POA: Diagnosis not present

## 2021-05-31 ENCOUNTER — Other Ambulatory Visit: Payer: Self-pay | Admitting: Nurse Practitioner

## 2021-05-31 DIAGNOSIS — F418 Other specified anxiety disorders: Secondary | ICD-10-CM

## 2021-05-31 DIAGNOSIS — I10 Essential (primary) hypertension: Secondary | ICD-10-CM

## 2021-05-31 DIAGNOSIS — E785 Hyperlipidemia, unspecified: Secondary | ICD-10-CM

## 2021-06-25 ENCOUNTER — Other Ambulatory Visit: Payer: Self-pay | Admitting: Nurse Practitioner

## 2021-06-25 DIAGNOSIS — E785 Hyperlipidemia, unspecified: Secondary | ICD-10-CM

## 2021-06-29 ENCOUNTER — Encounter: Payer: Medicaid Other | Admitting: Internal Medicine

## 2021-07-01 ENCOUNTER — Other Ambulatory Visit: Payer: Self-pay | Admitting: Nurse Practitioner

## 2021-07-01 ENCOUNTER — Other Ambulatory Visit: Payer: Self-pay | Admitting: Family Medicine

## 2021-07-01 DIAGNOSIS — I10 Essential (primary) hypertension: Secondary | ICD-10-CM

## 2021-07-05 ENCOUNTER — Other Ambulatory Visit: Payer: Self-pay | Admitting: Nurse Practitioner

## 2021-07-05 DIAGNOSIS — E785 Hyperlipidemia, unspecified: Secondary | ICD-10-CM

## 2021-07-05 NOTE — Telephone Encounter (Signed)
Notes to clinic:  Patient has upcoming appt on 07/30/2021 No show appt on 06/29/2021   Requested Prescriptions  Pending Prescriptions Disp Refills   fenofibrate (TRICOR) 145 MG tablet [Pharmacy Med Name: FENOFIBRATE 145 MG TABLET] 30 tablet 0    Sig: TAKE 1 TABLET BY MOUTH EVERY DAY     Cardiovascular:  Antilipid - Fibric Acid Derivatives Failed - 07/05/2021 11:04 AM      Failed - Total Cholesterol in normal range and within 360 days    Cholesterol, Total  Date Value Ref Range Status  04/13/2020 140 100 - 199 mg/dL Final          Failed - LDL in normal range and within 360 days    LDL Chol Calc (NIH)  Date Value Ref Range Status  04/13/2020 59 0 - 99 mg/dL Final          Failed - HDL in normal range and within 360 days    HDL  Date Value Ref Range Status  04/13/2020 54 >39 mg/dL Final          Failed - Triglycerides in normal range and within 360 days    Triglycerides  Date Value Ref Range Status  04/13/2020 163 (H) 0 - 149 mg/dL Final          Failed - ALT in normal range and within 180 days    ALT  Date Value Ref Range Status  04/13/2020 11 0 - 32 IU/L Final          Failed - AST in normal range and within 180 days    AST  Date Value Ref Range Status  04/13/2020 15 0 - 40 IU/L Final          Failed - Cr in normal range and within 180 days    Creat  Date Value Ref Range Status  08/18/2016 0.64 0.50 - 1.05 mg/dL Final    Comment:      For patients > or = 61 years of age: The upper reference limit for Creatinine is approximately 13% higher for people identified as African-American.      Creatinine, Ser  Date Value Ref Range Status  11/09/2020 0.79 0.57 - 1.00 mg/dL Final   Creatinine, POC  Date Value Ref Range Status  04/26/2017 50 mg/dL Final   Creatinine, Urine  Date Value Ref Range Status  12/10/2014 38.8 mg/dL Final    Comment:    No reference range established.          Failed - AA eGFR in normal range and within 180 days    GFR,  Est African American  Date Value Ref Range Status  08/18/2016 >89 >=60 mL/min Final   GFR calc Af Amer  Date Value Ref Range Status  11/09/2020 94 >59 mL/min/1.73 Final    Comment:    **In accordance with recommendations from the NKF-ASN Task force,**   [facility] is in the process of updating its eGFR calculation to the   2021 CKD-EPI creatinine equation that estimates kidney function   without a race variable.    GFR, Est Non African American  Date Value Ref Range Status  08/18/2016 >89 >=60 mL/min Final   GFR calc non Af Amer  Date Value Ref Range Status  11/09/2020 82 >59 mL/min/1.73 Final   GFR  Date Value Ref Range Status  10/30/2013 120.29 >60.00 mL/min Final          Passed - Valid encounter within last 12 months  Recent Outpatient Visits           6 months ago Type 2 diabetes mellitus with complication, without long-term current use of insulin ([place])   [place], Vernia Buff, NP   1 year ago Well female exam with routine gynecological exam   [place] Fulp, [place], MD   1 year ago Diabetic polyneuropathy associated with type 2 diabetes mellitus ([place])   [place] Fulp, [place], MD   2 years ago Type 2 diabetes mellitus with complication, without long-term current use of insulin ([place])   [place] Fulp, [place], MD   2 years ago Gastroesophageal reflux disease without esophagitis   [place], MD       Future Appointments             In 3 weeks [place] Emery, Dalbert Batman, MD [place]

## 2021-07-16 ENCOUNTER — Other Ambulatory Visit: Payer: Self-pay | Admitting: Family Medicine

## 2021-07-16 ENCOUNTER — Other Ambulatory Visit: Payer: Self-pay | Admitting: Nurse Practitioner

## 2021-07-16 DIAGNOSIS — E785 Hyperlipidemia, unspecified: Secondary | ICD-10-CM

## 2021-07-16 DIAGNOSIS — I10 Essential (primary) hypertension: Secondary | ICD-10-CM

## 2021-07-17 NOTE — Telephone Encounter (Signed)
Requested medications are due for refill today early request, filled for 90 days 06/05/21  Requested medications are on the active medication list yes  Last refill 7/16  Last visit 11/2020  Future visit scheduled 07/30/21  Notes to clinic Please assess.

## 2021-07-17 NOTE — Telephone Encounter (Signed)
Must keep upcoming OV for refills"

## 2021-07-20 DIAGNOSIS — Z5181 Encounter for therapeutic drug level monitoring: Secondary | ICD-10-CM | POA: Diagnosis not present

## 2021-07-30 ENCOUNTER — Ambulatory Visit: Payer: Medicaid Other | Attending: Internal Medicine | Admitting: Internal Medicine

## 2021-07-30 ENCOUNTER — Other Ambulatory Visit (HOSPITAL_COMMUNITY)
Admission: RE | Admit: 2021-07-30 | Discharge: 2021-07-30 | Disposition: A | Discharge status: Home / Self Care | Payer: Medicaid Other | Source: Ambulatory Visit | Attending: Internal Medicine | Admitting: Internal Medicine

## 2021-07-30 ENCOUNTER — Other Ambulatory Visit: Payer: Self-pay

## 2021-07-30 ENCOUNTER — Encounter: Payer: Self-pay | Admitting: Internal Medicine

## 2021-07-30 VITALS — BP 123/77 | HR 97 | Resp 16 | Wt 187.8 lb

## 2021-07-30 DIAGNOSIS — E1142 Type 2 diabetes mellitus with diabetic polyneuropathy: Secondary | ICD-10-CM | POA: Insufficient documentation

## 2021-07-30 DIAGNOSIS — Z113 Encounter for screening for infections with a predominantly sexual mode of transmission: Secondary | ICD-10-CM | POA: Diagnosis not present

## 2021-07-30 DIAGNOSIS — E1140 Type 2 diabetes mellitus with diabetic neuropathy, unspecified: Secondary | ICD-10-CM

## 2021-07-30 DIAGNOSIS — Z833 Family history of diabetes mellitus: Secondary | ICD-10-CM | POA: Insufficient documentation

## 2021-07-30 DIAGNOSIS — Z888 Allergy status to other drugs, medicaments and biological substances status: Secondary | ICD-10-CM | POA: Diagnosis not present

## 2021-07-30 DIAGNOSIS — I480 Paroxysmal atrial fibrillation: Secondary | ICD-10-CM | POA: Diagnosis not present

## 2021-07-30 DIAGNOSIS — Z760 Encounter for issue of repeat prescription: Secondary | ICD-10-CM | POA: Diagnosis not present

## 2021-07-30 DIAGNOSIS — Z23 Encounter for immunization: Secondary | ICD-10-CM | POA: Diagnosis not present

## 2021-07-30 DIAGNOSIS — Z8249 Family history of ischemic heart disease and other diseases of the circulatory system: Secondary | ICD-10-CM | POA: Diagnosis not present

## 2021-07-30 DIAGNOSIS — E785 Hyperlipidemia, unspecified: Secondary | ICD-10-CM | POA: Insufficient documentation

## 2021-07-30 DIAGNOSIS — Z7984 Long term (current) use of oral hypoglycemic drugs: Secondary | ICD-10-CM | POA: Insufficient documentation

## 2021-07-30 DIAGNOSIS — E118 Type 2 diabetes mellitus with unspecified complications: Secondary | ICD-10-CM

## 2021-07-30 DIAGNOSIS — I10 Essential (primary) hypertension: Secondary | ICD-10-CM | POA: Diagnosis not present

## 2021-07-30 DIAGNOSIS — Z79899 Other long term (current) drug therapy: Secondary | ICD-10-CM | POA: Diagnosis not present

## 2021-07-30 DIAGNOSIS — Z7901 Long term (current) use of anticoagulants: Secondary | ICD-10-CM | POA: Insufficient documentation

## 2021-07-30 LAB — POCT GLYCOSYLATED HEMOGLOBIN (HGB A1C): HbA1c, POC (controlled diabetic range): 6.6 % (ref 0.0–7.0)

## 2021-07-30 LAB — GLUCOSE, POCT (MANUAL RESULT ENTRY): POC Glucose: 209 mg/dl — AB (ref 70–99)

## 2021-07-30 MED ORDER — GABAPENTIN 600 MG PO TABS: 600.0000 mg | ORAL_TABLET | Freq: Three times a day (TID) | ORAL | 6 refills | Status: DC

## 2021-07-30 MED ORDER — ACCU-CHEK FASTCLIX LANCETS MISC: 11 refills | Status: DC

## 2021-07-30 MED ORDER — ACCU-CHEK AVIVA PLUS VI STRP: ORAL_STRIP | 11 refills | Status: DC

## 2021-07-30 NOTE — Patient Instructions (Signed)
Increase gabapentin to 600 mg 3 times a day

## 2021-07-30 NOTE — Progress Notes (Signed)
Patient ID: Deniqua Perry, female    DOB: 07-Sep-1961  MRN: [id number]  CC: Follow-up (Patient states she has nerve pain legs.) and Medication Refill (Patient says she is in need of needles and strips./)   Subjective: Chaunte Hornbeck is a 60 y.o. female who presents for chronic ds management.  Previous PCP is Dr. Chapman Fitch who is no longer with the practice. Her concerns today include:  Patient with history of DM type II with neuropathy, HTN, HL, PAF, substance use cocaine, anemia,   DIABETES TYPE 2 Last A1C:   Results for orders placed or performed in visit on 07/30/21  POCT glucose (manual entry)  Result Value Ref Range   POC Glucose 209 (A) 70 - 99 mg/dl  POCT glycosylated hemoglobin (Hb A1C)  Result Value Ref Range   Hemoglobin A1C     HbA1c POC (<> result, manual entry)     HbA1c, POC (prediabetic range)     HbA1c, POC (controlled diabetic range) 6.6 0.0 - 7.0 %    Med Adherence:  [x]  Yes -Metformin   []  No Medication side effects:  []  Yes    [x]  No Home Monitoring?  [x]  Yes 1-2 times/day   []  No Home glucose results range:113-145 Diet Adherence: [x]  Yes    []  No Exercise: [x]  Yes and cuts her own grass and does workout tape every day Hypoglycemic episodes?: []  Yes    []  No Numbness of the feet? Has neuropathy pain like pins and needles in legs and toes.  Gabapentin helps but sometimes it does not. Wants to know if dose can be increased over that she can get something else for pain. Retinopathy hx? []  Yes    []  No Last eye exam: over due for eye exam Comments:   HYPERTENSION Currently taking: see medication list.  She is on amlodipine and carvedilol. Med Adherence: [x]  Yes    []  No Medication side effects: []  Yes    [x]  No Adherence with salt restriction: [x]  Yes    []  No Home Monitoring?: []  Yes    [x]  No Monitoring Frequency:  Home BP results range:  SOB? []  Yes    [x]  No Chest Pain?: []  Yes    [x]  No Leg swelling?: []  Yes    [x]  No Headaches?: []  Yes    [x]   No Dizziness? []  Yes    [x]  No Comments:   PAF: She is on Pradaxa.  She denies any bruising or bleeding at this time.  No palpitations.  She has a follow-up appointment with her cardiologist on  08/04/21  HL:  on Fenofibrate.  Tolerates the medicine well.  Wants STI screen.  Sexually active with 1 female partner.  No vaginal discharge or itching at this time.  HM:  due for flu and pneumonia vaccines. Due for COVID booster Patient Active Problem List   Diagnosis Date Noted   Dyslipidemia 10/25/2017   Pap smear for cervical cancer screening 08/18/2016   Diabetic polyneuropathy associated with type 2 diabetes mellitus ([place]) 01/14/2016   Diabetic eye exam ([place]) 01/14/2016   Screening for colorectal cancer 01/14/2016   Neck pain 06/29/2015   Type 2 diabetes mellitus with complication, without long-term current use of insulin ([place]) 03/16/2015   Essential hypertension 03/16/2015   BV (bacterial vaginosis) 12/11/2014   Screening for colon cancer 12/10/2014   Keloid scar 12/10/2014   Screening for STD (sexually transmitted disease) 12/10/2014   Onychomycosis of toenail 12/10/2014   Cerumen impaction 12/10/2014   Diabetes mellitus  type 2 with neurological manifestations ([place]) 08/14/2014   Blurry vision, bilateral 08/14/2014   PAF (paroxysmal atrial fibrillation) ([place]) 05/08/2013   ETOH abuse 05/02/2013   Marijuana abuse 37/16/9678   Diastolic dysfunction 93/81/0175   Depression with anxiety 05/01/2013   Subaortic membrane    HTN (hypertension)    DEPENDENCE, COCAINE, 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 355 mL 5   amitriptyline (ELAVIL) 50 MG tablet TAKE 1 TABLET BY MOUTH EVERYDAY AT BEDTIME 90 tablet 0   amLODipine (NORVASC) 10 MG tablet  TAKE 1 TABLET BY MOUTH EVERY DAY 90 tablet 0   Blood Glucose Calibration (ACCU-CHEK AVIVA) SOLN USE AS INSTRUCTED 1 each 0   Blood Glucose Monitoring Suppl (ACCU-CHEK AVIVA PLUS) w/Device KIT USE AS DIRECTED TWICE DAILY AT 8 AM AND 10 PM 1 kit 0   Calcium Carb-Cholecalciferol (CALCIUM-VITAMIN D3) 500-400 MG-UNIT TABS Take 1 tablet by mouth daily. 60 tablet 5   carvedilol (COREG) 3.125 MG tablet TAKE 1 TABLET BY MOUTH 2 TIMES DAILY. 60 tablet 0   cetirizine (ZYRTEC) 10 MG tablet TAKE 1 TABLET (10 MG TOTAL) BY MOUTH DAILY. AS NEEDED FOR NASAL CONGESTION 90 tablet 0   dabigatran (PRADAXA) 150 MG CAPS capsule TAKE 1 CAPSULE BY MOUTH 2 (TWO) TIMES DAILY. 60 capsule 2   fenofibrate (TRICOR) 145 MG tablet TAKE 1 TABLET BY MOUTH EVERY DAY 30 tablet 0   ferrous sulfate 325 (65 FE) MG tablet One pill daily after a meal 90 tablet 3   furosemide (LASIX) 20 MG tablet TAKE 1 TABLET BY MOUTH EVERY DAY 30 tablet 0   hydrOXYzine (ATARAX/VISTARIL) 25 MG tablet TAKE 1 TABLET BY MOUTH 3 TIMES DAILY AS NEEDED FOR ANXIETY. 60 tablet 5   metFORMIN (GLUCOPHAGE) 1000 MG tablet TAKE 1 TABLET BY MOUTH 2 TIMES DAILY WITH A MEAL. 180 tablet 3   Multiple Vitamin (MULTIVITAMIN WITH MINERALS) TABS tablet Take 1 tablet by mouth daily.     Omega-3 Fatty Acids (FISH OIL) 1000 MG CPDR Take 3 g by mouth daily. (Patient taking differently: Take 2 capsules by mouth daily.) 90 capsule 6   omeprazole (PRILOSEC) 40 MG capsule TAKE 1 CAPSULE (40 MG TOTAL) BY MOUTH DAILY. 90 capsule 0   polyethylene glycol powder (GAVILAX) 17 GM/SCOOP powder MIX 1 CAPFUL WITH LIQUID AND DRINK ONCE DAILY 476 g 6   [DISCONTINUED] potassium chloride (K-DUR,KLOR-CON) 10 MEQ tablet Take 1 tablet (10 mEq total) by mouth daily. 30 tablet 0   No current facility-administered medications on file prior to visit.    Allergies  Allergen Reactions   Xarelto [Rivaroxaban] Swelling    Gums bled, headaches   Ketorolac Tromethamine Other (See Comments)    Unknown  reaction    Social History   Socioeconomic History   Marital status: Single    Spouse name: Not on file   Number of children: Not on file   Years of education: Not on file   Highest education level: Not on file  Occupational History   Not on file  Tobacco Use   Smoking status: Never   Smokeless tobacco: Current    Types: Snuff  Vaping Use   Vaping Use: Never used  Substance and Sexual Activity   Alcohol use: Yes    Alcohol/week:  2.0 - 3.0 standard drinks    Types: 2 - 3 Glasses of wine per week   Drug use: No    Types: Marijuana    Comment: Smokes marijuana weekly.  Has not used cocaine in 9 years.   Sexual activity: Yes    Birth control/protection: None  Other Topics Concern   Not on file  Social History Narrative   Lives in [place] by herself.  She had been caring for her mother but she died 2 mos ago.  She tries to remain active but does not regularly exercise.   Social Determinants of Health   Financial Resource Strain: Not on file  Food Insecurity: Not on file  Transportation Needs: Not on file  Physical Activity: Not on file  Stress: Not on file  Social Connections: Not on file  Intimate Partner Violence: Not on file    Family History  Problem Relation Age of Onset   Diabetes Father        deceased   Cancer Mother 64       colon   Stroke Mother    Hypertension Sister        alive and well   Hypertension Sister        alive and well   Heart attack Brother        deceased @ 64    Past Surgical History:  Procedure Laterality Date   CARDIOVERSION N/A 11/04/2013   Procedure: CARDIOVERSION;  Surgeon: Peter M Martinique, MD;  Location: [facility] ENDOSCOPY;  Service: Cardiovascular;  Laterality: N/A;   CESAREAN SECTION      ROS: Review of Systems Negative except as stated above  PHYSICAL EXAM: BP 123/77   Pulse 97   Resp 16   Wt 187 lb 12.8 oz (85.2 kg)   SpO2 96%   BMI 29.41 kg/m   Physical Exam  General appearance - alert, well appearing, middle-aged  African-American female and in no distress Mental status - normal mood, behavior, speech, dress, motor activity, and thought processes Neck - supple, no significant adenopathy Chest - clear to auscultation, no wheezes, rales or rhonchi, symmetric air entry Heart -irregularly irregular.  Soft systolic ejection murmur heard at the left upper sternal border. Extremities - peripheral pulses normal, no pedal edema, no clubbing or cyanosis Diabetic Foot Exam - Simple   Simple Foot Form Visual Inspection No deformities, no ulcerations, no other skin breakdown bilaterally: Yes Sensation Testing Intact to touch and monofilament testing bilaterally: Yes Pulse Check Posterior Tibialis and Dorsalis pulse intact bilaterally: Yes Comments      Depression screen [facility] 2/9 07/30/2021 12/21/2020 04/24/2019  Decreased Interest 0 0 0  Down, Depressed, Hopeless 2 0 0  PHQ - 2 Score 2 0 0  Altered sleeping 1 0 0  Tired, decreased energy 0 0 0  Change in appetite 0 0 0  Feeling bad or failure about yourself  0 0 0  Trouble concentrating 0 0 0  Moving slowly or fidgety/restless 0 0 0  Suicidal thoughts 0 0 0  PHQ-9 Score 3 0 0  Difficult doing work/chores - - Not difficult at all  Some recent data might be hidden    CMP Latest Ref Rng & Units 11/09/2020 04/13/2020 10/30/2019  Glucose 65 - 99 mg/dL 92 151(H) 124(H)  BUN 8 - 27 mg/dL $Remove'8 10 9  'mfnUbvL$ Creatinine 0.57 - 1.00 mg/dL 0.79 0.84 0.67  Sodium 134 - 144 mmol/L 143 141 146(H)  Potassium 3.5 - 5.2 mmol/L 4.5 4.0 4.1  Chloride 96 - 106 mmol/L 104 103 105  CO2 20 - 29 mmol/L $RemoveB'23 22 24  'SXzzkRyv$ Calcium 8.7 - 10.3 mg/dL 10.6(H) 10.7(H) 10.4(H)  Total Protein 6.0 - 8.5 g/dL - 7.7 7.8  Total Bilirubin 0.0 - 1.2 mg/dL - 0.3 0.3  Alkaline Phos 48 - 121 IU/L - 44(L) 48  AST 0 - 40 IU/L - 15 19  ALT 0 - 32 IU/L - 11 16   Lipid Panel     Component Value Date/Time   CHOL 140 04/13/2020 1101   TRIG 163 (H) 04/13/2020 1101   HDL 54 04/13/2020 1101   CHOLHDL 2.6  04/13/2020 1101   CHOLHDL 2.9 09/12/2016 0939   VLDL NOT CALC 09/12/2016 0939   LDLCALC 59 04/13/2020 1101    CBC    Component Value Date/Time   WBC 4.9 11/09/2020 1101   WBC 3.9 (L) 07/07/2018 1049   RBC 3.72 (L) 11/09/2020 1101   RBC 3.72 (L) 07/07/2018 1049   HGB 12.3 11/09/2020 1101   HCT 35.9 11/09/2020 1101   PLT 203 11/09/2020 1101   MCV 97 11/09/2020 1101   MCH 33.1 (H) 11/09/2020 1101   MCH 32.3 07/07/2018 1049   MCHC 34.3 11/09/2020 1101   MCHC 31.7 07/07/2018 1049   RDW 12.6 11/09/2020 1101   LYMPHSABS 1.8 06/27/2019 1126   MONOABS 246 08/18/2016 1114   EOSABS 0.1 06/27/2019 1126   BASOSABS 0.0 06/27/2019 1126    ASSESSMENT AND PLAN: 1. Type 2 diabetes mellitus with diabetic neuropathy, without long-term current use of insulin (HCC) Diabetes under good control with metformin.  Commended her on this.  Encouraged her to keep up the good work with healthy eating habits and regular exercise.  I recommend increasing gabapentin to 600 mg 3 times a day for neuropathy symptoms. - POCT glucose (manual entry) - POCT glycosylated hemoglobin (Hb A1C) - CBC - Comprehensive metabolic panel - Lipid panel - Ambulatory referral to Ophthalmology - gabapentin (NEURONTIN) 600 MG tablet; Take 1 tablet (600 mg total) by mouth 3 (three) times daily.  Dispense: 90 tablet; Refill: 6 - Accu-Chek FastClix Lancets MISC; USE AS DIRECTED  Dispense: 100 each; Refill: 11 - glucose blood (ACCU-CHEK AVIVA PLUS) test strip; Check BS 1-2 times a day  Dispense: 100 each; Refill: 11  2. Essential hypertension At goal on current medications and low-salt diet. - glucose blood (ACCU-CHEK AVIVA PLUS) test strip; Check BS 1-2 times a day  Dispense: 100 each; Refill: 11  3. Dyslipidemia Continue fenofibrate.  Ideally should be on a statin therapy - glucose blood (ACCU-CHEK AVIVA PLUS) test strip; Check BS 1-2 times a day  Dispense: 100 each; Refill: 11  4. PAF (paroxysmal atrial fibrillation)  (HCC) Rate controlled on carvedilol.  Tolerating Pradaxa.  5. Screening for STD (sexually transmitted disease) - HIV antibody (with reflex) - Cervicovaginal ancillary only - RPR  6. Need for immunization against influenza - Flu Vaccine QUAD 74mo+IM (Fluarix, Fluzone & Alfiuria Quad PF)  7. Need for vaccination against Streptococcus pneumoniae Given Prevnar 20 today.     Patient was given the opportunity to ask questions.  Patient verbalized understanding of the plan and was able to repeat key elements of the plan.   Orders Placed This Encounter  Procedures   Flu Vaccine QUAD 55mo+IM (Fluarix, Fluzone & Alfiuria Quad PF)   Pneumococcal conjugate vaccine 20-valent   CBC   Comprehensive metabolic panel   Lipid panel   HIV antibody (with reflex)   RPR   Ambulatory  referral to Ophthalmology   POCT glucose (manual entry)   POCT glycosylated hemoglobin (Hb A1C)     Requested Prescriptions   Signed Prescriptions Disp Refills   gabapentin (NEURONTIN) 600 MG tablet 90 tablet 6    Sig: Take 1 tablet (600 mg total) by mouth 3 (three) times daily.   Accu-Chek FastClix Lancets MISC 100 each 11    Sig: USE AS DIRECTED   glucose blood (ACCU-CHEK AVIVA PLUS) test strip 100 each 11    Sig: Check BS 1-2 times a day    Return in about 4 months (around 11/29/2021).  Karle Plumber, MD, FACP

## 2021-07-31 LAB — CBC
Hematocrit: 36.4 % (ref 34.0–46.6)
Hemoglobin: 12.7 g/dL (ref 11.1–15.9)
MCH: 32.6 pg (ref 26.6–33.0)
MCHC: 34.9 g/dL (ref 31.5–35.7)
MCV: 93 fL (ref 79–97)
Platelets: 208 10*3/uL (ref 150–450)
RBC: 3.9 x10E6/uL (ref 3.77–5.28)
RDW: 12 % (ref 11.7–15.4)
WBC: 5 10*3/uL (ref 3.4–10.8)

## 2021-07-31 LAB — COMPREHENSIVE METABOLIC PANEL
ALT: 16 IU/L (ref 0–32)
AST: 22 IU/L (ref 0–40)
Albumin/Globulin Ratio: 1.5 (ref 1.2–2.2)
Albumin: 4.8 g/dL (ref 3.8–4.8)
Alkaline Phosphatase: 49 IU/L (ref 44–121)
BUN/Creatinine Ratio: 10 — ABNORMAL LOW (ref 12–28)
BUN: 8 mg/dL (ref 8–27)
Bilirubin Total: 0.3 mg/dL (ref 0.0–1.2)
CO2: 23 mmol/L (ref 20–29)
Calcium: 10.3 mg/dL (ref 8.7–10.3)
Chloride: 101 mmol/L (ref 96–106)
Creatinine, Ser: 0.82 mg/dL (ref 0.57–1.00)
Globulin, Total: 3.2 g/dL (ref 1.5–4.5)
Glucose: 175 mg/dL — ABNORMAL HIGH (ref 65–99)
Potassium: 4.1 mmol/L (ref 3.5–5.2)
Sodium: 142 mmol/L (ref 134–144)
Total Protein: 8 g/dL (ref 6.0–8.5)
eGFR: 81 mL/min/{1.73_m2} (ref >59)

## 2021-07-31 LAB — LIPID PANEL
Chol/HDL Ratio: 2.6 ratio (ref 0.0–4.4)
Cholesterol, Total: 134 mg/dL (ref 100–199)
HDL: 52 mg/dL (ref >39)
LDL Chol Calc (NIH): 63 mg/dL (ref 0–99)
Triglycerides: 104 mg/dL (ref 0–149)
VLDL Cholesterol Cal: 19 mg/dL (ref 5–40)

## 2021-07-31 LAB — RPR: RPR Ser Ql: NONREACTIVE

## 2021-07-31 LAB — HIV ANTIBODY (ROUTINE TESTING W REFLEX): HIV Screen 4th Generation wRfx: NONREACTIVE

## 2021-08-02 DIAGNOSIS — Z5181 Encounter for therapeutic drug level monitoring: Secondary | ICD-10-CM | POA: Diagnosis not present

## 2021-08-02 LAB — CERVICOVAGINAL ANCILLARY ONLY
Chlamydia: NEGATIVE
Comment: NEGATIVE
Comment: NEGATIVE
Comment: NORMAL
Neisseria Gonorrhea: NEGATIVE
Trichomonas: NEGATIVE

## 2021-08-03 ENCOUNTER — Other Ambulatory Visit: Payer: Self-pay | Admitting: Nurse Practitioner

## 2021-08-03 ENCOUNTER — Other Ambulatory Visit: Payer: Self-pay | Admitting: Family Medicine

## 2021-08-03 DIAGNOSIS — I10 Essential (primary) hypertension: Secondary | ICD-10-CM

## 2021-08-03 DIAGNOSIS — E785 Hyperlipidemia, unspecified: Secondary | ICD-10-CM

## 2021-08-04 ENCOUNTER — Other Ambulatory Visit: Payer: Self-pay

## 2021-08-04 ENCOUNTER — Ambulatory Visit (HOSPITAL_COMMUNITY)
Admission: RE | Admit: 2021-08-04 | Discharge: 2021-08-04 | Disposition: A | Discharge status: Home / Self Care | Payer: Medicaid Other | Source: Ambulatory Visit | Attending: Nurse Practitioner | Admitting: Nurse Practitioner

## 2021-08-04 ENCOUNTER — Encounter (HOSPITAL_COMMUNITY): Payer: Self-pay | Admitting: Nurse Practitioner

## 2021-08-04 VITALS — BP 134/64 | HR 59 | Ht 67.0 in | Wt 191.0 lb

## 2021-08-04 DIAGNOSIS — Z7901 Long term (current) use of anticoagulants: Secondary | ICD-10-CM | POA: Insufficient documentation

## 2021-08-04 DIAGNOSIS — I10 Essential (primary) hypertension: Secondary | ICD-10-CM | POA: Insufficient documentation

## 2021-08-04 DIAGNOSIS — I350 Nonrheumatic aortic (valve) stenosis: Secondary | ICD-10-CM | POA: Diagnosis not present

## 2021-08-04 DIAGNOSIS — E1142 Type 2 diabetes mellitus with diabetic polyneuropathy: Secondary | ICD-10-CM | POA: Insufficient documentation

## 2021-08-04 DIAGNOSIS — Z8616 Personal history of COVID-19: Secondary | ICD-10-CM | POA: Diagnosis not present

## 2021-08-04 DIAGNOSIS — Z8249 Family history of ischemic heart disease and other diseases of the circulatory system: Secondary | ICD-10-CM | POA: Insufficient documentation

## 2021-08-04 DIAGNOSIS — D6869 Other thrombophilia: Secondary | ICD-10-CM

## 2021-08-04 DIAGNOSIS — Z7984 Long term (current) use of oral hypoglycemic drugs: Secondary | ICD-10-CM | POA: Insufficient documentation

## 2021-08-04 DIAGNOSIS — Z79899 Other long term (current) drug therapy: Secondary | ICD-10-CM | POA: Insufficient documentation

## 2021-08-04 DIAGNOSIS — Z888 Allergy status to other drugs, medicaments and biological substances status: Secondary | ICD-10-CM | POA: Diagnosis not present

## 2021-08-04 DIAGNOSIS — I480 Paroxysmal atrial fibrillation: Secondary | ICD-10-CM

## 2021-08-04 NOTE — Progress Notes (Addendum)
Patient ID: Jane Arellano, female   DOB: 1961/05/27, 60 y.o.   MRN: [id number]     Primary Care Physician: Ladell Pier, MD Referring Physician: Dr. Candy Sledge Kasa is a 60 y.o. female with a h/o PAF in the afib clinic for evaluation. Dx'ed 05/05/15, asymtpomatic, picked up on physical exam. Apparently very little afib burden since then.  She reports that she feels well other than dealing with peripheral neuropathy from her DM. She has not noticed any reoccurring afib. Continues on DOAC with a   CHA2DS2-VASc Score of at least 4.   Lifestyle habits reviewed and pt reports very little caffeine, no alcohol, walks daily. Denies snoring. Discussed avoiding decongestants going into cold/flu season.  F/u in afib clinic, 04/16/20. She states that she feels great. No issues with afib. Recently, saw PCP for physical with labs.   In afib clinic, 01/28/21. She reports that she feels good. No afib to report. No issues with pradaxa with a CHA2DS2VASc score of 3. Will repeat echo for h/o mild aortic  stenosis with mild to mod on echo in 2020. She did get covid form her daughter in February 2022,  but just had  cold symptoms. Her daughter, unvaccinated did not fare so well, was hospitalized with covid pneumonia, but is on the mend now.   F/u in afib clinic, 08/04/21. She reports that she is feeling well. No afib to report. Just had her physical and got a good report. NSR on EKG.   Today, she denies symptoms of palpitations, chest pain, shortness of breath, orthopnea, PND, lower extremity edema, dizziness, presyncope, syncope, or neurologic sequela. The patient is tolerating medications without difficulties and is otherwise without complaint today.   Past Medical History:  Diagnosis Date   Atypical chest pain    a. 11/2005 Negative Myoview   Diastolic dysfunction    DM2 (diabetes mellitus, type 2) (HCC)    Dysrhythmia    AFIB HX CV    GERD (gastroesophageal reflux disease)    Heart murmur     History of substance abuse (HCC)    HTN (hypertension)    Iron deficiency anemia    Paroxysmal atrial fibrillation (HCC) 05/01/2013   On Xarelto   Subaortic membrane    a. 01/2010 Echo: EF 55-60%, No rwma, subaortic membrane with elevated LVOT mean gradient of 21 mmHg, Triv AI, mod dil LA, mildly increased PASP. b. 05/02/2013 Echo:  LVEF 16-60%, grade 1 diastolic dysfunction, mild LVH, subaortic stenosis w/ turbulation in LVOT c/w subaortic membrane (mean gradient 42 mmHg/peak gradient 81 mmHg), mild biatrial enlargement   Past Surgical History:  Procedure Laterality Date   CARDIOVERSION N/A 11/04/2013   Procedure: CARDIOVERSION;  Surgeon: Peter M Martinique, MD;  Location: [facility];  Service: Cardiovascular;  Laterality: N/A;   CESAREAN SECTION      Current Outpatient Medications  Medication Sig Dispense Refill   Accu-Chek FastClix Lancets MISC USE AS DIRECTED 100 each 11   alum & mag hydroxide-simeth (MAALOX/MYLANTA) 630-160-10 MG/5ML suspension Take 15 mLs by mouth every 6 (six) hours as needed for indigestion or heartburn. 355 mL 5   amitriptyline (ELAVIL) 50 MG tablet TAKE 1 TABLET BY MOUTH EVERYDAY AT BEDTIME 90 tablet 0   amLODipine (NORVASC) 10 MG tablet TAKE 1 TABLET BY MOUTH EVERY DAY 90 tablet 1   Blood Glucose Calibration (ACCU-CHEK AVIVA) SOLN USE AS INSTRUCTED 1 each 0   Blood Glucose Monitoring Suppl (ACCU-CHEK AVIVA PLUS) w/Device KIT USE AS DIRECTED TWICE DAILY AT  8 AM AND 10 PM 1 kit 0   Calcium Carb-Cholecalciferol (CALCIUM-VITAMIN D3) 500-400 MG-UNIT TABS Take 1 tablet by mouth daily. 60 tablet 5   carvedilol (COREG) 3.125 MG tablet TAKE 1 TABLET BY MOUTH 2 TIMES DAILY. 60 tablet 0   cetirizine (ZYRTEC) 10 MG tablet TAKE 1 TABLET (10 MG TOTAL) BY MOUTH DAILY. AS NEEDED FOR NASAL CONGESTION 90 tablet 0   dabigatran (PRADAXA) 150 MG CAPS capsule TAKE 1 CAPSULE BY MOUTH TWICE A DAY 180 capsule 1   fenofibrate (TRICOR) 145 MG tablet TAKE 1 TABLET BY MOUTH EVERY DAY 90 tablet  1   ferrous sulfate 325 (65 FE) MG tablet One pill daily after a meal 90 tablet 3   furosemide (LASIX) 20 MG tablet TAKE 1 TABLET BY MOUTH EVERY DAY 30 tablet 0   gabapentin (NEURONTIN) 600 MG tablet Take 1 tablet (600 mg total) by mouth 3 (three) times daily. 90 tablet 6   glucose blood (ACCU-CHEK AVIVA PLUS) test strip Check BS 1-2 times a day 100 each 11   hydrOXYzine (ATARAX/VISTARIL) 25 MG tablet TAKE 1 TABLET BY MOUTH 3 TIMES DAILY AS NEEDED FOR ANXIETY. 60 tablet 5   metFORMIN (GLUCOPHAGE) 1000 MG tablet TAKE 1 TABLET BY MOUTH 2 TIMES DAILY WITH A MEAL. 180 tablet 3   Multiple Vitamin (MULTIVITAMIN WITH MINERALS) TABS tablet Take 1 tablet by mouth daily.     Omega-3 Fatty Acids (FISH OIL) 1000 MG CPDR Take 3 g by mouth daily. (Patient taking differently: Take 2 capsules by mouth daily.) 90 capsule 6   omeprazole (PRILOSEC) 40 MG capsule TAKE 1 CAPSULE (40 MG TOTAL) BY MOUTH DAILY. 90 capsule 1   polyethylene glycol powder (GAVILAX) 17 GM/SCOOP powder MIX 1 CAPFUL WITH LIQUID AND DRINK ONCE DAILY 476 g 6   No current facility-administered medications for this encounter.    Allergies  Allergen Reactions   Xarelto [Rivaroxaban] Swelling    Gums bled, headaches   Ketorolac Tromethamine Other (See Comments)    Unknown reaction    Social History   Socioeconomic History   Marital status: Single    Spouse name: Not on file   Number of children: Not on file   Years of education: Not on file   Highest education level: Not on file  Occupational History   Not on file  Tobacco Use   Smoking status: Never   Smokeless tobacco: Current    Types: Snuff  Vaping Use   Vaping Use: Never used  Substance and Sexual Activity   Alcohol use: Yes    Alcohol/week: 2.0 - 3.0 standard drinks    Types: 2 - 3 Glasses of wine per week   Drug use: No    Types: Marijuana    Comment: Smokes marijuana weekly.  Has not used cocaine in 9 years.   Sexual activity: Yes    Birth control/protection: None   Other Topics Concern   Not on file  Social History Narrative   Lives in [place] by herself.  She had been caring for her mother but she died 2 mos ago.  She tries to remain active but does not regularly exercise.   Social Determinants of Health   Financial Resource Strain: Not on file  Food Insecurity: Not on file  Transportation Needs: Not on file  Physical Activity: Not on file  Stress: Not on file  Social Connections: Not on file  Intimate Partner Violence: Not on file    Family History  Problem Relation Age  of Onset   Diabetes Father        deceased   Cancer Mother 84       colon   Stroke Mother    Hypertension Sister        alive and well   Hypertension Sister        alive and well   Heart attack Brother        deceased @ 23    ROS- All systems are reviewed and negative except as per the HPI above  Physical Exam: Vitals:   08/04/21 1028  BP: 134/64  Pulse: (!) 59  Weight: 86.6 kg  Height: $Remove'5\' 7"'bhgBTRf$  (1.702 m)    GEN- The patient is well appearing, alert and oriented x 3 today.   Head- normocephalic, atraumatic Eyes-  Sclera clear, conjunctiva pink Ears- hearing intact Oropharynx- clear Neck- supple, no JVP Lymph- no cervical lymphadenopathy Lungs- Clear to ausculation bilaterally, normal work of breathing Heart- Regular rate and rhythm, 2/6 systolic murmiur murmurs, rubs or gallops, PMI not laterally displaced GI- soft, NT, ND, + BS Extremities- no clubbing, cyanosis, or edema MS- no significant deformity or atrophy Skin- no rash or lesion Psych- euthymic mood, full affect Neuro- strength and sensation are intact  EKG-SR at 59 bpm, pr int 164 ms, qrs int 94  ms, qtc 437ms  Epic records reviewed  Echo- 02/26/21-  1. Left ventricular ejection fraction, by estimation, is 55 to 60%. The  left ventricle has normal function. The left ventricle has no regional  wall motion abnormalities. Left ventricular diastolic parameters were  normal.   2. Right ventricular  systolic function is low normal. The right  ventricular size is normal. There is normal pulmonary artery systolic  pressure.   3. Left atrial size was moderately dilated.   4. Right atrial size was mildly dilated.   5. The mitral valve is normal in structure. No evidence of mitral valve  regurgitation.   6. The aortic valve is tricuspid. Aortic valve regurgitation is mild to  moderate. Mild aortic valve stenosis. Aortic valve mean gradient measures  11.7 mmHg.   7. Aortic dilatation noted. There is borderline dilatation of the  ascending aorta, measuring 38 mm.   8. The inferior vena cava is normal in size with greater than 50%  respiratory variability, suggesting right atrial pressure of 3 mmHg.   Comparison(s): No significant change from prior study.   Assessment and Plan:  1. PAF No know burden  Continues in SR Continue carvedilol Continue pradaxa for chadsvasc of at least 4, no bleeding issues Recent creatinine, normal function     2. HTN Stable   3. Mild aortic stenosis with mild to mod regurgitation Echo stable 02/2021, mild aortic stenosis Repeat 1-2 years     F/u in afib clinic in 1 year  Butch Penny C. Ceil Roderick, [facility] [address] [place], [place] [address] [phone]

## 2021-08-17 DIAGNOSIS — Z5181 Encounter for therapeutic drug level monitoring: Secondary | ICD-10-CM | POA: Diagnosis not present

## 2021-08-26 DIAGNOSIS — Z5181 Encounter for therapeutic drug level monitoring: Secondary | ICD-10-CM | POA: Diagnosis not present

## 2021-09-01 ENCOUNTER — Other Ambulatory Visit: Payer: Self-pay | Admitting: Nurse Practitioner

## 2021-09-01 DIAGNOSIS — F418 Other specified anxiety disorders: Secondary | ICD-10-CM

## 2021-09-01 NOTE — Telephone Encounter (Signed)
Requested Prescriptions  Pending Prescriptions Disp Refills  . amitriptyline (ELAVIL) 50 MG tablet [Pharmacy Med Name: AMITRIPTYLINE HCL 50 MG TAB] 90 tablet 0    Sig: TAKE 1 TABLET BY MOUTH EVERYDAY AT BEDTIME     Psychiatry:  Antidepressants - Heterocyclics (TCAs) Passed - 09/01/2021  1:37 PM      Passed - Completed PHQ-2 or PHQ-9 in the last 360 days      Passed - Valid encounter within last 6 months    Recent Outpatient Visits          1 month ago Type 2 diabetes mellitus with diabetic neuropathy, without long-term current use of insulin (HCC)   [facility] [place], Gavin Pound B, MD   8 months ago Type 2 diabetes mellitus with complication, without long-term current use of insulin (HCC)   [facility] [place], Shea Stakes, NP   1 year ago Well female exam with routine gynecological exam   [facility] [facility] [place], [place], MD   1 year ago Diabetic polyneuropathy associated with type 2 diabetes mellitus (HCC)   [place] [place], [place], MD   2 years ago Type 2 diabetes mellitus with complication, without long-term current use of insulin (HCC)   [place] [facility] Fulp, [place], MD      Future Appointments            In 3 months Laural Benes, Binnie Rail, MD [facility]

## 2021-09-12 ENCOUNTER — Other Ambulatory Visit: Payer: Self-pay | Admitting: Internal Medicine

## 2021-09-12 DIAGNOSIS — I10 Essential (primary) hypertension: Secondary | ICD-10-CM

## 2021-09-13 NOTE — Telephone Encounter (Signed)
Requested Prescriptions  Pending Prescriptions Disp Refills  . carvedilol (COREG) 3.125 MG tablet [Pharmacy Med Name: CARVEDILOL 3.125 MG TABLET] 60 tablet 0    Sig: TAKE 1 TABLET BY MOUTH TWICE A DAY     Cardiovascular:  Beta Blockers Passed - 09/12/2021 10:30 AM      Passed - Last BP in normal range    BP Readings from Last 1 Encounters:  08/04/21 134/64         Passed - Last Heart Rate in normal range    Pulse Readings from Last 1 Encounters:  08/04/21 (!) 59         Passed - Valid encounter within last 6 months    Recent Outpatient Visits          1 month ago Type 2 diabetes mellitus with diabetic neuropathy, without long-term current use of insulin (HCC)   [facility] [facility] [place], Gavin Pound B, MD   8 months ago Type 2 diabetes mellitus with complication, without long-term current use of insulin (HCC)   [place] [place], Shea Stakes, NP   1 year ago Well female exam with routine gynecological exam   [place] [facility] Fulp, [place], MD   1 year ago Diabetic polyneuropathy associated with type 2 diabetes mellitus (HCC)   [facility] Fulp, [place], MD   2 years ago Type 2 diabetes mellitus with complication, without long-term current use of insulin (HCC)    [facility] [place], [place], MD      Future Appointments            In 2 months Laural Benes, Binnie Rail, MD [facility]

## 2021-09-19 ENCOUNTER — Other Ambulatory Visit: Payer: Self-pay | Admitting: Internal Medicine

## 2021-09-19 DIAGNOSIS — I10 Essential (primary) hypertension: Secondary | ICD-10-CM

## 2021-09-19 NOTE — Telephone Encounter (Signed)
Requested medication (s) are due for refill today: yes  Requested medication (s) are on the active medication list: yes  Last refill:  07/20/21 #30  Future visit scheduled: yes  Notes to clinic:  Must keep upcoming office visit  for refills.   Requested Prescriptions  Pending Prescriptions Disp Refills   furosemide (LASIX) 20 MG tablet [Pharmacy Med Name: FUROSEMIDE 20 MG TABLET] 30 tablet 0    Sig: TAKE 1 TABLET BY MOUTH EVERY DAY     Cardiovascular:  Diuretics - Loop Passed - 09/19/2021 12:32 PM      Passed - K in normal range and within 360 days    Potassium  Date Value Ref Range Status  07/30/2021 4.1 3.5 - 5.2 mmol/L Final          Passed - Ca in normal range and within 360 days    Calcium  Date Value Ref Range Status  07/30/2021 10.3 8.7 - 10.3 mg/dL Final   Calcium, Ion  Date Value Ref Range Status  03/18/2014 1.22 1.12 - 1.23 mmol/L Final          Passed - Na in normal range and within 360 days    Sodium  Date Value Ref Range Status  07/30/2021 142 134 - 144 mmol/L Final          Passed - Cr in normal range and within 360 days    Creat  Date Value Ref Range Status  08/18/2016 0.64 0.50 - 1.05 mg/dL Final    Comment:      For patients > or = 61 years of age: The upper reference limit for Creatinine is approximately 13% higher for people identified as African-American.      Creatinine, Ser  Date Value Ref Range Status  07/30/2021 0.82 0.57 - 1.00 mg/dL Final   Creatinine, POC  Date Value Ref Range Status  04/26/2017 50 mg/dL Final   Creatinine, Urine  Date Value Ref Range Status  12/10/2014 38.8 mg/dL Final    Comment:    No reference range established.          Passed - Last BP in normal range    BP Readings from Last 1 Encounters:  08/04/21 134/64          Passed - Valid encounter within last 6 months    Recent Outpatient Visits           1 month ago Type 2 diabetes mellitus with diabetic neuropathy, without long-term current use  of insulin (HCC)   [facility] [facility] [place], Gavin Pound B, MD   9 months ago Type 2 diabetes mellitus with complication, without long-term current use of insulin (HCC)   [place] [facility] [place], Shea Stakes, NP   1 year ago Well female exam with routine gynecological exam   [place] [facility] Fulp, [place], MD   1 year ago Diabetic polyneuropathy associated with type 2 diabetes mellitus (HCC)   [place] [facility] F[place], [place], MD   2 years ago Type 2 diabetes mellitus with complication, without long-term current use of insulin (HCC)    [facility] [place], [place], MD       Future Appointments             In 2 months Laural Benes, Binnie Rail, MD [facility]

## 2021-10-06 ENCOUNTER — Other Ambulatory Visit: Payer: Self-pay | Admitting: Internal Medicine

## 2021-10-06 DIAGNOSIS — I10 Essential (primary) hypertension: Secondary | ICD-10-CM

## 2021-10-06 NOTE — Telephone Encounter (Signed)
Requested medication (s) are due for refill today:   Yes  Requested medication (s) are on the active medication list:   Yes  Future visit scheduled:   Yes   Last ordered: 09/13/2021 #60, 0 refills  Returned because got a very high warning from pharmacy pertaining to interaction between Pradaxa and Coreg.   Requested Prescriptions  Pending Prescriptions Disp Refills   carvedilol (COREG) 3.125 MG tablet [Pharmacy Med Name: CARVEDILOL 3.125 MG TABLET] 180 tablet 0    Sig: TAKE 1 TABLET BY MOUTH TWICE A DAY     Cardiovascular:  Beta Blockers Passed - 10/06/2021  9:30 AM      Passed - Last BP in normal range    BP Readings from Last 1 Encounters:  08/04/21 134/64          Passed - Last Heart Rate in normal range    Pulse Readings from Last 1 Encounters:  08/04/21 (!) 59          Passed - Valid encounter within last 6 months    Recent Outpatient Visits           2 months ago Type 2 diabetes mellitus with diabetic neuropathy, without long-term current use of insulin (HCC)   [place] [facility] [place], Gavin Pound B, MD   9 months ago Type 2 diabetes mellitus with complication, without long-term current use of insulin (HCC)   [place] [facility] [place], Shea Stakes, NP   1 year ago Well female exam with routine gynecological exam   [place] [facility] Fulp, [place], MD   1 year ago Diabetic polyneuropathy associated with type 2 diabetes mellitus (HCC)   [place] [facility] Fulp, [place], MD   2 years ago Type 2 diabetes mellitus with complication, without long-term current use of insulin (HCC)    [facility] [place], [place], MD       Future Appointments             In 1 month Laural Benes, Binnie Rail, MD [facility]

## 2021-10-07 DIAGNOSIS — Z5181 Encounter for therapeutic drug level monitoring: Secondary | ICD-10-CM | POA: Diagnosis not present

## 2021-10-21 DIAGNOSIS — Z5181 Encounter for therapeutic drug level monitoring: Secondary | ICD-10-CM | POA: Diagnosis not present

## 2021-10-26 ENCOUNTER — Other Ambulatory Visit: Payer: Self-pay | Admitting: Family Medicine

## 2021-10-26 DIAGNOSIS — Z1231 Encounter for screening mammogram for malignant neoplasm of breast: Secondary | ICD-10-CM

## 2021-10-29 ENCOUNTER — Other Ambulatory Visit: Payer: Self-pay | Admitting: Internal Medicine

## 2021-10-29 ENCOUNTER — Other Ambulatory Visit: Payer: Self-pay | Admitting: Nurse Practitioner

## 2021-10-29 DIAGNOSIS — K219 Gastro-esophageal reflux disease without esophagitis: Secondary | ICD-10-CM

## 2021-10-29 DIAGNOSIS — I10 Essential (primary) hypertension: Secondary | ICD-10-CM

## 2021-11-07 ENCOUNTER — Other Ambulatory Visit: Payer: Self-pay | Admitting: Internal Medicine

## 2021-11-07 DIAGNOSIS — K219 Gastro-esophageal reflux disease without esophagitis: Secondary | ICD-10-CM

## 2021-11-07 DIAGNOSIS — I10 Essential (primary) hypertension: Secondary | ICD-10-CM

## 2021-11-08 ENCOUNTER — Emergency Department (HOSPITAL_COMMUNITY)
Admission: EM | Admit: 2021-11-08 | Discharge: 2021-11-08 | Disposition: A | Discharge status: Home / Self Care | Payer: Medicaid Other | Attending: Emergency Medicine | Admitting: Emergency Medicine

## 2021-11-08 ENCOUNTER — Encounter (HOSPITAL_COMMUNITY): Payer: Self-pay | Admitting: *Deleted

## 2021-11-08 ENCOUNTER — Other Ambulatory Visit: Payer: Self-pay

## 2021-11-08 DIAGNOSIS — Z20822 Contact with and (suspected) exposure to covid-19: Secondary | ICD-10-CM | POA: Diagnosis not present

## 2021-11-08 DIAGNOSIS — Z7984 Long term (current) use of oral hypoglycemic drugs: Secondary | ICD-10-CM | POA: Diagnosis not present

## 2021-11-08 DIAGNOSIS — L299 Pruritus, unspecified: Secondary | ICD-10-CM | POA: Diagnosis not present

## 2021-11-08 DIAGNOSIS — I10 Essential (primary) hypertension: Secondary | ICD-10-CM | POA: Insufficient documentation

## 2021-11-08 DIAGNOSIS — E1142 Type 2 diabetes mellitus with diabetic polyneuropathy: Secondary | ICD-10-CM | POA: Diagnosis not present

## 2021-11-08 DIAGNOSIS — Z79899 Other long term (current) drug therapy: Secondary | ICD-10-CM | POA: Diagnosis not present

## 2021-11-08 DIAGNOSIS — M25471 Effusion, right ankle: Secondary | ICD-10-CM | POA: Diagnosis present

## 2021-11-08 LAB — RESP PANEL BY RT-PCR (FLU A&B, COVID) ARPGX2
Influenza A by PCR: NEGATIVE
Influenza B by PCR: NEGATIVE
SARS Coronavirus 2 by RT PCR: NEGATIVE

## 2021-11-08 MED ORDER — PREDNISONE 20 MG PO TABS
60.0000 mg | ORAL_TABLET | Freq: Once | ORAL | Status: AC
  Administered 2021-11-08: 08:00:00 60 mg via ORAL
  Filled 2021-11-08: qty 3

## 2021-11-08 MED ORDER — NAPROXEN 375 MG PO TABS: 375.0000 mg | ORAL_TABLET | Freq: Two times a day (BID) | ORAL | 0 refills | Status: DC

## 2021-11-08 MED ORDER — FAMOTIDINE 20 MG PO TABS
20.0000 mg | ORAL_TABLET | Freq: Once | ORAL | Status: AC
  Administered 2021-11-08: 08:00:00 20 mg via ORAL
  Filled 2021-11-08: qty 1

## 2021-11-08 MED ORDER — DIPHENHYDRAMINE HCL 25 MG PO CAPS
50.0000 mg | ORAL_CAPSULE | Freq: Once | ORAL | Status: AC
  Administered 2021-11-08: 08:00:00 50 mg via ORAL
  Filled 2021-11-08: qty 2

## 2021-11-08 NOTE — ED Provider Notes (Signed)
Emergency Medicine Provider Triage Evaluation Note  Jane Arellano , a 60 y.o. female  was evaluated in triage.  Pt complains of diffuse back, abdomen, chest itching for the past 1 to 2 days.  She also complains of pain to these areas.  She reports that she sprained her left ankle about a week ago and started taking ibuprofen gel capsules that she bought at [employer].  She states that she took this for a couple of days before she noticed the itching however she is convinced that she is having an allergic reaction to the ibuprofen.  She states that she has taken ibuprofen in the past however has never taken a [facility] brand ibuprofen.  He has not tried anything at home for the itching including Benadryl.  Denies any throat swelling, wheezing, shortness of breath. She does mention that she is having a headache which is atypical for her however denies any fevers, chills, cough, body aches specifically..  Review of Systems  Positive: + itching, pain, headache Negative: - cough, fever, body aches, vision changes  Physical Exam  BP (!) 141/88 (BP Location: Left Arm)    Pulse 62    Temp 97.9 F (36.6 C) (Oral)    Resp 18    SpO2 100%  Gen:   Awake, no distress   Resp:  Normal effort  MSK:   Moves extremities without difficulty  Other:  No rash appreciated. Itching crossing midline  Medical Decision Making  Medically screening exam initiated at 8:07 AM.  Appropriate orders placed.  Jane Arellano was informed that the remainder of the evaluation will be completed by another provider, this initial triage assessment does not replace that evaluation, and the importance of remaining in the ED until their evaluation is complete.     Tanda Rockers, PA-C 11/08/21 [id number]    [name], MD 11/08/21 [phone]

## 2021-11-08 NOTE — Telephone Encounter (Signed)
Requested Prescriptions  Refused Prescriptions Disp Refills   ferrous sulfate 325 (65 FE) MG tablet [Pharmacy Med Name: FERROUS SULFATE 325 MG TABLET] 90 tablet 0    Sig: TAKE 1 TABLET BY MOUTH EVERY DAY AFTER A MEAL     Endocrinology:  Minerals - Iron Supplementation Failed - 11/07/2021 10:59 AM      Failed - Fe (serum) in normal range and within 360 days    Iron  Date Value Ref Range Status  08/03/2009 149 (H) 42 - 145 mcg/dL Final   Saturation Ratios  Date Value Ref Range Status  08/03/2009 44 20 - 55 % Final         Failed - Ferritin in normal range and within 360 days    No results found for: FERRITIN       Passed - HGB in normal range and within 360 days    Hemoglobin  Date Value Ref Range Status  07/30/2021 12.7 11.1 - 15.9 g/dL Final         Passed - HCT in normal range and within 360 days    Hematocrit  Date Value Ref Range Status  07/30/2021 36.4 34.0 - 46.6 % Final         Passed - RBC in normal range and within 360 days    RBC  Date Value Ref Range Status  07/30/2021 3.90 3.77 - 5.28 x10E6/uL Final  07/07/2018 3.72 (L) 3.87 - 5.11 MIL/uL Final         Passed - Valid encounter within last 12 months    Recent Outpatient Visits          3 months ago Type 2 diabetes mellitus with diabetic neuropathy, without long-term current use of insulin (HCC)   [place] [place], Gavin Pound B, MD   10 months ago Type 2 diabetes mellitus with complication, without long-term current use of insulin (HCC)   [place] [facility] [place], Shea Stakes, NP   1 year ago Well female exam with routine gynecological exam   [place] [facility] Fulp, [place], MD   2 years ago Diabetic polyneuropathy associated with type 2 diabetes mellitus (HCC)   [place] [facility] [place], [place], MD   2 years ago Type 2 diabetes mellitus with complication, without long-term current use of insulin (HCC)   [facility] [place], [place], MD      Future Appointments            In 3 weeks Marcine Matar, MD [place] [facility]            furosemide (LASIX) 20 MG tablet [Pharmacy Med Name: FUROSEMIDE 20 MG TABLET] 90 tablet 0    Sig: TAKE 1 TABLET BY MOUTH EVERY DAY     Cardiovascular:  Diuretics - Loop Passed - 11/07/2021 10:59 AM      Passed - K in normal range and within 360 days    Potassium  Date Value Ref Range Status  07/30/2021 4.1 3.5 - 5.2 mmol/L Final         Passed - Ca in normal range and within 360 days    Calcium  Date Value Ref Range Status  07/30/2021 10.3 8.7 - 10.3 mg/dL Final   Calcium, Ion  Date Value Ref Range Status  03/18/2014 1.22 1.12 - 1.23 mmol/L Final         Passed - Na in normal  range and within 360 days    Sodium  Date Value Ref Range Status  07/30/2021 142 134 - 144 mmol/L Final         Passed - Cr in normal range and within 360 days    Creat  Date Value Ref Range Status  08/18/2016 0.64 0.50 - 1.05 mg/dL Final    Comment:      For patients > or = 61 years of age: The upper reference limit for Creatinine is approximately 13% higher for people identified as African-American.      Creatinine, Ser  Date Value Ref Range Status  07/30/2021 0.82 0.57 - 1.00 mg/dL Final   Creatinine, POC  Date Value Ref Range Status  04/26/2017 50 mg/dL Final   Creatinine, Urine  Date Value Ref Range Status  12/10/2014 38.8 mg/dL Final    Comment:    No reference range established.         Passed - Last BP in normal range    BP Readings from Last 1 Encounters:  11/08/21 130/73         Passed - Valid encounter within last 6 months    Recent Outpatient Visits          3 months ago Type 2 diabetes mellitus with diabetic neuropathy, without long-term current use of insulin ([place])   [place], [name] B, MD   10 months ago Type 2 diabetes mellitus with  complication, without long-term current use of insulin ([place])   [place], Vernia Buff, NP   1 year ago Well female exam with routine gynecological exam   [place], [place], MD   2 years ago Diabetic polyneuropathy associated with type 2 diabetes mellitus ([place])   [place], [place], MD   2 years ago Type 2 diabetes mellitus with complication, without long-term current use of insulin ([place])   [place], MD      Future Appointments            In 3 weeks Wynetta Emery, Dalbert Batman, MD [place]

## 2021-11-08 NOTE — ED Provider Notes (Signed)
[facility] EMERGENCY DEPARTMENT Provider Note   CSN: [id number] Arrival date & time: 11/08/21  7672     History Chief Complaint  Patient presents with   Pruritis    Jane Arellano is a 60 y.o. female.  HPI  60 year old female presenting to the emergency department with a chief complaint of itching.  She states that she twisted her ankle over a week ago and had some swelling in her ankle but has been overall able to bear weight.  She took too much ibuprofen a few days ago (10 200 mg tablets) but since then has been taking just 1 tablet of ibuprofen daily.  She endorses itching and burning on her back and chest but denies any urticaria.  She denies any wheezing, cough or shortness of breath, nausea or vomiting.  She denies any known allergies.  Past Medical History:  Diagnosis Date   Atypical chest pain    a. 11/2005 Negative Myoview   Diastolic dysfunction    DM2 (diabetes mellitus, type 2) (HCC)    Dysrhythmia    AFIB HX CV    GERD (gastroesophageal reflux disease)    Heart murmur    History of substance abuse (HCC)    HTN (hypertension)    Iron deficiency anemia    Paroxysmal atrial fibrillation (HCC) 05/01/2013   On Xarelto   Subaortic membrane    a. 01/2010 Echo: EF 55-60%, No rwma, subaortic membrane with elevated LVOT mean gradient of 21 mmHg, Triv AI, mod dil LA, mildly increased PASP. b. 05/02/2013 Echo:  LVEF 09-47%, grade 1 diastolic dysfunction, mild LVH, subaortic stenosis w/ turbulation in LVOT c/w subaortic membrane (mean gradient 42 mmHg/peak gradient 81 mmHg), mild biatrial enlargement    Patient Active Problem List   Diagnosis Date Noted   Dyslipidemia 10/25/2017   Pap smear for cervical cancer screening 08/18/2016   Diabetic polyneuropathy associated with type 2 diabetes mellitus ([place]) 01/14/2016   Diabetic eye exam ([place]) 01/14/2016   Screening for colorectal cancer 01/14/2016   Neck pain 06/29/2015   Type 2 diabetes mellitus with  complication, without long-term current use of insulin ([place]) 03/16/2015   Essential hypertension 03/16/2015   BV (bacterial vaginosis) 12/11/2014   Screening for colon cancer 12/10/2014   Keloid scar 12/10/2014   Screening for STD (sexually transmitted disease) 12/10/2014   Onychomycosis of toenail 12/10/2014   Cerumen impaction 12/10/2014   Diabetes mellitus type 2 with neurological manifestations ([place]) 08/14/2014   Blurry vision, bilateral 08/14/2014   PAF (paroxysmal atrial fibrillation) ([place]) 05/08/2013   ETOH abuse 05/02/2013   Marijuana abuse 09/62/8366   Diastolic dysfunction 29/47/6546   Depression with anxiety 05/01/2013   Subaortic membrane    HTN (hypertension)    DEPENDENCE, COCAINE, 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     Family History  Problem Relation Age of Onset   Diabetes Father        deceased   Cancer Mother 98       colon   Stroke Mother    Hypertension Sister        alive and well   Hypertension Sister        alive and well  Heart attack Brother        deceased @ 77    Social History   Tobacco Use   Smoking status: Never   Smokeless tobacco: Current    Types: Snuff  Vaping Use   Vaping Use: Never used  Substance Use Topics   Alcohol use: Yes    Alcohol/week: 2.0 - 3.0 standard drinks    Types: 2 - 3 Glasses of wine per week   Drug use: No    Types: Marijuana    Comment: Smokes marijuana weekly.  Has not used cocaine in 9 years.    Home Medications Prior to Admission medications   Medication Sig Start Date End Date Taking? Authorizing Provider  carvedilol (COREG) 3.125 MG tablet TAKE 1 TABLET BY MOUTH TWICE A DAY 10/07/21   Ladell Pier, MD   furosemide (LASIX) 20 MG tablet TAKE 1 TABLET BY MOUTH EVERY DAY 09/20/21   Ladell Pier, MD  naproxen (NAPROSYN) 375 MG tablet Take 1 tablet (375 mg total) by mouth 2 (two) times daily. 11/08/21  Yes Regan Lemming, MD  Accu-Chek FastClix Lancets MISC USE AS DIRECTED 07/30/21   Ladell Pier, MD  alum & mag hydroxide-simeth (MAALOX/MYLANTA) 200-200-20 MG/5ML suspension Take 15 mLs by mouth every 6 (six) hours as needed for indigestion or heartburn. 10/30/19   Fulp, Cammie, MD  amitriptyline (ELAVIL) 50 MG tablet TAKE 1 TABLET BY MOUTH EVERYDAY AT BEDTIME 09/01/21   Ladell Pier, MD  amLODipine (NORVASC) 10 MG tablet TAKE 1 TABLET BY MOUTH EVERY DAY 08/03/21   Ladell Pier, MD  Blood Glucose Calibration (ACCU-CHEK AVIVA) SOLN USE AS INSTRUCTED 01/11/16   Tresa Garter, MD  Blood Glucose Monitoring Suppl (ACCU-CHEK AVIVA PLUS) w/Device KIT USE AS DIRECTED TWICE DAILY AT 8 AM AND 10 PM 01/04/16   Jegede, Marlena Clipper, MD  Calcium Carb-Cholecalciferol (CALCIUM-VITAMIN D3) 500-400 MG-UNIT TABS Take 1 tablet by mouth daily. 04/11/18   Argentina Donovan, PA-C  cetirizine (ZYRTEC) 10 MG tablet TAKE 1 TABLET (10 MG TOTAL) BY MOUTH DAILY. AS NEEDED FOR NASAL CONGESTION 09/19/20   Fulp, Cammie, MD  dabigatran (PRADAXA) 150 MG CAPS capsule TAKE 1 CAPSULE BY MOUTH TWICE A DAY 08/03/21   Charlott Rakes, MD  fenofibrate (TRICOR) 145 MG tablet TAKE 1 TABLET BY MOUTH EVERY DAY 08/03/21   Charlott Rakes, MD  ferrous sulfate 325 (65 FE) MG tablet TAKE 1 TABLET BY MOUTH EVERY DAY AFTER A MEAL 10/29/21   Ladell Pier, MD  gabapentin (NEURONTIN) 600 MG tablet Take 1 tablet (600 mg total) by mouth 3 (three) times daily. 07/30/21   Ladell Pier, MD  glucose blood (ACCU-CHEK AVIVA PLUS) test strip Check BS 1-2 times a day 07/30/21   Ladell Pier, MD  hydrOXYzine (ATARAX/VISTARIL) 25 MG tablet TAKE 1 TABLET BY MOUTH 3 TIMES DAILY AS NEEDED FOR ANXIETY. 12/21/20   Gildardo Pounds, NP   metFORMIN (GLUCOPHAGE) 1000 MG tablet TAKE 1 TABLET BY MOUTH 2 TIMES DAILY WITH A MEAL. 12/21/20   Gildardo Pounds, NP  Multiple Vitamin (MULTIVITAMIN WITH MINERALS) TABS tablet Take 1 tablet by mouth daily.    [provider]  Omega-3 Fatty Acids (FISH OIL) 1000 MG CPDR Take 3 g by mouth daily. Patient taking differently: Take 2 capsules by mouth daily. 04/11/18   Argentina Donovan, PA-C  omeprazole (PRILOSEC) 40 MG capsule TAKE 1 CAPSULE (40 MG TOTAL) BY MOUTH DAILY. 08/03/21   Ladell Pier, MD  polyethylene glycol powder (GAVILAX) 17 GM/SCOOP powder MIX 1 CAPFUL WITH LIQUID AND DRINK ONCE DAILY 10/30/19   Fulp, Cammie, MD  potassium chloride (K-DUR,KLOR-CON) 10 MEQ tablet Take 1 tablet (10 mEq total) by mouth daily. 11/06/13 04/24/19  Tresa Garter, MD    Allergies    Xarelto [rivaroxaban] and Ketorolac tromethamine  Review of Systems   Review of Systems  Musculoskeletal:  Positive for arthralgias and joint swelling.  Skin:  Negative for rash.  All other systems reviewed and are negative.  Physical Exam Updated Vital Signs BP 130/73    Pulse (!) 57    Temp 99.3 F (37.4 C)    Resp 16    SpO2 100%   Physical Exam Vitals and nursing note reviewed.  Constitutional:      General: She is not in acute distress.    Appearance: She is well-developed.  HENT:     Head: Normocephalic and atraumatic.  Eyes:     Conjunctiva/sclera: Conjunctivae normal.  Cardiovascular:     Rate and Rhythm: Normal rate and regular rhythm.     Heart sounds: No murmur heard. Pulmonary:     Effort: Pulmonary effort is normal. No respiratory distress.     Breath sounds: Normal breath sounds.  Abdominal:     Palpations: Abdomen is soft.     Tenderness: There is no abdominal tenderness.  Musculoskeletal:        General: No swelling.     Cervical back: Neck supple.     Comments: Left ankle with mild swelling, no tenderness about the medial or lateral malleolus, patient able to bear weight  with intact range of motion, 2+ DP pulses.  Skin:    General: Skin is warm and dry.     Capillary Refill: Capillary refill takes less than 2 seconds.     Findings: No rash.     Comments: No erythema or urticaria noted  Neurological:     Mental Status: She is alert.  Psychiatric:        Mood and Affect: Mood normal.    ED Results / Procedures / Treatments   Labs (all labs ordered are listed, but only abnormal results are displayed) Labs Reviewed  RESP PANEL BY RT-PCR (FLU A&B, COVID) [id number]    EKG None  Radiology No results found.  Procedures Procedures   Medications Ordered in ED Medications  predniSONE (DELTASONE) tablet 60 mg (60 mg Oral Given 11/08/21 0818)  diphenhydrAMINE (BENADRYL) capsule 50 mg (50 mg Oral Given 11/08/21 0819)  famotidine (PEPCID) tablet 20 mg (20 mg Oral Given 11/08/21 6283)    ED Course  I have reviewed the triage vital signs and the nursing notes.  Pertinent labs & imaging results that were available during my care of the patient were reviewed by me and considered in my medical decision making (see chart for details).    MDM Rules/Calculators/A&P                          60 year old female presenting to the emergency department with a chief complaint of itching.  She states that she twisted her ankle over a week ago and had some swelling in her ankle but has been overall able to bear weight.  She took too much ibuprofen a few days ago (10 200 mg tablets) but since then has been taking just 1 tablet of ibuprofen daily.  She endorses itching and burning on her back and chest but denies any  urticaria.  She denies any wheezing, cough or shortness of breath, nausea or vomiting.  She denies any known allergies.  On arrival, no concern for anaphylaxis at this time as the patient has no symptoms.  She was administered Pepcid, Benadryl and prednisone in the ED with subsequent resolution in her itching.  She may have an NSAID allergy but it is unclear at  this time.  We will switch her to naproxen and have her try that for her ankle pain and swelling.  No evidence for fracture at this time with no need for x-ray imaging of the ankle given reassuring physical exam findings.  Patient symptoms completely resolved following the above interventions, advised outpatient PCP follow-up.   Final Clinical Impression(s) / ED Diagnoses Final diagnoses:  Itching  Pruritus    Rx / DC Orders ED Discharge Orders          Ordered    naproxen (NAPROSYN) 375 MG tablet  2 times daily        11/08/21 1057             Regan Lemming, MD 11/09/21 [phone]

## 2021-11-08 NOTE — Discharge Instructions (Signed)
You were evaluated in the Emergency Department and after careful evaluation, we did not find any emergent condition requiring admission or further testing in the hospital.  Your exam/testing today was overall reassuring. Recommend taking additional Benadryl for any further itching.   Please return to the Emergency Department if you experience any worsening of your condition.  Thank you for allowing [place] to be a part of your care.

## 2021-11-08 NOTE — ED Triage Notes (Signed)
Pt reports day before yesterday she took ibuprofen, since then she has been having itching and burning on her back and chest, denies rash

## 2021-11-08 NOTE — Telephone Encounter (Signed)
[facility] called and spoke to Doug,Tech about the refill(s) Ferrous Sulfate requested. Advised it was sent on 10/29/21 #90/0 refill(s). He states med has already been filled and picked up. Will refuse this request.   Requested Prescriptions  Pending Prescriptions Disp Refills   ferrous sulfate 325 (65 FE) MG tablet [Pharmacy Med Name: FERROUS SULFATE 325 MG TABLET] 90 tablet 0    Sig: TAKE 1 TABLET BY MOUTH EVERY DAY AFTER A MEAL     Endocrinology:  Minerals - Iron Supplementation Failed - 11/07/2021 10:59 AM      Failed - Fe (serum) in normal range and within 360 days    Iron  Date Value Ref Range Status  08/03/2009 149 (H) 42 - 145 mcg/dL Final   Saturation Ratios  Date Value Ref Range Status  08/03/2009 44 20 - 55 % Final          Failed - Ferritin in normal range and within 360 days    No results found for: FERRITIN        Passed - HGB in normal range and within 360 days    Hemoglobin  Date Value Ref Range Status  07/30/2021 12.7 11.1 - 15.9 g/dL Final          Passed - HCT in normal range and within 360 days    Hematocrit  Date Value Ref Range Status  07/30/2021 36.4 34.0 - 46.6 % Final          Passed - RBC in normal range and within 360 days    RBC  Date Value Ref Range Status  07/30/2021 3.90 3.77 - 5.28 x10E6/uL Final  07/07/2018 3.72 (L) 3.87 - 5.11 MIL/uL Final          Passed - Valid encounter within last 12 months    Recent Outpatient Visits           3 months ago Type 2 diabetes mellitus with diabetic neuropathy, without long-term current use of insulin (HCC)   [facility] [place], Gavin Pound B, MD   10 months ago Type 2 diabetes mellitus with complication, without long-term current use of insulin (HCC)   [facility] [place], Shea Stakes, NP   1 year ago Well female exam with routine gynecological exam   [facility] [place], [place], MD   2 years ago Diabetic  polyneuropathy associated with type 2 diabetes mellitus (HCC)   [facility] Fulp, [place], MD   2 years ago Type 2 diabetes mellitus with complication, without long-term current use of insulin (HCC)   [facility] [place], [place], MD       Future Appointments             In 3 weeks Marcine Matar, MD  [facility]             furosemide (LASIX) 20 MG tablet [Pharmacy Med Name: FUROSEMIDE 20 MG TABLET] 90 tablet 0    Sig: TAKE 1 TABLET BY MOUTH EVERY DAY     Cardiovascular:  Diuretics - Loop Passed - 11/07/2021 10:59 AM      Passed - K in normal range and within 360 days    Potassium  Date Value Ref Range Status  07/30/2021 4.1 3.5 - 5.2 mmol/L Final          Passed - Ca in normal range and within 360 days  Calcium  Date Value Ref Range Status  07/30/2021 10.3 8.7 - 10.3 mg/dL Final   Calcium, Ion  Date Value Ref Range Status  03/18/2014 1.22 1.12 - 1.23 mmol/L Final          Passed - Na in normal range and within 360 days    Sodium  Date Value Ref Range Status  07/30/2021 142 134 - 144 mmol/L Final          Passed - Cr in normal range and within 360 days    Creat  Date Value Ref Range Status  08/18/2016 0.64 0.50 - 1.05 mg/dL Final    Comment:      For patients > or = 61 years of age: The upper reference limit for Creatinine is approximately 13% higher for people identified as African-American.      Creatinine, Ser  Date Value Ref Range Status  07/30/2021 0.82 0.57 - 1.00 mg/dL Final   Creatinine, POC  Date Value Ref Range Status  04/26/2017 50 mg/dL Final   Creatinine, Urine  Date Value Ref Range Status  12/10/2014 38.8 mg/dL Final    Comment:    No reference range established.          Passed - Last BP in normal range    BP Readings from Last 1 Encounters:  11/08/21 130/73          Passed - Valid encounter within last 6 months    Recent  Outpatient Visits           3 months ago Type 2 diabetes mellitus with diabetic neuropathy, without long-term current use of insulin (HCC)   [facility] [place], Gavin Pound B, MD   10 months ago Type 2 diabetes mellitus with complication, without long-term current use of insulin (HCC)   [facility] [place], Shea Stakes, NP   1 year ago Well female exam with routine gynecological exam   [facility] Fulp, [place], MD   2 years ago Diabetic polyneuropathy associated with type 2 diabetes mellitus (HCC)   [facility] Fulp, [place], MD   2 years ago Type 2 diabetes mellitus with complication, without long-term current use of insulin (HCC)    [facility] Fulp, [place], MD       Future Appointments             In 3 weeks Laural Benes, Binnie Rail, MD [facility]

## 2021-11-09 ENCOUNTER — Telehealth: Payer: Self-pay

## 2021-11-09 NOTE — Telephone Encounter (Signed)
Transition Care Management Unsuccessful Follow-up Telephone Call  Date of discharge and from where:  11/08/2021 from [facility]  Attempts:  1st Attempt  Reason for unsuccessful TCM follow-up call:  Left voice message

## 2021-11-10 ENCOUNTER — Other Ambulatory Visit: Payer: Self-pay

## 2021-11-10 ENCOUNTER — Encounter (HOSPITAL_COMMUNITY): Payer: Self-pay | Admitting: Emergency Medicine

## 2021-11-10 ENCOUNTER — Ambulatory Visit: Payer: Self-pay | Admitting: *Deleted

## 2021-11-10 ENCOUNTER — Ambulatory Visit (HOSPITAL_COMMUNITY)
Admission: EM | Admit: 2021-11-10 | Discharge: 2021-11-10 | Disposition: A | Discharge status: Home / Self Care | Payer: Medicaid Other | Attending: Urgent Care | Admitting: Urgent Care

## 2021-11-10 DIAGNOSIS — T7840XA Allergy, unspecified, initial encounter: Secondary | ICD-10-CM

## 2021-11-10 DIAGNOSIS — K219 Gastro-esophageal reflux disease without esophagitis: Secondary | ICD-10-CM

## 2021-11-10 DIAGNOSIS — T7840XD Allergy, unspecified, subsequent encounter: Secondary | ICD-10-CM

## 2021-11-10 MED ORDER — PREDNISONE 10 MG PO TABS: ORAL_TABLET | ORAL | 0 refills | Status: AC

## 2021-11-10 MED ORDER — FAMOTIDINE 40 MG PO TABS: 40.0000 mg | ORAL_TABLET | Freq: Every day | ORAL | 0 refills | Status: DC

## 2021-11-10 NOTE — ED Triage Notes (Signed)
Pt reports had reaction to ibuprofen and then went to ED (12/19) and was given medications for reaction of rash, headache, tingling in arm and hands, eyes blurry. Ws given medications to help with reaction and rash gone away but still headache that tylenol not helping.

## 2021-11-10 NOTE — Telephone Encounter (Signed)
Called to c/o reaction to medication naproxen. Recent visit to UC for ankle sprain and itching from ibuprofen , now c/o reaction "not feeling right " from naproxen. C/o headache, dizziness, left arm hurting, eyes blurry. Denies chest pain , difficulty breathing and no weakness on either side. Patient unable to take BP. Patient reports she is going to have her daughter take her to  . UC now . Patient was unclear on specific c/o . Symptoms started last night per patient. Care advise given. Patient reports she was going to UC with daughter.

## 2021-11-10 NOTE — Telephone Encounter (Signed)
RN note reviewed.  I agree with disposition.  I am currently out of the office.

## 2021-11-10 NOTE — Discharge Instructions (Addendum)
Take prednisone per taper pack instructions. Take famotidine until symptoms resolve. STOP advil and naproxen. If ankle pain continues, use a ankle brace and follow up with your PCP.

## 2021-11-10 NOTE — Telephone Encounter (Signed)
Transition Care Management Unsuccessful Follow-up Telephone Call  Date of discharge and from where:  11/08/2021 from [facility]  Attempts:  2nd Attempt  Reason for unsuccessful TCM follow-up call:  Left voice message

## 2021-11-10 NOTE — ED Provider Notes (Signed)
[facility]    CSN: [id number] Arrival date & time: 11/10/21  1200      History   Chief Complaint Chief Complaint  Patient presents with   Medication Reaction   Headache    HPI Kijana Estock is a 60 y.o. female.   Pleasant 60yo female presents today with concerns of ongoing allergic reaction. She apparently had an inversion injury to her L ankle several weeks ago for which she had been taking OTC ibuprofen. It helped with the swelling and pain, but several days ago she purchased Advil liquid gels. She accidentally took 10 pills all at once. She states that itching, headache and tingling sensations occurred shortly after taking the 10 advil liquid gels. She was seen in the ER two days ago, given prednisone and famotidine while in the ER which relieved many of her symptoms. She was discharged home on naproxen for her ankle pain in place of the liquid gel advils. Pt has taken it BID x 2 days. She states the itching is primarily resolved, but the headache and tingling remain. She is concerned she has an allergy to NSAIDs. She does have an allergy to ketorolac listed in the chart. She states her ankle is much better and no longer needs medication for it. She is hoping to get some steroid Rx as it helped when she had a dose in the ER. She denies swelling to tongue or face, difficulty swallowing, cough, SOB, wheezing, CP, rash or hives. She does note heartburn. She states this is chronic but certainly worsened after having the allergy symptoms.   Headache  Past Medical History:  Diagnosis Date   Atypical chest pain    a. 11/2005 Negative Myoview   Diastolic dysfunction    DM2 (diabetes mellitus, type 2) (HCC)    Dysrhythmia    AFIB HX CV    GERD (gastroesophageal reflux disease)    Heart murmur    History of substance abuse (HCC)    HTN (hypertension)    Iron deficiency anemia    Paroxysmal atrial fibrillation (HCC) 05/01/2013   On Xarelto   Subaortic membrane    a.  01/2010 Echo: EF 55-60%, No rwma, subaortic membrane with elevated LVOT mean gradient of 21 mmHg, Triv AI, mod dil LA, mildly increased PASP. b. 05/02/2013 Echo:  LVEF 03-00%, grade 1 diastolic dysfunction, mild LVH, subaortic stenosis w/ turbulation in LVOT c/w subaortic membrane (mean gradient 42 mmHg/peak gradient 81 mmHg), mild biatrial enlargement    Patient Active Problem List   Diagnosis Date Noted   Dyslipidemia 10/25/2017   Pap smear for cervical cancer screening 08/18/2016   Diabetic polyneuropathy associated with type 2 diabetes mellitus ([place]) 01/14/2016   Diabetic eye exam ([place]) 01/14/2016   Screening for colorectal cancer 01/14/2016   Neck pain 06/29/2015   Type 2 diabetes mellitus with complication, without long-term current use of insulin ([place]) 03/16/2015   Essential hypertension 03/16/2015   BV (bacterial vaginosis) 12/11/2014   Screening for colon cancer 12/10/2014   Keloid scar 12/10/2014   Screening for STD (sexually transmitted disease) 12/10/2014   Onychomycosis of toenail 12/10/2014   Cerumen impaction 12/10/2014   Diabetes mellitus type 2 with neurological manifestations ([place]) 08/14/2014   Blurry vision, bilateral 08/14/2014   PAF (paroxysmal atrial fibrillation) ([place]) 05/08/2013   ETOH abuse 05/02/2013   Marijuana abuse 92/33/0076   Diastolic dysfunction 22/63/3354   Depression with anxiety 05/01/2013   Subaortic membrane    HTN (hypertension)    DEPENDENCE, COCAINE, 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      Home Medications    Prior to Admission medications   Medication Sig Start Date End Date Taking? Authorizing Provider  carvedilol (COREG) 3.125 MG tablet TAKE 1  TABLET BY MOUTH TWICE A DAY 10/07/21   Ladell Pier, MD  famotidine (PEPCID) 40 MG tablet Take 1 tablet (40 mg total) by mouth daily. 11/10/21  Yes Abriel Hattery L, PA  furosemide (LASIX) 20 MG tablet TAKE 1 TABLET BY MOUTH EVERY DAY 09/20/21   Ladell Pier, MD  predniSONE (DELTASONE) 10 MG tablet Take 6 tablets (60 mg total) by mouth daily with breakfast for 2 days, THEN 5 tablets (50 mg total) daily with breakfast for 2 days, THEN 4 tablets (40 mg total) daily with breakfast for 2 days, THEN 3 tablets (30 mg total) daily with breakfast for 2 days, THEN 2 tablets (20 mg total) daily with breakfast for 2 days, THEN 1 tablet (10 mg total) daily with breakfast for 2 days. 11/10/21 11/22/21 Yes Kadin Bera L, PA  Accu-Chek FastClix Lancets MISC USE AS DIRECTED 07/30/21   Ladell Pier, MD  alum & mag hydroxide-simeth (MAALOX/MYLANTA) 200-200-20 MG/5ML suspension Take 15 mLs by mouth every 6 (six) hours as needed for indigestion or heartburn. 10/30/19   Fulp, Cammie, MD  amitriptyline (ELAVIL) 50 MG tablet TAKE 1 TABLET BY MOUTH EVERYDAY AT BEDTIME 09/01/21   Ladell Pier, MD  amLODipine (NORVASC) 10 MG tablet TAKE 1 TABLET BY MOUTH EVERY DAY 08/03/21   Ladell Pier, MD  Blood Glucose Calibration (ACCU-CHEK AVIVA) SOLN USE AS INSTRUCTED 01/11/16   Tresa Garter, MD  Blood Glucose Monitoring Suppl (ACCU-CHEK AVIVA PLUS) w/Device KIT USE AS DIRECTED TWICE DAILY AT 8 AM AND 10 PM 01/04/16   Jegede, Marlena Clipper, MD  Calcium Carb-Cholecalciferol (CALCIUM-VITAMIN D3) 500-400 MG-UNIT TABS Take 1 tablet by mouth daily. 04/11/18   Argentina Donovan, PA-C  cetirizine (ZYRTEC) 10 MG tablet TAKE 1 TABLET (10 MG TOTAL) BY MOUTH DAILY. AS NEEDED FOR NASAL CONGESTION 09/19/20   Fulp, Cammie, MD  dabigatran (PRADAXA) 150 MG CAPS capsule TAKE 1 CAPSULE BY MOUTH TWICE A DAY 08/03/21   Charlott Rakes, MD  fenofibrate (TRICOR) 145 MG tablet TAKE 1 TABLET BY MOUTH EVERY DAY 08/03/21   Charlott Rakes, MD  ferrous sulfate 325 (65 FE) MG tablet TAKE 1 TABLET BY MOUTH EVERY DAY AFTER A MEAL 10/29/21   Ladell Pier, MD  gabapentin (NEURONTIN) 600 MG tablet Take 1 tablet (600 mg total) by mouth 3 (three) times daily. 07/30/21   Ladell Pier, MD  glucose blood (ACCU-CHEK AVIVA PLUS) test strip Check BS 1-2 times a day 07/30/21   Ladell Pier, MD  hydrOXYzine (ATARAX/VISTARIL) 25 MG tablet TAKE 1 TABLET BY MOUTH 3 TIMES DAILY AS NEEDED FOR ANXIETY. 12/21/20   Gildardo Pounds, NP  metFORMIN (GLUCOPHAGE) 1000 MG tablet TAKE 1 TABLET BY MOUTH 2 TIMES DAILY WITH A MEAL. 12/21/20   Gildardo Pounds, NP  Multiple Vitamin (MULTIVITAMIN WITH MINERALS) TABS tablet Take 1 tablet by mouth daily.    [provider]  naproxen (NAPROSYN) 375 MG tablet Take 1 tablet (375  mg total) by mouth 2 (two) times daily. 11/08/21   Regan Lemming, MD  Omega-3 Fatty Acids (FISH OIL) 1000 MG CPDR Take 3 g by mouth daily. Patient taking differently: Take 2 capsules by mouth daily. 04/11/18   Argentina Donovan, PA-C  omeprazole (PRILOSEC) 40 MG capsule TAKE 1 CAPSULE (40 MG TOTAL) BY MOUTH DAILY. 08/03/21   Ladell Pier, MD  polyethylene glycol powder (GAVILAX) 17 GM/SCOOP powder MIX 1 CAPFUL WITH LIQUID AND DRINK ONCE DAILY 10/30/19   Fulp, Cammie, MD  potassium chloride (K-DUR,KLOR-CON) 10 MEQ tablet Take 1 tablet (10 mEq total) by mouth daily. 11/06/13 04/24/19  Tresa Garter, MD    Family History Family History  Problem Relation Age of Onset   Diabetes Father        deceased   Cancer Mother 9       colon   Stroke Mother    Hypertension Sister        alive and well   Hypertension Sister        alive and well   Heart attack Brother        deceased @ 37    Social History Social History   Tobacco Use   Smoking status: Never   Smokeless tobacco: Current    Types: Snuff  Vaping Use   Vaping Use: Never used  Substance Use Topics   Alcohol use: Yes    Alcohol/week: 2.0 -  3.0 standard drinks    Types: 2 - 3 Glasses of wine per week   Drug use: No    Types: Marijuana    Comment: Smokes marijuana weekly.  Has not used cocaine in 9 years.     Allergies   Xarelto [rivaroxaban] and Ketorolac tromethamine   Review of Systems Review of Systems  Neurological:  Positive for headaches.  As noted in HPI  Physical Exam Triage Vital Signs ED Triage Vitals  Enc Vitals Group     BP 11/10/21 1344 125/83     Pulse Rate 11/10/21 1344 (!) 59     Resp 11/10/21 1344 18     Temp 11/10/21 1344 97.9 F (36.6 C)     Temp Source 11/10/21 1344 Oral     SpO2 11/10/21 1344 98 %     Weight --      Height --      Head Circumference --      Peak Flow --      Pain Score 11/10/21 1343 8     Pain Loc --      Pain Edu? --      Excl. in [place]? --    No data found.  Updated Vital Signs BP 125/83 (BP Location: Right Arm)    Pulse (!) 59    Temp 97.9 F (36.6 C) (Oral)    Resp 18    SpO2 98%   Visual Acuity Right Eye Distance:   Left Eye Distance:   Bilateral Distance:    Right Eye Near:   Left Eye Near:    Bilateral Near:     Physical Exam Vitals and nursing note reviewed.  Constitutional:      General: She is not in acute distress.    Appearance: She is well-developed and normal weight. She is not ill-appearing, toxic-appearing or diaphoretic.  HENT:     Head: Normocephalic and atraumatic.     Mouth/Throat:     Mouth: Mucous membranes are moist.     Pharynx: Oropharynx is clear.  Eyes:  General: No scleral icterus.    Extraocular Movements: Extraocular movements intact.     Right eye: Normal extraocular motion.     Left eye: Normal extraocular motion.     Conjunctiva/sclera: Conjunctivae normal.     Pupils: Pupils are equal, round, and reactive to light. Pupils are equal.  Cardiovascular:     Rate and Rhythm: Normal rate and regular rhythm.     Heart sounds: No murmur heard.   No friction rub.  Pulmonary:     Effort: Pulmonary effort is normal. No  respiratory distress.     Breath sounds: Normal breath sounds. No wheezing or rales.  Chest:     Chest wall: No tenderness.  Abdominal:     General: There is no distension.     Palpations: Abdomen is soft.     Tenderness: There is no abdominal tenderness. There is no guarding.  Musculoskeletal:        General: No swelling or tenderness. Normal range of motion.     Cervical back: Normal range of motion and neck supple. No rigidity.     Comments: Left ankle unremarkable  Lymphadenopathy:     Cervical: No cervical adenopathy.  Skin:    General: Skin is warm and dry.     Capillary Refill: Capillary refill takes less than 2 seconds.     Findings: No erythema or rash.  Neurological:     Mental Status: She is alert.     Cranial Nerves: No facial asymmetry.     Sensory: No sensory deficit.  Psychiatric:        Mood and Affect: Mood normal.     UC Treatments / Results  Labs (all labs ordered are listed, but only abnormal results are displayed) Labs Reviewed - No data to display  EKG   Radiology No results found.  Procedures Procedures (including critical care time)  Medications Ordered in UC Medications - No data to display  Initial Impression / Assessment and Plan / UC Course  I have reviewed the triage vital signs and the nursing notes.  Pertinent labs & imaging results that were available during my care of the patient were reviewed by me and considered in my medical decision making (see chart for details).     Allergic reaction -patient had a good response to the famotidine and prednisone offered in the ER.  Question of her continued symptoms this is related to the prescription of naproxen.  As patient's ankle is now no longer painful or swollen, she is to stop the naproxen.  Will give prescription prednisone to take at home to combat side effects of NSAIDs.  We will also start famotidine. GERD -chronic, recently uncontrolled with new allergic reaction symptoms.  Add  famotidine  Final Clinical Impressions(s) / UC Diagnoses   Final diagnoses:  Allergic reaction, subsequent encounter     Discharge Instructions      Take prednisone per taper pack instructions. Take famotidine until symptoms resolve. STOP advil and naproxen. If ankle pain continues, use a ankle brace and follow up with your PCP.     ED Prescriptions     Medication Sig Dispense Auth. Provider   predniSONE (DELTASONE) 10 MG tablet Take 6 tablets (60 mg total) by mouth daily with breakfast for 2 days, THEN 5 tablets (50 mg total) daily with breakfast for 2 days, THEN 4 tablets (40 mg total) daily with breakfast for 2 days, THEN 3 tablets (30 mg total) daily with breakfast for 2 days, THEN 2 tablets (  20 mg total) daily with breakfast for 2 days, THEN 1 tablet (10 mg total) daily with breakfast for 2 days. 42 tablet Bronwyn Belasco L, PA   famotidine (PEPCID) 40 MG tablet Take 1 tablet (40 mg total) by mouth daily. 30 tablet Chantry Headen L, [place]      PDMP not reviewed this encounter.   Chaney Malling, [place] 11/10/21 1836

## 2021-11-10 NOTE — Telephone Encounter (Signed)
Reason for Disposition  [1] Caller has NON-URGENT medicine question about med that PCP prescribed AND [2] triager unable to answer question  [1] Tingling (e.g., pins and needles) of the face, arm / hand, or leg / foot on one side of the body AND [2] present now (Exceptions: chronic/recurrent symptom lasting > 4 weeks or tingling from known cause, such as: bumped elbow, carpal tunnel syndrome, pinched nerve, frostbite)  Answer Assessment - Initial Assessment Questions 1. NAME of MEDICATION: "What medicine are you calling about?"     Naproxen  2. QUESTION: "What is your question?" (e.g., double dose of medicine, side effect)     Patient feels naproxen is also causing a reaction  3. PRESCRIBING HCP: "Who prescribed it?" Reason: if prescribed by specialist, call should be referred to that group.     [facility] MD at [facility]  4. SYMPTOMS: "Do you have any symptoms?"     Hands tingling, headache, eyes blurry dizzy 5. SEVERITY: If symptoms are present, ask "Are they mild, moderate or severe?"     na 6. PREGNANCY:  "Is there any chance that you are pregnant?" "When was your last menstrual period?"     na  Answer Assessment - Initial Assessment Questions 1. SYMPTOM: "What is the main symptom you are concerned about?" (e.g., weakness, numbness)     Headache, left arm pain, hands tingling, eyes blurry, dizziness  2. ONSET: "When did this start?" (minutes, hours, days; while sleeping)     Last night  3. LAST NORMAL: "When was the last time you (the patient) were normal (no symptoms)?"     Last night  4. PATTERN "Does this come and go, or has it been constant since it started?"  "Is it present now?"     na 5. CARDIAC SYMPTOMS: "Have you had any of the following symptoms: chest pain, difficulty breathing, palpitations?"     na 6. NEUROLOGIC SYMPTOMS: "Have you had any of the following symptoms: headache, dizziness, vision loss, double vision, changes in speech, unsteady on your feet?"     Dizziness, blurry  vision  7. OTHER SYMPTOMS: "Do you have any other symptoms?"     Questions reaction to naproxen  8. PREGNANCY: "Is there any chance you are pregnant?" "When was your last menstrual period?"     na  Protocols used: Medication Question Call-A-AH, Neurologic Deficit-A-AH

## 2021-11-11 NOTE — Telephone Encounter (Signed)
Transition Care Management Unsuccessful Follow-up Telephone Call  Date of discharge and from where:  11/08/2021 from [facility]  Attempts:  3rd Attempt  Reason for unsuccessful TCM follow-up call:  Unable to reach patient

## 2021-11-30 ENCOUNTER — Encounter: Payer: Self-pay | Admitting: Internal Medicine

## 2021-11-30 ENCOUNTER — Ambulatory Visit: Payer: Medicaid Other | Attending: Internal Medicine | Admitting: Internal Medicine

## 2021-11-30 ENCOUNTER — Other Ambulatory Visit: Payer: Self-pay

## 2021-11-30 VITALS — BP 123/81 | HR 72 | Resp 16 | Wt 187.0 lb

## 2021-11-30 DIAGNOSIS — I480 Paroxysmal atrial fibrillation: Secondary | ICD-10-CM | POA: Insufficient documentation

## 2021-11-30 DIAGNOSIS — Z23 Encounter for immunization: Secondary | ICD-10-CM

## 2021-11-30 DIAGNOSIS — F17290 Nicotine dependence, other tobacco product, uncomplicated: Secondary | ICD-10-CM

## 2021-11-30 DIAGNOSIS — D509 Iron deficiency anemia, unspecified: Secondary | ICD-10-CM

## 2021-11-30 DIAGNOSIS — D6869 Other thrombophilia: Secondary | ICD-10-CM

## 2021-11-30 DIAGNOSIS — E663 Overweight: Secondary | ICD-10-CM

## 2021-11-30 DIAGNOSIS — E1140 Type 2 diabetes mellitus with diabetic neuropathy, unspecified: Secondary | ICD-10-CM | POA: Diagnosis not present

## 2021-11-30 DIAGNOSIS — E785 Hyperlipidemia, unspecified: Secondary | ICD-10-CM | POA: Diagnosis not present

## 2021-11-30 DIAGNOSIS — I10 Essential (primary) hypertension: Secondary | ICD-10-CM | POA: Diagnosis not present

## 2021-11-30 DIAGNOSIS — Z6829 Body mass index (BMI) 29.0-29.9, adult: Secondary | ICD-10-CM

## 2021-11-30 LAB — POCT GLYCOSYLATED HEMOGLOBIN (HGB A1C): HbA1c, POC (controlled diabetic range): 6.9 % (ref 0.0–7.0)

## 2021-11-30 NOTE — Progress Notes (Signed)
Patient ID: Jane Arellano, female    DOB: 06-21-1961  MRN: [id number]  CC: Diabetes   Subjective: Jane Arellano is a 61 y.o. female who presents for chronic ds management Her concerns today include:  Patient with history of DM type II with neuropathy, HTN, HL, PAF, substance use cocaine, anemia,   DIABETES TYPE 2 Last A1C:   Results for orders placed or performed in visit on 11/30/21  POCT glycosylated hemoglobin (Hb A1C)  Result Value Ref Range   Hemoglobin A1C     HbA1c POC (<> result, manual entry)     HbA1c, POC (prediabetic range)     HbA1c, POC (controlled diabetic range) 6.9 0.0 - 7.0 %    Med Adherence:  [x] Yes she is on metformin.    Medication side effects:  [] Yes    [x] No Home Monitoring?  [x] Yes    [] No Home glucose results range: before BF - 113-140, and bedtime 130s Diet Adherence: [x] Yes  -eating less sweets, does not snack much and less fried foods Exercise: [x] Yes  -does a 20 min workout 3 times a wk   Hypoglycemic episodes?: [] Yes    [x] No Numbness of the feet? [] Yes    [x] No Retinopathy hx? [] Yes    [] No Last eye exam: has appt scheduled next mth with [facility] Comments:   HYPERTENSION Currently taking: see medication list.  She is on Norvasc, furosemide and carvedilol. Med Adherence: [x] Yes    [] No Medication side effects: [] Yes    [x] No Adherence with salt restriction: [x] Yes    [] No Home Monitoring?: [] Yes    [x] No Monitoring Frequency:  Home BP results range:  SOB? [] Yes    [x] No Chest Pain?: [] Yes    [x] No Leg swelling?: [] Yes    [x] No Headaches?: [] Yes    [x] No Dizziness? [] Yes    [x] No Comments:   HL:  on Fenofibrate and tolerating  PAF: no palpitations.  No bruising or bleeding  No issues with depression Reports clean of cocaine 15-20 yrs  Anemia:  last CBC was nl.  Still taking iron once a day.    HM:  has appt for [facility] later this mth.  Due for shingles vaccine  Patient Active Problem List    Diagnosis Date Noted   Secondary hypercoagulable state ([place]) 11/30/2021   Dyslipidemia 10/25/2017   Pap smear for cervical cancer screening 08/18/2016   Diabetic polyneuropathy associated with type 2 diabetes mellitus ([place]) 01/14/2016   Diabetic eye exam ([place]) 01/14/2016   Screening for colorectal cancer 01/14/2016   Neck pain 06/29/2015   Type 2 diabetes mellitus with complication, without long-term current use of insulin ([place]) 03/16/2015   Essential hypertension 03/16/2015   BV (bacterial vaginosis) 12/11/2014   Screening for colon cancer 12/10/2014   Keloid scar 12/10/2014   Screening for STD (sexually transmitted disease) 12/10/2014   Onychomycosis of toenail 12/10/2014   Cerumen impaction 12/10/2014   Diabetes mellitus type 2 with neurological manifestations ([place]) 08/14/2014   Blurry vision, bilateral 08/14/2014   PAF (paroxysmal atrial fibrillation) ([place]) 05/08/2013   ETOH abuse 05/02/2013   Marijuana abuse 66/59/9357   Diastolic dysfunction 01/77/9390   Depression with anxiety 05/01/2013   Subaortic membrane    HTN (hypertension)    GERD 04/30/2007     Current Outpatient Medications on File Prior to Visit  Medication Sig Dispense Refill  ° carvedilol (COREG) 3.125 MG tablet TAKE 1 TABLET BY MOUTH TWICE A DAY 180 tablet 0  ° furosemide (LASIX) 20 MG tablet TAKE 1 TABLET BY MOUTH EVERY DAY 90 tablet 0  ° Accu-Chek FastClix Lancets MISC USE AS DIRECTED 100 each 11  ° alum & mag hydroxide-simeth (MAALOX/MYLANTA) 200-200-20 MG/5ML suspension Take 15 mLs by mouth every 6 (six) hours as needed for indigestion or heartburn. 355 mL 5  ° amitriptyline (ELAVIL) 50 MG tablet TAKE 1 TABLET BY MOUTH EVERYDAY AT BEDTIME 90 tablet 0  ° amLODipine (NORVASC) 10 MG tablet TAKE 1 TABLET BY MOUTH EVERY DAY 90 tablet 1  ° Blood Glucose Calibration (ACCU-CHEK AVIVA) SOLN USE AS INSTRUCTED 1 each 0  ° Blood Glucose Monitoring Suppl (ACCU-CHEK AVIVA PLUS) w/Device KIT USE AS DIRECTED TWICE DAILY AT 8 AM  AND 10 PM 1 kit 0  ° Calcium Carb-Cholecalciferol (CALCIUM-VITAMIN D3) 500-400 MG-UNIT TABS Take 1 tablet by mouth daily. 60 tablet 5  ° cetirizine (ZYRTEC) 10 MG tablet TAKE 1 TABLET (10 MG TOTAL) BY MOUTH DAILY. AS NEEDED FOR NASAL CONGESTION 90 tablet 0  ° dabigatran (PRADAXA) 150 MG CAPS capsule TAKE 1 CAPSULE BY MOUTH TWICE A DAY 180 capsule 1  ° famotidine (PEPCID) 40 MG tablet Take 1 tablet (40 mg total) by mouth daily. 30 tablet 0  ° fenofibrate (TRICOR) 145 MG tablet TAKE 1 TABLET BY MOUTH EVERY DAY 90 tablet 1  ° ferrous sulfate 325 (65 FE) MG tablet TAKE 1 TABLET BY MOUTH EVERY DAY AFTER A MEAL 90 tablet 0  ° gabapentin (NEURONTIN) 600 MG tablet Take 1 tablet (600 mg total) by mouth 3 (three) times daily. 90 tablet 6  ° glucose blood (ACCU-CHEK AVIVA PLUS) test strip Check BS 1-2 times a day 100 each 11  ° hydrOXYzine (ATARAX/VISTARIL) 25 MG tablet TAKE 1 TABLET BY MOUTH 3 TIMES DAILY AS NEEDED FOR ANXIETY. 60 tablet 5  ° metFORMIN (GLUCOPHAGE) 1000 MG tablet TAKE 1 TABLET BY MOUTH 2 TIMES DAILY WITH A MEAL. 180 tablet 3  ° Multiple Vitamin (MULTIVITAMIN WITH MINERALS) TABS tablet Take 1 tablet by mouth daily.    ° naproxen (NAPROSYN) 375 MG tablet Take 1 tablet (375 mg total) by mouth 2 (two) times daily. 20 tablet 0  ° Omega-3 Fatty Acids (FISH OIL) 1000 MG CPDR Take 3 g by mouth daily. (Patient taking differently: Take 2 capsules by mouth daily.) 90 capsule 6  ° omeprazole (PRILOSEC) 40 MG capsule TAKE 1 CAPSULE (40 MG TOTAL) BY MOUTH DAILY. 90 capsule 1  ° polyethylene glycol powder (GAVILAX) 17 GM/SCOOP powder MIX 1 CAPFUL WITH LIQUID AND DRINK ONCE DAILY 476 g 6  ° [DISCONTINUED] potassium chloride (K-DUR,KLOR-CON) 10 MEQ tablet Take 1 tablet (10 mEq total) by mouth daily. 30 tablet 0  ° °No current facility-administered medications on file prior to visit.  ° ° °Allergies  °Allergen Reactions  ° Xarelto [Rivaroxaban] Swelling  °  Gums bled, headaches  ° Ketorolac Tromethamine Other (See Comments)  °   Unknown reaction  ° ° °Social History  ° °Socioeconomic History  ° Marital status: Single  °  Spouse name: Not on file  ° Number of children: Not on file  ° Years of education: Not on file  ° Highest education level: Not on file  °Occupational History  ° Not on file  °Tobacco Use  ° Smoking status: Never  ° Smokeless tobacco: Current  °  Types: Snuff  °Vaping   Use  ° Vaping Use: Never used  °Substance and Sexual Activity  ° Alcohol use: Yes  °  Alcohol/week: 2.0 - 3.0 standard drinks  °  Types: 2 - 3 Glasses of wine per week  ° Drug use: No  °  Types: Marijuana  °  Comment: Smokes marijuana weekly.  Has not used cocaine in 9 years.  ° Sexual activity: Yes  °  Birth control/protection: None  °Other Topics Concern  ° Not on file  °Social History Narrative  ° Lives in [place] by herself.  She had been caring for her mother but she died 2 mos ago.  She tries to remain active but does not regularly exercise.  ° °Social Determinants of Health  ° °Financial Resource Strain: Not on file  °Food Insecurity: Not on file  °Transportation Needs: Not on file  °Physical Activity: Not on file  °Stress: Not on file  °Social Connections: Not on file  °Intimate Partner Violence: Not on file  ° ° °Family History  °Problem Relation Age of Onset  ° Diabetes Father   °     deceased  ° Cancer Mother 85  °     colon  ° Stroke Mother   ° Hypertension Sister   °     alive and well  ° Hypertension Sister   °     alive and well  ° Heart attack Brother   °     deceased @ 37  ° ° °Past Surgical History:  °Procedure Laterality Date  ° CARDIOVERSION N/A 11/04/2013  ° Procedure: CARDIOVERSION;  Surgeon: Peter M Jordan, MD;  Location: [facility];  Service: Cardiovascular;  Laterality: N/A;  ° CESAREAN SECTION    ° ° °ROS: °Review of Systems °Negative except as stated above ° °PHYSICAL EXAM: °BP 123/81    Pulse 72    Resp 16    Wt 187 lb (84.8 kg)    SpO2 97%    BMI 29.29 kg/m²   °Wt Readings from Last 3 Encounters:  °11/30/21 187 lb (84.8 kg)   °08/04/21 191 lb (86.6 kg)  °07/30/21 187 lb 12.8 oz (85.2 kg)  ° ° °Physical Exam ° °General appearance - alert, well appearing, older African-American female and in no distress °Mental status - normal mood, behavior, speech, dress, motor activity, and thought processes °Mouth - mucous membranes moist, pharynx normal without lesions °Neck - supple, no significant adenopathy °Chest - clear to auscultation, no wheezes, rales or rhonchi, symmetric air entry °Heart - normal rate, regular rhythm, normal S1, S2, no murmurs, rubs, clicks or gallops °Extremities - peripheral pulses normal, no pedal edema, no clubbing or cyanosis °Diabetic Foot Exam - Simple   °No data filed °  ° ° ° °Depression screen PHQ 2/9 11/30/2021 07/30/2021 12/21/2020  °Decreased Interest 0 0 0  °Down, Depressed, Hopeless 1 2 0  °PHQ - 2 Score 1 2 0  °Altered sleeping - 1 0  °Tired, decreased energy - 0 0  °Change in appetite - 0 0  °Feeling bad or failure about yourself  - 0 0  °Trouble concentrating - 0 0  °Moving slowly or fidgety/restless - 0 0  °Suicidal thoughts - 0 0  °PHQ-9 Score - 3 0  °Difficult doing work/chores - - -  °Some recent data might be hidden  ° ° °CMP Latest Ref Rng & Units 07/30/2021 11/09/2020 04/13/2020  °Glucose 65 - 99 mg/dL 175(H) 92 151(H)  °BUN 8 - 27 mg/dL 8 8 10  °  Creatinine 0.57 - 1.00 mg/dL 0.82 0.79 0.84  °Sodium 134 - 144 mmol/L 142 143 141  °Potassium 3.5 - 5.2 mmol/L 4.1 4.5 4.0  °Chloride 96 - 106 mmol/L 101 104 103  °CO2 20 - 29 mmol/L 23 23 22  °Calcium 8.7 - 10.3 mg/dL 10.3 10.6(H) 10.7(H)  °Total Protein 6.0 - 8.5 g/dL 8.0 - 7.7  °Total Bilirubin 0.0 - 1.2 mg/dL 0.3 - 0.3  °Alkaline Phos 44 - 121 IU/L 49 - 44(L)  °AST 0 - 40 IU/L 22 - 15  °ALT 0 - 32 IU/L 16 - 11  ° °Lipid Panel  °   °Component Value Date/Time  ° CHOL 134 07/30/2021 1125  ° TRIG 104 07/30/2021 1125  ° HDL 52 07/30/2021 1125  ° CHOLHDL 2.6 07/30/2021 1125  ° CHOLHDL 2.9 09/12/2016 0939  ° VLDL NOT CALC 09/12/2016 0939  ° LDLCALC 63 07/30/2021 1125   ° ° °CBC °   °Component Value Date/Time  ° WBC 5.0 07/30/2021 1125  ° WBC 3.9 (L) 07/07/2018 1049  ° RBC 3.90 07/30/2021 1125  ° RBC 3.72 (L) 07/07/2018 1049  ° HGB 12.7 07/30/2021 1125  ° HCT 36.4 07/30/2021 1125  ° PLT 208 07/30/2021 1125  ° MCV 93 07/30/2021 1125  ° MCH 32.6 07/30/2021 1125  ° MCH 32.3 07/07/2018 1049  ° MCHC 34.9 07/30/2021 1125  ° MCHC 31.7 07/07/2018 1049  ° RDW 12.0 07/30/2021 1125  ° LYMPHSABS 1.8 06/27/2019 1126  ° MONOABS 246 08/18/2016 1114  ° EOSABS 0.1 06/27/2019 1126  ° BASOSABS 0.0 06/27/2019 1126  ° ° °ASSESSMENT AND PLAN: ° °1. Type 2 diabetes mellitus with diabetic neuropathy, without long-term current use of insulin (HCC) °At goal.  Continue metformin, healthy eating habits and regular exercise. °- POCT glycosylated hemoglobin (Hb A1C) °- Microalbumin / creatinine urine ratio ° °2. Essential hypertension °Close to goal.  Continue amlodipine and carvedilol. ° °3. Dyslipidemia °Has history of hypertriglyceridemia and is on fenofibrate.  LDL is at goal. ° °4. PAF (paroxysmal atrial fibrillation) (HCC) °In sinus rhythm.  Continue carvedilol and Pradaxa.  She is followed by cardiology. ° °5. Secondary hypercoagulable state (HCC) °See #4 above. ° °6. Overweight (BMI 25.0-29.9) °See #1 above. ° °7. Iron deficiency anemia, unspecified iron deficiency anemia type °Last CBC was normal.  I recommend cutting back on iron supplement to just 3 times a week. ° °8. Need for shingles vaccine °Given the first Shingrix shot today by CMA.  Confirmed. ° ° °Patient was given the opportunity to ask questions.  Patient verbalized understanding of the plan and was able to repeat key elements of the plan.  ° °Orders Placed This Encounter  °Procedures  ° Varicella-zoster vaccine IM  ° Microalbumin / creatinine urine ratio  ° POCT glycosylated hemoglobin (Hb A1C)  ° ° ° °Requested Prescriptions  ° ° No prescriptions requested or ordered in this encounter  ° ° °Return in about 4 months (around  03/30/2022). ° °Deborah Johnson, MD, FACP °

## 2021-11-30 NOTE — Patient Instructions (Signed)
I recommend decreasing iron supplement to 3 times a week.

## 2021-12-01 LAB — MICROALBUMIN / CREATININE URINE RATIO
Creatinine, Urine: 24 mg/dL
Microalb/Creat Ratio: 17 mg/g creat (ref 0–29)
Microalbumin, Urine: 4.1 ug/mL

## 2021-12-13 ENCOUNTER — Ambulatory Visit
Admission: RE | Admit: 2021-12-13 | Discharge: 2021-12-13 | Disposition: A | Discharge status: Home / Self Care | Payer: Medicaid Other | Source: Ambulatory Visit | Attending: Family Medicine | Admitting: Family Medicine

## 2021-12-13 DIAGNOSIS — Z1231 Encounter for screening mammogram for malignant neoplasm of breast: Secondary | ICD-10-CM | POA: Diagnosis not present

## 2022-01-14 ENCOUNTER — Other Ambulatory Visit: Payer: Self-pay | Admitting: Internal Medicine

## 2022-01-14 ENCOUNTER — Other Ambulatory Visit: Payer: Self-pay | Admitting: Nurse Practitioner

## 2022-01-14 ENCOUNTER — Other Ambulatory Visit: Payer: Self-pay | Admitting: Family Medicine

## 2022-01-14 DIAGNOSIS — E785 Hyperlipidemia, unspecified: Secondary | ICD-10-CM

## 2022-01-14 DIAGNOSIS — I10 Essential (primary) hypertension: Secondary | ICD-10-CM

## 2022-01-14 DIAGNOSIS — K219 Gastro-esophageal reflux disease without esophagitis: Secondary | ICD-10-CM

## 2022-01-14 DIAGNOSIS — E118 Type 2 diabetes mellitus with unspecified complications: Secondary | ICD-10-CM

## 2022-01-14 NOTE — Telephone Encounter (Signed)
Next appointment 03/31/22- passes protocol for RF  Requested Prescriptions  Pending Prescriptions Disp Refills   furosemide (LASIX) 20 MG tablet [Pharmacy Med Name: FUROSEMIDE 20 MG TABLET] 90 tablet 0    Sig: TAKE 1 TABLET BY MOUTH EVERY DAY     Cardiovascular:  Diuretics - Loop Failed - 01/14/2022  9:09 AM      Failed - Mg Level in normal range and within 180 days    Magnesium  Date Value Ref Range Status  03/19/2014 2.3 1.5 - 2.5 mg/dL Final         Passed - K in normal range and within 180 days    Potassium  Date Value Ref Range Status  07/30/2021 4.1 3.5 - 5.2 mmol/L Final         Passed - Ca in normal range and within 180 days    Calcium  Date Value Ref Range Status  07/30/2021 10.3 8.7 - 10.3 mg/dL Final   Calcium, Ion  Date Value Ref Range Status  03/18/2014 1.22 1.12 - 1.23 mmol/L Final         Passed - Na in normal range and within 180 days    Sodium  Date Value Ref Range Status  07/30/2021 142 134 - 144 mmol/L Final         Passed - Cr in normal range and within 180 days    Creat  Date Value Ref Range Status  08/18/2016 0.64 0.50 - 1.05 mg/dL Final    Comment:      For patients > or = 62 years of age: The upper reference limit for Creatinine is approximately 13% higher for people identified as African-American.      Creatinine, Ser  Date Value Ref Range Status  07/30/2021 0.82 0.57 - 1.00 mg/dL Final   Creatinine, POC  Date Value Ref Range Status  04/26/2017 50 mg/dL Final   Creatinine, Urine  Date Value Ref Range Status  12/10/2014 38.8 mg/dL Final    Comment:    No reference range established.         Passed - Cl in normal range and within 180 days    Chloride  Date Value Ref Range Status  07/30/2021 101 96 - 106 mmol/L Final         Passed - Last BP in normal range    BP Readings from Last 1 Encounters:  11/30/21 123/81         Passed - Valid encounter within last 6 months    Recent Outpatient Visits          1 month ago Type 2  diabetes mellitus with diabetic neuropathy, without long-term current use of insulin ([place])   [place], [name] B, MD   5 months ago Type 2 diabetes mellitus with diabetic neuropathy, without long-term current use of insulin ([place])   [place] Karle Plumber B, MD   1 year ago Type 2 diabetes mellitus with complication, without long-term current use of insulin ([place])   [place], Vernia Buff, NP   1 year ago Well female exam with routine gynecological exam   [place] Fulp, [place], MD   2 years ago Diabetic polyneuropathy associated with type 2 diabetes mellitus ([place])   [place] Antony Blackbird, MD      Future Appointments  In 2 months Ladell Pier, MD [place]            omeprazole (PRILOSEC) 40 MG capsule [Pharmacy Med Name: OMEPRAZOLE DR 40 MG CAPSULE] 90 capsule 0    Sig: TAKE 1 CAPSULE (40 MG TOTAL) BY MOUTH DAILY.     Gastroenterology: Proton Pump Inhibitors Passed - 01/14/2022  9:09 AM      Passed - Valid encounter within last 12 months    Recent Outpatient Visits          1 month ago Type 2 diabetes mellitus with diabetic neuropathy, without long-term current use of insulin ([place])   [place] Karle Plumber B, MD   5 months ago Type 2 diabetes mellitus with diabetic neuropathy, without long-term current use of insulin ([place])   [place] Ladell Pier, MD   1 year ago Type 2 diabetes mellitus with complication, without long-term current use of insulin ([place])   [place], Vernia Buff, NP   1 year ago Well female exam with routine gynecological exam   [place] Fulp, [place], MD   2 years ago Diabetic polyneuropathy associated  with type 2 diabetes mellitus ([place])   [place], [place], MD      Future Appointments            In 2 months Ladell Pier, MD [place]            ferrous sulfate 325 (65 FE) MG tablet [Pharmacy Med Name: FERROUS SULFATE 325 MG TABLET] 90 tablet 0    Sig: TAKE 1 TABLET BY MOUTH EVERY DAY AFTER A MEAL     Endocrinology:  Minerals - Iron Supplementation Failed - 01/14/2022  9:09 AM      Failed - Fe (serum) in normal range and within 360 days    Iron  Date Value Ref Range Status  08/03/2009 149 (H) 42 - 145 mcg/dL Final   Saturation Ratios  Date Value Ref Range Status  08/03/2009 44 20 - 55 % Final         Failed - Ferritin in normal range and within 360 days    No results found for: FERRITIN       Passed - HGB in normal range and within 360 days    Hemoglobin  Date Value Ref Range Status  07/30/2021 12.7 11.1 - 15.9 g/dL Final         Passed - HCT in normal range and within 360 days    Hematocrit  Date Value Ref Range Status  07/30/2021 36.4 34.0 - 46.6 % Final         Passed - RBC in normal range and within 360 days    RBC  Date Value Ref Range Status  07/30/2021 3.90 3.77 - 5.28 x10E6/uL Final  07/07/2018 3.72 (L) 3.87 - 5.11 MIL/uL Final         Passed - Valid encounter within last 12 months    Recent Outpatient Visits          1 month ago Type 2 diabetes mellitus with diabetic neuropathy, without long-term current use of insulin ([place])   [place], [name] B, MD   5 months ago Type 2 diabetes mellitus with diabetic neuropathy, without long-term current use of insulin ([place])   [place] Karle Plumber B, MD   1 year ago Type 2 diabetes mellitus with complication, without long-term current use of insulin [facility])   [place], Vernia Buff, NP   1 year ago Well female exam with routine  gynecological exam   [place] Fulp, [place], MD   2 years ago Diabetic polyneuropathy associated with type 2 diabetes mellitus ([place])   [place] Antony Blackbird, MD      Future Appointments            In 2 months Ladell Pier, MD [place]            amLODipine (NORVASC) 10 MG tablet [Pharmacy Med Name: AMLODIPINE BESYLATE 10 MG TAB] 90 tablet 0    Sig: TAKE 1 TABLET BY MOUTH EVERY DAY     Cardiovascular: Calcium Channel Blockers 2 Passed - 01/14/2022  9:09 AM      Passed - Last BP in normal range    BP Readings from Last 1 Encounters:  11/30/21 123/81         Passed - Last Heart Rate in normal range    Pulse Readings from Last 1 Encounters:  11/30/21 72         Passed - Valid encounter within last 6 months    Recent Outpatient Visits          1 month ago Type 2 diabetes mellitus with diabetic neuropathy, without long-term current use of insulin ([place])   [place], [name] B, MD   5 months ago Type 2 diabetes mellitus with diabetic neuropathy, without long-term current use of insulin ([place])   [place] Karle Plumber B, MD   1 year ago Type 2 diabetes mellitus with complication, without long-term current use of insulin ([place])   [place], Vernia Buff, NP   1 year ago Well female exam with routine gynecological exam   [place], MD   2 years ago Diabetic polyneuropathy associated with type 2 diabetes mellitus [facility])   [place] Antony Blackbird, MD      Future Appointments            In 2 months [place], Dalbert [place], MD [place]

## 2022-01-14 NOTE — Telephone Encounter (Signed)
Requested Prescriptions  Pending Prescriptions Disp Refills   omeprazole (PRILOSEC) 40 MG capsule [Pharmacy Med Name: OMEPRAZOLE DR 40 MG CAPSULE] 90 capsule 0    Sig: TAKE 1 CAPSULE (40 MG TOTAL) BY MOUTH DAILY.     Gastroenterology: Proton Pump Inhibitors Passed - 01/14/2022  9:09 AM      Passed - Valid encounter within last 12 months    Recent Outpatient Visits          1 month ago Type 2 diabetes mellitus with diabetic neuropathy, without long-term current use of insulin (HCC)   [facility] Jonah Blue B, MD   5 months ago Type 2 diabetes mellitus with diabetic neuropathy, without long-term current use of insulin (HCC)   [facility] Marcine Matar, MD   1 year ago Type 2 diabetes mellitus with complication, without long-term current use of insulin (HCC)   [facility] [place], Shea Stakes, NP   1 year ago Well female exam with routine gynecological exam   [facility] Fulp, [place], MD   2 years ago Diabetic polyneuropathy associated with type 2 diabetes mellitus (HCC)   [facility] [place], [place], MD      Future Appointments            In 2 months Marcine Matar, MD [facility]            ferrous sulfate 325 (65 FE) MG tablet [Pharmacy Med Name: FERROUS SULFATE 325 MG TABLET] 90 tablet 0    Sig: TAKE 1 TABLET BY MOUTH EVERY DAY AFTER A MEAL     Endocrinology:  Minerals - Iron Supplementation Failed - 01/14/2022  9:09 AM      Failed - Fe (serum) in normal range and within 360 days    Iron  Date Value Ref Range Status  08/03/2009 149 (H) 42 - 145 mcg/dL Final   Saturation Ratios  Date Value Ref Range Status  08/03/2009 44 20 - 55 % Final         Failed - Ferritin in normal range and within 360 days    No results found for: FERRITIN       Passed - HGB in normal range and within 360 days     Hemoglobin  Date Value Ref Range Status  07/30/2021 12.7 11.1 - 15.9 g/dL Final         Passed - HCT in normal range and within 360 days    Hematocrit  Date Value Ref Range Status  07/30/2021 36.4 34.0 - 46.6 % Final         Passed - RBC in normal range and within 360 days    RBC  Date Value Ref Range Status  07/30/2021 3.90 3.77 - 5.28 x10E6/uL Final  07/07/2018 3.72 (L) 3.87 - 5.11 MIL/uL Final         Passed - Valid encounter within last 12 months    Recent Outpatient Visits          1 month ago Type 2 diabetes mellitus with diabetic neuropathy, without long-term current use of insulin (HCC)   [place] [facility] [place], Gavin Pound B, MD   5 months ago Type 2 diabetes mellitus with diabetic neuropathy, without long-term current use of insulin [facility])   [facility] Marcine Matar, MD   1 year ago Type  2 diabetes mellitus with complication, without long-term current use of insulin ([place])   [place], Vernia Buff, NP   1 year ago Well female exam with routine gynecological exam   [place] Fulp, [place], MD   2 years ago Diabetic polyneuropathy associated with type 2 diabetes mellitus ([place])   [place], [place], MD      Future Appointments            In 2 months Ladell Pier, MD [place]            amLODipine (NORVASC) 10 MG tablet [Pharmacy Med Name: AMLODIPINE BESYLATE 10 MG TAB] 90 tablet 0    Sig: TAKE 1 TABLET BY MOUTH EVERY DAY     Cardiovascular: Calcium Channel Blockers 2 Passed - 01/14/2022  9:09 AM      Passed - Last BP in normal range    BP Readings from Last 1 Encounters:  11/30/21 123/81         Passed - Last Heart Rate in normal range    Pulse Readings from Last 1 Encounters:  11/30/21 72         Passed - Valid encounter within last 6 months    Recent  Outpatient Visits          1 month ago Type 2 diabetes mellitus with diabetic neuropathy, without long-term current use of insulin ([place])   [place], [name] B, MD   5 months ago Type 2 diabetes mellitus with diabetic neuropathy, without long-term current use of insulin ([place])   [place] Ladell Pier, MD   1 year ago Type 2 diabetes mellitus with complication, without long-term current use of insulin ([place])   [place], Vernia Buff, NP   1 year ago Well female exam with routine gynecological exam   [place] Fulp, [place], MD   2 years ago Diabetic polyneuropathy associated with type 2 diabetes mellitus ([place])   [place], [place], MD      Future Appointments            In 2 months Ladell Pier, MD [place]           Signed Prescriptions Disp Refills   furosemide (LASIX) 20 MG tablet 90 tablet 0    Sig: TAKE 1 TABLET BY MOUTH EVERY DAY     Cardiovascular:  Diuretics - Loop Failed - 01/14/2022  9:09 AM      Failed - Mg Level in normal range and within 180 days    Magnesium  Date Value Ref Range Status  03/19/2014 2.3 1.5 - 2.5 mg/dL Final         Passed - K in normal range and within 180 days    Potassium  Date Value Ref Range Status  07/30/2021 4.1 3.5 - 5.2 mmol/L Final         Passed - Ca in normal range and within 180 days    Calcium  Date Value Ref Range Status  07/30/2021 10.3 8.7 - 10.3 mg/dL Final   Calcium, Ion  Date Value Ref Range Status  03/18/2014 1.22 1.12 - 1.23 mmol/L Final         Passed - Na in normal range and within 180  days    Sodium  Date Value Ref Range Status  07/30/2021 142 134 - 144 mmol/L Final         Passed - Cr in normal range and within 180 days    Creat  Date Value Ref Range Status  08/18/2016 0.64 0.50 - 1.05  mg/dL Final    Comment:      For patients > or = 62 years of age: The upper reference limit for Creatinine is approximately 13% higher for people identified as African-American.      Creatinine, Ser  Date Value Ref Range Status  07/30/2021 0.82 0.57 - 1.00 mg/dL Final   Creatinine, POC  Date Value Ref Range Status  04/26/2017 50 mg/dL Final   Creatinine, Urine  Date Value Ref Range Status  12/10/2014 38.8 mg/dL Final    Comment:    No reference range established.         Passed - Cl in normal range and within 180 days    Chloride  Date Value Ref Range Status  07/30/2021 101 96 - 106 mmol/L Final         Passed - Last BP in normal range    BP Readings from Last 1 Encounters:  11/30/21 123/81         Passed - Valid encounter within last 6 months    Recent Outpatient Visits          1 month ago Type 2 diabetes mellitus with diabetic neuropathy, without long-term current use of insulin ([place])   [place], [name] B, MD   5 months ago Type 2 diabetes mellitus with diabetic neuropathy, without long-term current use of insulin ([place])   [place] Karle Plumber B, MD   1 year ago Type 2 diabetes mellitus with complication, without long-term current use of insulin ([place])   [place], Vernia Buff, NP   1 year ago Well female exam with routine gynecological exam   [place] Fulp, [place], MD   2 years ago Diabetic polyneuropathy associated with type 2 diabetes mellitus [facility])   [place] Antony Blackbird, MD      Future Appointments            In 2 months Wynetta Emery, Dalbert Batman, MD [place]

## 2022-01-14 NOTE — Telephone Encounter (Signed)
Requested Prescriptions  Pending Prescriptions Disp Refills   metFORMIN (GLUCOPHAGE) 1000 MG tablet [Pharmacy Med Name: METFORMIN HCL 1,000 MG TABLET] 180 tablet 1    Sig: TAKE 1 TABLET BY MOUTH 2 TIMES DAILY WITH A MEAL.     Endocrinology:  Diabetes - Biguanides Failed - 01/14/2022  9:09 AM      Failed - B12 Level in normal range and within 720 days    Vitamin B-12  Date Value Ref Range Status  10/30/2019 619 232 - 1,245 pg/mL Final         Failed - CBC within normal limits and completed in the last 12 months    WBC  Date Value Ref Range Status  07/30/2021 5.0 3.4 - 10.8 x10E3/uL Final  07/07/2018 3.9 (L) 4.0 - 10.5 K/uL Final   RBC  Date Value Ref Range Status  07/30/2021 3.90 3.77 - 5.28 x10E6/uL Final  07/07/2018 3.72 (L) 3.87 - 5.11 MIL/uL Final   Hemoglobin  Date Value Ref Range Status  07/30/2021 12.7 11.1 - 15.9 g/dL Final   Hematocrit  Date Value Ref Range Status  07/30/2021 36.4 34.0 - 46.6 % Final   MCHC  Date Value Ref Range Status  07/30/2021 34.9 31.5 - 35.7 g/dL Final  07/07/2018 31.7 30.0 - 36.0 g/dL Final   [facility]  Date Value Ref Range Status  07/30/2021 32.6 26.6 - 33.0 pg Final  07/07/2018 32.3 26.0 - 34.0 pg Final   MCV  Date Value Ref Range Status  07/30/2021 93 79 - 97 fL Final   No results found for: PLTCOUNTKUC, LABPLAT, POCPLA RDW  Date Value Ref Range Status  07/30/2021 12.0 11.7 - 15.4 % Final         Passed - Cr in normal range and within 360 days    Creat  Date Value Ref Range Status  08/18/2016 0.64 0.50 - 1.05 mg/dL Final    Comment:      For patients > or = 62 years of age: The upper reference limit for Creatinine is approximately 13% higher for people identified as African-American.      Creatinine, Ser  Date Value Ref Range Status  07/30/2021 0.82 0.57 - 1.00 mg/dL Final   Creatinine, POC  Date Value Ref Range Status  04/26/2017 50 mg/dL Final   Creatinine, Urine  Date Value Ref Range Status  12/10/2014 38.8  mg/dL Final    Comment:    No reference range established.         Passed - HBA1C is between 0 and 7.9 and within 180 days    HbA1c, POC (controlled diabetic range)  Date Value Ref Range Status  11/30/2021 6.9 0.0 - 7.0 % Final         Passed - eGFR in normal range and within 360 days    GFR, Est African American  Date Value Ref Range Status  08/18/2016 >89 >=60 mL/min Final   GFR calc Af Amer  Date Value Ref Range Status  11/09/2020 94 >59 mL/min/1.73 Final    Comment:    **In accordance with recommendations from the NKF-ASN Task force,**   [facility] is in the process of updating its eGFR calculation to the   2021 CKD-EPI creatinine equation that estimates kidney function   without a race variable.    GFR, Est Non African American  Date Value Ref Range Status  08/18/2016 >89 >=60 mL/min Final   GFR calc non Af Amer  Date Value Ref Range Status  11/09/2020 82 >  59 mL/min/1.73 Final   GFR  Date Value Ref Range Status  10/30/2013 120.29 >60.00 mL/min Final   eGFR  Date Value Ref Range Status  07/30/2021 81 >59 mL/min/1.73 Final         Passed - Valid encounter within last 6 months    Recent Outpatient Visits          1 month ago Type 2 diabetes mellitus with diabetic neuropathy, without long-term current use of insulin ([place])   [place], [name] B, MD   5 months ago Type 2 diabetes mellitus with diabetic neuropathy, without long-term current use of insulin ([place])   [place] Karle Plumber B, MD   1 year ago Type 2 diabetes mellitus with complication, without long-term current use of insulin ([place])   [place], Vernia Buff, NP   1 year ago Well female exam with routine gynecological exam   [place] Fulp, [place], MD   2 years ago Diabetic polyneuropathy associated with type 2 diabetes mellitus [facility])   [place] Antony Blackbird, MD      Future Appointments            In 2 months Wynetta Emery, Dalbert Batman, MD [place]

## 2022-01-14 NOTE — Telephone Encounter (Signed)
Requested Prescriptions  Pending Prescriptions Disp Refills   fenofibrate (TRICOR) 145 MG tablet [Pharmacy Med Name: FENOFIBRATE 145 MG TABLET] 90 tablet 1    Sig: TAKE 1 TABLET BY MOUTH EVERY DAY     Cardiovascular:  Antilipid - Fibric Acid Derivatives Failed - 01/14/2022  9:09 AM      Failed - Lipid Panel in normal range within the last 12 months    Cholesterol, Total  Date Value Ref Range Status  07/30/2021 134 100 - 199 mg/dL Final   LDL Chol Calc (NIH)  Date Value Ref Range Status  07/30/2021 63 0 - 99 mg/dL Final   HDL  Date Value Ref Range Status  07/30/2021 52 >39 mg/dL Final   Triglycerides  Date Value Ref Range Status  07/30/2021 104 0 - 149 mg/dL Final         Passed - ALT in normal range and within 360 days    ALT  Date Value Ref Range Status  07/30/2021 16 0 - 32 IU/L Final         Passed - AST in normal range and within 360 days    AST  Date Value Ref Range Status  07/30/2021 22 0 - 40 IU/L Final         Passed - Cr in normal range and within 360 days    Creat  Date Value Ref Range Status  08/18/2016 0.64 0.50 - 1.05 mg/dL Final    Comment:      For patients > or = 62 years of age: The upper reference limit for Creatinine is approximately 13% higher for people identified as African-American.      Creatinine, Ser  Date Value Ref Range Status  07/30/2021 0.82 0.57 - 1.00 mg/dL Final   Creatinine, POC  Date Value Ref Range Status  04/26/2017 50 mg/dL Final   Creatinine, Urine  Date Value Ref Range Status  12/10/2014 38.8 mg/dL Final    Comment:    No reference range established.         Passed - HGB in normal range and within 360 days    Hemoglobin  Date Value Ref Range Status  07/30/2021 12.7 11.1 - 15.9 g/dL Final         Passed - HCT in normal range and within 360 days    Hematocrit  Date Value Ref Range Status  07/30/2021 36.4 34.0 - 46.6 % Final         Passed - PLT in normal range and within 360 days    Platelets  Date  Value Ref Range Status  07/30/2021 208 150 - 450 x10E3/uL Final         Passed - WBC in normal range and within 360 days    WBC  Date Value Ref Range Status  07/30/2021 5.0 3.4 - 10.8 x10E3/uL Final  07/07/2018 3.9 (L) 4.0 - 10.5 K/uL Final         Passed - eGFR is 30 or above and within 360 days    GFR, Est African American  Date Value Ref Range Status  08/18/2016 >89 >=60 mL/min Final   GFR calc Af Amer  Date Value Ref Range Status  11/09/2020 94 >59 mL/min/1.73 Final    Comment:    **In accordance with recommendations from the NKF-ASN Task force,**   [facility] is in the process of updating its eGFR calculation to the   2021 CKD-EPI creatinine equation that estimates kidney function   without a race variable.  GFR, Est Non African American  Date Value Ref Range Status  08/18/2016 >89 >=60 mL/min Final   GFR calc non Af Amer  Date Value Ref Range Status  11/09/2020 82 >59 mL/min/1.73 Final   GFR  Date Value Ref Range Status  10/30/2013 120.29 >60.00 mL/min Final   eGFR  Date Value Ref Range Status  07/30/2021 81 >59 mL/min/1.73 Final         Passed - Valid encounter within last 12 months    Recent Outpatient Visits          1 month ago Type 2 diabetes mellitus with diabetic neuropathy, without long-term current use of insulin ([place])   [place], [name] B, MD   5 months ago Type 2 diabetes mellitus with diabetic neuropathy, without long-term current use of insulin ([place])   [place] Karle Plumber B, MD   1 year ago Type 2 diabetes mellitus with complication, without long-term current use of insulin ([place])   [place], Vernia Buff, NP   1 year ago Well female exam with routine gynecological exam   [place], MD   2 years ago Diabetic polyneuropathy associated with type 2 diabetes mellitus [facility])   [place] Antony Blackbird, MD      Future Appointments            In 2 months Wynetta Emery, Dalbert Batman, MD [place]

## 2022-01-21 ENCOUNTER — Other Ambulatory Visit: Payer: Self-pay | Admitting: Family Medicine

## 2022-01-21 DIAGNOSIS — I10 Essential (primary) hypertension: Secondary | ICD-10-CM

## 2022-01-21 NOTE — Telephone Encounter (Signed)
Requested Prescriptions  ?Pending Prescriptions Disp Refills  ?? dabigatran (PRADAXA) 150 MG CAPS capsule [Pharmacy Med Name: PRADAXA 150 MG CAPSULE] 180 capsule 1  ?  Sig: TAKE 1 CAPSULE BY MOUTH TWICE A DAY  ?  ? Hematology:  Anticoagulants 2 Passed - 01/21/2022  1:48 PM  ?  ?  Passed - PLT in normal range and within 360 days  ?  Platelets  ?Date Value Ref Range Status  ?07/30/2021 208 150 - 450 x10E3/uL Final  ?   ?  ?  Passed - HGB in normal range and within 360 days  ?  Hemoglobin  ?Date Value Ref Range Status  ?07/30/2021 12.7 11.1 - 15.9 g/dL Final  ?   ?  ?  Passed - HCT in normal range and within 360 days  ?  Hematocrit  ?Date Value Ref Range Status  ?07/30/2021 36.4 34.0 - 46.6 % Final  ?   ?  ?  Passed - Cr in normal range and within 360 days  ?  Creat  ?Date Value Ref Range Status  ?08/18/2016 0.64 0.50 - 1.05 mg/dL Final  ?  Comment:  ?    ?For patients > or = 62 years of age: The upper reference limit for ?Creatinine is approximately 13% higher for people identified as ?African-American. ?  ?  ? ?Creatinine, Ser  ?Date Value Ref Range Status  ?07/30/2021 0.82 0.57 - 1.00 mg/dL Final  ? ?Creatinine, POC  ?Date Value Ref Range Status  ?04/26/2017 50 mg/dL Final  ? ?Creatinine, Urine  ?Date Value Ref Range Status  ?12/10/2014 38.8 mg/dL Final  ?  Comment:  ?  No reference range established.  ?   ?  ?  Passed - AST in normal range and within 360 days  ?  AST  ?Date Value Ref Range Status  ?07/30/2021 22 0 - 40 IU/L Final  ?   ?  ?  Passed - ALT in normal range and within 360 days  ?  ALT  ?Date Value Ref Range Status  ?07/30/2021 16 0 - 32 IU/L Final  ?   ?  ?  Passed - eGFR is 10 or above and within 360 days  ?  GFR, Est African American  ?Date Value Ref Range Status  ?08/18/2016 >89 >=60 mL/min Final  ? ?GFR calc Af Wyvonnia Lora  ?Date Value Ref Range Status  ?11/09/2020 94 >59 mL/min/1.73 Final  ?  Comment:  ?  **In accordance with recommendations from the NKF-ASN Task force,** ?  [facility] is in the process of  updating its eGFR calculation to the ?  2021 CKD-EPI creatinine equation that estimates kidney function ?  without a race variable. ?  ? ?GFR, Est Non African American  ?Date Value Ref Range Status  ?08/18/2016 >89 >=60 mL/min Final  ? ?GFR calc non Af Amer  ?Date Value Ref Range Status  ?11/09/2020 82 >59 mL/min/1.73 Final  ? ?GFR  ?Date Value Ref Range Status  ?10/30/2013 120.29 >60.00 mL/min Final  ? ?eGFR  ?Date Value Ref Range Status  ?07/30/2021 81 >59 mL/min/1.73 Final  ?   ?  ?  Passed - Patient is not pregnant  ?  ?  Passed - Valid encounter within last 12 months  ?  Recent Outpatient Visits   ?      ? 1 month ago Type 2 diabetes mellitus with diabetic neuropathy, without long-term current use of insulin ([place])  ? [place] Karle Plumber  B, MD  ? 5 months ago Type 2 diabetes mellitus with diabetic neuropathy, without long-term current use of insulin ([place])  ? [place] Ladell Pier, MD  ? 1 year ago Type 2 diabetes mellitus with complication, without long-term current use of insulin ([place])  ? [place], Vernia Buff, NP  ? 1 year ago Well female exam with routine gynecological exam  ? [facility] Fulp, [place], MD  ? 2 years ago Diabetic polyneuropathy associated with type 2 diabetes mellitus ([place])  ? [place] Antony Blackbird, MD  ?  ?  ?Future Appointments   ?        ? In 2 months Ladell Pier, MD [place]  ?  ? ?  ?  ?  ? ? ?

## 2022-02-03 ENCOUNTER — Other Ambulatory Visit: Payer: Self-pay

## 2022-02-03 ENCOUNTER — Encounter (HOSPITAL_COMMUNITY): Payer: Self-pay | Admitting: *Deleted

## 2022-02-03 ENCOUNTER — Ambulatory Visit (HOSPITAL_COMMUNITY)
Admission: EM | Admit: 2022-02-03 | Discharge: 2022-02-03 | Disposition: A | Discharge status: Home / Self Care | Payer: Medicaid Other | Attending: Student | Admitting: Student

## 2022-02-03 DIAGNOSIS — H6122 Impacted cerumen, left ear: Secondary | ICD-10-CM | POA: Diagnosis not present

## 2022-02-03 DIAGNOSIS — K047 Periapical abscess without sinus: Secondary | ICD-10-CM | POA: Diagnosis not present

## 2022-02-03 MED ORDER — AMOXICILLIN-POT CLAVULANATE 875-125 MG PO TABS: 1.0000 | ORAL_TABLET | Freq: Two times a day (BID) | ORAL | 0 refills | Status: DC

## 2022-02-03 NOTE — ED Triage Notes (Signed)
Pt reports Lt ear pain and a broken tooth on Lt side. Pt is asking for a dental referral. ?

## 2022-02-03 NOTE — Discharge Instructions (Addendum)
-  Start the antibiotic-Augmentin (amoxicillin-clavulanate), 1 pill every 12 hours for 7 days.  You can take this with food like with breakfast and dinner. ?-you can try the drops for wax that you have at home. ?-You can take Tylenol up to 1000 mg 3 times daily, and ibuprofen up to 600 mg 3 times daily with food.  You can take these together, or alternate every 3-4 hours. ?-Follow-up with dentist - information later in paperwork ?

## 2022-02-03 NOTE — ED Provider Notes (Signed)
?[place] [facility] ? ? ? ?CSN: [id number] ?Arrival date & time: 02/03/22  1306 ? ? ?  ? ?History   ?Chief Complaint ?Chief Complaint  ?Patient presents with  ? Otalgia  ? broken tooth  ? ? ?HPI ?Jane Arellano is a 61 y.o. female presenting with dental pain, left-sided.  History.  Describes pain and swelling left upper molars, with pain radiating to the left ear.  Denies foul taste in mouth, pain under tongue, pain under jaw, throat pain, trouble swallowing, fever/chills, hearing changes, dizziness. ? ?HPI ? ?Past Medical History:  ?Diagnosis Date  ? Atypical chest pain   ? a. 11/2005 Negative Myoview  ? Diastolic dysfunction   ? DM2 (diabetes mellitus, type 2) ([place])   ? Dysrhythmia   ? AFIB HX CV   ? GERD (gastroesophageal reflux disease)   ? Heart murmur   ? History of substance abuse ([place])   ? HTN (hypertension)   ? Iron deficiency anemia   ? Paroxysmal atrial fibrillation ([place]) 05/01/2013  ? On Xarelto  ? Subaortic membrane   ? a. 01/2010 Echo: EF 55-60%, No rwma, subaortic membrane with elevated LVOT mean gradient of 21 mmHg, Triv AI, mod dil LA, mildly increased PASP. b. 05/02/2013 Echo:  LVEF 65-03%, grade 1 diastolic dysfunction, mild LVH, subaortic stenosis w/ turbulation in LVOT c/w subaortic membrane (mean gradient 42 mmHg/peak gradient 81 mmHg), mild biatrial enlargement  ? ? ?Patient Active Problem List  ? Diagnosis Date Noted  ? Secondary hypercoagulable state ([place]) 11/30/2021  ? Dyslipidemia 10/25/2017  ? Pap smear for cervical cancer screening 08/18/2016  ? Diabetic polyneuropathy associated with type 2 diabetes mellitus ([place]) 01/14/2016  ? Diabetic eye exam ([place]) 01/14/2016  ? Screening for colorectal cancer 01/14/2016  ? Neck pain 06/29/2015  ? Type 2 diabetes mellitus with complication, without long-term current use of insulin ([place]) 03/16/2015  ? Essential hypertension 03/16/2015  ? BV (bacterial vaginosis) 12/11/2014  ? Screening for colon cancer 12/10/2014  ? Keloid scar 12/10/2014  ? Screening  for STD (sexually transmitted disease) 12/10/2014  ? Onychomycosis of toenail 12/10/2014  ? Cerumen impaction 12/10/2014  ? Diabetes mellitus type 2 with neurological manifestations ([place]) 08/14/2014  ? Blurry vision, bilateral 08/14/2014  ? PAF (paroxysmal atrial fibrillation) ([place]) 05/08/2013  ? ETOH abuse 05/02/2013  ? Marijuana abuse 05/02/2013  ? Diastolic dysfunction 54/65/6812  ? Depression with anxiety 05/01/2013  ? Subaortic membrane   ? HTN (hypertension)   ? GERD 04/30/2007  ? ? ?Past Surgical History:  ?Procedure Laterality Date  ? CARDIOVERSION N/A 11/04/2013  ? Procedure: CARDIOVERSION;  Surgeon: Peter M Martinique, MD;  Location: [facility] ENDOSCOPY;  Service: Cardiovascular;  Laterality: N/A;  ? CESAREAN SECTION    ? ? ?OB History   ?No obstetric history on file. ?  ? ? ? ?Home Medications   ? ?Prior to Admission medications   ?Medication Sig Start Date End Date Taking? Authorizing Provider  ?amoxicillin-clavulanate (AUGMENTIN) 875-125 MG tablet Take 1 tablet by mouth every 12 (twelve) hours. 02/03/22  Yes Hazel Sams, PA-C  ?carvedilol (COREG) 3.125 MG tablet TAKE 1 TABLET BY MOUTH TWICE A DAY 10/07/21   Ladell Pier, MD  ?Accu-Chek FastClix Lancets MISC USE AS DIRECTED 07/30/21   Ladell Pier, MD  ?alum & mag hydroxide-simeth (MAALOX/MYLANTA) 200-200-20 MG/5ML suspension Take 15 mLs by mouth every 6 (six) hours as needed for indigestion or heartburn. 10/30/19   Fulp, Cammie, MD  ?amitriptyline (ELAVIL) 50 MG tablet TAKE 1 TABLET BY  MOUTH EVERYDAY AT BEDTIME 09/01/21   Ladell Pier, MD  ?amLODipine (NORVASC) 10 MG tablet TAKE 1 TABLET BY MOUTH EVERY DAY 01/14/22   Ladell Pier, MD  ?Blood Glucose Calibration (ACCU-CHEK AVIVA) SOLN USE AS INSTRUCTED 01/11/16   Tresa Garter, MD  ?Blood Glucose Monitoring Suppl (ACCU-CHEK AVIVA PLUS) w/Device KIT USE AS DIRECTED TWICE DAILY AT 8 AM AND 10 PM 01/04/16   Tresa Garter, MD  ?Calcium Carb-Cholecalciferol (CALCIUM-VITAMIN D3) 500-400  MG-UNIT TABS Take 1 tablet by mouth daily. 04/11/18   Argentina Donovan, PA-C  ?cetirizine (ZYRTEC) 10 MG tablet TAKE 1 TABLET (10 MG TOTAL) BY MOUTH DAILY. AS NEEDED FOR NASAL CONGESTION 09/19/20   Fulp, Cammie, MD  ?dabigatran (PRADAXA) 150 MG CAPS capsule TAKE 1 CAPSULE BY MOUTH TWICE A DAY 01/21/22   Ladell Pier, MD  ?famotidine (PEPCID) 40 MG tablet Take 1 tablet (40 mg total) by mouth daily. 11/10/21   Geryl Councilman L, PA  ?fenofibrate (TRICOR) 145 MG tablet TAKE 1 TABLET BY MOUTH EVERY DAY 01/14/22   Ladell Pier, MD  ?ferrous sulfate 325 (65 FE) MG tablet TAKE 1 TABLET BY MOUTH EVERY DAY AFTER A MEAL 01/14/22   Ladell Pier, MD  ?furosemide (LASIX) 20 MG tablet TAKE 1 TABLET BY MOUTH EVERY DAY 01/14/22   Ladell Pier, MD  ?gabapentin (NEURONTIN) 600 MG tablet Take 1 tablet (600 mg total) by mouth 3 (three) times daily. 07/30/21   Ladell Pier, MD  ?glucose blood (ACCU-CHEK AVIVA PLUS) test strip Check BS 1-2 times a day 07/30/21   Ladell Pier, MD  ?hydrOXYzine (ATARAX/VISTARIL) 25 MG tablet TAKE 1 TABLET BY MOUTH 3 TIMES DAILY AS NEEDED FOR ANXIETY. 12/21/20   Gildardo Pounds, NP  ?metFORMIN (GLUCOPHAGE) 1000 MG tablet TAKE 1 TABLET BY MOUTH 2 TIMES DAILY WITH A MEAL. 01/14/22   Ladell Pier, MD  ?Multiple Vitamin (MULTIVITAMIN WITH MINERALS) TABS tablet Take 1 tablet by mouth daily.    [provider]  ?naproxen (NAPROSYN) 375 MG tablet Take 1 tablet (375 mg total) by mouth 2 (two) times daily. 11/08/21   Regan Lemming, MD  ?Omega-3 Fatty Acids (FISH OIL) 1000 MG CPDR Take 3 g by mouth daily. ?Patient taking differently: Take 2 capsules by mouth daily. 04/11/18   Argentina Donovan, PA-C  ?omeprazole (PRILOSEC) 40 MG capsule TAKE 1 CAPSULE (40 MG TOTAL) BY MOUTH DAILY. 01/14/22   Ladell Pier, MD  ?polyethylene glycol powder (GAVILAX) 17 GM/SCOOP powder MIX 1 CAPFUL WITH LIQUID AND DRINK ONCE DAILY 10/30/19   Fulp, Cammie, MD  ?potassium chloride  (K-DUR,KLOR-CON) 10 MEQ tablet Take 1 tablet (10 mEq total) by mouth daily. 11/06/13 04/24/19  Tresa Garter, MD  ? ? ?Family History ?Family History  ?Problem Relation Age of Onset  ? Diabetes Father   ?     deceased  ? Cancer Mother 74  ?     colon  ? Stroke Mother   ? Hypertension Sister   ?     alive and well  ? Hypertension Sister   ?     alive and well  ? Heart attack Brother   ?     deceased @ 104  ? ? ?Social History ?Social History  ? ?Tobacco Use  ? Smoking status: Never  ? Smokeless tobacco: Current  ?  Types: Snuff  ?Vaping Use  ? Vaping Use: Never used  ?Substance Use Topics  ? Alcohol use:  Yes  ?  Alcohol/week: 2.0 - 3.0 standard drinks  ?  Types: 2 - 3 Glasses of wine per week  ? Drug use: No  ?  Types: Marijuana  ?  Comment: Smokes marijuana weekly.  Has not used cocaine in 9 years.  ? ? ? ?Allergies   ?Xarelto [rivaroxaban] and Ketorolac tromethamine ? ? ?Review of Systems ?Review of Systems  ?HENT:  Positive for dental problem.   ?All other systems reviewed and are negative. ? ? ?Physical Exam ?Triage Vital Signs ?ED Triage Vitals  ?Enc Vitals Group  ?   BP 02/03/22 1429 126/79  ?   Pulse Rate 02/03/22 1429 (!) 52  ?   Resp 02/03/22 1429 18  ?   Temp 02/03/22 1429 97.9 ?F (36.6 ?C)  ?   Temp src --   ?   SpO2 02/03/22 1429 99 %  ?   Weight --   ?   Height --   ?   Head Circumference --   ?   Peak Flow --   ?   Pain Score 02/03/22 1427 6  ?   Pain Loc --   ?   Pain Edu? --   ?   Excl. in [place]? --   ? ?No data found. ? ?Updated Vital Signs ?BP 126/79   Pulse (!) 52   Temp 97.9 ?F (36.6 ?C)   Resp 18   SpO2 99%  ? ?Visual Acuity ?Right Eye Distance:   ?Left Eye Distance:   ?Bilateral Distance:   ? ?Right Eye Near:   ?Left Eye Near:    ?Bilateral Near:    ? ?Physical Exam ?Vitals reviewed.  ?Constitutional:   ?   General: She is not in acute distress. ?   Appearance: Normal appearance. She is not ill-appearing, toxic-appearing or diaphoretic.  ?HENT:  ?   Head: Normocephalic and atraumatic.  ?    Jaw: There is normal jaw occlusion. No trismus, tenderness, swelling, pain on movement or malocclusion.  ?   Salivary Glands: Right salivary gland is not diffusely enlarged or tender. Left salivary gland is not diffuse

## 2022-02-08 ENCOUNTER — Ambulatory Visit (HOSPITAL_COMMUNITY)
Admission: EM | Admit: 2022-02-08 | Discharge: 2022-02-08 | Disposition: A | Discharge status: Home / Self Care | Payer: Medicaid Other | Attending: Internal Medicine | Admitting: Internal Medicine

## 2022-02-08 ENCOUNTER — Encounter (HOSPITAL_COMMUNITY): Payer: Self-pay

## 2022-02-08 DIAGNOSIS — T50905A Adverse effect of unspecified drugs, medicaments and biological substances, initial encounter: Secondary | ICD-10-CM

## 2022-02-08 DIAGNOSIS — L298 Other pruritus: Secondary | ICD-10-CM

## 2022-02-08 MED ORDER — HYDROXYZINE HCL 25 MG PO TABS: 25.0000 mg | ORAL_TABLET | Freq: Three times a day (TID) | ORAL | 0 refills | Status: DC | PRN

## 2022-02-08 NOTE — Discharge Instructions (Addendum)
Please discontinue antibiotic use ?Take medications as prescribed ?If you develop any eye redness, oral ulcers or pain on urination please return to urgent care to be reevaluated. ?

## 2022-02-08 NOTE — ED Triage Notes (Signed)
Pt presents to what she thinks is an allergic reaction to amoxicillin. Reports itching and burning to skin.  ?

## 2022-02-09 NOTE — ED Provider Notes (Addendum)
?[facility] ? ? ? ?CSN: [id number] ?Arrival date & time: 02/08/22  1048 ? ? ?  ? ?History   ?Chief Complaint ?Chief Complaint  ?Patient presents with  ? Allergic Reaction  ? ? ?HPI ?Jane Arellano is a 61 y.o. female comes to the urgent care with a few days history of generalized itching as well as itchy rash on the right leg.  Patient was seen in the urgent care about 1 week ago for dental infection.  She was prescribed amoxicillin.  After starting amoxicillin patient developed generalized itching mainly on the scalp as well as the torso.  No rashes noted.  Patient denies any shortness of breath, chest tightness or wheezing.  No tongue swelling.  No oral ulcers or eye redness.  No dysuria.  Patient tried hydroxyzine with no improvement in her symptoms.  HPI ? ?Past Medical History:  ?Diagnosis Date  ? Atypical chest pain   ? a. 11/2005 Negative Myoview  ? Diastolic dysfunction   ? DM2 (diabetes mellitus, type 2) ([place])   ? Dysrhythmia   ? AFIB HX CV   ? GERD (gastroesophageal reflux disease)   ? Heart murmur   ? History of substance abuse ([place])   ? HTN (hypertension)   ? Iron deficiency anemia   ? Paroxysmal atrial fibrillation ([place]) 05/01/2013  ? On Xarelto  ? Subaortic membrane   ? a. 01/2010 Echo: EF 55-60%, No rwma, subaortic membrane with elevated LVOT mean gradient of 21 mmHg, Triv AI, mod dil LA, mildly increased PASP. b. 05/02/2013 Echo:  LVEF 33-35%, grade 1 diastolic dysfunction, mild LVH, subaortic stenosis w/ turbulation in LVOT c/w subaortic membrane (mean gradient 42 mmHg/peak gradient 81 mmHg), mild biatrial enlargement  ? ? ?Patient Active Problem List  ? Diagnosis Date Noted  ? Secondary hypercoagulable state ([place]) 11/30/2021  ? Dyslipidemia 10/25/2017  ? Pap smear for cervical cancer screening 08/18/2016  ? Diabetic polyneuropathy associated with type 2 diabetes mellitus ([place]) 01/14/2016  ? Diabetic eye exam ([place]) 01/14/2016  ? Screening for colorectal cancer 01/14/2016  ? Neck pain  06/29/2015  ? Type 2 diabetes mellitus with complication, without long-term current use of insulin ([place]) 03/16/2015  ? Essential hypertension 03/16/2015  ? BV (bacterial vaginosis) 12/11/2014  ? Screening for colon cancer 12/10/2014  ? Keloid scar 12/10/2014  ? Screening for STD (sexually transmitted disease) 12/10/2014  ? Onychomycosis of toenail 12/10/2014  ? Cerumen impaction 12/10/2014  ? Diabetes mellitus type 2 with neurological manifestations ([place]) 08/14/2014  ? Blurry vision, bilateral 08/14/2014  ? PAF (paroxysmal atrial fibrillation) ([place]) 05/08/2013  ? ETOH abuse 05/02/2013  ? Marijuana abuse 05/02/2013  ? Diastolic dysfunction 45/62/5638  ? Depression with anxiety 05/01/2013  ? Subaortic membrane   ? HTN (hypertension)   ? GERD 04/30/2007  ? ? ?Past Surgical History:  ?Procedure Laterality Date  ? CARDIOVERSION N/A 11/04/2013  ? Procedure: CARDIOVERSION;  Surgeon: Peter M Martinique, MD;  Location: [facility] ENDOSCOPY;  Service: Cardiovascular;  Laterality: N/A;  ? CESAREAN SECTION    ? ? ?OB History   ?No obstetric history on file. ?  ? ? ? ?Home Medications   ? ?Prior to Admission medications   ?Medication Sig Start Date End Date Taking? Authorizing Provider  ?carvedilol (COREG) 3.125 MG tablet TAKE 1 TABLET BY MOUTH TWICE A DAY 10/07/21   Ladell Pier, MD  ?hydrOXYzine (ATARAX) 25 MG tablet Take 1 tablet (25 mg total) by mouth every 8 (eight) hours as needed. 02/08/22  Yes Philbert Ocallaghan,  Myrene Galas, MD  ?Accu-Chek FastClix Lancets MISC USE AS DIRECTED 07/30/21   Ladell Pier, MD  ?alum & mag hydroxide-simeth (MAALOX/MYLANTA) 200-200-20 MG/5ML suspension Take 15 mLs by mouth every 6 (six) hours as needed for indigestion or heartburn. 10/30/19   Fulp, Cammie, MD  ?amitriptyline (ELAVIL) 50 MG tablet TAKE 1 TABLET BY MOUTH EVERYDAY AT BEDTIME 09/01/21   Ladell Pier, MD  ?amLODipine (NORVASC) 10 MG tablet TAKE 1 TABLET BY MOUTH EVERY DAY 01/14/22   Ladell Pier, MD  ?amoxicillin-clavulanate (AUGMENTIN)  875-125 MG tablet Take 1 tablet by mouth every 12 (twelve) hours. 02/03/22   Hazel Sams, PA-C  ?Blood Glucose Calibration (ACCU-CHEK AVIVA) SOLN USE AS INSTRUCTED 01/11/16   Tresa Garter, MD  ?Blood Glucose Monitoring Suppl (ACCU-CHEK AVIVA PLUS) w/Device KIT USE AS DIRECTED TWICE DAILY AT 8 AM AND 10 PM 01/04/16   Tresa Garter, MD  ?Calcium Carb-Cholecalciferol (CALCIUM-VITAMIN D3) 500-400 MG-UNIT TABS Take 1 tablet by mouth daily. 04/11/18   Argentina Donovan, PA-C  ?cetirizine (ZYRTEC) 10 MG tablet TAKE 1 TABLET (10 MG TOTAL) BY MOUTH DAILY. AS NEEDED FOR NASAL CONGESTION 09/19/20   Fulp, Cammie, MD  ?dabigatran (PRADAXA) 150 MG CAPS capsule TAKE 1 CAPSULE BY MOUTH TWICE A DAY 01/21/22   Ladell Pier, MD  ?famotidine (PEPCID) 40 MG tablet Take 1 tablet (40 mg total) by mouth daily. 11/10/21   Geryl Councilman L, PA  ?fenofibrate (TRICOR) 145 MG tablet TAKE 1 TABLET BY MOUTH EVERY DAY 01/14/22   Ladell Pier, MD  ?ferrous sulfate 325 (65 FE) MG tablet TAKE 1 TABLET BY MOUTH EVERY DAY AFTER A MEAL 01/14/22   Ladell Pier, MD  ?furosemide (LASIX) 20 MG tablet TAKE 1 TABLET BY MOUTH EVERY DAY 01/14/22   Ladell Pier, MD  ?gabapentin (NEURONTIN) 600 MG tablet Take 1 tablet (600 mg total) by mouth 3 (three) times daily. 07/30/21   Ladell Pier, MD  ?glucose blood (ACCU-CHEK AVIVA PLUS) test strip Check BS 1-2 times a day 07/30/21   Ladell Pier, MD  ?metFORMIN (GLUCOPHAGE) 1000 MG tablet TAKE 1 TABLET BY MOUTH 2 TIMES DAILY WITH A MEAL. 01/14/22   Ladell Pier, MD  ?Multiple Vitamin (MULTIVITAMIN WITH MINERALS) TABS tablet Take 1 tablet by mouth daily.    [provider]  ?naproxen (NAPROSYN) 375 MG tablet Take 1 tablet (375 mg total) by mouth 2 (two) times daily. 11/08/21   Regan Lemming, MD  ?Omega-3 Fatty Acids (FISH OIL) 1000 MG CPDR Take 3 g by mouth daily. ?Patient taking differently: Take 2 capsules by mouth daily. 04/11/18   Argentina Donovan, PA-C   ?omeprazole (PRILOSEC) 40 MG capsule TAKE 1 CAPSULE (40 MG TOTAL) BY MOUTH DAILY. 01/14/22   Ladell Pier, MD  ?polyethylene glycol powder (GAVILAX) 17 GM/SCOOP powder MIX 1 CAPFUL WITH LIQUID AND DRINK ONCE DAILY 10/30/19   Fulp, Cammie, MD  ?potassium chloride (K-DUR,KLOR-CON) 10 MEQ tablet Take 1 tablet (10 mEq total) by mouth daily. 11/06/13 04/24/19  Tresa Garter, MD  ? ? ?Family History ?Family History  ?Problem Relation Age of Onset  ? Diabetes Father   ?     deceased  ? Cancer Mother 37  ?     colon  ? Stroke Mother   ? Hypertension Sister   ?     alive and well  ? Hypertension Sister   ?     alive and well  ? Heart  attack Brother   ?     deceased @ 7  ? ? ?Social History ?Social History  ? ?Tobacco Use  ? Smoking status: Never  ? Smokeless tobacco: Current  ?  Types: Snuff  ?Vaping Use  ? Vaping Use: Never used  ?Substance Use Topics  ? Alcohol use: Yes  ?  Alcohol/week: 2.0 - 3.0 standard drinks  ?  Types: 2 - 3 Glasses of wine per week  ? Drug use: No  ?  Types: Marijuana  ?  Comment: Smokes marijuana weekly.  Has not used cocaine in 9 years.  ? ? ? ?Allergies   ?Xarelto [rivaroxaban], Amoxicillin, Ibuprofen, and Ketorolac tromethamine ? ? ?Review of Systems ?Review of Systems  ?Gastrointestinal: Negative.   ?Musculoskeletal: Negative.   ?Skin:  Positive for rash. Negative for color change.  ?Neurological: Negative.   ?Psychiatric/Behavioral: Negative.    ? ? ?Physical Exam ?Triage Vital Signs ?ED Triage Vitals  ?Enc Vitals Group  ?   BP 02/08/22 1242 122/78  ?   Pulse Rate 02/08/22 1242 (!) 56  ?   Resp 02/08/22 1242 19  ?   Temp 02/08/22 1242 98.2 ?F (36.8 ?C)  ?   Temp src --   ?   SpO2 02/08/22 1242 100 %  ?   Weight --   ?   Height --   ?   Head Circumference --   ?   Peak Flow --   ?   Pain Score 02/08/22 1241 0  ?   Pain Loc --   ?   Pain Edu? --   ?   Excl. in [place]? --   ? ?No data found. ? ?Updated Vital Signs ?BP 122/78   Pulse (!) 56   Temp 98.2 ?F (36.8 ?C)   Resp 19   SpO2 100%   ? ?Visual Acuity ?Right Eye Distance:   ?Left Eye Distance:   ?Bilateral Distance:   ? ?Right Eye Near:   ?Left Eye Near:    ?Bilateral Near:    ? ?Physical Exam ?Vitals reviewed.  ?Cardiovascular:  ?   Rate and Rhythm:

## 2022-03-09 ENCOUNTER — Inpatient Hospital Stay: Payer: Medicaid Other | Admitting: Physician Assistant

## 2022-03-31 ENCOUNTER — Ambulatory Visit: Payer: Medicaid Other | Attending: Internal Medicine | Admitting: Internal Medicine

## 2022-03-31 VITALS — BP 109/71 | HR 60 | Temp 98.1°F | Wt 180.0 lb

## 2022-03-31 DIAGNOSIS — E785 Hyperlipidemia, unspecified: Secondary | ICD-10-CM | POA: Diagnosis not present

## 2022-03-31 DIAGNOSIS — E1159 Type 2 diabetes mellitus with other circulatory complications: Secondary | ICD-10-CM

## 2022-03-31 DIAGNOSIS — K0889 Other specified disorders of teeth and supporting structures: Secondary | ICD-10-CM

## 2022-03-31 DIAGNOSIS — J302 Other seasonal allergic rhinitis: Secondary | ICD-10-CM | POA: Diagnosis not present

## 2022-03-31 DIAGNOSIS — D509 Iron deficiency anemia, unspecified: Secondary | ICD-10-CM

## 2022-03-31 DIAGNOSIS — I152 Hypertension secondary to endocrine disorders: Secondary | ICD-10-CM

## 2022-03-31 DIAGNOSIS — Z23 Encounter for immunization: Secondary | ICD-10-CM | POA: Diagnosis not present

## 2022-03-31 DIAGNOSIS — E1140 Type 2 diabetes mellitus with diabetic neuropathy, unspecified: Secondary | ICD-10-CM

## 2022-03-31 LAB — POCT GLYCOSYLATED HEMOGLOBIN (HGB A1C)
HbA1c POC (<> result, manual entry): 6.2 % (ref 4.0–5.6)
HbA1c, POC (controlled diabetic range): 6.2 % (ref 0.0–7.0)
HbA1c, POC (prediabetic range): 6.2 % (ref 5.7–6.4)
Hemoglobin A1C: 6.2 % — AB (ref 4.0–5.6)

## 2022-03-31 LAB — GLUCOSE, POCT (MANUAL RESULT ENTRY): POC Glucose: 163 mg/dl — AB (ref 70–99)

## 2022-03-31 MED ORDER — CETIRIZINE HCL 10 MG PO TABS: 10.0000 mg | ORAL_TABLET | Freq: Every day | ORAL | 1 refills | Status: DC | PRN

## 2022-03-31 NOTE — Patient Instructions (Signed)
You should call your insurance and have them send you a list of dentists in the area who are on your insurance plan.  ?Call [facility] to inquire whether you are due for repeat colonoscopy.  Your last colonoscopy was done July 2018.  It was normal.  However the specialist stated in the report that you will be due again in 5 years for screening purposes.  Their phone number is [phone]. ?

## 2022-03-31 NOTE — Progress Notes (Signed)
? ? ?Patient ID: Jane Arellano, female    DOB: [birth year]  MRN: [id number] ? ?CC: Dental Pain (Pt is here for tooth pain would like a refill for cetirizine,referral to the dentist and [facility]) ? ? ?Subjective: ?Jane Arellano is a 61 y.o. female who presents for chronic ds management ?Her concerns today include:  ?Patient with history of DM type II with neuropathy, HTN, HL, PAF, mild AS/mild to moderate AR  (Echo 02/2021)substance use cocaine (clean 15-20 yrs as of 11/2021), anemia, ? ?C/o dental pain off and on since 01/2022 ?Reports she has broken tooth in upper jaw.  She was seen in ER for it 01/2022 ?Needs to see dentist.  She had called her insurance and requested the names of dentists in the area under her plan.  She states that they listed a few names but she had difficulty understanding them.  She has tried calling several dental offices herself and was told that they do not accept her insurance. ? ?diabetes mellitus: ?Results for orders placed or performed in visit on 03/31/22  ?POCT glycosylated hemoglobin (Hb A1C)  ?Result Value Ref Range  ? Hemoglobin A1C 6.2 (A) 4.0 - 5.6 %  ? HbA1c POC (<> result, manual entry) 6.2 4.0 - 5.6 %  ? HbA1c, POC (prediabetic range) 6.2 5.7 - 6.4 %  ? HbA1c, POC (controlled diabetic range) 6.2 0.0 - 7.0 %  ?POCT glucose (manual entry)  ?Result Value Ref Range  ? POC Glucose 163 (A) 70 - 99 mg/dl  ?Reports compliance with Metformin ?Reports eating as healthy as she can ?Not as active as she should be but plans to start.  On gabapentin for diabetic neuropathy and doing okay on it. ? ?C/o allergy symptoms of sneezing, congestion and itchy eyes.  Worse when she cuts her grass.  Request RF on Zyrtec which worked well for her in past.  ? ?HYPERTENSION/PAF ?Currently taking: see medication list.  She confirms taking amlodipine 10 mg daily, Coreg 3.125 mg twice a day and furosemide 20 mg daily ?Med Adherence: _0  Yes    _1  No ?Medication side effects: _2  Yes    _3  No ?Adherence  with salt restriction: _4  Yes    _5  No ?Home Monitoring?: _6  Yes    _7  No ?Monitoring Frequency:  ?Home BP results range:  ?SOB? _8  Yes    _9  No ?Chest Pain?: _10  Yes    _11  No ?Leg swelling?: _12  Yes    _13  No ?Headaches?: _14  Yes    _15  No ?Dizziness? _16  Yes    _17  No ?Comments: no palpitations.  No bruising or bleeding ? ?HL: Reports compliance with taking Tricor.  Last LDL was 63. ? ?HM:  due for 2nd Shingrix. Has eye appt 04/20/2022 with [facility] ? ?Anemia: Last CBC nl.  We had dec iron to 3x/wk.  She reports taking it 3x/wk ?Patient Active Problem List  ? Diagnosis Date Noted  ? Secondary hypercoagulable state ([place]) 11/30/2021  ? Dyslipidemia 10/25/2017  ? Pap smear for cervical cancer screening 08/18/2016  ? Diabetic polyneuropathy associated with type 2 diabetes mellitus ([place]) 01/14/2016  ? Diabetic eye exam ([place]) 01/14/2016  ? Screening for colorectal cancer 01/14/2016  ? Neck pain 06/29/2015  ? Type 2 diabetes mellitus with complication, without long-term current use of insulin ([place]) 03/16/2015  ? Essential hypertension 03/16/2015  ? BV (bacterial vaginosis) 12/11/2014  ? Screening for colon cancer 12/10/2014  ? Keloid scar 12/10/2014  ? Screening for STD (sexually transmitted disease) 12/10/2014  ?  Onychomycosis of toenail 12/10/2014  ? Cerumen impaction 12/10/2014  ? Diabetes mellitus type 2 with neurological manifestations ([place]) 08/14/2014  ? Blurry vision, bilateral 08/14/2014  ? PAF (paroxysmal atrial fibrillation) ([place]) 05/08/2013  ? ETOH abuse 05/02/2013  ? Marijuana abuse 05/02/2013  ? Diastolic dysfunction 78/29/5621  ? Depression with anxiety 05/01/2013  ? Subaortic membrane   ? HTN (hypertension)   ? GERD 04/30/2007  ?  ? ?Current Outpatient Medications on File Prior to Visit  ?Medication Sig Dispense Refill  ? Accu-Chek FastClix Lancets MISC USE AS DIRECTED 100 each 11  ? alum & mag hydroxide-simeth (MAALOX/MYLANTA) 200-200-20 MG/5ML suspension Take 15 mLs by mouth every 6 (six) hours as  needed for indigestion or heartburn. 355 mL 5  ? amitriptyline (ELAVIL) 50 MG tablet TAKE 1 TABLET BY MOUTH EVERYDAY AT BEDTIME 90 tablet 0  ? amLODipine (NORVASC) 10 MG tablet TAKE 1 TABLET BY MOUTH EVERY DAY 90 tablet 0  ? Blood Glucose Calibration (ACCU-CHEK AVIVA) SOLN USE AS INSTRUCTED 1 each 0  ? Blood Glucose Monitoring Suppl (ACCU-CHEK AVIVA PLUS) w/Device KIT USE AS DIRECTED TWICE DAILY AT 8 AM AND 10 PM 1 kit 0  ? Calcium Carb-Cholecalciferol (CALCIUM-VITAMIN D3) 500-400 MG-UNIT TABS Take 1 tablet by mouth daily. 60 tablet 5  ? carvedilol (COREG) 3.125 MG tablet TAKE 1 TABLET BY MOUTH TWICE A DAY 180 tablet 0  ? dabigatran (PRADAXA) 150 MG CAPS capsule TAKE 1 CAPSULE BY MOUTH TWICE A DAY 180 capsule 0  ? famotidine (PEPCID) 40 MG tablet Take 1 tablet (40 mg total) by mouth daily. 30 tablet 0  ? fenofibrate (TRICOR) 145 MG tablet TAKE 1 TABLET BY MOUTH EVERY DAY 90 tablet 1  ? ferrous sulfate 325 (65 FE) MG tablet TAKE 1 TABLET BY MOUTH EVERY DAY AFTER A MEAL 90 tablet 0  ? furosemide (LASIX) 20 MG tablet TAKE 1 TABLET BY MOUTH EVERY DAY 90 tablet 0  ? gabapentin (NEURONTIN) 600 MG tablet Take 1 tablet (600 mg total) by mouth 3 (three) times daily. 90 tablet 6  ? glucose blood (ACCU-CHEK AVIVA PLUS) test strip Check BS 1-2 times a day 100 each 11  ? hydrOXYzine (ATARAX) 25 MG tablet Take 1 tablet (25 mg total) by mouth every 8 (eight) hours as needed. 30 tablet 0  ? metFORMIN (GLUCOPHAGE) 1000 MG tablet TAKE 1 TABLET BY MOUTH 2 TIMES DAILY WITH A MEAL. 180 tablet 1  ? Multiple Vitamin (MULTIVITAMIN WITH MINERALS) TABS tablet Take 1 tablet by mouth daily.    ? Omega-3 Fatty Acids (FISH OIL) 1000 MG CPDR Take 3 g by mouth daily. (Patient taking differently: Take 2 capsules by mouth daily.) 90 capsule 6  ? omeprazole (PRILOSEC) 40 MG capsule TAKE 1 CAPSULE (40 MG TOTAL) BY MOUTH DAILY. 90 capsule 0  ? polyethylene glycol powder (GAVILAX) 17 GM/SCOOP powder MIX 1 CAPFUL WITH LIQUID AND DRINK ONCE DAILY 476 g 6   ? [DISCONTINUED] potassium chloride (K-DUR,KLOR-CON) 10 MEQ tablet Take 1 tablet (10 mEq total) by mouth daily. 30 tablet 0  ? ?No current facility-administered medications on file prior to visit.  ? ? ?Allergies  ?Allergen Reactions  ? Xarelto [Rivaroxaban] Swelling  ?  Gums bled, headaches  ? Amoxicillin   ? Ibuprofen   ? Ketorolac Tromethamine Other (See Comments)  ?  Unknown reaction  ? ? ?Social History  ? ?Socioeconomic History  ? Marital status: Single  ?  Spouse name: Not on file  ? Number of children: Not  on file  ? Years of education: Not on file  ? Highest education level: Not on file  ?Occupational History  ? Not on file  ?Tobacco Use  ? Smoking status: Never  ? Smokeless tobacco: Current  ?  Types: Snuff  ?Vaping Use  ? Vaping Use: Never used  ?Substance and Sexual Activity  ? Alcohol use: Yes  ?  Alcohol/week: 2.0 - 3.0 standard drinks  ?  Types: 2 - 3 Glasses of wine per week  ? Drug use: No  ?  Types: Marijuana  ?  Comment: Smokes marijuana weekly.  Has not used cocaine in 9 years.  ? Sexual activity: Yes  ?  Birth control/protection: None  ?Other Topics Concern  ? Not on file  ?Social History Narrative  ? Lives in [place] by herself.  She had been caring for her mother but she died 2 mos ago.  She tries to remain active but does not regularly exercise.  ? ?Social Determinants of Health  ? ?Financial Resource Strain: Not on file  ?Food Insecurity: Not on file  ?Transportation Needs: Not on file  ?Physical Activity: Not on file  ?Stress: Not on file  ?Social Connections: Not on file  ?Intimate Partner Violence: Not on file  ? ? ?Family History  ?Problem Relation Age of Onset  ? Diabetes Father   ?     deceased  ? Cancer Mother 75  ?     colon  ? Stroke Mother   ? Hypertension Sister   ?     alive and well  ? Hypertension Sister   ?     alive and well  ? Heart attack Brother   ?     deceased @ 15  ? ? ?Past Surgical History:  ?Procedure Laterality Date  ? CARDIOVERSION N/A 11/04/2013  ? Procedure:  CARDIOVERSION;  Surgeon: Peter M Martinique, MD;  Location: [facility] ENDOSCOPY;  Service: Cardiovascular;  Laterality: N/A;  ? CESAREAN SECTION    ? ? ?ROS: ?Review of Systems ?Negative except as stated above ? ?PHYSICAL E

## 2022-04-01 LAB — CBC
Hematocrit: 37 % (ref 34.0–46.6)
Hemoglobin: 12.2 g/dL (ref 11.1–15.9)
MCH: 32.4 pg (ref 26.6–33.0)
MCHC: 33 g/dL (ref 31.5–35.7)
MCV: 98 fL — ABNORMAL HIGH (ref 79–97)
Platelets: 200 10*3/uL (ref 150–450)
RBC: 3.76 x10E6/uL — ABNORMAL LOW (ref 3.77–5.28)
RDW: 12.4 % (ref 11.7–15.4)
WBC: 5.7 10*3/uL (ref 3.4–10.8)

## 2022-04-04 ENCOUNTER — Inpatient Hospital Stay: Payer: Medicaid Other | Admitting: Internal Medicine

## 2022-04-18 ENCOUNTER — Other Ambulatory Visit: Payer: Self-pay | Admitting: Internal Medicine

## 2022-04-18 DIAGNOSIS — K219 Gastro-esophageal reflux disease without esophagitis: Secondary | ICD-10-CM

## 2022-04-19 ENCOUNTER — Other Ambulatory Visit: Payer: Self-pay | Admitting: Internal Medicine

## 2022-04-19 DIAGNOSIS — I10 Essential (primary) hypertension: Secondary | ICD-10-CM

## 2022-04-20 LAB — HM DIABETES EYE EXAM

## 2022-05-06 ENCOUNTER — Other Ambulatory Visit: Payer: Self-pay | Admitting: Internal Medicine

## 2022-05-06 DIAGNOSIS — I10 Essential (primary) hypertension: Secondary | ICD-10-CM

## 2022-05-06 MED ORDER — CARVEDILOL 3.125 MG PO TABS: 3.1250 mg | ORAL_TABLET | Freq: Two times a day (BID) | ORAL | 1 refills | Status: DC

## 2022-05-06 NOTE — Telephone Encounter (Signed)
Requested Prescriptions  Pending Prescriptions Disp Refills  . carvedilol (COREG) 3.125 MG tablet 180 tablet 1    Sig: Take 1 tablet (3.125 mg total) by mouth 2 (two) times daily.     Cardiovascular: Beta Blockers 3 Passed - 05/06/2022  4:05 PM      Passed - Cr in normal range and within 360 days    Creat  Date Value Ref Range Status  08/18/2016 0.64 0.50 - 1.05 mg/dL Final    Comment:      For patients > or = 62 years of age: The upper reference limit for Creatinine is approximately 13% higher for people identified as African-American.      Creatinine, Ser  Date Value Ref Range Status  07/30/2021 0.82 0.57 - 1.00 mg/dL Final   Creatinine, POC  Date Value Ref Range Status  04/26/2017 50 mg/dL Final   Creatinine, Urine  Date Value Ref Range Status  12/10/2014 38.8 mg/dL Final    Comment:    No reference range established.         Passed - AST in normal range and within 360 days    AST  Date Value Ref Range Status  07/30/2021 22 0 - 40 IU/L Final         Passed - ALT in normal range and within 360 days    ALT  Date Value Ref Range Status  07/30/2021 16 0 - 32 IU/L Final         Passed - Last BP in normal range    BP Readings from Last 1 Encounters:  03/31/22 109/71         Passed - Last Heart Rate in normal range    Pulse Readings from Last 1 Encounters:  03/31/22 60         Passed - Valid encounter within last 6 months    Recent Outpatient Visits          1 month ago Type 2 diabetes mellitus with diabetic neuropathy, without long-term current use of insulin (HCC)   [place] [facility] [place], Gavin Pound B, MD   5 months ago Type 2 diabetes mellitus with diabetic neuropathy, without long-term current use of insulin (HCC)   [place] [facility] [place], Gavin Pound B, MD   9 months ago Type 2 diabetes mellitus with diabetic neuropathy, without long-term current use of insulin (HCC)   [place] [facility] [facility] Jonah Blue B, MD   1 year ago Type 2 diabetes mellitus with complication, without long-term current use of insulin [facility])    [facility] [place], Shea Stakes, NP   2 years ago Well female exam with routine gynecological exam    [facility] Cain Saupe, MD

## 2022-05-06 NOTE — Telephone Encounter (Signed)
Medication Refill - Medication: carvedilol (COREG) 3.125 MG tablet [[id number]]     Has the patient contacted their pharmacy? Yes.     Preferred Pharmacy (with phone number or street name):  [id number] [place], [place] - [address] AT [facility] [address]  [address] [place] [address]  Phone: [phone] Fax: [phone]  Hours: Not open 24 hours     Has the patient been seen for an appointment in the last year OR does the patient have an upcoming appointment? Yes.    Agent: Please be advised that RX refills may take up to 3 business days. We ask that you follow-up with your pharmacy.

## 2022-05-12 ENCOUNTER — Ambulatory Visit: Payer: Self-pay

## 2022-05-12 ENCOUNTER — Ambulatory Visit (HOSPITAL_COMMUNITY)
Admission: EM | Admit: 2022-05-12 | Discharge: 2022-05-12 | Disposition: A | Discharge status: Home / Self Care | Payer: Medicaid Other | Attending: Emergency Medicine | Admitting: Emergency Medicine

## 2022-05-12 ENCOUNTER — Other Ambulatory Visit: Payer: Self-pay | Admitting: Pharmacist

## 2022-05-12 ENCOUNTER — Encounter (HOSPITAL_COMMUNITY): Payer: Self-pay

## 2022-05-12 DIAGNOSIS — L299 Pruritus, unspecified: Secondary | ICD-10-CM

## 2022-05-12 DIAGNOSIS — I10 Essential (primary) hypertension: Secondary | ICD-10-CM

## 2022-05-12 MED ORDER — DABIGATRAN ETEXILATE MESYLATE 150 MG PO CAPS: ORAL_CAPSULE | ORAL | 0 refills | Status: DC

## 2022-05-12 MED ORDER — FAMOTIDINE 20 MG PO TABS: 20.0000 mg | ORAL_TABLET | Freq: Every evening | ORAL | 0 refills | Status: DC

## 2022-05-12 NOTE — Telephone Encounter (Signed)
Patient inquiring the last time she was prescribed famotidine (PEPCID) 20 MG tablet by Dr. Laural Benes , patient states she was giving a new rx today at [facility]   Left message to call back.

## 2022-05-12 NOTE — Telephone Encounter (Signed)
Pt requesting increase in medication. 

## 2022-05-12 NOTE — ED Provider Notes (Signed)
[facility]    CSN: [id number] Arrival date & time: 05/12/22  1017     History   Chief Complaint Chief Complaint  Patient presents with   Allergic Reaction    HPI Jane Arellano is a 61 y.o. female.  Presents with 1 week history of itching to the whole body. She noticed rash after mowing the lawn and possible poison ivy exposure. Denies any shortness of breath, trouble breathing or swallowing, tongue or lip swelling.  No new soaps, lotions, detergents, clothing. No new medications. Denies fever, abdominal pain, vomiting, diarrhea. No chest pain.  Has tried oatmeal baths with some relief. Has not tried any medications. Headache last night, none today.  Denies itching today.  Past Medical History:  Diagnosis Date   Atypical chest pain    a. 11/2005 Negative Myoview   Diastolic dysfunction    DM2 (diabetes mellitus, type 2) (HCC)    Dysrhythmia    AFIB HX CV    GERD (gastroesophageal reflux disease)    Heart murmur    History of substance abuse (HCC)    HTN (hypertension)    Iron deficiency anemia    Paroxysmal atrial fibrillation (HCC) 05/01/2013   On Xarelto   Subaortic membrane    a. 01/2010 Echo: EF 55-60%, No rwma, subaortic membrane with elevated LVOT mean gradient of 21 mmHg, Triv AI, mod dil LA, mildly increased PASP. b. 05/02/2013 Echo:  LVEF 51-02%, grade 1 diastolic dysfunction, mild LVH, subaortic stenosis w/ turbulation in LVOT c/w subaortic membrane (mean gradient 42 mmHg/peak gradient 81 mmHg), mild biatrial enlargement    Patient Active Problem List   Diagnosis Date Noted   Secondary hypercoagulable state ([place]) 11/30/2021   Dyslipidemia 10/25/2017   Pap smear for cervical cancer screening 08/18/2016   Diabetic polyneuropathy associated with type 2 diabetes mellitus ([place]) 01/14/2016   Diabetic eye exam ([place]) 01/14/2016   Screening for colorectal cancer 01/14/2016   Neck pain 06/29/2015   Type 2 diabetes mellitus with complication, without  long-term current use of insulin ([place]) 03/16/2015   Essential hypertension 03/16/2015   BV (bacterial vaginosis) 12/11/2014   Screening for colon cancer 12/10/2014   Keloid scar 12/10/2014   Screening for STD (sexually transmitted disease) 12/10/2014   Onychomycosis of toenail 12/10/2014   Cerumen impaction 12/10/2014   Diabetes mellitus type 2 with neurological manifestations ([place]) 08/14/2014   Blurry vision, bilateral 08/14/2014   PAF (paroxysmal atrial fibrillation) ([place]) 05/08/2013   ETOH abuse 05/02/2013   Marijuana abuse 58/52/7782   Diastolic dysfunction 42/35/3614   Depression with anxiety 05/01/2013   Subaortic membrane    HTN (hypertension)    GERD 04/30/2007    Past Surgical History:  Procedure Laterality Date   CARDIOVERSION N/A 11/04/2013   Procedure: CARDIOVERSION;  Surgeon: Peter M Martinique, MD;  Location: [facility] ENDOSCOPY;  Service: Cardiovascular;  Laterality: N/A;   CESAREAN SECTION      OB History   No obstetric history on file.      Home Medications    Prior to Admission medications   Medication Sig Start Date End Date Taking? Authorizing Provider  famotidine (PEPCID) 20 MG tablet Take 1 tablet (20 mg total) by mouth at bedtime. 05/12/22  Yes Nyiesha Beever, Wells Guiles, PA-C  Accu-Chek FastClix Lancets MISC USE AS DIRECTED 07/30/21   Ladell Pier, MD  alum & mag hydroxide-simeth (MAALOX/MYLANTA) 200-200-20 MG/5ML suspension Take 15 mLs by mouth every 6 (six) hours as needed for indigestion or heartburn. 10/30/19   Antony Blackbird, MD  amitriptyline (ELAVIL) 50 MG tablet TAKE 1 TABLET BY MOUTH EVERYDAY AT BEDTIME 09/01/21   Ladell Pier, MD  amLODipine (NORVASC) 10 MG tablet TAKE 1 TABLET BY MOUTH EVERY DAY 01/14/22   Ladell Pier, MD  Blood Glucose Calibration (ACCU-CHEK AVIVA) SOLN USE AS INSTRUCTED 01/11/16   Tresa Garter, MD  Blood Glucose Monitoring Suppl (ACCU-CHEK AVIVA PLUS) w/Device KIT USE AS DIRECTED TWICE DAILY AT 8 AM AND 10 PM 01/04/16    Jegede, Marlena Clipper, MD  Calcium Carb-Cholecalciferol (CALCIUM-VITAMIN D3) 500-400 MG-UNIT TABS Take 1 tablet by mouth daily. 04/11/18   Argentina Donovan, PA-C  carvedilol (COREG) 3.125 MG tablet Take 1 tablet (3.125 mg total) by mouth 2 (two) times daily. 05/06/22   Ladell Pier, MD  cetirizine (ZYRTEC) 10 MG tablet Take 1 tablet (10 mg total) by mouth daily as needed for allergies. As needed for nasal congestion 03/31/22   Ladell Pier, MD  dabigatran (PRADAXA) 150 MG CAPS capsule TAKE 1 CAPSULE BY MOUTH TWICE A DAY 05/12/22   Ladell Pier, MD  fenofibrate (TRICOR) 145 MG tablet TAKE 1 TABLET BY MOUTH EVERY DAY 01/14/22   Ladell Pier, MD  ferrous sulfate 325 (65 FE) MG tablet TAKE 1 TABLET BY MOUTH three times weekly AFTER A MEAL 04/18/22   Ladell Pier, MD  furosemide (LASIX) 20 MG tablet TAKE 1 TABLET BY MOUTH EVERY DAY 04/19/22   Ladell Pier, MD  gabapentin (NEURONTIN) 600 MG tablet Take 1 tablet (600 mg total) by mouth 3 (three) times daily. 07/30/21   Ladell Pier, MD  glucose blood (ACCU-CHEK AVIVA PLUS) test strip Check BS 1-2 times a day 07/30/21   Ladell Pier, MD  hydrOXYzine (ATARAX) 25 MG tablet Take 1 tablet (25 mg total) by mouth every 8 (eight) hours as needed. 02/08/22   Chase Picket, MD  metFORMIN (GLUCOPHAGE) 1000 MG tablet TAKE 1 TABLET BY MOUTH 2 TIMES DAILY WITH A MEAL. 01/14/22   Ladell Pier, MD  Multiple Vitamin (MULTIVITAMIN WITH MINERALS) TABS tablet Take 1 tablet by mouth daily.    [provider]  Omega-3 Fatty Acids (FISH OIL) 1000 MG CPDR Take 3 g by mouth daily. Patient taking differently: Take 2 capsules by mouth daily. 04/11/18   Argentina Donovan, PA-C  omeprazole (PRILOSEC) 40 MG capsule TAKE 1 CAPSULE (40 MG TOTAL) BY MOUTH DAILY. 01/14/22   Ladell Pier, MD  polyethylene glycol powder (GAVILAX) 17 GM/SCOOP powder MIX 1 CAPFUL WITH LIQUID AND DRINK ONCE DAILY 10/30/19   Fulp, Cammie, MD  potassium  chloride (K-DUR,KLOR-CON) 10 MEQ tablet Take 1 tablet (10 mEq total) by mouth daily. 11/06/13 04/24/19  Tresa Garter, MD    Family History Family History  Problem Relation Age of Onset   Diabetes Father        deceased   Cancer Mother 18       colon   Stroke Mother    Hypertension Sister        alive and well   Hypertension Sister        alive and well   Heart attack Brother        deceased @ 72    Social History Social History   Tobacco Use   Smoking status: Never   Smokeless tobacco: Current    Types: Snuff  Vaping Use   Vaping Use: Never used  Substance Use Topics   Alcohol use: Yes  Alcohol/week: 2.0 - 3.0 standard drinks of alcohol    Types: 2 - 3 Glasses of wine per week   Drug use: No    Types: Marijuana    Comment: Smokes marijuana weekly.  Has not used cocaine in 9 years.     Allergies   Xarelto [rivaroxaban], Amoxicillin, Ibuprofen, and Ketorolac tromethamine   Review of Systems Review of Systems  Per HPI  Physical Exam Triage Vital Signs ED Triage Vitals [05/12/22 1036]  Enc Vitals Group     BP 97/66     Pulse Rate (!) 58     Resp 18     Temp 98.1 F (36.7 C)     Temp Source Oral     SpO2 95 %     Weight      Height      Head Circumference      Peak Flow      Pain Score 7     Pain Loc      Pain Edu?      Excl. in [place]?    No data found.  Updated Vital Signs BP 97/66 (BP Location: Left Arm)   Pulse (!) 58   Temp 98.1 F (36.7 C) (Oral)   Resp 18   SpO2 95%     Physical Exam Vitals and nursing note reviewed.  Constitutional:      Appearance: Normal appearance.  HENT:     Mouth/Throat:     Mouth: Mucous membranes are moist.     Pharynx: Oropharynx is clear. No posterior oropharyngeal erythema.  Eyes:     Conjunctiva/sclera: Conjunctivae normal.  Cardiovascular:     Rate and Rhythm: Normal rate and regular rhythm.     Pulses: Normal pulses.     Heart sounds: Normal heart sounds.  Pulmonary:     Effort: Pulmonary  effort is normal. No respiratory distress.     Breath sounds: Normal breath sounds. No wheezing.  Musculoskeletal:        General: Normal range of motion.     Cervical back: Normal range of motion.  Lymphadenopathy:     Cervical: No cervical adenopathy.  Skin:    General: Skin is warm and dry.     Findings: No rash.     Comments: No rash noted anywhere on the body. Not currently itching. No excorations  Neurological:     Mental Status: She is alert and oriented to person, place, and time.     UC Treatments / Results  Labs (all labs ordered are listed, but only abnormal results are displayed) Labs Reviewed - No data to display  EKG  Radiology No results found.  Procedures Procedures  Medications Ordered in UC Medications - No data to display  Initial Impression / Assessment and Plan / UC Course  I have reviewed the triage vital signs and the nursing notes.  Pertinent labs & imaging results that were available during my care of the patient were reviewed by me and considered in my medical decision making (see chart for details).  No rash in clinic today.  She does not have itching currently.  I recommend to try antihistamine regimen for the next few days.  Zyrtec in the morning and Pepcid at nighttime. Patient reports allergy to Benadryl. She can continue oatmeal baths if they help.  Emergency department if she develops trouble breathing or shortness of breath. Return precautions discussed. Patient agrees to plan and is discharged in stable condition.  Final Clinical Impressions(s) / UC Diagnoses  Final diagnoses:  Itching     Discharge Instructions      Take Zyrtec every morning and Pepcid every night for the next week. Follow up with your primary care provider if your symptoms persist.  Take tylenol every 6 hours for headache.  If your symptoms worsen, please go to the emergency department.     ED Prescriptions     Medication Sig Dispense Auth. Provider    famotidine (PEPCID) 20 MG tablet Take 1 tablet (20 mg total) by mouth at bedtime. 30 tablet Reverie Vaquera, Wells Guiles, PA-C      PDMP not reviewed this encounter.   Gladys Gutman, Vernice Jefferson 05/12/22 1106

## 2022-05-12 NOTE — Telephone Encounter (Signed)
  Chief Complaint: Refill Question Symptoms: Pt reports went to UC, given Pepcid 20 mg #30. States she forgot she was taking med before and has not had med for 6 months. Wants to make sure Dr. Laural Benes will continue to prescribe,"I would like it bumped up to 40mg  .That's what I was on before."  Frequency:  Pertinent Negatives: Patient denies  Disposition: [] ED /[] Urgent Care (no appt availability in office) / [] Appointment(In office/virtual)/ []  [facility]/ [] Home Care/ [] Refused Recommended Disposition /[] [facility] [facility]/ [x]  Follow-up with PCP Additional Notes:   Reason for Disposition  [1] Caller has NON-URGENT medicine question about med that PCP prescribed AND [2] triager unable to answer question  Answer Assessment - Initial Assessment Questions 1. NAME of MEDICATION: "What medicine are you calling about?"     Famotidine 20 mg 2. QUESTION: "What is your question?" (e.g., double dose of medicine, side effect)     Would like Dr. to keep refilling 3. PRESCRIBING HCP: "Who prescribed it?" Reason: if prescribed by specialist, call should be referred to that group.     UC  Protocols used: Medication Question Call-A-AH

## 2022-05-12 NOTE — ED Triage Notes (Signed)
Pt c/o itching all over causing pain/soreness/burning to back/chest/head/arms since she mowed the yard on 6/16. States took an oatmeal bath and helped with the bumps but the itching is still there.

## 2022-05-12 NOTE — Discharge Instructions (Addendum)
Take Zyrtec every morning and Pepcid every night for the next week. Follow up with your primary care provider if your symptoms persist.  Take tylenol every 6 hours for headache.  If your symptoms worsen, please go to the emergency department.

## 2022-05-13 ENCOUNTER — Ambulatory Visit
Admission: EM | Admit: 2022-05-13 | Discharge: 2022-05-13 | Disposition: A | Discharge status: Home / Self Care | Payer: Medicaid Other | Attending: Urgent Care | Admitting: Urgent Care

## 2022-05-13 DIAGNOSIS — L299 Pruritus, unspecified: Secondary | ICD-10-CM | POA: Diagnosis not present

## 2022-05-13 DIAGNOSIS — R718 Other abnormality of red blood cells: Secondary | ICD-10-CM | POA: Diagnosis not present

## 2022-05-13 DIAGNOSIS — Z79899 Other long term (current) drug therapy: Secondary | ICD-10-CM | POA: Diagnosis not present

## 2022-05-13 MED ORDER — METHYLPREDNISOLONE SODIUM SUCC 125 MG IJ SOLR
80.0000 mg | Freq: Once | INTRAMUSCULAR | Status: AC
  Administered 2022-05-13: 80 mg via INTRAMUSCULAR

## 2022-05-13 MED ORDER — FAMOTIDINE 40 MG PO TABS: 40.0000 mg | ORAL_TABLET | Freq: Every evening | ORAL | 1 refills | Status: DC

## 2022-05-13 NOTE — Telephone Encounter (Signed)
Pt called and informed of medication being increased and sent to pharmacy.

## 2022-05-13 NOTE — ED Provider Notes (Signed)
[facility]    CSN: [id number] Arrival date & time: 05/13/22  1003      History   Chief Complaint Chief Complaint  Patient presents with   Allergic Reaction    HPI Jane Arellano is a 61 y.o. female.   Pleasant 61 year old female presents today for 1 day follow-up for generalized itching.  She was seen yesterday at an urgent care and was thought to possibly be having an allergic reaction to dust or some type of mite from the detritus outside.  She was prescribed famotidine.  Patient states however she did not start taking it as she has prescribed this in the past and did not feel it been effective.  Upon further review, it appears that patient has had intermittent itching and burning pain generalized since December 2022.  Patient has been seen numerous times in the emergency room and at urgent cares and given various prescription for hydroxyzine, prednisone, famotidine, etc.  Patient admits that her itching has actually been persistent for several months.  It has never associated with a rash.  She also gets a burning pain, most currently noted in her bilateral face.  She denies swelling or redness of the skin.  She is not taking any new prescription medications.  Her last CBC was 1 month ago which showed a low RBC and a minimally high MCV.  Patient does admit to sporadic alcohol use, but denies any daily consumption.  Her itching is not worse after a shower.  She denies weight loss, drenching night sweats, or lymphadenopathy. Pt does have a hx of hypercalcemia in the past, appears to have never been fully worked up by PCP. Pt denies bone pain, but continues to c/o burning in her skin. Pt is prescribed gabapentin 600mg  TID, and admits to taking it without resolution of symptoms.   Allergic Reaction   Past Medical History:  Diagnosis Date   Atypical chest pain    a. 11/2005 Negative Myoview   Diastolic dysfunction    DM2 (diabetes mellitus, type 2) (HCC)    Dysrhythmia     AFIB HX CV    GERD (gastroesophageal reflux disease)    Heart murmur    History of substance abuse (HCC)    HTN (hypertension)    Iron deficiency anemia    Paroxysmal atrial fibrillation (HCC) 05/01/2013   On Xarelto   Subaortic membrane    a. 01/2010 Echo: EF 55-60%, No rwma, subaortic membrane with elevated LVOT mean gradient of 21 mmHg, Triv AI, mod dil LA, mildly increased PASP. b. 05/02/2013 Echo:  LVEF 60-65%, grade 1 diastolic dysfunction, mild LVH, subaortic stenosis w/ turbulation in LVOT c/w subaortic membrane (mean gradient 42 mmHg/peak gradient 81 mmHg), mild biatrial enlargement    Patient Active Problem List   Diagnosis Date Noted   Secondary hypercoagulable state (HCC) 11/30/2021   Dyslipidemia 10/25/2017   Pap smear for cervical cancer screening 08/18/2016   Diabetic polyneuropathy associated with type 2 diabetes mellitus (HCC) 01/14/2016   Diabetic eye exam (HCC) 01/14/2016   Screening for colorectal cancer 01/14/2016   Neck pain 06/29/2015   Type 2 diabetes mellitus with complication, without long-term current use of insulin (HCC) 03/16/2015   Essential hypertension 03/16/2015   BV (bacterial vaginosis) 12/11/2014   Screening for colon cancer 12/10/2014   Keloid scar 12/10/2014   Screening for STD (sexually transmitted disease) 12/10/2014   Onychomycosis of toenail 12/10/2014   Cerumen impaction 12/10/2014   Diabetes mellitus type 2 with neurological manifestations (HCC) 08/14/2014  Blurry vision, bilateral 08/14/2014   PAF (paroxysmal atrial fibrillation) (HCC) 05/08/2013   ETOH abuse 05/02/2013   Marijuana abuse 05/02/2013   Diastolic dysfunction 05/02/2013   Depression with anxiety 05/01/2013   Subaortic membrane    HTN (hypertension)    GERD 04/30/2007    Past Surgical History:  Procedure Laterality Date   CARDIOVERSION N/A 11/04/2013   Procedure: CARDIOVERSION;  Surgeon: Peter M Swaziland, MD;  Location: [facility] ENDOSCOPY;  Service: Cardiovascular;   Laterality: N/A;   CESAREAN SECTION      OB History   No obstetric history on file.      Home Medications    Prior to Admission medications   Medication Sig Start Date End Date Taking? Authorizing Provider  Accu-Chek FastClix Lancets MISC USE AS DIRECTED 07/30/21   Marcine Matar, MD  amitriptyline (ELAVIL) 50 MG tablet TAKE 1 TABLET BY MOUTH EVERYDAY AT BEDTIME 09/01/21   Marcine Matar, MD  amLODipine (NORVASC) 10 MG tablet TAKE 1 TABLET BY MOUTH EVERY DAY 01/14/22   Marcine Matar, MD  Blood Glucose Calibration (ACCU-CHEK AVIVA) SOLN USE AS INSTRUCTED 01/11/16   Quentin Angst, MD  Blood Glucose Monitoring Suppl (ACCU-CHEK AVIVA PLUS) w/Device KIT USE AS DIRECTED TWICE DAILY AT 8 AM AND 10 PM 01/04/16   Jegede, Phylliss Blakes, MD  Calcium Carb-Cholecalciferol (CALCIUM-VITAMIN D3) 500-400 MG-UNIT TABS Take 1 tablet by mouth daily. 04/11/18   Anders Simmonds, PA-C  carvedilol (COREG) 3.125 MG tablet Take 1 tablet (3.125 mg total) by mouth 2 (two) times daily. 05/06/22   Marcine Matar, MD  cetirizine (ZYRTEC) 10 MG tablet Take 1 tablet (10 mg total) by mouth daily as needed for allergies. As needed for nasal congestion 03/31/22   Marcine Matar, MD  dabigatran (PRADAXA) 150 MG CAPS capsule TAKE 1 CAPSULE BY MOUTH TWICE A DAY 05/12/22   Marcine Matar, MD  famotidine (PEPCID) 40 MG tablet Take 1 tablet (40 mg total) by mouth at bedtime. 05/13/22   Hoy Register, MD  fenofibrate (TRICOR) 145 MG tablet TAKE 1 TABLET BY MOUTH EVERY DAY 01/14/22   Marcine Matar, MD  ferrous sulfate 325 (65 FE) MG tablet TAKE 1 TABLET BY MOUTH three times weekly AFTER A MEAL 04/18/22   Marcine Matar, MD  furosemide (LASIX) 20 MG tablet TAKE 1 TABLET BY MOUTH EVERY DAY 04/19/22   Marcine Matar, MD  gabapentin (NEURONTIN) 600 MG tablet Take 1 tablet (600 mg total) by mouth 3 (three) times daily. 07/30/21   Marcine Matar, MD  glucose blood (ACCU-CHEK AVIVA PLUS) test strip  Check BS 1-2 times a day 07/30/21   Marcine Matar, MD  metFORMIN (GLUCOPHAGE) 1000 MG tablet TAKE 1 TABLET BY MOUTH 2 TIMES DAILY WITH A MEAL. 01/14/22   Marcine Matar, MD  Multiple Vitamin (MULTIVITAMIN WITH MINERALS) TABS tablet Take 1 tablet by mouth daily.    [provider]  Omega-3 Fatty Acids (FISH OIL) 1000 MG CPDR Take 3 g by mouth daily. Patient taking differently: Take 2 capsules by mouth daily. 04/11/18   Anders Simmonds, PA-C  omeprazole (PRILOSEC) 40 MG capsule TAKE 1 CAPSULE (40 MG TOTAL) BY MOUTH DAILY. 01/14/22   Marcine Matar, MD  polyethylene glycol powder (GAVILAX) 17 GM/SCOOP powder MIX 1 CAPFUL WITH LIQUID AND DRINK ONCE DAILY 10/30/19   Fulp, Cammie, MD  potassium chloride (K-DUR,KLOR-CON) 10 MEQ tablet Take 1 tablet (10 mEq total) by mouth daily. 11/06/13 04/24/19  Quentin Angst, MD    Family History Family History  Problem Relation Age of Onset   Diabetes Father        deceased   Cancer Mother 55       colon   Stroke Mother    Hypertension Sister        alive and well   Hypertension Sister        alive and well   Heart attack Brother        deceased @ 62    Social History Social History   Tobacco Use   Smoking status: Never   Smokeless tobacco: Current    Types: Snuff  Vaping Use   Vaping Use: Never used  Substance Use Topics   Alcohol use: Yes    Alcohol/week: 2.0 - 3.0 standard drinks of alcohol    Types: 2 - 3 Glasses of wine per week   Drug use: No    Types: Marijuana    Comment: Smokes marijuana weekly.  Has not used cocaine in 9 years.     Allergies   Xarelto [rivaroxaban], Amoxicillin, Ibuprofen, and Ketorolac tromethamine   Review of Systems Review of Systems As per HPI  Physical Exam Triage Vital Signs ED Triage Vitals  Enc Vitals Group     BP 05/13/22 1021 139/67     Pulse Rate 05/13/22 1021 61     Resp 05/13/22 1021 18     Temp 05/13/22 1021 98.2 F (36.8 C)     Temp Source 05/13/22 1021 Oral      SpO2 05/13/22 1021 98 %     Weight --      Height --      Head Circumference --      Peak Flow --      Pain Score 05/13/22 1030 3     Pain Loc --      Pain Edu? --      Excl. in GC? --    No data found.  Updated Vital Signs BP 139/67 (BP Location: Left Arm)   Pulse 61   Temp 98.2 F (36.8 C) (Oral)   Resp 18   SpO2 98%   Visual Acuity Right Eye Distance:   Left Eye Distance:   Bilateral Distance:    Right Eye Near:   Left Eye Near:    Bilateral Near:     Physical Exam Vitals and nursing note reviewed. Exam conducted with a chaperone present.  Constitutional:      General: She is not in acute distress.    Appearance: Normal appearance. She is well-developed and normal weight. She is not ill-appearing, toxic-appearing or diaphoretic.  HENT:     Head: Normocephalic and atraumatic.     Right Ear: Tympanic membrane, ear canal and external ear normal. There is no impacted cerumen.     Left Ear: Tympanic membrane, ear canal and external ear normal. There is no impacted cerumen.     Nose: Nose normal. No congestion or rhinorrhea.     Mouth/Throat:     Mouth: Mucous membranes are moist.     Pharynx: Oropharynx is clear. No oropharyngeal exudate or posterior oropharyngeal erythema.  Eyes:     General: No scleral icterus.       Right eye: No discharge.        Left eye: No discharge.     Extraocular Movements: Extraocular movements intact.     Conjunctiva/sclera: Conjunctivae normal.     Pupils: Pupils are equal, round, and  reactive to light.  Cardiovascular:     Rate and Rhythm: Normal rate and regular rhythm.     Heart sounds: No murmur heard. Pulmonary:     Effort: Pulmonary effort is normal. No respiratory distress.     Breath sounds: Normal breath sounds.  Abdominal:     Palpations: Abdomen is soft.     Tenderness: There is no abdominal tenderness.  Musculoskeletal:        General: No swelling.     Cervical back: Normal range of motion and neck supple. No  rigidity.  Lymphadenopathy:     Cervical: No cervical adenopathy.  Skin:    General: Skin is warm and dry.     Capillary Refill: Capillary refill takes less than 2 seconds.     Coloration: Skin is not jaundiced.     Findings: No erythema, lesion or rash.  Neurological:     General: No focal deficit present.     Mental Status: She is alert and oriented to person, place, and time.  Psychiatric:        Mood and Affect: Mood normal.      UC Treatments / Results  Labs (all labs ordered are listed, but only abnormal results are displayed) Labs Reviewed  CBC WITH DIFFERENTIAL/PLATELET  COMPREHENSIVE METABOLIC PANEL  VITAMIN B12    EKG   Radiology No results found.  Procedures Procedures (including critical care time)  Medications Ordered in UC Medications  methylPREDNISolone sodium succinate (SOLU-MEDROL) 125 mg/2 mL injection 80 mg (80 mg Intramuscular Given 05/13/22 1122)    Initial Impression / Assessment and Plan / UC Course  I have reviewed the triage vital signs and the nursing notes.  Pertinent labs & imaging results that were available during my care of the patient were reviewed by me and considered in my medical decision making (see chart for details).     Hypercalcemia - noted on prior labs.  Has not been checked for 8 months.  Question if patient has an underlying parathyroid disorder causing her symptoms.  Will defer further work-up such as PTH and ionized calcium to her PCP should her calcium remain elevated Itching -this has been treated sporadically for the past 6 months, with no resolution to her symptoms.  She does not appear to be having an allergic reaction, and there is no visible rash.  Labs today to assess for other causes such as lymphoma, renal disease, metabolic abnormality, liver disease.  If all labs normal, patient may benefit from a trial of mirtazapine or doxepin.  Solu-Medrol injection given in office to help patient temporarily with her itching  and burning symptoms. Elevated MCV -normal.  Patient has a history of EtOH abuse, but admits she has cut down significantly is not doing it daily. Long term use of PPI - Will test for B12 deficiency today as this can cause neuropathy symptoms.  Final Clinical Impressions(s) / UC Diagnoses   Final diagnoses:  Hypercalcemia  Itching  Elevated MCV  Long-term current use of proton pump inhibitor therapy     Discharge Instructions      You were given a shot of Solu-Medrol in our office today. You were also evaluated for possible metabolic causes of your recurrent itching. Please call your primary care physician and schedule a follow-up in the next 1 to 2 weeks.   ED Prescriptions   None    PDMP not reviewed this encounter.   Maretta Bees, [place] 05/13/22 2128

## 2022-05-14 LAB — COMPREHENSIVE METABOLIC PANEL
ALT: 14 IU/L (ref 0–32)
AST: 15 IU/L (ref 0–40)
Albumin/Globulin Ratio: 1.7 (ref 1.2–2.2)
Albumin: 4.8 g/dL (ref 3.8–4.8)
Alkaline Phosphatase: 44 IU/L (ref 44–121)
BUN/Creatinine Ratio: 13 (ref 12–28)
BUN: 10 mg/dL (ref 8–27)
Bilirubin Total: 0.3 mg/dL (ref 0.0–1.2)
CO2: 23 mmol/L (ref 20–29)
Calcium: 10.5 mg/dL — ABNORMAL HIGH (ref 8.7–10.3)
Chloride: 102 mmol/L (ref 96–106)
Creatinine, Ser: 0.8 mg/dL (ref 0.57–1.00)
Globulin, Total: 2.9 g/dL (ref 1.5–4.5)
Glucose: 117 mg/dL — ABNORMAL HIGH (ref 70–99)
Potassium: 4.2 mmol/L (ref 3.5–5.2)
Sodium: 141 mmol/L (ref 134–144)
Total Protein: 7.7 g/dL (ref 6.0–8.5)
eGFR: 84 mL/min/{1.73_m2} (ref >59)

## 2022-05-14 LAB — CBC WITH DIFFERENTIAL/PLATELET

## 2022-05-14 LAB — VITAMIN B12: Vitamin B-12: 584 pg/mL (ref 232–1245)

## 2022-05-16 ENCOUNTER — Telehealth (HOSPITAL_COMMUNITY): Payer: Self-pay | Admitting: Emergency Medicine

## 2022-05-17 ENCOUNTER — Ambulatory Visit
Admission: EM | Admit: 2022-05-17 | Discharge: 2022-05-17 | Disposition: A | Discharge status: Home / Self Care | Payer: Medicaid Other | Attending: Urgent Care | Admitting: Urgent Care

## 2022-05-17 LAB — CBC WITH DIFFERENTIAL/PLATELET
Basophils Absolute: 0 10*3/uL (ref 0.0–0.2)
Basos: 1 %
EOS (ABSOLUTE): 0.1 10*3/uL (ref 0.0–0.4)
Eos: 1 %
Hematocrit: 38.5 % (ref 34.0–46.6)
Hemoglobin: 12.8 g/dL (ref 11.1–15.9)
Immature Grans (Abs): 0 10*3/uL (ref 0.0–0.1)
Immature Granulocytes: 0 %
Lymphocytes Absolute: 1.8 10*3/uL (ref 0.7–3.1)
Lymphs: 31 %
MCH: 32.1 pg (ref 26.6–33.0)
MCHC: 33.2 g/dL (ref 31.5–35.7)
MCV: 97 fL (ref 79–97)
Monocytes Absolute: 0.3 10*3/uL (ref 0.1–0.9)
Monocytes: 5 %
Neutrophils Absolute: 3.5 10*3/uL (ref 1.4–7.0)
Neutrophils: 62 %
Platelets: 209 10*3/uL (ref 150–450)
RBC: 3.99 x10E6/uL (ref 3.77–5.28)
RDW: 13.1 % (ref 11.7–15.4)
WBC: 5.7 10*3/uL (ref 3.4–10.8)

## 2022-05-25 ENCOUNTER — Other Ambulatory Visit: Payer: Self-pay

## 2022-05-30 ENCOUNTER — Ambulatory Visit: Payer: Medicaid Other | Admitting: Emergency Medicine

## 2022-06-09 ENCOUNTER — Encounter: Payer: Self-pay | Admitting: Internal Medicine

## 2022-06-19 ENCOUNTER — Other Ambulatory Visit: Payer: Self-pay | Admitting: Family Medicine

## 2022-06-21 ENCOUNTER — Other Ambulatory Visit: Payer: Self-pay | Admitting: Internal Medicine

## 2022-06-21 DIAGNOSIS — E785 Hyperlipidemia, unspecified: Secondary | ICD-10-CM

## 2022-06-21 NOTE — Telephone Encounter (Signed)
Requested Prescriptions  Pending Prescriptions Disp Refills  . famotidine (PEPCID) 40 MG tablet [Pharmacy Med Name: FAMOTIDINE 40 MG TABLET] 30 tablet 2    Sig: TAKE 1 TABLET BY MOUTH EVERYDAY AT BEDTIME     Gastroenterology:  H2 Antagonists Passed - 06/19/2022 11:30 AM      Passed - Valid encounter within last 12 months    Recent Outpatient Visits          2 months ago Type 2 diabetes mellitus with diabetic neuropathy, without long-term current use of insulin (HCC)   [facility] [facility] [place], Gavin Pound B, MD   6 months ago Type 2 diabetes mellitus with diabetic neuropathy, without long-term current use of insulin (HCC)   [place] [facility] [place], Gavin Pound B, MD   10 months ago Type 2 diabetes mellitus with diabetic neuropathy, without long-term current use of insulin (HCC)   [place] [facility] Jonah Blue B, MD   1 year ago Type 2 diabetes mellitus with complication, without long-term current use of insulin [facility])   [place] [facility] [place], Shea Stakes, NP   2 years ago Well female exam with routine gynecological exam   [place] [facility] Cain Saupe, MD

## 2022-07-10 ENCOUNTER — Other Ambulatory Visit: Payer: Self-pay | Admitting: Internal Medicine

## 2022-07-10 DIAGNOSIS — K219 Gastro-esophageal reflux disease without esophagitis: Secondary | ICD-10-CM

## 2022-07-15 ENCOUNTER — Other Ambulatory Visit: Payer: Self-pay | Admitting: Internal Medicine

## 2022-07-15 DIAGNOSIS — I10 Essential (primary) hypertension: Secondary | ICD-10-CM

## 2022-07-15 MED ORDER — AMLODIPINE BESYLATE 10 MG PO TABS: 10.0000 mg | ORAL_TABLET | Freq: Every day | ORAL | 0 refills | Status: DC

## 2022-07-15 NOTE — Telephone Encounter (Signed)
Medication Refill - Medication: amLODipine (NORVASC) 10 MG tablet  Has the patient contacted their pharmacy? Yes.   For patient to reach out to dr  Preferred Pharmacy (with phone number or street name):  [id number] [place], [place] - [address] AT [place] [address] Phone:  [phone]  Fax:  [phone]     Has the patient been seen for an appointment in the last year OR does the patient have an upcoming appointment? Yes.    Agent: Please be advised that RX refills may take up to 3 business days. We ask that you follow-up with your pharmacy.

## 2022-07-15 NOTE — Telephone Encounter (Signed)
Requested Prescriptions  Pending Prescriptions Disp Refills  . amLODipine (NORVASC) 10 MG tablet 90 tablet 0    Sig: Take 1 tablet (10 mg total) by mouth daily.     Cardiovascular: Calcium Channel Blockers 2 Passed - 07/15/2022  2:08 PM      Passed - Last BP in normal range    BP Readings from Last 1 Encounters:  05/13/22 139/67         Passed - Last Heart Rate in normal range    Pulse Readings from Last 1 Encounters:  05/13/22 61         Passed - Valid encounter within last 6 months    Recent Outpatient Visits          3 months ago Type 2 diabetes mellitus with diabetic neuropathy, without long-term current use of insulin (HCC)   [place] [facility] [place], Gavin Pound B, MD   7 months ago Type 2 diabetes mellitus with diabetic neuropathy, without long-term current use of insulin (HCC)   [place] [facility] Jonah Blue B, MD   11 months ago Type 2 diabetes mellitus with diabetic neuropathy, without long-term current use of insulin (HCC)   [place] [facility] Jonah Blue B, MD   1 year ago Type 2 diabetes mellitus with complication, without long-term current use of insulin [facility])   [place] [facility] [place], Shea Stakes, NP   2 years ago Well female exam with routine gynecological exam    [facility] Cain Saupe, MD

## 2022-07-17 ENCOUNTER — Other Ambulatory Visit: Payer: Self-pay | Admitting: Internal Medicine

## 2022-07-17 DIAGNOSIS — K219 Gastro-esophageal reflux disease without esophagitis: Secondary | ICD-10-CM

## 2022-07-18 NOTE — Telephone Encounter (Signed)
Requested medications are due for refill today.  no  Requested medications are on the active medications list.  yes  Last refill. 07/11/2022 #12 1 refill  Future visit scheduled.   no  Notes to clinic.  Pt is requesting a 90 day supply.    Requested Prescriptions  Pending Prescriptions Disp Refills   ferrous sulfate 325 (65 FE) MG tablet [Pharmacy Med Name: FERROUS SULFATE 325 MG TABLET] 36 tablet 1    Sig: TAKE 1 TABLET BY MOUTH THREE TIMES WEEKLY AFTER A MEAL     Endocrinology:  Minerals - Iron Supplementation Failed - 07/17/2022  3:46 PM      Failed - Fe (serum) in normal range and within 360 days    Iron  Date Value Ref Range Status  08/03/2009 149 (H) 42 - 145 mcg/dL Final   Saturation Ratios  Date Value Ref Range Status  08/03/2009 44 20 - 55 % Final         Failed - Ferritin in normal range and within 360 days    No results found for: "FERRITIN"       Passed - HGB in normal range and within 360 days    Hemoglobin  Date Value Ref Range Status  05/17/2022 12.8 11.1 - 15.9 g/dL Final         Passed - HCT in normal range and within 360 days    Hematocrit  Date Value Ref Range Status  05/17/2022 38.5 34.0 - 46.6 % Final         Passed - RBC in normal range and within 360 days    RBC  Date Value Ref Range Status  05/17/2022 3.99 3.77 - 5.28 x10E6/uL Final  07/07/2018 3.72 (L) 3.87 - 5.11 MIL/uL Final         Passed - Valid encounter within last 12 months    Recent Outpatient Visits           3 months ago Type 2 diabetes mellitus with diabetic neuropathy, without long-term current use of insulin (HCC)   [place] [facility] [place], Gavin Pound B, MD   7 months ago Type 2 diabetes mellitus with diabetic neuropathy, without long-term current use of insulin (HCC)   [place] [facility] [place], Gavin Pound B, MD   11 months ago Type 2 diabetes mellitus with diabetic neuropathy, without long-term current use of insulin (HCC)    [place] [facility] Jonah Blue B, MD   1 year ago Type 2 diabetes mellitus with complication, without long-term current use of insulin [facility])   [place] [facility] [place], Shea Stakes, NP   2 years ago Well female exam with routine gynecological exam   [place] [facility] Cain Saupe, MD

## 2022-07-26 ENCOUNTER — Ambulatory Visit: Payer: Self-pay

## 2022-07-26 ENCOUNTER — Other Ambulatory Visit: Payer: Self-pay | Admitting: Internal Medicine

## 2022-07-26 NOTE — Telephone Encounter (Signed)
  Chief Complaint: Medication reaction Symptoms: Dizziness, itching, migraines, feeling sick  - pt discontinued medication Frequency: Since picking up refill of medication with new formulation Pertinent Negatives: Patient denies current s/s Disposition: [] ED /[] Urgent Care (no appt availability in office) / [x] Appointment(In office/virtual)/ []  [place] [facility]/ [] Home Care/ [] Refused Recommended Disposition /[] [place] [facility]/ []  Follow-up with PCP Additional Notes: Pt states that the formulation of amlodipine has been changed. Pt took 3 days of new formulation and had itching, dizziness, migraines and felt sick. She has discontinued this medication - side effects have resolved.  PT is on another BP medication which she will continue to take. She states she feels fine now. Pt does not have  a bp cuff at home. She states she has been doing well with BP. PT will call back if needed.      Reason for Disposition  [1] Caller has NON-URGENT medicine question about med that PCP prescribed AND [2] triager unable to answer question  Answer Assessment - Initial Assessment Questions 1. NAME of MEDICINE: "What medicine(s) are you calling about?"     Amlodipine 2. QUESTION: "What is your question?" (e.g., double dose of medicine, side effect)     Medication formulation was changed  - pt experiencing  side effects. Pt discontinued medication 3. PRESCRIBER: "Who prescribed the medicine?" Reason: if prescribed by specialist, call should be referred to that group.     Dr. 4. SYMPTOMS: "Do you have any symptoms?" If Yes, ask: "What symptoms are you having?"  "How bad are the symptoms (e.g., mild, moderate, severe)     Dizziness, itching, migraine 5. PREGNANCY:  "Is there any chance that you are pregnant?" "When was your last menstrual period?"     na  Protocols used: Medication Question Call-A-AH

## 2022-07-26 NOTE — Telephone Encounter (Signed)
Medication Refill - Medication: omeprazole (PRILOSEC) 40 MG capsule  Has the patient contacted their pharmacy? No. She called in and was triaged for a medication Reaction and wanted me to request Preferred Pharmacy (with phone number or street name): [id number] [place] - [address]  Has the patient been seen for an appointment in the last year OR does the patient have an upcoming appointment? Yes.    Agent: Please be advised that RX refills may take up to 3 business days. We ask that you follow-up with your pharmacy.

## 2022-07-27 MED ORDER — OMEPRAZOLE 40 MG PO CPDR: 40.0000 mg | DELAYED_RELEASE_CAPSULE | Freq: Every day | ORAL | 2 refills | Status: DC

## 2022-07-27 NOTE — Telephone Encounter (Signed)
Requested Prescriptions  Pending Prescriptions Disp Refills  . omeprazole (PRILOSEC) 40 MG capsule 90 capsule 2    Sig: Take 1 capsule (40 mg total) by mouth daily.     Gastroenterology: Proton Pump Inhibitors Passed - 07/26/2022 10:36 AM      Passed - Valid encounter within last 12 months    Recent Outpatient Visits          3 months ago Type 2 diabetes mellitus with diabetic neuropathy, without long-term current use of insulin (HCC)   [place] [facility] [place], Gavin Pound B, MD   7 months ago Type 2 diabetes mellitus with diabetic neuropathy, without long-term current use of insulin (HCC)   [place] [facility] Jonah Blue B, MD   12 months ago Type 2 diabetes mellitus with diabetic neuropathy, without long-term current use of insulin (HCC)   [place] [facility] Jonah Blue B, MD   1 year ago Type 2 diabetes mellitus with complication, without long-term current use of insulin [facility])   [place] [facility] [place], Shea Stakes, NP   2 years ago Well female exam with routine gynecological exam   [place] [facility] [place], [place], MD      Future Appointments            In 4 weeks [place], Marzella Schlein, PA-C [facility]

## 2022-07-27 NOTE — Telephone Encounter (Signed)
Noted.-----DD,RMA 

## 2022-07-28 NOTE — Telephone Encounter (Signed)
Patient returned call- she does have to have transportation scheduled for appointment- and she has been scheduled to see provider on Monday. Agent to send message to office to call her to schedule appointment with pharmacist as directed. Patient states she took half pill-am/half pill-pm yesterday and still had symptoms- headache. Patient does not check her BP at home. Patient is happy with appointment Monday- but advised if her symptoms get worse- she needs to be seen- UC, etc.- she understands. Patient will reach out to pharmacy so they can make note regarding SE/new manufacturer side effects- but request new Rx with previous manufacturer so she can continue her BP medication

## 2022-08-01 ENCOUNTER — Encounter: Payer: Self-pay | Admitting: Internal Medicine

## 2022-08-01 ENCOUNTER — Ambulatory Visit: Payer: Medicaid Other | Attending: Internal Medicine | Admitting: Internal Medicine

## 2022-08-01 ENCOUNTER — Other Ambulatory Visit: Payer: Self-pay

## 2022-08-01 VITALS — BP 126/80 | HR 54 | Temp 98.1°F | Ht 67.0 in | Wt 176.6 lb

## 2022-08-01 DIAGNOSIS — E1140 Type 2 diabetes mellitus with diabetic neuropathy, unspecified: Secondary | ICD-10-CM

## 2022-08-01 DIAGNOSIS — I152 Hypertension secondary to endocrine disorders: Secondary | ICD-10-CM | POA: Diagnosis not present

## 2022-08-01 DIAGNOSIS — E1169 Type 2 diabetes mellitus with other specified complication: Secondary | ICD-10-CM | POA: Diagnosis not present

## 2022-08-01 DIAGNOSIS — Z23 Encounter for immunization: Secondary | ICD-10-CM | POA: Diagnosis not present

## 2022-08-01 DIAGNOSIS — E1159 Type 2 diabetes mellitus with other circulatory complications: Secondary | ICD-10-CM | POA: Diagnosis not present

## 2022-08-01 DIAGNOSIS — T887XXA Unspecified adverse effect of drug or medicament, initial encounter: Secondary | ICD-10-CM | POA: Diagnosis not present

## 2022-08-01 DIAGNOSIS — I480 Paroxysmal atrial fibrillation: Secondary | ICD-10-CM | POA: Diagnosis not present

## 2022-08-01 DIAGNOSIS — E785 Hyperlipidemia, unspecified: Secondary | ICD-10-CM

## 2022-08-01 LAB — POCT GLYCOSYLATED HEMOGLOBIN (HGB A1C): HbA1c, POC (controlled diabetic range): 6.3 % (ref 0.0–7.0)

## 2022-08-01 MED ORDER — LOSARTAN POTASSIUM 25 MG PO TABS
25.0000 mg | ORAL_TABLET | Freq: Every day | ORAL | 0 refills | Status: DC
  Filled 2022-08-01: qty 30, 30d supply, fill #0

## 2022-08-01 NOTE — Progress Notes (Unsigned)
Patient ID: Jane Arellano, female    DOB: February 24, 1961  MRN: [id number]  CC: Diabetes   Subjective: Jane Arellano is a 61 y.o. female who presents for chronic ds management Her concerns today include:  Patient with history of DM type II with neuropathy, HTN, HL, PAF, mild AS/mild to moderate AR  (Echo 02/2021)substance use cocaine (clean 15-20 yrs as of 11/2021), anemia,   DM:   Results for orders placed or performed in visit on 08/01/22  POCT glycosylated hemoglobin (Hb A1C)  Result Value Ref Range   Hemoglobin A1C     HbA1c POC (<> result, manual entry)     HbA1c, POC (prediabetic range)     HbA1c, POC (controlled diabetic range) 6.3 0.0 - 7.0 %   Compliant with Metformin 1 gram BID Doing better with eating habits Needs to start exercise again.  Mow lawn once a wk  HTN/PAF: Patient had called in last week to report that the formulation on Norvasc had changed and she was having side effects from it.  She stopped taking the medication.  She tells me that it was causing headaches, dizziness and hurting all over the upper part of her body.  I clinical pharmacist checked with her pharmacy and found that the manufacturer had changed.  He requested that she be prescribed Norvasc from the same manufacturer as before.  Patient states that even with doing that and trying to take half a tablet, she was still getting the symptoms.  Last took half a tablet of Norvasc last night.   No device to check BP No palpitations, CP or SOB Taking Coreg and Pradaxia.  No bruising or bleeding on Pradaxa.  HL:  taking and tolerating Tricor  IDA:  taking Iron 3x/wk  She tells me that she was off Pepcid for a while but restarted it recently.  She stopped it because it upsets her stomach.    HM:  has c-scope coming up on 08/10/22 Patient Active Problem List   Diagnosis Date Noted   Secondary hypercoagulable state (HCC) 11/30/2021   Dyslipidemia 10/25/2017   Pap smear for cervical cancer screening  08/18/2016   Diabetic polyneuropathy associated with type 2 diabetes mellitus (HCC) 01/14/2016   Diabetic eye exam (HCC) 01/14/2016   Screening for colorectal cancer 01/14/2016   Neck pain 06/29/2015   Type 2 diabetes mellitus with complication, without long-term current use of insulin (HCC) 03/16/2015   Essential hypertension 03/16/2015   BV (bacterial vaginosis) 12/11/2014   Screening for colon cancer 12/10/2014   Keloid scar 12/10/2014   Screening for STD (sexually transmitted disease) 12/10/2014   Onychomycosis of toenail 12/10/2014   Cerumen impaction 12/10/2014   Diabetes mellitus type 2 with neurological manifestations (HCC) 08/14/2014   Blurry vision, bilateral 08/14/2014   PAF (paroxysmal atrial fibrillation) (HCC) 05/08/2013   ETOH abuse 05/02/2013   Marijuana abuse 05/02/2013   Diastolic dysfunction 05/02/2013   Depression with anxiety 05/01/2013   Subaortic membrane    HTN (hypertension)    GERD 04/30/2007     Current Outpatient Medications on File Prior to Visit  Medication Sig Dispense Refill   Accu-Chek FastClix Lancets MISC USE AS DIRECTED 100 each 11   amitriptyline (ELAVIL) 50 MG tablet TAKE 1 TABLET BY MOUTH EVERYDAY AT BEDTIME 90 tablet 0   Blood Glucose Calibration (ACCU-CHEK AVIVA) SOLN USE AS INSTRUCTED 1 each 0   Blood Glucose Monitoring Suppl (ACCU-CHEK AVIVA PLUS) w/Device KIT USE AS DIRECTED TWICE DAILY AT 8 AM AND 10 PM  1 kit 0   Calcium Carb-Cholecalciferol (CALCIUM-VITAMIN D3) 500-400 MG-UNIT TABS Take 1 tablet by mouth daily. 60 tablet 5   carvedilol (COREG) 3.125 MG tablet Take 1 tablet (3.125 mg total) by mouth 2 (two) times daily. 180 tablet 1   cetirizine (ZYRTEC) 10 MG tablet Take 1 tablet (10 mg total) by mouth daily as needed for allergies. As needed for nasal congestion 90 tablet 1   dabigatran (PRADAXA) 150 MG CAPS capsule TAKE 1 CAPSULE BY MOUTH TWICE A DAY 180 capsule 0   fenofibrate (TRICOR) 145 MG tablet TAKE 1 TABLET BY MOUTH EVERY DAY  90 tablet 0   ferrous sulfate 325 (65 FE) MG tablet TAKE 1 TABLET BY MOUTH THREE TIMES WEEKLY AFTER A MEAL 36 tablet 0   furosemide (LASIX) 20 MG tablet TAKE 1 TABLET BY MOUTH EVERY DAY 90 tablet 1   gabapentin (NEURONTIN) 600 MG tablet Take 1 tablet (600 mg total) by mouth 3 (three) times daily. 90 tablet 6   glucose blood (ACCU-CHEK AVIVA PLUS) test strip Check BS 1-2 times a day 100 each 11   metFORMIN (GLUCOPHAGE) 1000 MG tablet TAKE 1 TABLET BY MOUTH 2 TIMES DAILY WITH A MEAL. 180 tablet 1   Multiple Vitamin (MULTIVITAMIN WITH MINERALS) TABS tablet Take 1 tablet by mouth daily.     Omega-3 Fatty Acids (FISH OIL) 1000 MG CPDR Take 3 g by mouth daily. (Patient taking differently: Take 2 capsules by mouth daily.) 90 capsule 6   omeprazole (PRILOSEC) 40 MG capsule Take 1 capsule (40 mg total) by mouth daily. 90 capsule 2   polyethylene glycol powder (GAVILAX) 17 GM/SCOOP powder MIX 1 CAPFUL WITH LIQUID AND DRINK ONCE DAILY 476 g 6   [DISCONTINUED] potassium chloride (K-DUR,KLOR-CON) 10 MEQ tablet Take 1 tablet (10 mEq total) by mouth daily. 30 tablet 0   No current facility-administered medications on file prior to visit.    Allergies  Allergen Reactions   Xarelto [Rivaroxaban] Swelling    Gums bled, headaches   Amoxicillin    Ibuprofen    Ketorolac Tromethamine Other (See Comments)    Unknown reaction    Social History   Socioeconomic History   Marital status: Single    Spouse name: Not on file   Number of children: Not on file   Years of education: Not on file   Highest education level: Not on file  Occupational History   Not on file  Tobacco Use   Smoking status: Never   Smokeless tobacco: Current    Types: Snuff  Vaping Use   Vaping Use: Never used  Substance and Sexual Activity   Alcohol use: Yes    Alcohol/week: 2.0 - 3.0 standard drinks of alcohol    Types: 2 - 3 Glasses of wine per week   Drug use: No    Types: Marijuana    Comment: Smokes marijuana weekly.   Has not used cocaine in 9 years.   Sexual activity: Yes    Birth control/protection: None  Other Topics Concern   Not on file  Social History Narrative   Lives in [place] by herself.  She had been caring for her mother but she died 2 mos ago.  She tries to remain active but does not regularly exercise.   Social Determinants of Health   Financial Resource Strain: Not on file  Food Insecurity: Not on file  Transportation Needs: Not on file  Physical Activity: Not on file  Stress: Not on file  Social Connections:  Not on file  Intimate Partner Violence: Not on file    Family History  Problem Relation Age of Onset   Diabetes Father        deceased   Cancer Mother 53       colon   Stroke Mother    Hypertension Sister        alive and well   Hypertension Sister        alive and well   Heart attack Brother        deceased @ 77    Past Surgical History:  Procedure Laterality Date   CARDIOVERSION N/A 11/04/2013   Procedure: CARDIOVERSION;  Surgeon: Peter M Martinique, MD;  Location: [facility] ENDOSCOPY;  Service: Cardiovascular;  Laterality: N/A;   CESAREAN SECTION      ROS: Review of Systems Negative except as stated above  PHYSICAL EXAM: BP 126/80   Pulse (!) 54   Temp 98.1 F (36.7 C) (Oral)   Ht $R'5\' 7"'qI$  (1.702 m)   Wt 176 lb 9.6 oz (80.1 kg)   BMI 27.66 kg/m   Physical Exam  General appearance - alert, well appearing, older African-American female and in no distress Mental status - normal mood, behavior, speech, dress, motor activity, and thought processes Neck - supple, no significant adenopathy Chest - clear to auscultation, no wheezes, rales or rhonchi, symmetric air entry Heart - normal rate, regular rhythm, normal S1, S2, no murmurs, rubs, clicks or gallops Extremities - peripheral pulses normal, no pedal edema, no clubbing or cyanosis      Latest Ref Rng & Units 05/13/2022   11:22 AM 07/30/2021   11:25 AM 11/09/2020   11:01 AM  CMP  Glucose 70 - 99 mg/dL 117  175  92    BUN 8 - 27 mg/dL $Remove'10  8  8   'uIAjAWa$ Creatinine 0.57 - 1.00 mg/dL 0.80  0.82  0.79   Sodium 134 - 144 mmol/L 141  142  143   Potassium 3.5 - 5.2 mmol/L 4.2  4.1  4.5   Chloride 96 - 106 mmol/L 102  101  104   CO2 20 - 29 mmol/L $RemoveB'23  23  23   'XjWypTUz$ Calcium 8.7 - 10.3 mg/dL 10.5  10.3  10.6   Total Protein 6.0 - 8.5 g/dL 7.7  8.0    Total Bilirubin 0.0 - 1.2 mg/dL 0.3  0.3    Alkaline Phos 44 - 121 IU/L 44  49    AST 0 - 40 IU/L 15  22    ALT 0 - 32 IU/L 14  16     Lipid Panel     Component Value Date/Time   CHOL 134 07/30/2021 1125   TRIG 104 07/30/2021 1125   HDL 52 07/30/2021 1125   CHOLHDL 2.6 07/30/2021 1125   CHOLHDL 2.9 09/12/2016 0939   VLDL NOT CALC 09/12/2016 0939   LDLCALC 63 07/30/2021 1125    CBC    Component Value Date/Time   WBC 5.7 05/17/2022 1201   WBC 3.9 (L) 07/07/2018 1049   RBC 3.99 05/17/2022 1201   RBC 3.72 (L) 07/07/2018 1049   HGB 12.8 05/17/2022 1201   HCT 38.5 05/17/2022 1201   PLT 209 05/17/2022 1201   MCV 97 05/17/2022 1201   MCH 32.1 05/17/2022 1201   MCH 32.3 07/07/2018 1049   MCHC 33.2 05/17/2022 1201   MCHC 31.7 07/07/2018 1049   RDW 13.1 05/17/2022 1201   LYMPHSABS 1.8 05/17/2022 1201   MONOABS 246 08/18/2016 1114  EOSABS 0.1 05/17/2022 1201   BASOSABS 0.0 05/17/2022 1201    ASSESSMENT AND PLAN: 1. Type 2 diabetes mellitus with diabetic neuropathy, without long-term current use of insulin (HCC) At goal.  Continue metformin and healthy eating habits. Encouraged her to move more. - POCT glycosylated hemoglobin (Hb A1C)  2. Hypertension associated with diabetes ([place]) At goal.  Stop amlodipine.  Continue carvedilol.  Add low-dose of Cozaar. - losartan (COZAAR) 25 MG tablet; Take 1 tablet (25 mg total) by mouth daily.  Dispense: 30 tablet; Refill: 0  3. Medication side effect Stop Norvasc since patient is not willing to take the medicine anymore.  4. PAF (paroxysmal atrial fibrillation) (HCC) Stable.  Continue carvedilol and Pradaxa.  She  has upcoming appointment with her cardiologist.  5. Hyperlipidemia associated with type 2 diabetes mellitus ([place]) Continue Tricor. - Lipid panel  6. Need for influenza vaccination      Patient was given the opportunity to ask questions.  Patient verbalized understanding of the plan and was able to repeat key elements of the plan.   This documentation was completed using Radio producer.  Any transcriptional errors are unintentional.  Orders Placed This Encounter  Procedures   Lipid panel   POCT glycosylated hemoglobin (Hb A1C)     Requested Prescriptions   Signed Prescriptions Disp Refills   losartan (COZAAR) 25 MG tablet 30 tablet 0    Sig: Take 1 tablet (25 mg total) by mouth daily.    Return in about 4 months (around 12/01/2022).  Karle Plumber, MD, FACP

## 2022-08-01 NOTE — Progress Notes (Unsigned)
Pt requesting to have Rx sent downstairs.  She state only for today due transportation

## 2022-08-02 ENCOUNTER — Other Ambulatory Visit: Payer: Self-pay

## 2022-08-02 LAB — LIPID PANEL
Chol/HDL Ratio: 2.2 ratio (ref 0.0–4.4)
Cholesterol, Total: 140 mg/dL (ref 100–199)
HDL: 63 mg/dL (ref >39)
LDL Chol Calc (NIH): 59 mg/dL (ref 0–99)
Triglycerides: 95 mg/dL (ref 0–149)
VLDL Cholesterol Cal: 18 mg/dL (ref 5–40)

## 2022-08-10 ENCOUNTER — Emergency Department (HOSPITAL_COMMUNITY): Payer: Medicaid Other

## 2022-08-10 ENCOUNTER — Emergency Department (HOSPITAL_COMMUNITY)
Admission: EM | Admit: 2022-08-10 | Discharge: 2022-08-11 | Disposition: A | Discharge status: Home / Self Care | Payer: Medicaid Other | Attending: Emergency Medicine | Admitting: Emergency Medicine

## 2022-08-10 ENCOUNTER — Other Ambulatory Visit: Payer: Self-pay

## 2022-08-10 ENCOUNTER — Encounter (HOSPITAL_COMMUNITY): Payer: Self-pay

## 2022-08-10 DIAGNOSIS — I4891 Unspecified atrial fibrillation: Secondary | ICD-10-CM | POA: Insufficient documentation

## 2022-08-10 LAB — CBC
HCT: 37.9 % (ref 36.0–46.0)
Hemoglobin: 12.7 g/dL (ref 12.0–15.0)
MCH: 32.8 pg (ref 26.0–34.0)
MCHC: 33.5 g/dL (ref 30.0–36.0)
MCV: 97.9 fL (ref 80.0–100.0)
Platelets: 230 10*3/uL (ref 150–400)
RBC: 3.87 MIL/uL (ref 3.87–5.11)
RDW: 12.4 % (ref 11.5–15.5)
WBC: 4.7 10*3/uL (ref 4.0–10.5)
nRBC: 0 % (ref 0.0–0.2)

## 2022-08-10 LAB — BASIC METABOLIC PANEL
Anion gap: 12 (ref 5–15)
BUN: 9 mg/dL (ref 8–23)
CO2: 22 mmol/L (ref 22–32)
Calcium: 10 mg/dL (ref 8.9–10.3)
Chloride: 103 mmol/L (ref 98–111)
Creatinine, Ser: 0.87 mg/dL (ref 0.44–1.00)
GFR, Estimated: >60 mL/min (ref >60)
Glucose, Bld: 95 mg/dL (ref 70–99)
Potassium: 3.7 mmol/L (ref 3.5–5.1)
Sodium: 137 mmol/L (ref 135–145)

## 2022-08-10 LAB — MAGNESIUM: Magnesium: 2.1 mg/dL (ref 1.7–2.4)

## 2022-08-10 LAB — TROPONIN I (HIGH SENSITIVITY): Troponin I (High Sensitivity): 10 ng/L (ref <18)

## 2022-08-10 MED ORDER — SODIUM CHLORIDE 0.9 % IV BOLUS
500.0000 mL | Freq: Once | INTRAVENOUS | Status: AC
  Administered 2022-08-10: 500 mL via INTRAVENOUS

## 2022-08-10 MED ORDER — DICYCLOMINE HCL 10 MG/5ML PO SOLN
10.0000 mg | Freq: Once | ORAL | Status: AC
  Administered 2022-08-10: 10 mg via ORAL
  Filled 2022-08-10: qty 5

## 2022-08-10 MED ORDER — ALUM & MAG HYDROXIDE-SIMETH 200-200-20 MG/5ML PO SUSP
30.0000 mL | Freq: Once | ORAL | Status: AC
  Administered 2022-08-10: 30 mL via ORAL
  Filled 2022-08-10: qty 30

## 2022-08-10 MED ORDER — DILTIAZEM HCL 25 MG/5ML IV SOLN
20.0000 mg | Freq: Once | INTRAVENOUS | Status: AC
  Administered 2022-08-10: 20 mg via INTRAVENOUS
  Filled 2022-08-10: qty 5

## 2022-08-10 MED ORDER — PANTOPRAZOLE SODIUM 40 MG IV SOLR
40.0000 mg | Freq: Once | INTRAVENOUS | Status: AC
Start: 2022-08-10 — End: 2022-08-10
  Administered 2022-08-10: 40 mg via INTRAVENOUS
  Filled 2022-08-10: qty 10

## 2022-08-10 NOTE — ED Provider Notes (Signed)
[facility] EMERGENCY DEPARTMENT Provider Note   CSN: [id number] Arrival date & time: 08/10/22  1137     History  Chief Complaint  Patient presents with   Atrial Fibrillation    Jane Arellano is a 61 y.o. female with history atrial fibrillation who presents to the ED after referral from endoscopy team for A-fib with elevated heart rate during prep for her colonoscopy.  She takes Pradaxa for anti-platelet therapy but does not take any aspirin and is not anticoagulated.  Dors is burning in her chest but states that she feels it is related to her heartburn as it comes and goes she with regularity over the past couple weeks.  Denies any shortness of breath, palpitations, or near syncopal episodes.  She is companied by her daughter at the bedside.  Patient appears to have a poor understanding of her medical history and the meaning of each medication.  Was recently started on losartan for blood pressure due to poor tolerance of amlodipine with reported fatigue but no true allergic symptoms.  She states that "the pharmacy started making the medicine differently than the used to be on it for a long time".  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  HPI     Home Medications Prior to Admission medications   Medication Sig Start Date End Date Taking? Authorizing Provider  Accu-Chek FastClix Lancets MISC USE AS DIRECTED 07/30/21   Ladell Pier, MD  amitriptyline (ELAVIL) 50 MG tablet TAKE 1 TABLET BY MOUTH EVERYDAY AT BEDTIME 09/01/21   Ladell Pier, MD  Blood Glucose Calibration (ACCU-CHEK AVIVA) SOLN USE AS INSTRUCTED 01/11/16   Tresa Garter, MD  Blood Glucose Monitoring Suppl (ACCU-CHEK AVIVA PLUS) w/Device KIT USE AS DIRECTED TWICE DAILY AT 8 AM AND 10 PM 01/04/16   Jegede, Marlena Clipper, MD  Calcium Carb-Cholecalciferol (CALCIUM-VITAMIN D3) 500-400 MG-UNIT TABS Take 1 tablet by  mouth daily. 04/11/18   Argentina Donovan, PA-C  carvedilol (COREG) 3.125 MG tablet Take 1 tablet (3.125 mg total) by mouth 2 (two) times daily. 05/06/22   Ladell Pier, MD  cetirizine (ZYRTEC) 10 MG tablet Take 1 tablet (10 mg total) by mouth daily as needed for allergies. As needed for nasal congestion 03/31/22   Ladell Pier, MD  dabigatran (PRADAXA) 150 MG CAPS capsule TAKE 1 CAPSULE BY MOUTH TWICE A DAY 05/12/22   Ladell Pier, MD  fenofibrate (TRICOR) 145 MG tablet TAKE 1 TABLET BY MOUTH EVERY DAY 06/22/22   Ladell Pier, MD  ferrous sulfate 325 (65 FE) MG tablet TAKE 1 TABLET BY MOUTH THREE TIMES WEEKLY AFTER A MEAL 07/19/22   Ladell Pier, MD  furosemide (LASIX) 20 MG tablet TAKE 1 TABLET BY MOUTH EVERY DAY 04/19/22   Ladell Pier, MD  gabapentin (NEURONTIN) 600 MG tablet Take 1 tablet (600 mg total) by mouth 3 (three) times daily. 07/30/21   Ladell Pier, MD  glucose blood (ACCU-CHEK AVIVA PLUS) test strip Check BS 1-2 times a day 07/30/21   Ladell Pier, MD  losartan (COZAAR) 25 MG tablet Take 1 tablet (25 mg total) by mouth daily. 08/01/22   Ladell Pier, MD  metFORMIN (GLUCOPHAGE) 1000 MG tablet TAKE 1 TABLET BY MOUTH 2 TIMES DAILY WITH A MEAL. 01/14/22   Ladell Pier, MD  Multiple Vitamin (MULTIVITAMIN WITH MINERALS) TABS tablet Take 1 tablet by mouth daily.  [provider]  Omega-3 Fatty Acids (FISH OIL) 1000 MG CPDR Take 3 g by mouth daily. Patient taking differently: Take 2 capsules by mouth daily. 04/11/18   Argentina Donovan, PA-C  omeprazole (PRILOSEC) 40 MG capsule Take 1 capsule (40 mg total) by mouth daily. 07/27/22   Ladell Pier, MD  polyethylene glycol powder (GAVILAX) 17 GM/SCOOP powder MIX 1 CAPFUL WITH LIQUID AND DRINK ONCE DAILY 10/30/19   Fulp, Cammie, MD  potassium chloride (K-DUR,KLOR-CON) 10 MEQ tablet Take 1 tablet (10 mEq total) by mouth daily. 11/06/13 04/24/19  Tresa Garter, MD      Allergies     Xarelto [rivaroxaban], Amoxicillin, Ibuprofen, and Ketorolac tromethamine    Review of Systems   Review of Systems  Constitutional: Negative.   HENT: Negative.    Respiratory: Negative.    Cardiovascular:  Positive for chest pain.       Burning sensation  Gastrointestinal: Negative.   Genitourinary: Negative.   Musculoskeletal: Negative.   Neurological: Negative.     Physical Exam Updated Vital Signs BP (!) 121/92   Pulse 76   Temp 98.4 F (36.9 C)   Resp 20   Ht $R'5\' 7"'nR$  (1.702 m)   Wt 79.4 kg   SpO2 100%   BMI 27.41 kg/m  Physical Exam Vitals and nursing note reviewed.  Constitutional:      Appearance: She is obese. She is not ill-appearing or toxic-appearing.  HENT:     Head: Normocephalic and atraumatic.     Mouth/Throat:     Mouth: Mucous membranes are moist.     Pharynx: No oropharyngeal exudate or posterior oropharyngeal erythema.  Eyes:     General:        Right eye: No discharge.        Left eye: No discharge.     Conjunctiva/sclera: Conjunctivae normal.  Cardiovascular:     Rate and Rhythm: Tachycardia present. Rhythm irregular.     Pulses: Normal pulses.     Heart sounds: Normal heart sounds. No murmur heard. Pulmonary:     Effort: Pulmonary effort is normal. No respiratory distress.     Breath sounds: Normal breath sounds. No wheezing or rales.  Abdominal:     General: Bowel sounds are normal. There is no distension.     Palpations: Abdomen is soft.     Tenderness: There is no abdominal tenderness. There is no guarding or rebound.  Musculoskeletal:        General: No deformity.     Cervical back: Neck supple.     Right lower leg: No edema.     Left lower leg: No edema.  Skin:    General: Skin is warm and dry.     Capillary Refill: Capillary refill takes less than 2 seconds.  Neurological:     General: No focal deficit present.     Mental Status: She is alert and oriented to person, place, and time. Mental status is at baseline.  Psychiatric:         Mood and Affect: Mood normal.     ED Results / Procedures / Treatments   Labs (all labs ordered are listed, but only abnormal results are displayed) Labs Reviewed  BASIC METABOLIC PANEL  CBC  MAGNESIUM  TROPONIN I (HIGH SENSITIVITY)  TROPONIN I (HIGH SENSITIVITY)    EKG EKG Interpretation  Date/Time:  Wednesday August 10 2022 17:22:43 EDT Ventricular Rate:  133 PR Interval:  182 QRS Duration: 110 QT Interval:  333 QTC  Calculation: 496 R Axis:   30 Text Interpretation: Atrial flutter with predominant 2:1 AV block Nonspecific T abnormalities, lateral leads Borderline prolonged QT interval when compared to prior, faster rate and afib with RVR. No STEMI Confirmed by Antony Blackbird [phone]) on 08/10/2022 10:14:31 PM  Radiology DG Chest 2 View  Result Date: 08/10/2022 CLINICAL DATA:  AFib. EXAM: CHEST - 2 VIEW COMPARISON:  Chest x-ray 07/07/2018. FINDINGS: Similar cardiomediastinal silhouette. Both lungs are clear. No visible pleural effusions or pneumothorax. No acute osseous abnormality. IMPRESSION: No active cardiopulmonary disease. Electronically Signed   By: Margaretha Sheffield M.D.   On: 08/10/2022 14:29    Procedures Procedures    Medications Ordered in ED Medications  pantoprazole (PROTONIX) injection 40 mg (40 mg Intravenous Given 08/10/22 1807)  diltiazem (CARDIZEM) injection 20 mg (20 mg Intravenous Given 08/10/22 1803)  sodium chloride 0.9 % bolus 500 mL (0 mLs Intravenous Stopped 08/10/22 2117)  alum & mag hydroxide-simeth (MAALOX/MYLANTA) 200-200-20 MG/5ML suspension 30 mL (30 mLs Oral Given 08/10/22 2116)  dicyclomine (BENTYL) 10 MG/5ML solution 10 mg (10 mg Oral Given 08/10/22 2117)    ED Course/ Medical Decision Making/ A&P Clinical Course as of 08/10/22 2217  Wed Aug 10, 2022  1817 Heart rate improved to the 70s after dose of diltiazem approximately 8 minutes after administration.  We will continue to monitor. [RS]    Clinical Course User Index [RS]  Gracelynn Bircher, Gypsy Balsam, PA-C                           Medical Decision Making 61 year old female presents with concern for tachycardia during preprocedure set up for colonoscopy today.  Tachycardic with heart rate in the 130s, hypertensive on intake, vitals otherwise normal.  Cardiopulmonary exam as above with tachycardia with irregular rhythm.  Neurovascular intact in all extremities.  Patient well-appearing without chest pain.  Differential gnosis includes is not limited to A-fib with RVR, atrial flutter with 2 1 block, sinus tachycardia, SVT, WPW, metabolic derangement.  Patient ministered single dose of  Amount and/or Complexity of Data Reviewed Labs: ordered.    Details: CBC without leukocytosis or anemia, BMP unremarkable, troponin negative, magnesium normal.  Radiology: ordered.    Details:  Chest x-ray visualized provider negative for acute cardiopulmonary disease ECG/medicine tests:     Details: EKG with normal sinus rhythm bradycardia, prolonged QT but no STEMI initially, repeat EKG with A-flutter with 2:1 block  Risk OTC drugs. Prescription drug management.   Diltiazem administered with resolution of her atrial flutter.  She has maintained heart rate in the 70s to 80s for 4 hours after administration of diltiazem without recurrence of her a flutter or A-fib with RVR.  At this time no further work-up is warranted in the ER.  Recommend close outpatient follow-up with the A-fib clinic, ambulatory referral placed.    No further work-up warranted at this time, clinical concern for emergent underlying etiology that warrant further ED work-up or inpatient management exceedingly low.  Admission considered, however as patient did not require diltiazem infusion to maintain normal heart rate, do not feel this is necessary at this time.  Do feel patient will be equally well served with close outpatient follow-up.  [place] and her daughter  voiced understanding of her medical evaluation and  treatment plan. Each of their questions answered to their expressed satisfaction.  Return precautions were given.  Patient is well-appearing, stable, and was discharged in good condition.Marland Kitchenbs  This chart was dictated  using voice recognition software, Dragon. Despite the best efforts of this provider to proofread and correct errors, errors may still occur which can change documentation meaning.    Final Clinical Impression(s) / ED Diagnoses Final diagnoses:  Atrial fibrillation with RVR [facility])    Rx / DC Orders ED Discharge Orders          Ordered    Amb referral to AFIB Clinic        08/10/22 [place], Sharlene Dory 08/10/22 2217    [place], Gwenyth Allegra, MD 08/10/22 2223

## 2022-08-10 NOTE — Discharge Instructions (Addendum)
You are seen here today for your rapid heart rate this was controlled with medication in the ED.  Please follow-up with atrial fibrillation clinic in outpatient setting continue take your prescribed medications as prescribed.  Return to the ER with any severe symptoms.

## 2022-08-10 NOTE — ED Notes (Signed)
I put in a request for phlebotomy to obtain a troponin for this pt due to being a hard stick.

## 2022-08-10 NOTE — ED Provider Triage Note (Signed)
Emergency Medicine Provider Triage Evaluation Note  Jane Arellano , a 61 y.o. female  was evaluated in triage.  Pt complains of heart burn. Has scheduled colonoscopy today but was cancelled due to fast heart rate that was thought to be afib.  Pt report changes in her BP medications a week ago.  Having heart burn sensation for the past everal weeks.  Denies sob, or fever  Review of Systems  Positive: As above Negative: As above  Physical Exam  BP (!) 130/104 (BP Location: Right Arm)   Pulse (!) 135   Temp 97.9 F (36.6 C) (Oral)   Resp (!) 21   Ht 5\' 7"  (1.702 m)   Wt 79.4 kg   SpO2 100%   BMI 27.41 kg/m  Gen:   Awake, no distress   Resp:  Normal effort  MSK:   Moves extremities without difficulty  Other:    Medical Decision Making  Medically screening exam initiated at 1:35 PM.  Appropriate orders placed.  Jane Arellano was informed that the remainder of the evaluation will be completed by another provider, this initial triage assessment does not replace that evaluation, and the importance of remaining in the ED until their evaluation is complete.  HR of 135, showing sinus tach on EKG.  Request nurse to expedite room to be seen.    Domenic Moras, PA-C 08/10/22 1346

## 2022-08-10 NOTE — ED Triage Notes (Signed)
Pt came in POV d/t being in Afib while they were prepping her for a colonoscopy, she does have a Hx of Afib, they did not do her procedure bc of this. A/Ox4. Endorses that her chest has been burning for the last week as well & she does take meds for acid reflux.

## 2022-08-10 NOTE — ED Notes (Signed)
Meal given

## 2022-08-12 ENCOUNTER — Other Ambulatory Visit: Payer: Self-pay | Admitting: Internal Medicine

## 2022-08-12 ENCOUNTER — Telehealth: Payer: Self-pay

## 2022-08-12 DIAGNOSIS — E1140 Type 2 diabetes mellitus with diabetic neuropathy, unspecified: Secondary | ICD-10-CM

## 2022-08-12 DIAGNOSIS — I10 Essential (primary) hypertension: Secondary | ICD-10-CM

## 2022-08-12 NOTE — Telephone Encounter (Signed)
Furosemide- 04/19/22 #90 1RF- too soon Requested Prescriptions  Pending Prescriptions Disp Refills  . gabapentin (NEURONTIN) 600 MG tablet [Pharmacy Med Name: GABAPENTIN 600 MG TABLET] 90 tablet 6    Sig: TAKE 1 TABLET BY MOUTH THREE TIMES A DAY     Neurology: Anticonvulsants - gabapentin Passed - 08/12/2022 11:25 AM      Passed - Cr in normal range and within 360 days    Creat  Date Value Ref Range Status  08/18/2016 0.64 0.50 - 1.05 mg/dL Final    Comment:      For patients > or = 62 years of age: The upper reference limit for Creatinine is approximately 13% higher for people identified as African-American.      Creatinine, Ser  Date Value Ref Range Status  08/10/2022 0.87 0.44 - 1.00 mg/dL Final   Creatinine, POC  Date Value Ref Range Status  04/26/2017 50 mg/dL Final   Creatinine, Urine  Date Value Ref Range Status  12/10/2014 38.8 mg/dL Final    Comment:    No reference range established.         Passed - Completed PHQ-2 or PHQ-9 in the last 360 days      Passed - Valid encounter within last 12 months    Recent Outpatient Visits          1 week ago Type 2 diabetes mellitus with diabetic neuropathy, without long-term current use of insulin ([place])   [place], [name] B, MD   4 months ago Type 2 diabetes mellitus with diabetic neuropathy, without long-term current use of insulin ([place])   [place], [name] B, MD   8 months ago Type 2 diabetes mellitus with diabetic neuropathy, without long-term current use of insulin ([place])   [place], Deborah B, MD   1 year ago Type 2 diabetes mellitus with diabetic neuropathy, without long-term current use of insulin ([place])   [place], Deborah B, MD   1 year ago Type 2 diabetes mellitus with complication, without long-term current use of insulin [facility])   [place], Vernia Buff, NP      Future Appointments            In 3 months Wynetta Emery, Dalbert Batman, MD [place]           . furosemide (LASIX) 20 MG tablet [Pharmacy Med Name: FUROSEMIDE 20 MG TABLET] 90 tablet 1    Sig: TAKE 1 TABLET BY MOUTH EVERY DAY     Cardiovascular:  Diuretics - Loop Failed - 08/12/2022 11:25 AM      Failed - Last BP in normal range    BP Readings from Last 1 Encounters:  08/10/22 (!) 121/92         Passed - K in normal range and within 180 days    Potassium  Date Value Ref Range Status  08/10/2022 3.7 3.5 - 5.1 mmol/L Final         Passed - Ca in normal range and within 180 days    Calcium  Date Value Ref Range Status  08/10/2022 10.0 8.9 - 10.3 mg/dL Final   Calcium, Ion  Date Value Ref Range Status  03/18/2014 1.22 1.12 - 1.23 mmol/L Final         Passed - Na in normal range and within 180 days  Sodium  Date Value Ref Range Status  08/10/2022 137 135 - 145 mmol/L Final  05/13/2022 141 134 - 144 mmol/L Final         Passed - Cr in normal range and within 180 days    Creat  Date Value Ref Range Status  08/18/2016 0.64 0.50 - 1.05 mg/dL Final    Comment:      For patients > or = 62 years of age: The upper reference limit for Creatinine is approximately 13% higher for people identified as African-American.      Creatinine, Ser  Date Value Ref Range Status  08/10/2022 0.87 0.44 - 1.00 mg/dL Final   Creatinine, POC  Date Value Ref Range Status  04/26/2017 50 mg/dL Final   Creatinine, Urine  Date Value Ref Range Status  12/10/2014 38.8 mg/dL Final    Comment:    No reference range established.         Passed - Cl in normal range and within 180 days    Chloride  Date Value Ref Range Status  08/10/2022 103 98 - 111 mmol/L Final         Passed - Mg Level in normal range and within 180 days    Magnesium  Date Value Ref Range Status  08/10/2022 2.1 1.7 - 2.4 mg/dL Final     Comment:    Performed at [facility], [address], [place], [place] [address]         Passed - Valid encounter within last 6 months    Recent Outpatient Visits          1 week ago Type 2 diabetes mellitus with diabetic neuropathy, without long-term current use of insulin (HCC)   [place] [facility] [place], Gavin Pound B, MD   4 months ago Type 2 diabetes mellitus with diabetic neuropathy, without long-term current use of insulin (HCC)   [place] [facility] [place], Gavin Pound B, MD   8 months ago Type 2 diabetes mellitus with diabetic neuropathy, without long-term current use of insulin (HCC)   [place] [facility] Jonah Blue B, MD   1 year ago Type 2 diabetes mellitus with diabetic neuropathy, without long-term current use of insulin [facility])   [place] [facility] Jonah Blue B, MD   1 year ago Type 2 diabetes mellitus with complication, without long-term current use of insulin [facility])   [place] [facility] Claiborne Rigg, NP      Future Appointments            In 3 months [place], Binnie Rail, MD [facility]

## 2022-08-12 NOTE — Telephone Encounter (Signed)
Requested medication (s) are due for refill today - expired Rx  Requested medication (s) are on the active medication list -yes  Future visit scheduled -yes  Last refill: 07/30/21 #90 6RF  Notes to clinic: expired Rx  Requested Prescriptions  Pending Prescriptions Disp Refills   gabapentin (NEURONTIN) 600 MG tablet [Pharmacy Med Name: GABAPENTIN 600 MG TABLET] 90 tablet 6    Sig: TAKE 1 TABLET BY [place] A DAY     Neurology: Anticonvulsants - gabapentin Passed - 08/12/2022 11:25 AM      Passed - Cr in normal range and within 360 days    Creat  Date Value Ref Range Status  08/18/2016 0.64 0.50 - 1.05 mg/dL Final    Comment:      For patients > or = 62 years of age: The upper reference limit for Creatinine is approximately 13% higher for people identified as African-American.      Creatinine, Ser  Date Value Ref Range Status  08/10/2022 0.87 0.44 - 1.00 mg/dL Final   Creatinine, POC  Date Value Ref Range Status  04/26/2017 50 mg/dL Final   Creatinine, Urine  Date Value Ref Range Status  12/10/2014 38.8 mg/dL Final    Comment:    No reference range established.         Passed - Completed PHQ-2 or PHQ-9 in the last 360 days      Passed - Valid encounter within last 12 months    Recent Outpatient Visits           1 week ago Type 2 diabetes mellitus with diabetic neuropathy, without long-term current use of insulin ([place])   [place], [name] B, MD   4 months ago Type 2 diabetes mellitus with diabetic neuropathy, without long-term current use of insulin ([place])   [place], [name] B, MD   8 months ago Type 2 diabetes mellitus with diabetic neuropathy, without long-term current use of insulin ([place])   [place], Deborah B, MD   1 year ago Type 2 diabetes mellitus with diabetic neuropathy, without long-term current use of insulin ([place])   [place], Deborah B, MD   1 year ago Type 2 diabetes mellitus with complication, without long-term current use of insulin [facility])   [place], Vernia Buff, NP       Future Appointments             In 3 months Ladell Pier, MD [place]            Refused Prescriptions Disp Refills   furosemide (LASIX) 20 MG tablet [Pharmacy Med Name: FUROSEMIDE 20 MG TABLET] 90 tablet 1    Sig: TAKE 1 TABLET BY MOUTH EVERY DAY     Cardiovascular:  Diuretics - Loop Failed - 08/12/2022 11:25 AM      Failed - Last BP in normal range    BP Readings from Last 1 Encounters:  08/10/22 (!) 121/92         Passed - K in normal range and within 180 days    Potassium  Date Value Ref Range Status  08/10/2022 3.7 3.5 - 5.1 mmol/L Final         Passed - Ca in normal range and within 180 days    Calcium  Date Value Ref Range Status  08/10/2022  10.0 8.9 - 10.3 mg/dL Final   Calcium, Ion  Date Value Ref Range Status  03/18/2014 1.22 1.12 - 1.23 mmol/L Final         Passed - Na in normal range and within 180 days    Sodium  Date Value Ref Range Status  08/10/2022 137 135 - 145 mmol/L Final  05/13/2022 141 134 - 144 mmol/L Final         Passed - Cr in normal range and within 180 days    Creat  Date Value Ref Range Status  08/18/2016 0.64 0.50 - 1.05 mg/dL Final    Comment:      For patients > or = 62 years of age: The upper reference limit for Creatinine is approximately 13% higher for people identified as African-American.      Creatinine, Ser  Date Value Ref Range Status  08/10/2022 0.87 0.44 - 1.00 mg/dL Final   Creatinine, POC  Date Value Ref Range Status  04/26/2017 50 mg/dL Final   Creatinine, Urine  Date Value Ref Range Status  12/10/2014 38.8 mg/dL Final    Comment:    No reference range established.         Passed - Cl in normal range and within 180 days     Chloride  Date Value Ref Range Status  08/10/2022 103 98 - 111 mmol/L Final         Passed - Mg Level in normal range and within 180 days    Magnesium  Date Value Ref Range Status  08/10/2022 2.1 1.7 - 2.4 mg/dL Final    Comment:    Performed at [facility], [place] [address], [place], [place] [address]         Passed - Valid encounter within last 6 months    Recent Outpatient Visits           1 week ago Type 2 diabetes mellitus with diabetic neuropathy, without long-term current use of insulin ([place])   [place], [name] B, MD   4 months ago Type 2 diabetes mellitus with diabetic neuropathy, without long-term current use of insulin ([place])   [place], [name] B, MD   8 months ago Type 2 diabetes mellitus with diabetic neuropathy, without long-term current use of insulin ([place])   [place], Deborah B, MD   1 year ago Type 2 diabetes mellitus with diabetic neuropathy, without long-term current use of insulin ([place])   [place], Deborah B, MD   1 year ago Type 2 diabetes mellitus with complication, without long-term current use of insulin [facility])   [place], Vernia Buff, NP       Future Appointments             In 3 months Wynetta Emery, Dalbert Batman, MD [place]               Requested Prescriptions  Pending Prescriptions Disp Refills   gabapentin (NEURONTIN) 600 MG tablet [Pharmacy Med Name: GABAPENTIN 600 MG TABLET] 90 tablet 6    Sig: TAKE 1 TABLET BY [place]     Neurology: Anticonvulsants - gabapentin Passed - 08/12/2022 11:25 AM      Passed - Cr in normal range and within 360 days    Creat  Date Value Ref Range Status  08/18/2016 0.64 0.50 - 1.05 mg/dL Final    Comment:      For patients > or = 62 years of age: The upper  reference limit for Creatinine is approximately 13% higher for people identified as African-American.      Creatinine, Ser  Date Value Ref Range Status  08/10/2022 0.87 0.44 - 1.00 mg/dL Final   Creatinine, POC  Date Value Ref Range Status  04/26/2017 50 mg/dL Final   Creatinine, Urine  Date Value Ref Range Status  12/10/2014 38.8 mg/dL Final    Comment:    No reference range established.         Passed - Completed PHQ-2 or PHQ-9 in the last 360 days      Passed - Valid encounter within last 12 months    Recent Outpatient Visits           1 week ago Type 2 diabetes mellitus with diabetic neuropathy, without long-term current use of insulin ([place])   [place] Karle Plumber B, MD   4 months ago Type 2 diabetes mellitus with diabetic neuropathy, without long-term current use of insulin ([place])   [place] Karle Plumber B, MD   8 months ago Type 2 diabetes mellitus with diabetic neuropathy, without long-term current use of insulin ([place])   [place], Deborah B, MD   1 year ago Type 2 diabetes mellitus with diabetic neuropathy, without long-term current use of insulin ([place])   [place], Deborah B, MD   1 year ago Type 2 diabetes mellitus with complication, without long-term current use of insulin ([place])   [place], Vernia Buff, NP       Future Appointments             In 3 months Ladell Pier, MD [place]            Refused Prescriptions Disp Refills   furosemide (LASIX) 20 MG tablet [Pharmacy Med Name: FUROSEMIDE 20 MG TABLET] 90 tablet 1    Sig: TAKE 1 TABLET BY MOUTH EVERY DAY     Cardiovascular:  Diuretics - Loop Failed - 08/12/2022 11:25 AM      Failed - Last BP in normal range    BP Readings from Last 1 Encounters:  08/10/22 (!) 121/92          Passed - K in normal range and within 180 days    Potassium  Date Value Ref Range Status  08/10/2022 3.7 3.5 - 5.1 mmol/L Final         Passed - Ca in normal range and within 180 days    Calcium  Date Value Ref Range Status  08/10/2022 10.0 8.9 - 10.3 mg/dL Final   Calcium, Ion  Date Value Ref Range Status  03/18/2014 1.22 1.12 - 1.23 mmol/L Final         Passed - Na in normal range and within 180 days    Sodium  Date Value Ref Range Status  08/10/2022 137 135 - 145 mmol/L Final  05/13/2022 141 134 - 144 mmol/L Final         Passed - Cr in normal range and within 180 days    Creat  Date Value Ref Range Status  08/18/2016 0.64 0.50 - 1.05 mg/dL Final    Comment:      For patients >  or = 62 years of age: The upper reference limit for Creatinine is approximately 13% higher for people identified as African-American.      Creatinine, Ser  Date Value Ref Range Status  08/10/2022 0.87 0.44 - 1.00 mg/dL Final   Creatinine, POC  Date Value Ref Range Status  04/26/2017 50 mg/dL Final   Creatinine, Urine  Date Value Ref Range Status  12/10/2014 38.8 mg/dL Final    Comment:    No reference range established.         Passed - Cl in normal range and within 180 days    Chloride  Date Value Ref Range Status  08/10/2022 103 98 - 111 mmol/L Final         Passed - Mg Level in normal range and within 180 days    Magnesium  Date Value Ref Range Status  08/10/2022 2.1 1.7 - 2.4 mg/dL Final    Comment:    Performed at [facility], [place] [address], [place], [place] [address]         Passed - Valid encounter within last 6 months    Recent Outpatient Visits           1 week ago Type 2 diabetes mellitus with diabetic neuropathy, without long-term current use of insulin ([place])   [place], [name] B, MD   4 months ago Type 2 diabetes mellitus with diabetic neuropathy, without long-term current use of insulin ([place])   [place], [name] B, MD   8 months ago Type 2 diabetes mellitus with diabetic neuropathy, without long-term current use of insulin ([place])   [place], Deborah B, MD   1 year ago Type 2 diabetes mellitus with diabetic neuropathy, without long-term current use of insulin [facility])   [place], Deborah B, MD   1 year ago Type 2 diabetes mellitus with complication, without long-term current use of insulin [facility])   [place], Zelda W, NP       Future Appointments             In 3 months Wynetta Emery, Dalbert Batman, MD [place]

## 2022-08-12 NOTE — Telephone Encounter (Signed)
Transition Care Management Unsuccessful Follow-up Telephone Call  Date of discharge and from where:  08/11/2022 from [facility]   Attempts:  1st Attempt  Reason for unsuccessful TCM follow-up call:  Left voice message

## 2022-08-15 NOTE — Telephone Encounter (Signed)
Opened by mistake.

## 2022-08-17 ENCOUNTER — Ambulatory Visit (HOSPITAL_COMMUNITY)
Admission: RE | Admit: 2022-08-17 | Discharge: 2022-08-17 | Disposition: A | Discharge status: Home / Self Care | Payer: Medicaid Other | Source: Ambulatory Visit | Attending: Nurse Practitioner | Admitting: Nurse Practitioner

## 2022-08-17 ENCOUNTER — Encounter (HOSPITAL_COMMUNITY): Payer: Self-pay | Admitting: Nurse Practitioner

## 2022-08-17 VITALS — BP 146/74 | HR 56 | Ht 67.0 in | Wt 177.6 lb

## 2022-08-17 DIAGNOSIS — I480 Paroxysmal atrial fibrillation: Secondary | ICD-10-CM | POA: Diagnosis not present

## 2022-08-17 DIAGNOSIS — I10 Essential (primary) hypertension: Secondary | ICD-10-CM | POA: Insufficient documentation

## 2022-08-17 DIAGNOSIS — Z7901 Long term (current) use of anticoagulants: Secondary | ICD-10-CM | POA: Diagnosis not present

## 2022-08-17 DIAGNOSIS — D6869 Other thrombophilia: Secondary | ICD-10-CM | POA: Diagnosis not present

## 2022-08-17 DIAGNOSIS — I350 Nonrheumatic aortic (valve) stenosis: Secondary | ICD-10-CM | POA: Insufficient documentation

## 2022-08-17 DIAGNOSIS — Z79899 Other long term (current) drug therapy: Secondary | ICD-10-CM | POA: Insufficient documentation

## 2022-08-17 NOTE — Progress Notes (Signed)
Patient ID: Jane Arellano, female   DOB: [birth year], 61 y.o.   MRN: [id number]     Primary Care Physician: Ladell Pier, MD Referring Physician: Dr. Candy Sledge Osier is a 61 y.o. female with a h/o PAF in the afib clinic for evaluation. Dx'ed 05/05/15, asymtpomatic, picked up on physical exam. Apparently very little afib burden since then.  She reports that she feels well other than dealing with peripheral neuropathy from her DM. She has not noticed any reoccurring afib. Continues on DOAC with a   CHA2DS2-VASc Score of at least 4.   Lifestyle habits reviewed and pt reports very little caffeine, no alcohol, walks daily. Denies snoring. Discussed avoiding decongestants going into cold/flu season.  F/u in afib clinic, 04/16/20. She states that she feels great. No issues with afib. Recently, saw PCP for physical with labs.   In afib clinic, 01/28/21. She reports that she feels good. No afib to report. No issues with pradaxa with a CHA2DS2VASc score of 3. Will repeat echo for h/o mild aortic  stenosis with mild to mod on echo in 2020. She did get covid form her daughter in February 2022,  but just had  cold symptoms. Her daughter, unvaccinated did not fare so well, was hospitalized with covid pneumonia, but is on the mend now.   F/u in afib clinic, 08/04/21. She reports that she is feeling well. No afib to report. Just had her physical and got a good report. NSR on EKG.   F/u in afib clinic, 08/17/22. She has no complaints today. She recently  developed afib with RVR at time of colonoscopy so procedure was cancelled and she was sent to the ER. She was given IV cardizem and she converted. No further afib.  Ekg shows SR.   Today, she denies symptoms of palpitations, chest pain, shortness of breath, orthopnea, PND, lower extremity edema, dizziness, presyncope, syncope, or neurologic sequela. The patient is tolerating medications without difficulties and is otherwise without complaint today.   Past  Medical History:  Diagnosis Date   Atypical chest pain    a. 11/2005 Negative Myoview   Diastolic dysfunction    DM2 (diabetes mellitus, type 2) (HCC)    Dysrhythmia    AFIB HX CV    GERD (gastroesophageal reflux disease)    Heart murmur    History of substance abuse (HCC)    HTN (hypertension)    Iron deficiency anemia    Paroxysmal atrial fibrillation (HCC) 05/01/2013   On Xarelto   Subaortic membrane    a. 01/2010 Echo: EF 55-60%, No rwma, subaortic membrane with elevated LVOT mean gradient of 21 mmHg, Triv AI, mod dil LA, mildly increased PASP. b. 05/02/2013 Echo:  LVEF 47-42%, grade 1 diastolic dysfunction, mild LVH, subaortic stenosis w/ turbulation in LVOT c/w subaortic membrane (mean gradient 42 mmHg/peak gradient 81 mmHg), mild biatrial enlargement   Past Surgical History:  Procedure Laterality Date   CARDIOVERSION N/A 11/04/2013   Procedure: CARDIOVERSION;  Surgeon: Peter M Martinique, MD;  Location[facility] ENDOSCOPY;  Service: Cardiovascular;  Laterality: N/A;   CESAREAN SECTION      Current Outpatient Medications  Medication Sig Dispense Refill   Accu-Chek FastClix Lancets MISC USE AS DIRECTED 100 each 11   amitriptyline (ELAVIL) 50 MG tablet TAKE 1 TABLET BY MOUTH EVERYDAY AT BEDTIME 90 tablet 0   Blood Glucose Calibration (ACCU-CHEK AVIVA) SOLN USE AS INSTRUCTED 1 each 0   Blood Glucose Monitoring Suppl (ACCU-CHEK AVIVA PLUS) w/Device KIT USE AS  DIRECTED TWICE DAILY AT 8 AM AND 10 PM 1 kit 0   Calcium Carb-Cholecalciferol (CALCIUM-VITAMIN D3) 500-400 MG-UNIT TABS Take 1 tablet by mouth daily. 60 tablet 5   carvedilol (COREG) 3.125 MG tablet Take 1 tablet (3.125 mg total) by mouth 2 (two) times daily. 180 tablet 1   cetirizine (ZYRTEC) 10 MG tablet Take 1 tablet (10 mg total) by mouth daily as needed for allergies. As needed for nasal congestion 90 tablet 1   dabigatran (PRADAXA) 150 MG CAPS capsule TAKE 1 CAPSULE BY MOUTH TWICE A DAY 180 capsule 0   fenofibrate (TRICOR) 145 MG  tablet TAKE 1 TABLET BY MOUTH EVERY DAY 90 tablet 0   ferrous sulfate 325 (65 FE) MG tablet TAKE 1 TABLET BY MOUTH THREE TIMES WEEKLY AFTER A MEAL 36 tablet 0   furosemide (LASIX) 20 MG tablet TAKE 1 TABLET BY MOUTH EVERY DAY 90 tablet 1   gabapentin (NEURONTIN) 600 MG tablet TAKE 1 TABLET BY MOUTH THREE TIMES A DAY 90 tablet 6   glucose blood (ACCU-CHEK AVIVA PLUS) test strip Check BS 1-2 times a day 100 each 11   losartan (COZAAR) 25 MG tablet Take 1 tablet (25 mg total) by mouth daily. 30 tablet 0   metFORMIN (GLUCOPHAGE) 1000 MG tablet TAKE 1 TABLET BY MOUTH 2 TIMES DAILY WITH A MEAL. 180 tablet 1   Multiple Vitamin (MULTIVITAMIN WITH MINERALS) TABS tablet Take 1 tablet by mouth daily.     Omega-3 Fatty Acids (FISH OIL) 1000 MG CPDR Take 3 g by mouth daily. (Patient taking differently: Take 2 capsules by mouth daily.) 90 capsule 6   omeprazole (PRILOSEC) 40 MG capsule Take 1 capsule (40 mg total) by mouth daily. 90 capsule 2   polyethylene glycol powder (GAVILAX) 17 GM/SCOOP powder MIX 1 CAPFUL WITH LIQUID AND DRINK ONCE DAILY 476 g 6   No current facility-administered medications for this encounter.    Allergies  Allergen Reactions   Xarelto [Rivaroxaban] Swelling    Gums bled, headaches   Amoxicillin    Ibuprofen    Ketorolac Tromethamine Other (See Comments)    Unknown reaction    Social History   Socioeconomic History   Marital status: Single    Spouse name: Not on file   Number of children: Not on file   Years of education: Not on file   Highest education level: Not on file  Occupational History   Not on file  Tobacco Use   Smoking status: Never   Smokeless tobacco: Current    Types: Snuff  Vaping Use   Vaping Use: Never used  Substance and Sexual Activity   Alcohol use: Yes    Alcohol/week: 2.0 - 3.0 standard drinks of alcohol    Types: 2 - 3 Glasses of wine per week   Drug use: No    Types: Marijuana    Comment: Smokes marijuana weekly.  Has not used cocaine  in 9 years.   Sexual activity: Yes    Birth control/protection: None  Other Topics Concern   Not on file  Social History Narrative   Lives in [place] by herself.  She had been caring for her mother but she died 2 mos ago.  She tries to remain active but does not regularly exercise.   Social Determinants of Health   Financial Resource Strain: Not on file  Food Insecurity: Not on file  Transportation Needs: Not on file  Physical Activity: Not on file  Stress: Not on file  Social Connections: Not on file  Intimate Partner Violence: Not on file    Family History  Problem Relation Age of Onset   Diabetes Father        deceased   Cancer Mother 11       colon   Stroke Mother    Hypertension Sister        alive and well   Hypertension Sister        alive and well   Heart attack Brother        deceased @ 4    ROS- All systems are reviewed and negative except as per the HPI above  Physical Exam: Vitals:   08/17/22 1139  Weight: 80.6 kg  Height: _0  (1.702 m)    GEN- The patient is well appearing, alert and oriented x 3 today.   Head- normocephalic, atraumatic Eyes-  Sclera clear, conjunctiva pink Ears- hearing intact Oropharynx- clear Neck- supple, no JVP Lymph- no cervical lymphadenopathy Lungs- Clear to ausculation bilaterally, normal work of breathing Heart- Regular rate and rhythm, 2/6 systolic murmiur murmurs, rubs or gallops, PMI not laterally displaced GI- soft, NT, ND, + BS Extremities- no clubbing, cyanosis, or edema MS- no significant deformity or atrophy Skin- no rash or lesion Psych- euthymic mood, full affect Neuro- strength and sensation are intact  EKG-Vent. rate 56 BPM PR interval 154 ms QRS duration 90 ms QT/QTcB 450/434 ms P-R-T axes * -1 36 Sinus bradycardia with sinus arrhythmia Otherwise normal ECG When compared with ECG of 10-Aug-2022 17:22, PREVIOUS ECG IS PRESENT EKG-   Epic records reviewed  Echo- 02/26/21-  1. Left ventricular  ejection fraction, by estimation, is 55 to 60%. The  left ventricle has normal function. The left ventricle has no regional  wall motion abnormalities. Left ventricular diastolic parameters were  normal.   2. Right ventricular systolic function is low normal. The right  ventricular size is normal. There is normal pulmonary artery systolic  pressure.   3. Left atrial size was moderately dilated.   4. Right atrial size was mildly dilated.   5. The mitral valve is normal in structure. No evidence of mitral valve  regurgitation.   6. The aortic valve is tricuspid. Aortic valve regurgitation is mild to  moderate. Mild aortic valve stenosis. Aortic valve mean gradient measures  11.7 mmHg.   7. Aortic dilatation noted. There is borderline dilatation of the  ascending aorta, measuring 38 mm.   8. The inferior vena cava is normal in size with greater than 50%  respiratory variability, suggesting right atrial pressure of 3 mmHg.   Comparison(s): No significant change from prior study.   Assessment and Plan:  1. PAF No known burden until associated with clean out for colonoscopy Present in afib with RVR and she as sent to the ER with conversion with cardizem IV Continues in SR Continue carvedilol 3.154m bid  Continue pradaxa for chadsvasc of at least 4, no bleeding issues  Recent ER labs stable    2. HTN Stable   3. Mild aortic stenosis with mild to mod regurgitation Echo stable 02/2021  Repeat echo in one year    F/u in afib clinic in 6 months   [place] Zeva Leber, [facility] [phone]

## 2022-08-18 NOTE — Telephone Encounter (Signed)
Transition Care Management Unsuccessful Follow-up Telephone Call  Date of discharge and from where:  08/11/2022 from [facility]  Attempts:  2nd Attempt  Reason for unsuccessful TCM follow-up call:  Left voice message

## 2022-08-23 NOTE — Telephone Encounter (Signed)
Transition Care Management Unsuccessful Follow-up Telephone Call  Date of discharge and from where:  08/11/2022 from [facility]  Attempts:  3rd Attempt  Reason for unsuccessful TCM follow-up call:  Unable to reach patient

## 2022-08-24 ENCOUNTER — Other Ambulatory Visit: Payer: Self-pay | Admitting: Internal Medicine

## 2022-08-24 ENCOUNTER — Ambulatory Visit: Payer: Medicaid Other | Admitting: Physician Assistant

## 2022-08-24 DIAGNOSIS — I152 Hypertension secondary to endocrine disorders: Secondary | ICD-10-CM

## 2022-08-24 MED ORDER — LOSARTAN POTASSIUM 25 MG PO TABS: 25.0000 mg | ORAL_TABLET | Freq: Every day | ORAL | 0 refills | Status: DC

## 2022-08-24 NOTE — Telephone Encounter (Signed)
Requested Prescriptions  Pending Prescriptions Disp Refills  . losartan (COZAAR) 25 MG tablet 90 tablet 0    Sig: Take 1 tablet (25 mg total) by mouth daily.     Cardiovascular:  Angiotensin Receptor Blockers Failed - 08/24/2022 11:29 AM      Failed - Last BP in normal range    BP Readings from Last 1 Encounters:  08/17/22 (!) 146/74         Passed - Cr in normal range and within 180 days    Creat  Date Value Ref Range Status  08/18/2016 0.64 0.50 - 1.05 mg/dL Final    Comment:      For patients > or = 62 years of age: The upper reference limit for Creatinine is approximately 13% higher for people identified as African-American.      Creatinine, Ser  Date Value Ref Range Status  08/10/2022 0.87 0.44 - 1.00 mg/dL Final   Creatinine, POC  Date Value Ref Range Status  04/26/2017 50 mg/dL Final   Creatinine, Urine  Date Value Ref Range Status  12/10/2014 38.8 mg/dL Final    Comment:    No reference range established.         Passed - K in normal range and within 180 days    Potassium  Date Value Ref Range Status  08/10/2022 3.7 3.5 - 5.1 mmol/L Final         Passed - Patient is not pregnant      Passed - Valid encounter within last 6 months    Recent Outpatient Visits          3 weeks ago Type 2 diabetes mellitus with diabetic neuropathy, without long-term current use of insulin ([place])   [place], [name] B, MD   4 months ago Type 2 diabetes mellitus with diabetic neuropathy, without long-term current use of insulin ([place])   [place], [name] B, MD   8 months ago Type 2 diabetes mellitus with diabetic neuropathy, without long-term current use of insulin ([place])   [place], Deborah B, MD   1 year ago Type 2 diabetes mellitus with diabetic neuropathy, without long-term current use of insulin ([place])   [place],  Deborah B, MD   1 year ago Type 2 diabetes mellitus with complication, without long-term current use of insulin [facility])   [place], Vernia Buff, NP      Future Appointments            In 3 months Wynetta Emery, Dalbert Batman, MD [place]

## 2022-08-24 NOTE — Telephone Encounter (Signed)
Medication Refill - Medication:  losartan (COZAAR) 25 MG tablet   Has the patient contacted their pharmacy? No / using a different pharmacy  (Agent: If no, request that the patient contact the pharmacy for the refill. If patient does not wish to contact the pharmacy document the reason why and proceed with request.) (Agent: If yes, when and what did the pharmacy advise?)  Preferred Pharmacy (with phone number or street name): [id number] - [place], [place] Has the patient been seen for an appointment in the last year OR does the patient have an upcoming appointment? Yes.    Agent: Please be advised that RX refills may take up to 3 business days. We ask that you follow-up with your pharmacy.

## 2022-09-09 ENCOUNTER — Other Ambulatory Visit: Payer: Self-pay | Admitting: Internal Medicine

## 2022-09-09 DIAGNOSIS — E118 Type 2 diabetes mellitus with unspecified complications: Secondary | ICD-10-CM

## 2022-10-17 ENCOUNTER — Other Ambulatory Visit: Payer: Self-pay | Admitting: Internal Medicine

## 2022-10-17 DIAGNOSIS — I10 Essential (primary) hypertension: Secondary | ICD-10-CM

## 2022-10-17 DIAGNOSIS — E785 Hyperlipidemia, unspecified: Secondary | ICD-10-CM

## 2022-11-22 ENCOUNTER — Other Ambulatory Visit: Payer: Self-pay | Admitting: Internal Medicine

## 2022-11-22 DIAGNOSIS — I10 Essential (primary) hypertension: Secondary | ICD-10-CM

## 2022-11-22 DIAGNOSIS — E1159 Type 2 diabetes mellitus with other circulatory complications: Secondary | ICD-10-CM

## 2022-12-05 ENCOUNTER — Ambulatory Visit: Payer: Medicaid Other | Admitting: Internal Medicine

## 2022-12-09 ENCOUNTER — Other Ambulatory Visit: Payer: Self-pay | Admitting: Internal Medicine

## 2022-12-09 DIAGNOSIS — I10 Essential (primary) hypertension: Secondary | ICD-10-CM

## 2022-12-09 NOTE — Telephone Encounter (Signed)
Copied from [place] [id number]. Topic: General - Other >> Dec 09, 2022 11:49 AM Everette C wrote: Reason for CRM: Medication Refill - Medication: dabigatran (PRADAXA) 150 MG CAPS capsule [[id number]]  Has the patient contacted their pharmacy? Yes.   (Agent: If no, request that the patient contact the pharmacy for the refill. If patient does not wish to contact the pharmacy document the reason why and proceed with request.) (Agent: If yes, when and what did the pharmacy advise?)  Preferred Pharmacy (with phone number or street name): [id number] - [place], [place] [address] Phone: [phone] Fax: [phone] Hours: Not open 24 hours   Has the patient been seen for an appointment in the last year OR does the patient have an upcoming appointment? Yes.    Agent: Please be advised that RX refills may take up to 3 business days. We ask that you follow-up with your pharmacy.

## 2022-12-12 MED ORDER — DABIGATRAN ETEXILATE MESYLATE 150 MG PO CAPS: ORAL_CAPSULE | ORAL | 0 refills | Status: DC

## 2022-12-22 ENCOUNTER — Encounter (HOSPITAL_BASED_OUTPATIENT_CLINIC_OR_DEPARTMENT_OTHER): Payer: Self-pay

## 2022-12-22 ENCOUNTER — Emergency Department (HOSPITAL_BASED_OUTPATIENT_CLINIC_OR_DEPARTMENT_OTHER)
Admission: EM | Admit: 2022-12-22 | Discharge: 2022-12-22 | Disposition: A | Discharge status: Home / Self Care | Payer: Medicaid Other | Attending: Emergency Medicine | Admitting: Emergency Medicine

## 2022-12-22 ENCOUNTER — Ambulatory Visit
Admission: EM | Admit: 2022-12-22 | Discharge: 2022-12-22 | Disposition: A | Discharge status: Home / Self Care | Payer: Medicaid Other

## 2022-12-22 ENCOUNTER — Emergency Department (HOSPITAL_BASED_OUTPATIENT_CLINIC_OR_DEPARTMENT_OTHER): Payer: Medicaid Other | Admitting: Radiology

## 2022-12-22 ENCOUNTER — Other Ambulatory Visit: Payer: Self-pay

## 2022-12-22 DIAGNOSIS — K2100 Gastro-esophageal reflux disease with esophagitis, without bleeding: Secondary | ICD-10-CM

## 2022-12-22 DIAGNOSIS — H9312 Tinnitus, left ear: Secondary | ICD-10-CM

## 2022-12-22 DIAGNOSIS — Z79899 Other long term (current) drug therapy: Secondary | ICD-10-CM | POA: Insufficient documentation

## 2022-12-22 DIAGNOSIS — R079 Chest pain, unspecified: Secondary | ICD-10-CM

## 2022-12-22 DIAGNOSIS — R001 Bradycardia, unspecified: Secondary | ICD-10-CM | POA: Insufficient documentation

## 2022-12-22 DIAGNOSIS — E119 Type 2 diabetes mellitus without complications: Secondary | ICD-10-CM | POA: Diagnosis not present

## 2022-12-22 DIAGNOSIS — Z7901 Long term (current) use of anticoagulants: Secondary | ICD-10-CM | POA: Insufficient documentation

## 2022-12-22 DIAGNOSIS — I10 Essential (primary) hypertension: Secondary | ICD-10-CM | POA: Diagnosis not present

## 2022-12-22 DIAGNOSIS — R0789 Other chest pain: Secondary | ICD-10-CM | POA: Diagnosis not present

## 2022-12-22 DIAGNOSIS — Z7984 Long term (current) use of oral hypoglycemic drugs: Secondary | ICD-10-CM | POA: Insufficient documentation

## 2022-12-22 LAB — BASIC METABOLIC PANEL
Anion gap: 10 (ref 5–15)
BUN: 12 mg/dL (ref 8–23)
CO2: 26 mmol/L (ref 22–32)
Calcium: 10.6 mg/dL — ABNORMAL HIGH (ref 8.9–10.3)
Chloride: 100 mmol/L (ref 98–111)
Creatinine, Ser: 0.91 mg/dL (ref 0.44–1.00)
GFR, Estimated: >60 mL/min (ref >60)
Glucose, Bld: 106 mg/dL — ABNORMAL HIGH (ref 70–99)
Potassium: 3.9 mmol/L (ref 3.5–5.1)
Sodium: 136 mmol/L (ref 135–145)

## 2022-12-22 LAB — CBC
HCT: 34.9 % — ABNORMAL LOW (ref 36.0–46.0)
Hemoglobin: 11.8 g/dL — ABNORMAL LOW (ref 12.0–15.0)
MCH: 31.8 pg (ref 26.0–34.0)
MCHC: 33.8 g/dL (ref 30.0–36.0)
MCV: 94.1 fL (ref 80.0–100.0)
Platelets: 197 10*3/uL (ref 150–400)
RBC: 3.71 MIL/uL — ABNORMAL LOW (ref 3.87–5.11)
RDW: 12.5 % (ref 11.5–15.5)
WBC: 4.3 10*3/uL (ref 4.0–10.5)
nRBC: 0 % (ref 0.0–0.2)

## 2022-12-22 LAB — TROPONIN I (HIGH SENSITIVITY): Troponin I (High Sensitivity): 5 ng/L (ref <18)

## 2022-12-22 MED ORDER — SUCRALFATE 1 G PO TABS: 1.0000 g | ORAL_TABLET | Freq: Three times a day (TID) | ORAL | 0 refills | Status: DC

## 2022-12-22 MED ORDER — ALUM & MAG HYDROXIDE-SIMETH 200-200-20 MG/5ML PO SUSP
30.0000 mL | Freq: Once | ORAL | Status: AC
  Administered 2022-12-22: 30 mL via ORAL

## 2022-12-22 MED ORDER — LIDOCAINE VISCOUS HCL 2 % MT SOLN
15.0000 mL | Freq: Once | OROMUCOSAL | Status: AC
  Administered 2022-12-22: 15 mL via OROMUCOSAL

## 2022-12-22 NOTE — ED Provider Notes (Signed)
[facility] Provider Note   CSN: [id number] Arrival date & time: 12/22/22  1108     History  Chief Complaint  Patient presents with   Chest Pain    Jane Arellano is a 62 y.o. female.   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  Echo in April 2020 showed EF of 55 to 60% who is currently on 2 different antacids both Prilosec and Pepcid who presented to urgent care due to a 2-week history of worsening reflux symptoms.  She reports after eating and when she tries to go to bed she has a burning sensation that goes up into her chest.  She has not had any vomiting, shortness of breath or swelling in her legs.  She denies recent change in medications.  She was seen at urgent care but given her symptoms they gave her a GI talk cocktail but recommended she come here for further evaluation.  She reports after the GI cocktail she feels much better.  The history is provided by the patient and medical records.  Chest Pain      Home Medications Prior to Admission medications   Medication Sig Start Date End Date Taking? Authorizing Provider  sucralfate (CARAFATE) 1 g tablet Take 1 tablet (1 g total) by mouth 4 (four) times daily -  with meals and at bedtime. You can swallow the pill or dissolve in water 12/22/22  Yes Blanchie Dessert, MD  Accu-Chek FastClix Lancets MISC USE AS DIRECTED 07/30/21   Ladell Pier, MD  amitriptyline (ELAVIL) 50 MG tablet TAKE 1 TABLET BY MOUTH EVERYDAY AT BEDTIME 09/01/21   Ladell Pier, MD  Blood Glucose Calibration (ACCU-CHEK AVIVA) SOLN USE AS INSTRUCTED 01/11/16   Tresa Garter, MD  Blood Glucose Monitoring Suppl (ACCU-CHEK AVIVA PLUS) w/Device KIT USE AS DIRECTED TWICE DAILY AT 8 AM AND 10 PM 01/04/16   Jegede, Marlena Clipper, MD  Calcium  Carb-Cholecalciferol (CALCIUM-VITAMIN D3) 500-400 MG-UNIT TABS Take 1 tablet by mouth daily. 04/11/18   Argentina Donovan, PA-C  carvedilol (COREG) 3.125 MG tablet TAKE 1 TABLET BY MOUTH 2 TIMES DAILY. 11/22/22   Ladell Pier, MD  cetirizine (ZYRTEC) 10 MG tablet Take 1 tablet (10 mg total) by mouth daily as needed for allergies. As needed for nasal congestion 03/31/22   Ladell Pier, MD  dabigatran (PRADAXA) 150 MG CAPS capsule TAKE 1 CAPSULE BY MOUTH TWICE A DAY 12/12/22   Ladell Pier, MD  famotidine (PEPCID) 40 MG tablet Take 40 mg by mouth daily.    [provider]  fenofibrate (TRICOR) 145 MG tablet TAKE 1 TABLET BY MOUTH EVERY DAY 10/17/22   Ladell Pier, MD  ferrous sulfate 325 (65 FE) MG tablet TAKE 1 TABLET BY MOUTH THREE TIMES WEEKLY AFTER A MEAL 07/19/22   Ladell Pier, MD  folic acid (FOLVITE) 253 MCG tablet Take 800 mcg by mouth daily.    [provider]  furosemide (LASIX) 20 MG tablet TAKE 1 TABLET BY MOUTH EVERY DAY 10/17/22   Ladell Pier, MD  gabapentin (NEURONTIN) 600 MG tablet TAKE 1 TABLET BY MOUTH THREE TIMES A DAY 08/13/22   Ladell Pier, MD  glucose blood (ACCU-CHEK AVIVA PLUS) test strip Check BS 1-2 times a day 07/30/21   Ladell Pier, MD  hydrOXYzine (ATARAX) 25 MG  tablet Take 25 mg by mouth 3 (three) times daily as needed.    [provider]  losartan (COZAAR) 25 MG tablet TAKE 1 TABLET (25 MG TOTAL) BY MOUTH DAILY. 11/22/22   Ladell Pier, MD  metFORMIN (GLUCOPHAGE) 1000 MG tablet TAKE 1 TABLET BY MOUTH 2 TIMES DAILY WITH A MEAL. 09/12/22   Ladell Pier, MD  Multiple Vitamin (MULTIVITAMIN WITH MINERALS) TABS tablet Take 1 tablet by mouth daily.    [provider]  Omega-3 Fatty Acids (FISH OIL) 1000 MG CPDR Take 3 g by mouth daily. Patient taking differently: Take 2 capsules by mouth daily. 04/11/18   Argentina Donovan, PA-C  omeprazole (PRILOSEC) 40 MG capsule Take 1 capsule (40 mg  total) by mouth daily. 07/27/22   Ladell Pier, MD  polyethylene glycol powder (GAVILAX) 17 GM/SCOOP powder MIX 1 CAPFUL WITH LIQUID AND DRINK ONCE DAILY 10/30/19   Fulp, Cammie, MD  potassium chloride (K-DUR,KLOR-CON) 10 MEQ tablet Take 1 tablet (10 mEq total) by mouth daily. 11/06/13 04/24/19  Tresa Garter, MD      Allergies    Xarelto [rivaroxaban], Amoxicillin, Ibuprofen, and Ketorolac tromethamine    Review of Systems   Review of Systems  Cardiovascular:  Positive for chest pain.    Physical Exam Updated Vital Signs BP (!) 163/85 (BP Location: Right Arm)   Pulse (!) 55   Temp 97.8 F (36.6 C)   Resp 16   Ht 5\' 7"  (1.702 m)   Wt 80.6 kg   SpO2 100%   BMI 27.83 kg/m  Physical Exam Vitals and nursing note reviewed.  Constitutional:      General: She is not in acute distress.    Appearance: She is well-developed.  HENT:     Head: Normocephalic and atraumatic.  Eyes:     Pupils: Pupils are equal, round, and reactive to light.  Cardiovascular:     Rate and Rhythm: Regular rhythm. Bradycardia present.     Heart sounds: Normal heart sounds. No murmur heard.    No friction rub.  Pulmonary:     Effort: Pulmonary effort is normal.     Breath sounds: Normal breath sounds. No wheezing or rales.     Comments: Well-healed midline sternotomy scar Chest:     Chest wall: No tenderness.  Abdominal:     General: Bowel sounds are normal. There is no distension.     Palpations: Abdomen is soft.     Tenderness: There is abdominal tenderness. There is no guarding or rebound.     Comments: Minimal epigastric tenderness  Musculoskeletal:        General: No tenderness. Normal range of motion.     Cervical back: Normal range of motion and neck supple.     Right lower leg: No edema.     Left lower leg: No edema.     Comments: No edema  Skin:    General: Skin is warm and dry.     Findings: No rash.  Neurological:     Mental Status: She is alert and oriented to person,  place, and time. Mental status is at baseline.     Cranial Nerves: No cranial nerve deficit.  Psychiatric:        Behavior: Behavior normal.     ED Results / Procedures / Treatments   Labs (all labs ordered are listed, but only abnormal results are displayed) Labs Reviewed  BASIC METABOLIC PANEL - Abnormal; Notable for the following components:  Result Value   Glucose, Bld 106 (*)    Calcium 10.6 (*)    All other components within normal limits  CBC - Abnormal; Notable for the following components:   RBC 3.71 (*)    Hemoglobin 11.8 (*)    HCT 34.9 (*)    All other components within normal limits  TROPONIN I (HIGH SENSITIVITY)    EKG EKG Interpretation  Date/Time:  Thursday December 22 2022 11:15:10 EST Ventricular Rate:  59 PR Interval:  148 QRS Duration: 94 QT Interval:  442 QTC Calculation: 437 R Axis:   22 Text Interpretation: Sinus bradycardia When compared with ECG of 22-Dec-2022 09:37, Ectopic atrial rhythm has replaced Sinus rhythm T wave inversion less evident in Inferior leads No significant change since last tracing Confirmed by Gwyneth Sprout ([id number]) on 12/22/2022 11:29:35 AM  Radiology DG Chest 2 View  Result Date: 12/22/2022 CLINICAL DATA:  Chest pain. EXAM: CHEST - 2 VIEW COMPARISON:  08/10/2022. FINDINGS: Clear lungs. 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 No pleural effusion or pneumothorax. Visualized bones and upper abdomen are unremarkable. IMPRESSION: No evidence of acute cardiopulmonary disease. Electronically Signed   By: Orvan Falconer M.D.   On: 12/22/2022 11:56    Procedures Procedures    Medications Ordered in ED Medications - No data to display  ED Course/ Medical Decision Making/ A&P                             Medical Decision Making Amount and/or Complexity of Data Reviewed Labs: ordered. Radiology: ordered.   Pt with multiple medical problems and comorbidities and  presenting today with a complaint that caries a high risk for morbidity and mortality.  Here today with complaint of epigastric pain and chest pain.  Seems to be more GI in nature as it is worse after eating and lying down to go to bed.  She is however on a PPI and an H2 blocker reports in the last 2 weeks it really does not seem to be helping.  She does not take any NSAIDs.  She is asking if she can cut her metformin in half.  She currently takes 1000 mg morning and night and blood sugars have been less than 100 most of the time when she checks.  Did discuss with her she could try 500 twice daily as she thinks it is irritating her stomach and follow her sugars closely.  Lower suspicion for acute cardiac issues today.  No evidence of fluid overload on exam.  Breath sounds are clear.  I independently interpreted patient's EKG and labs.  EKG without acute changes, CBC, BMP without acute findings and troponin is negative.  I have independently visualized and interpreted pt's images today.  Chest x-ray within normal limits today.  Findings discussed with the patient.  Discussed with her dietary restrictions which she already is doing a pretty good job following a good diet.  Discussed no alcohol or NSAIDs which she does not use at this time.  Also will try adding Carafate to see if that helps with her discomfort.  She ultimately needs to follow-up with her doctor and may need to see GI in the future for an endoscopy.          Final Clinical Impression(s) / ED Diagnoses Final diagnoses:  Gastroesophageal reflux disease with esophagitis without hemorrhage    Rx / DC Orders ED Discharge Orders  Ordered    sucralfate (CARAFATE) 1 g tablet  3 times daily with meals & bedtime        12/22/22 1210              Blanchie Dessert, MD 12/22/22 1246

## 2022-12-22 NOTE — ED Triage Notes (Signed)
Pt states that she is going to the nearest ED. Lm

## 2022-12-22 NOTE — ED Provider Notes (Signed)
[facility]    CSN: [id number] Arrival date & time: 12/22/22  5573      History   Chief Complaint Chief Complaint  Patient presents with   Gastroesophageal Reflux   Tinnitus    HPI Jane Arellano is a 62 y.o. female presents for evaluation of GERD/chest pain.  Patient reports a longstanding history of GERD and is on omeprazole.  Past couple days she has noticed it has been worse than normal is not responding to the omeprazole.  States she has not been able to sleep much the past couple days due to the pain.  She endorses a midsternal chest burning that does not radiate.  She rates it as an 8 out of 10.  Denies any shortness of breath, nausea/vomiting, dizziness, palpitations, diaphoresis, syncope.  Denies any change in her diet.  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  Echo in April 2020 showed EF of 55 to 60%.  She denies smoking history.  Reports that family feels history of her brother having a heart attack at age 75.  She has not taken any OTC medications for any of her symptoms.  Also reports some left ear ringing for the past couple days.  Denies ear pain, drainage.  No URI symptoms or fevers.  No other concerns at this time.   Gastroesophageal Reflux Associated symptoms include chest pain.    Past Medical History:  Diagnosis Date   Atypical chest pain    a. 11/2005 Negative Myoview   Diastolic dysfunction    DM2 (diabetes mellitus, type 2) (HCC)    Dysrhythmia    AFIB HX CV    GERD (gastroesophageal reflux disease)    Heart murmur    History of substance abuse (HCC)    HTN (hypertension)    Iron deficiency anemia    Paroxysmal atrial fibrillation (HCC) 05/01/2013   On Xarelto   Subaortic membrane    a. 01/2010 Echo: EF 55-60%, No rwma, subaortic membrane with elevated LVOT mean gradient of 21 mmHg,  Triv AI, mod dil LA, mildly increased PASP. b. 05/02/2013 Echo:  LVEF 60-65%, grade 1 diastolic dysfunction, mild LVH, subaortic stenosis w/ turbulation in LVOT c/w subaortic membrane (mean gradient 42 mmHg/peak gradient 81 mmHg), mild biatrial enlargement    Patient Active Problem List   Diagnosis Date Noted   Secondary hypercoagulable state (HCC) 11/30/2021   Dyslipidemia 10/25/2017   Pap smear for cervical cancer screening 08/18/2016   Diabetic polyneuropathy associated with type 2 diabetes mellitus (HCC) 01/14/2016   Diabetic eye exam (HCC) 01/14/2016   Screening for colorectal cancer 01/14/2016   Neck pain 06/29/2015   Type 2 diabetes mellitus with complication, without long-term current use of insulin (HCC) 03/16/2015   Essential hypertension 03/16/2015   BV (bacterial vaginosis) 12/11/2014   Screening for colon cancer 12/10/2014   Keloid scar 12/10/2014   Screening for STD (sexually transmitted disease) 12/10/2014   Onychomycosis of toenail 12/10/2014   Cerumen impaction 12/10/2014   Diabetes mellitus type 2 with neurological manifestations (HCC) 08/14/2014   Blurry vision, bilateral 08/14/2014   PAF (paroxysmal atrial fibrillation) (HCC) 05/08/2013   ETOH abuse 05/02/2013   Marijuana abuse 05/02/2013   Diastolic dysfunction 05/02/2013   Depression with anxiety 05/01/2013   Subaortic membrane    HTN (hypertension)    GERD 04/30/2007    Past Surgical History:  Procedure Laterality Date  CARDIOVERSION N/A 11/04/2013   Procedure: CARDIOVERSION;  Surgeon: Peter M Martinique, MD;  Location: [facility] ENDOSCOPY;  Service: Cardiovascular;  Laterality: N/A;   CESAREAN SECTION      OB History   No obstetric history on file.      Home Medications    Prior to Admission medications   Medication Sig Start Date End Date Taking? Authorizing Provider  hydrOXYzine (ATARAX) 25 MG tablet Take 25 mg by mouth 3 (three) times daily as needed.   Yes [provider]  Accu-Chek FastClix  Lancets MISC USE AS DIRECTED 07/30/21   Ladell Pier, MD  amitriptyline (ELAVIL) 50 MG tablet TAKE 1 TABLET BY MOUTH EVERYDAY AT BEDTIME 09/01/21   Ladell Pier, MD  Blood Glucose Calibration (ACCU-CHEK AVIVA) SOLN USE AS INSTRUCTED 01/11/16   Tresa Garter, MD  Blood Glucose Monitoring Suppl (ACCU-CHEK AVIVA PLUS) w/Device KIT USE AS DIRECTED TWICE DAILY AT 8 AM AND 10 PM 01/04/16   Jegede, Marlena Clipper, MD  Calcium Carb-Cholecalciferol (CALCIUM-VITAMIN D3) 500-400 MG-UNIT TABS Take 1 tablet by mouth daily. 04/11/18   Argentina Donovan, PA-C  carvedilol (COREG) 3.125 MG tablet TAKE 1 TABLET BY MOUTH 2 TIMES DAILY. 11/22/22   Ladell Pier, MD  cetirizine (ZYRTEC) 10 MG tablet Take 1 tablet (10 mg total) by mouth daily as needed for allergies. As needed for nasal congestion 03/31/22   Ladell Pier, MD  dabigatran (PRADAXA) 150 MG CAPS capsule TAKE 1 CAPSULE BY MOUTH TWICE A DAY 12/12/22   Ladell Pier, MD  famotidine (PEPCID) 40 MG tablet Take 40 mg by mouth daily.    [provider]  fenofibrate (TRICOR) 145 MG tablet TAKE 1 TABLET BY MOUTH EVERY DAY 10/17/22   Ladell Pier, MD  ferrous sulfate 325 (65 FE) MG tablet TAKE 1 TABLET BY MOUTH THREE TIMES WEEKLY AFTER A MEAL 07/19/22   Ladell Pier, MD  folic acid (FOLVITE) 027 MCG tablet Take 800 mcg by mouth daily.    [provider]  furosemide (LASIX) 20 MG tablet TAKE 1 TABLET BY MOUTH EVERY DAY 10/17/22   Ladell Pier, MD  gabapentin (NEURONTIN) 600 MG tablet TAKE 1 TABLET BY MOUTH THREE TIMES A DAY 08/13/22   Ladell Pier, MD  glucose blood (ACCU-CHEK AVIVA PLUS) test strip Check BS 1-2 times a day 07/30/21   Ladell Pier, MD  losartan (COZAAR) 25 MG tablet TAKE 1 TABLET (25 MG TOTAL) BY MOUTH DAILY. 11/22/22   Ladell Pier, MD  metFORMIN (GLUCOPHAGE) 1000 MG tablet TAKE 1 TABLET BY MOUTH 2 TIMES DAILY WITH A MEAL. 09/12/22   Ladell Pier, MD  Multiple Vitamin  (MULTIVITAMIN WITH MINERALS) TABS tablet Take 1 tablet by mouth daily.    [provider]  Omega-3 Fatty Acids (FISH OIL) 1000 MG CPDR Take 3 g by mouth daily. Patient taking differently: Take 2 capsules by mouth daily. 04/11/18   Argentina Donovan, PA-C  omeprazole (PRILOSEC) 40 MG capsule Take 1 capsule (40 mg total) by mouth daily. 07/27/22   Ladell Pier, MD  polyethylene glycol powder (GAVILAX) 17 GM/SCOOP powder MIX 1 CAPFUL WITH LIQUID AND DRINK ONCE DAILY 10/30/19   Fulp, Cammie, MD  potassium chloride (K-DUR,KLOR-CON) 10 MEQ tablet Take 1 tablet (10 mEq total) by mouth daily. 11/06/13 04/24/19  Tresa Garter, MD    Family History Family History  Problem Relation Age of Onset   Diabetes Father  deceased   Cancer Mother 34       colon   Stroke Mother    Hypertension Sister        alive and well   Hypertension Sister        alive and well   Heart attack Brother        deceased @ 25    Social History Social History   Tobacco Use   Smoking status: Never   Smokeless tobacco: Current    Types: Snuff  Vaping Use   Vaping Use: Never used  Substance Use Topics   Alcohol use: Yes    Alcohol/week: 2.0 - 3.0 standard drinks of alcohol    Types: 2 - 3 Glasses of wine per week   Drug use: No    Types: Marijuana    Comment: Smokes marijuana weekly.  Has not used cocaine in 9 years.     Allergies   Xarelto [rivaroxaban], Amoxicillin, Ibuprofen, and Ketorolac tromethamine   Review of Systems Review of Systems  Cardiovascular:  Positive for chest pain.  Gastrointestinal:        GERD sx     Physical Exam Triage Vital Signs ED Triage Vitals  Enc Vitals Group     BP 12/22/22 0917 (!) 157/69     Pulse Rate 12/22/22 0917 (!) 54     Resp 12/22/22 0917 20     Temp 12/22/22 0917 98.1 F (36.7 C)     Temp Source 12/22/22 0917 Oral     SpO2 12/22/22 0917 98 %     Weight --      Height --      Head Circumference --      Peak Flow --      Pain  Score 12/22/22 0914 10     Pain Loc --      Pain Edu? --      Excl. in [place]? --    No data found.  Updated Vital Signs BP (!) 157/69 (BP Location: Left Arm)   Pulse (!) 54   Temp 98.1 F (36.7 C) (Oral)   Resp 20   SpO2 98%   Visual Acuity Right Eye Distance:   Left Eye Distance:   Bilateral Distance:    Right Eye Near:   Left Eye Near:    Bilateral Near:     Physical Exam Vitals and nursing note reviewed.  Constitutional:      Appearance: Normal appearance.  HENT:     Head: Normocephalic and atraumatic.     Right Ear: Tympanic membrane is not perforated or erythematous.     Left Ear: Tympanic membrane is not perforated or erythematous.     Ears:     Comments: Mild cerumen in bilateral canals.  TM visualized and is within normal limits Eyes:     Pupils: Pupils are equal, round, and reactive to light.  Cardiovascular:     Rate and Rhythm: Regular rhythm. Bradycardia present.     Heart sounds: Murmur heard.  Pulmonary:     Effort: Pulmonary effort is normal.     Breath sounds: Normal breath sounds.  Neurological:     Mental Status: She is alert.      UC Treatments / Results  Labs (all labs ordered are listed, but only abnormal results are displayed) Labs Reviewed - No data to display  EKG   Radiology No results found.  Procedures ED EKG  Date/Time: 12/22/2022 10:36 AM  Performed by: Melynda Ripple, NP Authorized by:  Melynda Ripple, NP   ECG reviewed by ED Physician in the absence of a cardiologist: no   Previous ECG:    Previous ECG:  Compared to current Comments:     Sinus bradycardia at 57 beats a minute, PACs and T wave abnormalities.  (including critical care time)  Medications Ordered in UC Medications  alum & mag hydroxide-simeth (MAALOX/MYLANTA) 200-200-20 MG/5ML suspension 30 mL (30 mLs Oral Given 12/22/22 1007)  lidocaine (XYLOCAINE) 2 % viscous mouth solution 15 mL (15 mLs Mouth/Throat Given 12/22/22 1007)    Initial Impression / Assessment  and Plan / UC Course  I have reviewed the triage vital signs and the nursing notes.  Pertinent labs & imaging results that were available during my care of the patient were reviewed by me and considered in my medical decision making (see chart for details).     I reviewed exam and symptoms with patient. May use Flonase OTC for left tinnitus She was given GI cocktail with improvement of her symptoms from an 8 out of 10 to a 5 out of 10.  I discussed with her concern for possible atypical presentation for cardiac cause especially given her medical history and family history.  While she did get some improvement with her GI cocktail I still advised that she go to the ER to rule out any cardiac component to her symptoms.  Patient is in agreement with plan and will go POV to the emergency room.  She was instructed to pull over and call 911 for any worsening symptoms that occur in transit and she verbalized understanding Final Clinical Impressions(s) / UC Diagnoses   Final diagnoses:  Chest pain, unspecified type  Tinnitus of left ear     Discharge Instructions      Please go to the emergency room for further evaluation of your symptoms and to rule out any heart causes of your symptoms. You may start Flonase daily for your left ear ringing     ED Prescriptions   None    PDMP not reviewed this encounter.   Melynda Ripple, NP 12/22/22 1038

## 2022-12-22 NOTE — Discharge Instructions (Signed)
Please go to the emergency room for further evaluation of your symptoms and to rule out any heart causes of your symptoms. You may start Flonase daily for your left ear ringing

## 2022-12-22 NOTE — Discharge Instructions (Signed)
All your lab results today look good.  You can try to half your metoprolol and follow your sugars.  If they remain okay then you can do 500 twice a day.  You were given a prescription for medication called Carafate.  Hopefully that will help with your acid reflux.  You can dissolve it in water or take it as a full tablet but you take it approximately 30 minutes before you eat and before you go to bed.

## 2022-12-22 NOTE — ED Triage Notes (Signed)
Patient here POV from Home.  Endorses Mid Chest Pain that has been present for some weeks but worsened over the past few days. Burning-Like.  Intermittent in nature. No SOB. No URI Symptoms.   NAD Noted during Triage. A&Ox4. GCS 15. Ambulatory.

## 2022-12-22 NOTE — ED Notes (Signed)
Patient is being discharged from the Urgent Care and sent to the Emergency Department via POV . Per Rosario Adie NP, patient is in need of higher level of care due to epigastric pain, and need for further evaluation. Patient is aware and verbalizes understanding of plan of care.  Vitals:   12/22/22 0917  BP: (!) 157/69  Pulse: (!) 54  Resp: 20  Temp: 98.1 F (36.7 C)  SpO2: 98%

## 2022-12-22 NOTE — ED Triage Notes (Signed)
Pt c/o ringing in her left ear (since Monday).   Home interventions: ear drops, peroxide   The patient also reports having indigestion.   Home interventions: omeprazole

## 2022-12-29 ENCOUNTER — Encounter (HOSPITAL_COMMUNITY): Payer: Self-pay | Admitting: *Deleted

## 2023-01-05 ENCOUNTER — Other Ambulatory Visit: Payer: Self-pay | Admitting: Internal Medicine

## 2023-01-05 DIAGNOSIS — I10 Essential (primary) hypertension: Secondary | ICD-10-CM

## 2023-01-16 ENCOUNTER — Other Ambulatory Visit: Payer: Self-pay | Admitting: Family Medicine

## 2023-01-16 DIAGNOSIS — Z1231 Encounter for screening mammogram for malignant neoplasm of breast: Secondary | ICD-10-CM

## 2023-01-25 ENCOUNTER — Other Ambulatory Visit: Payer: Self-pay | Admitting: Internal Medicine

## 2023-01-25 DIAGNOSIS — E785 Hyperlipidemia, unspecified: Secondary | ICD-10-CM

## 2023-01-25 DIAGNOSIS — J302 Other seasonal allergic rhinitis: Secondary | ICD-10-CM

## 2023-01-25 NOTE — Telephone Encounter (Signed)
Requested Prescriptions  Pending Prescriptions Disp Refills   cetirizine (ZYRTEC) 10 MG tablet [Pharmacy Med Name: CETIRIZINE HCL 10 MG TABLET] 90 tablet 0    Sig: TAKE 1 TABLET (10 MG TOTAL) BY MOUTH DAILY AS NEEDED FOR ALLERGIES. AS NEEDED FOR NASAL CONGESTION     Ear, Nose, and Throat:  Antihistamines 2 Passed - 01/25/2023 12:46 PM      Passed - Cr in normal range and within 360 days    Creat  Date Value Ref Range Status  08/18/2016 0.64 0.50 - 1.05 mg/dL Final    Comment:      For patients > or = 63 years of age: The upper reference limit for Creatinine is approximately 13% higher for people identified as African-American.      Creatinine, Ser  Date Value Ref Range Status  12/22/2022 0.91 0.44 - 1.00 mg/dL Final   Creatinine, POC  Date Value Ref Range Status  04/26/2017 50 mg/dL Final   Creatinine, Urine  Date Value Ref Range Status  12/10/2014 38.8 mg/dL Final    Comment:    No reference range established.         Passed - Valid encounter within last 12 months    Recent Outpatient Visits           5 months ago Type 2 diabetes mellitus with diabetic neuropathy, without long-term current use of insulin ([place])   [place] Karle Plumber B, MD   10 months ago Type 2 diabetes mellitus with diabetic neuropathy, without long-term current use of insulin ([place])   [place] Karle Plumber B, MD   1 year ago Type 2 diabetes mellitus with diabetic neuropathy, without long-term current use of insulin ([place])   [place] Karle Plumber B, MD   1 year ago Type 2 diabetes mellitus with diabetic neuropathy, without long-term current use of insulin ([place])   [place] Karle Plumber B, MD   2 years ago Type 2 diabetes mellitus with complication, without long-term current use of insulin ([place])   [place] Fleming, Vernia Buff, NP       Future Appointments             In 6 days Ladell Pier, MD [place]             fenofibrate (TRICOR) 145 MG tablet [Pharmacy Med Name: FENOFIBRATE 145 MG TABLET] 90 tablet 0    Sig: TAKE 1 TABLET BY MOUTH EVERY DAY     Cardiovascular:  Antilipid - Fibric Acid Derivatives Failed - 01/25/2023 12:46 PM      Failed - HGB in normal range and within 360 days    Hemoglobin  Date Value Ref Range Status  12/22/2022 11.8 (L) 12.0 - 15.0 g/dL Final  05/17/2022 12.8 11.1 - 15.9 g/dL Final         Failed - HCT in normal range and within 360 days    HCT  Date Value Ref Range Status  12/22/2022 34.9 (L) 36.0 - 46.0 % Final   Hematocrit  Date Value Ref Range Status  05/17/2022 38.5 34.0 - 46.6 % Final         Failed - Lipid Panel in normal range within the last 12 months    Cholesterol, Total  Date Value Ref Range Status  08/01/2022 140 100 -  199 mg/dL Final   LDL Chol Calc (NIH)  Date Value Ref Range Status  08/01/2022 59 0 - 99 mg/dL Final   HDL  Date Value Ref Range Status  08/01/2022 63 >39 mg/dL Final   Triglycerides  Date Value Ref Range Status  08/01/2022 95 0 - 149 mg/dL Final         Passed - ALT in normal range and within 360 days    ALT  Date Value Ref Range Status  05/13/2022 14 0 - 32 IU/L Final         Passed - AST in normal range and within 360 days    AST  Date Value Ref Range Status  05/13/2022 15 0 - 40 IU/L Final         Passed - Cr in normal range and within 360 days    Creat  Date Value Ref Range Status  08/18/2016 0.64 0.50 - 1.05 mg/dL Final    Comment:      For patients > or = 63 years of age: The upper reference limit for Creatinine is approximately 13% higher for people identified as African-American.      Creatinine, Ser  Date Value Ref Range Status  12/22/2022 0.91 0.44 - 1.00 mg/dL Final   Creatinine, POC  Date Value Ref Range Status  04/26/2017  50 mg/dL Final   Creatinine, Urine  Date Value Ref Range Status  12/10/2014 38.8 mg/dL Final    Comment:    No reference range established.         Passed - PLT in normal range and within 360 days    Platelets  Date Value Ref Range Status  12/22/2022 197 150 - 400 K/uL Final  05/17/2022 209 150 - 450 x10E3/uL Final         Passed - WBC in normal range and within 360 days    WBC  Date Value Ref Range Status  12/22/2022 4.3 4.0 - 10.5 K/uL Final         Passed - eGFR is 30 or above and within 360 days    GFR, Est African American  Date Value Ref Range Status  08/18/2016 >89 >=60 mL/min Final   GFR calc Af Amer  Date Value Ref Range Status  11/09/2020 94 >59 mL/min/1.73 Final    Comment:    **In accordance with recommendations from the NKF-ASN Task force,**   [facility] is in the process of updating its eGFR calculation to the   2021 CKD-EPI creatinine equation that estimates kidney function   without a race variable.    GFR, Est Non African American  Date Value Ref Range Status  08/18/2016 >89 >=60 mL/min Final   GFR, Estimated  Date Value Ref Range Status  12/22/2022 >60 >60 mL/min Final    Comment:    (NOTE) Calculated using the CKD-EPI Creatinine Equation (2021)    GFR  Date Value Ref Range Status  10/30/2013 120.29 >60.00 mL/min Final   eGFR  Date Value Ref Range Status  05/13/2022 84 >59 mL/min/1.73 Final         Passed - Valid encounter within last 12 months    Recent Outpatient Visits           5 months ago Type 2 diabetes mellitus with diabetic neuropathy, without long-term current use of insulin [facility])   [place] Karle Plumber B, MD   10 months ago Type 2 diabetes mellitus with diabetic neuropathy, without long-term current  use of insulin ([place])   [place] Karle Plumber B, MD   1 year ago Type 2 diabetes mellitus with diabetic neuropathy, without long-term current use  of insulin [facility])   [place] Karle Plumber B, MD   1 year ago Type 2 diabetes mellitus with diabetic neuropathy, without long-term current use of insulin [facility])   [place] Karle Plumber B, MD   2 years ago Type 2 diabetes mellitus with complication, without long-term current use of insulin [facility])   [place], Vernia Buff, NP       Future Appointments             In 6 days Ladell Pier, MD [place]

## 2023-01-31 ENCOUNTER — Encounter: Payer: Self-pay | Admitting: Internal Medicine

## 2023-01-31 ENCOUNTER — Ambulatory Visit: Payer: Medicaid Other | Attending: Internal Medicine | Admitting: Internal Medicine

## 2023-01-31 VITALS — BP 135/80 | HR 59 | Temp 98.6°F | Ht 67.0 in | Wt 174.0 lb

## 2023-01-31 DIAGNOSIS — E1140 Type 2 diabetes mellitus with diabetic neuropathy, unspecified: Secondary | ICD-10-CM

## 2023-01-31 DIAGNOSIS — E785 Hyperlipidemia, unspecified: Secondary | ICD-10-CM | POA: Diagnosis not present

## 2023-01-31 DIAGNOSIS — D649 Anemia, unspecified: Secondary | ICD-10-CM

## 2023-01-31 DIAGNOSIS — I152 Hypertension secondary to endocrine disorders: Secondary | ICD-10-CM

## 2023-01-31 DIAGNOSIS — E1159 Type 2 diabetes mellitus with other circulatory complications: Secondary | ICD-10-CM

## 2023-01-31 DIAGNOSIS — L659 Nonscarring hair loss, unspecified: Secondary | ICD-10-CM | POA: Diagnosis not present

## 2023-01-31 DIAGNOSIS — I480 Paroxysmal atrial fibrillation: Secondary | ICD-10-CM

## 2023-01-31 DIAGNOSIS — E663 Overweight: Secondary | ICD-10-CM | POA: Diagnosis not present

## 2023-01-31 DIAGNOSIS — E1169 Type 2 diabetes mellitus with other specified complication: Secondary | ICD-10-CM

## 2023-01-31 LAB — POCT GLYCOSYLATED HEMOGLOBIN (HGB A1C): HbA1c, POC (controlled diabetic range): 5.9 % (ref 0.0–7.0)

## 2023-01-31 LAB — GLUCOSE, POCT (MANUAL RESULT ENTRY): POC Glucose: 111 mg/dl — AB (ref 70–99)

## 2023-01-31 NOTE — Progress Notes (Signed)
Patient ID: Jane Arellano, female    DOB: [birth year]  MRN: [id number]  CC: Diabetes (DM f/u. Med refills. /Pt had ER visit in Feb. Ringing in ear. Jane Arellano received flu vax. Unable to get colonscopy due to afib - pt requesting alternative.)   Subjective: Jane Arellano is a 62 y.o. 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 Reports she is doing well with her eating habits and exercises every other day.  She discontinued taking Elavil.  States that it makes her feel weird when she takes it. Compliant with taking Tricor.  Anemia: Taking iron supplement 3 times a week.  Most recent CBC showed slight decrease in H&H 11.8/34.9.  Previously it was 12.7/37.9.  Denies any dizziness.  HTN/PAF: Has upcoming appointment with cardiology later this month.  No bruising or bleeding on Pradaxa.  Compliant with taking carvedilol 3.125 mg twice a day and Cozaar 25 mg daily. -Had received letter of recall for repeat colonoscopy from [facility] in July of last year.  She went for the procedure but had to be terminated because she developed rapid A-fib. She subsequently touch base with the gastroenterologist and states she was offered another method for screening which sounds like Cologuard.  She reports being told that she would have a co-pay of about $200 which she states she cannot afford.  Wanting to know if there is anything she can do about wellness on her scalp.  She braids her hair.  Has had significant hair loss over the years.  Patient Active Problem List   Diagnosis Date Noted   Secondary hypercoagulable state ([place]) 11/30/2021   Dyslipidemia 10/25/2017   Pap smear for  cervical cancer screening 08/18/2016   Diabetic polyneuropathy associated with type 2 diabetes mellitus ([place]) 01/14/2016   Diabetic eye exam ([place]) 01/14/2016   Screening for colorectal cancer 01/14/2016   Neck pain 06/29/2015   Type 2 diabetes mellitus with complication, without long-term current use of insulin ([place]) 03/16/2015   Essential hypertension 03/16/2015   BV (bacterial vaginosis) 12/11/2014   Screening for colon cancer 12/10/2014   Keloid scar 12/10/2014   Screening for STD (sexually transmitted disease) 12/10/2014   Onychomycosis of toenail 12/10/2014   Cerumen impaction 12/10/2014   Diabetes mellitus type 2 with neurological manifestations ([place]) 08/14/2014   Blurry vision, bilateral 08/14/2014   PAF (paroxysmal atrial fibrillation) ([place]) 05/08/2013   ETOH abuse 05/02/2013   Marijuana abuse [id number]   Diastolic dysfunction [id number]   Depression with anxiety 05/01/2013   Subaortic membrane    HTN (hypertension)    GERD 04/30/2007     Current Outpatient Medications on File Prior to Visit  Medication Sig Dispense Refill   Accu-Chek FastClix Lancets MISC USE AS DIRECTED 100 each 11   amitriptyline (ELAVIL) 50 MG tablet TAKE 1 TABLET BY MOUTH EVERYDAY AT BEDTIME 90 tablet 0   Blood Glucose Calibration (ACCU-CHEK AVIVA) SOLN USE AS INSTRUCTED 1 each 0   Blood Glucose Monitoring Suppl (ACCU-CHEK AVIVA PLUS) w/Device KIT USE AS DIRECTED TWICE DAILY AT 8 AM AND 10 PM 1 kit 0   Calcium Carb-Cholecalciferol (CALCIUM-VITAMIN D3) 500-400 MG-UNIT TABS Take 1 tablet by mouth daily. [place]  tablet 5   carvedilol (COREG) 3.125 MG tablet TAKE 1 TABLET BY MOUTH 2 TIMES DAILY. 180 tablet 0   cetirizine (ZYRTEC) 10 MG tablet TAKE 1 TABLET (10 MG TOTAL) BY MOUTH DAILY AS NEEDED FOR ALLERGIES. AS NEEDED FOR NASAL CONGESTION 90 tablet 0   dabigatran (PRADAXA) 150 MG CAPS capsule TAKE 1 CAPSULE BY MOUTH TWICE A DAY 180 capsule 0   famotidine (PEPCID) 40 MG tablet TAKE 1 TABLET BY MOUTH EVERYDAY  AT BEDTIME 90 tablet 0   fenofibrate (TRICOR) 145 MG tablet TAKE 1 TABLET BY MOUTH EVERY DAY 90 tablet 0   ferrous sulfate 325 (65 FE) MG tablet TAKE 1 TABLET BY MOUTH THREE TIMES WEEKLY AFTER A MEAL 36 tablet 0   folic acid (FOLVITE) Q000111Q MCG tablet Take 800 mcg by mouth daily.     furosemide (LASIX) 20 MG tablet TAKE 1 TABLET BY MOUTH EVERY DAY 90 tablet 0   gabapentin (NEURONTIN) 600 MG tablet TAKE 1 TABLET BY MOUTH THREE TIMES A DAY 90 tablet 6   glucose blood (ACCU-CHEK AVIVA PLUS) test strip Check BS 1-2 times a day 100 each 11   hydrOXYzine (ATARAX) 25 MG tablet Take 25 mg by mouth 3 (three) times daily as needed.     losartan (COZAAR) 25 MG tablet TAKE 1 TABLET (25 MG TOTAL) BY MOUTH DAILY. 90 tablet 0   metFORMIN (GLUCOPHAGE) 1000 MG tablet TAKE 1 TABLET BY MOUTH 2 TIMES DAILY WITH A MEAL. 180 tablet 1   Multiple Vitamin (MULTIVITAMIN WITH MINERALS) TABS tablet Take 1 tablet by mouth daily.     Omega-3 Fatty Acids (FISH OIL) 1000 MG CPDR Take 3 g by mouth daily. (Patient taking differently: Take 2 capsules by mouth daily.) 90 capsule 6   omeprazole (PRILOSEC) 40 MG capsule Take 1 capsule (40 mg total) by mouth daily. 90 capsule 2   polyethylene glycol powder (GAVILAX) 17 GM/SCOOP powder MIX 1 CAPFUL WITH LIQUID AND DRINK ONCE DAILY 476 g 6   sucralfate (CARAFATE) 1 g tablet Take 1 tablet (1 g total) by mouth 4 (four) times daily -  with meals and at bedtime. You can swallow the pill or dissolve in water 90 tablet 0   [DISCONTINUED] potassium chloride (K-DUR,KLOR-CON) 10 MEQ tablet Take 1 tablet (10 mEq total) by mouth daily. 30 tablet 0   No current facility-administered medications on file prior to visit.    Allergies  Allergen Reactions   Xarelto [Rivaroxaban] Swelling    Gums bled, headaches   Amoxicillin    Ibuprofen    Ketorolac Tromethamine Other (See Comments)    Unknown reaction    Social History   Socioeconomic History   Marital status: Single    Spouse name: Not on  file   Number of children: Not on file   Years of education: Not on file   Highest education level: Not on file  Occupational History   Not on file  Tobacco Use   Smoking status: Never   Smokeless tobacco: Current    Types: Snuff  Vaping Use   Vaping Use: Never used  Substance and Sexual Activity   Alcohol use: Not Currently    Alcohol/week: 2.0 - 3.0 standard drinks of alcohol    Types: 2 - 3 Glasses of wine per week   Drug use: No    Types: Marijuana    Comment: Smokes marijuana weekly.  Has not used cocaine in 9 years.   Sexual activity: Yes    Birth control/protection:  None  Other Topics Concern   Not on file  Social History Narrative   Lives in [place] by herself.  She had been caring for her mother but she died 2 mos ago.  She tries to remain active but does not regularly exercise.   Social Determinants of Health   Financial Resource Strain: Not on file  Food Insecurity: Not on file  Transportation Needs: Not on file  Physical Activity: Not on file  Stress: Not on file  Social Connections: Not on file  Intimate Partner Violence: Not on file    Family History  Problem Relation Age of Onset   Diabetes Father        deceased   Cancer Mother 37       colon   Stroke Mother    Hypertension Sister        alive and well   Hypertension Sister        alive and well   Heart attack Brother        deceased @ 63    Past Surgical History:  Procedure Laterality Date   CARDIOVERSION N/A 11/04/2013   Procedure: CARDIOVERSION;  Surgeon: Peter M Martinique, MD;  Location: [facility] ENDOSCOPY;  Service: Cardiovascular;  Laterality: N/A;   CESAREAN SECTION      ROS: Review of Systems Negative except as stated above  PHYSICAL EXAM: BP 135/80 (BP Location: Left Arm, Patient Position: Sitting, Cuff Size: Normal)   Pulse (!) 59   Temp 98.6 F (37 C) (Oral)   Ht '5\' 7"'$  (1.702 m)   Wt 174 lb (78.9 kg)   SpO2 100%   BMI 27.25 kg/m   Physical Exam  General appearance - alert, well  appearing, and in no distress Mental status - normal mood, behavior, speech, dress, motor activity, and thought processes Neck - supple, no significant adenopathy Chest - clear to auscultation, no wheezes, rales or rhonchi, symmetric air entry Heart -  Sounds to be in SR. normal rate, regular rhythm, normal S1, S2, no murmurs, rubs, clicks or gallops Extremities - peripheral pulses normal, no pedal edema, no clubbing or cyanosis Skin: She currently has braids in her hair.  She has baldness in both temporal areas with significant receding hairline.  She has bald patches throughout the hair as well.     Latest Ref Rng & Units 12/22/2022   11:25 AM 08/10/2022    1:32 PM 05/13/2022   11:22 AM  CMP  Glucose 70 - 99 mg/dL 106  95  117   BUN 8 - 23 mg/dL '12  9  10   '$ Creatinine 0.44 - 1.00 mg/dL 0.91  0.87  0.80   Sodium 135 - 145 mmol/L 136  137  141   Potassium 3.5 - 5.1 mmol/L 3.9  3.7  4.2   Chloride 98 - 111 mmol/L 100  103  102   CO2 22 - 32 mmol/L '26  22  23   '$ Calcium 8.9 - 10.3 mg/dL 10.6  10.0  10.5   Total Protein 6.0 - 8.5 g/dL   7.7   Total Bilirubin 0.0 - 1.2 mg/dL   0.3   Alkaline Phos 44 - 121 IU/L   44   AST 0 - 40 IU/L   15   ALT 0 - 32 IU/L   14    Lipid Panel     Component Value Date/Time   CHOL 140 08/01/2022 1638   TRIG 95 08/01/2022 1638   HDL 63 08/01/2022 1638  CHOLHDL 2.2 08/01/2022 1638   CHOLHDL 2.9 09/12/2016 0939   VLDL NOT CALC 09/12/2016 0939   LDLCALC 59 08/01/2022 1638    CBC    Component Value Date/Time   WBC 4.3 12/22/2022 1125   RBC 3.71 (L) 12/22/2022 1125   HGB 11.8 (L) 12/22/2022 1125   HGB 12.8 05/17/2022 1201   HCT 34.9 (L) 12/22/2022 1125   HCT 38.5 05/17/2022 1201   PLT 197 12/22/2022 1125   PLT 209 05/17/2022 1201   MCV 94.1 12/22/2022 1125   MCV 97 05/17/2022 1201   MCH 31.8 12/22/2022 1125   MCHC 33.8 12/22/2022 1125   RDW 12.5 12/22/2022 1125   RDW 13.1 05/17/2022 1201   LYMPHSABS 1.8 05/17/2022 1201   MONOABS 246  08/18/2016 1114   EOSABS 0.1 05/17/2022 1201   BASOSABS 0.0 05/17/2022 1201    ASSESSMENT AND PLAN:  1. Type 2 diabetes mellitus with diabetic neuropathy, without long-term current use of insulin (HCC) At goal.  Continue metformin 1 g twice a day, healthy eating habits and regular exercise. - POCT glucose (manual entry) - POCT glycosylated hemoglobin (Hb A1C) - Microalbumin / creatinine urine ratio  2. Hypertension associated with diabetes ([place]) Close to goal.  Continue carvedilol 3.25 mg twice a day and Cozaar 25 mg daily  3. PAF (paroxysmal atrial fibrillation) (HCC) Currently in sinus based on auscultation.  Continue Pradaxa and carvedilol  4. Hyperlipidemia associated with type 2 diabetes mellitus (HCC) Continue Tricor.  5. Overweight (BMI 25.0-29.9) See #1 above  6. Normocytic anemia - Iron, TIBC and Ferritin Panel  7. Alopecia Advised that she may want to stop braiding the hair as this can contribute to her hair loss when it is braided too tight.  Recommend referral to dermatology.  Patient want to hold off for now.    Patient was given the opportunity to ask questions.  Patient verbalized understanding of the plan and was able to repeat key elements of the plan.   This documentation was completed using Radio producer.  Any transcriptional errors are unintentional.  Orders Placed This Encounter  Procedures   POCT glucose (manual entry)   POCT glycosylated hemoglobin (Hb A1C)     Requested Prescriptions    No prescriptions requested or ordered in this encounter    No follow-ups on file.  Karle Plumber, MD, FACP

## 2023-02-13 ENCOUNTER — Ambulatory Visit: Payer: Medicaid Other | Admitting: Internal Medicine

## 2023-02-13 ENCOUNTER — Encounter (HOSPITAL_COMMUNITY): Payer: Self-pay | Admitting: Physician Assistant

## 2023-02-13 ENCOUNTER — Ambulatory Visit: Payer: Medicaid Other | Attending: Family Medicine

## 2023-02-13 ENCOUNTER — Ambulatory Visit (HOSPITAL_COMMUNITY)
Admission: RE | Admit: 2023-02-13 | Discharge: 2023-02-13 | Disposition: A | Discharge status: Home / Self Care | Payer: Medicaid Other | Source: Ambulatory Visit | Attending: Physician Assistant | Admitting: Physician Assistant

## 2023-02-13 VITALS — BP 104/66 | HR 64 | Ht 67.0 in | Wt 176.4 lb

## 2023-02-13 DIAGNOSIS — I352 Nonrheumatic aortic (valve) stenosis with insufficiency: Secondary | ICD-10-CM | POA: Insufficient documentation

## 2023-02-13 DIAGNOSIS — D6869 Other thrombophilia: Secondary | ICD-10-CM | POA: Diagnosis not present

## 2023-02-13 DIAGNOSIS — F17220 Nicotine dependence, chewing tobacco, uncomplicated: Secondary | ICD-10-CM | POA: Diagnosis not present

## 2023-02-13 DIAGNOSIS — D649 Anemia, unspecified: Secondary | ICD-10-CM | POA: Diagnosis not present

## 2023-02-13 DIAGNOSIS — E1142 Type 2 diabetes mellitus with diabetic polyneuropathy: Secondary | ICD-10-CM | POA: Diagnosis not present

## 2023-02-13 DIAGNOSIS — Z8616 Personal history of COVID-19: Secondary | ICD-10-CM | POA: Diagnosis not present

## 2023-02-13 DIAGNOSIS — E1140 Type 2 diabetes mellitus with diabetic neuropathy, unspecified: Secondary | ICD-10-CM | POA: Diagnosis not present

## 2023-02-13 DIAGNOSIS — I10 Essential (primary) hypertension: Secondary | ICD-10-CM | POA: Diagnosis not present

## 2023-02-13 DIAGNOSIS — I480 Paroxysmal atrial fibrillation: Secondary | ICD-10-CM | POA: Diagnosis not present

## 2023-02-13 NOTE — Progress Notes (Signed)
Primary Care Physician: Ladell Pier, MD Referring Physician: Dr. Candy Sledge Jane Arellano is a 62 y.o. female with a h/o PAF in the afib clinic for evaluation. Dx'ed 05/05/15, asymtpomatic, picked up on physical exam. Apparently very little afib burden since then.  She reports that she feels well other than dealing with peripheral neuropathy from her DM. She has not noticed any reoccurring afib. Continues on DOAC with a   CHA2DS2-VASc Score of at least 3.   Lifestyle habits reviewed and pt reports very little caffeine, no alcohol, walks daily. Denies snoring. Discussed avoiding decongestants going into cold/flu season.  F/u in afib clinic, 04/16/20. She states that she feels great. No issues with afib. Recently, saw PCP for physical with labs.   In afib clinic, 01/28/21. She reports that she feels good. No afib to report. No issues with pradaxa with a CHA2DS2VASc score of 3. Will repeat echo for h/o mild aortic  stenosis with mild to mod on echo in 2020. She did get covid form her daughter in February 2022,  but just had  cold symptoms. Her daughter, unvaccinated did not fare so well, was hospitalized with covid pneumonia, but is on the mend now.   F/u in afib clinic, 08/04/21. She reports that she is feeling well. No afib to report. Just had her physical and got a good report. NSR on EKG.   F/u in afib clinic, 08/17/22. She has no complaints today. She recently  developed afib with RVR at time of colonoscopy so procedure was cancelled and she was sent to the ER. She was given IV cardizem and she converted. No further afib.  Ekg shows SR.   Follow up in the AF clinic 02/13/23. Patient reports that she has done well since her last visit with no tachypalpitations. No bleeding issues on anticoagulation.   Today, she denies symptoms of palpitations, chest pain, shortness of breath, orthopnea, PND, lower extremity edema, dizziness, presyncope, syncope, or neurologic sequela. The patient is  tolerating medications without difficulties and is otherwise without complaint today.   Past Medical History:  Diagnosis Date   Atypical chest pain    a. 11/2005 Negative Myoview   Diastolic dysfunction    DM2 (diabetes mellitus, type 2) (HCC)    Dysrhythmia    AFIB HX CV    GERD (gastroesophageal reflux disease)    Heart murmur    History of substance abuse (HCC)    HTN (hypertension)    Iron deficiency anemia    Paroxysmal atrial fibrillation (HCC) 05/01/2013   On Xarelto   Subaortic membrane    a. 01/2010 Echo: EF 55-60%, No rwma, subaortic membrane with elevated LVOT mean gradient of 21 mmHg, Triv AI, mod dil LA, mildly increased PASP. b. 05/02/2013 Echo:  LVEF [id number], grade 1 diastolic dysfunction, mild LVH, subaortic stenosis w/ turbulation in LVOT c/w subaortic membrane (mean gradient 42 mmHg/peak gradient 81 mmHg), mild biatrial enlargement   Past Surgical History:  Procedure Laterality Date   CARDIOVERSION N/A 11/04/2013   Procedure: CARDIOVERSION;  Surgeon: Peter M Martinique, MD;  Location: [facility];  Service: Cardiovascular;  Laterality: N/A;   CESAREAN SECTION      Current Outpatient Medications  Medication Sig Dispense Refill   Accu-Chek FastClix Lancets MISC USE AS DIRECTED 100 each 11   Blood Glucose Calibration (ACCU-CHEK AVIVA) SOLN USE AS INSTRUCTED 1 each 0   Blood Glucose Monitoring Suppl (ACCU-CHEK AVIVA PLUS) w/Device KIT USE AS DIRECTED TWICE DAILY AT 8 AM AND  10 PM 1 kit 0   Calcium Carb-Cholecalciferol (CALCIUM-VITAMIN D3) 500-400 MG-UNIT TABS Take 1 tablet by mouth daily. 60 tablet 5   carvedilol (COREG) 3.125 MG tablet TAKE 1 TABLET BY MOUTH 2 TIMES DAILY. 180 tablet 0   cetirizine (ZYRTEC) 10 MG tablet TAKE 1 TABLET (10 MG TOTAL) BY MOUTH DAILY AS NEEDED FOR ALLERGIES. AS NEEDED FOR NASAL CONGESTION 90 tablet 0   dabigatran (PRADAXA) 150 MG CAPS capsule TAKE 1 CAPSULE BY MOUTH TWICE A DAY 180 capsule 0   famotidine (PEPCID) 40 MG tablet TAKE 1 TABLET BY  MOUTH EVERYDAY AT BEDTIME 90 tablet 0   fenofibrate (TRICOR) 145 MG tablet TAKE 1 TABLET BY MOUTH EVERY DAY 90 tablet 0   ferrous sulfate 325 (65 FE) MG tablet TAKE 1 TABLET BY MOUTH THREE TIMES WEEKLY AFTER A MEAL 36 tablet 0   folic acid (FOLVITE) Q000111Q MCG tablet Take 800 mcg by mouth daily.     furosemide (LASIX) 20 MG tablet TAKE 1 TABLET BY MOUTH EVERY DAY 90 tablet 0   gabapentin (NEURONTIN) 600 MG tablet TAKE 1 TABLET BY MOUTH THREE TIMES A DAY 90 tablet 6   glucose blood (ACCU-CHEK AVIVA PLUS) test strip Check BS 1-2 times a day 100 each 11   hydrOXYzine (ATARAX) 25 MG tablet Take 25 mg by mouth 3 (three) times daily as needed.     losartan (COZAAR) 25 MG tablet TAKE 1 TABLET (25 MG TOTAL) BY MOUTH DAILY. 90 tablet 0   metFORMIN (GLUCOPHAGE) 1000 MG tablet TAKE 1 TABLET BY MOUTH 2 TIMES DAILY WITH A MEAL. 180 tablet 1   Multiple Vitamin (MULTIVITAMIN WITH MINERALS) TABS tablet Take 1 tablet by mouth daily.     Omega-3 Fatty Acids (FISH OIL) 1000 MG CPDR Take 3 g by mouth daily. (Patient taking differently: Take 2 capsules by mouth daily.) 90 capsule 6   omeprazole (PRILOSEC) 40 MG capsule Take 1 capsule (40 mg total) by mouth daily. 90 capsule 2   polyethylene glycol powder (GAVILAX) 17 GM/SCOOP powder MIX 1 CAPFUL WITH LIQUID AND DRINK ONCE DAILY 476 g 6   sucralfate (CARAFATE) 1 g tablet Take 1 tablet (1 g total) by mouth 4 (four) times daily -  with meals and at bedtime. You can swallow the pill or dissolve in water 90 tablet 0   No current facility-administered medications for this encounter.    Allergies  Allergen Reactions   Xarelto [Rivaroxaban] Swelling    Gums bled, headaches   Amoxicillin    Ibuprofen    Ketorolac Tromethamine Other (See Comments)    Unknown reaction    Social History   Socioeconomic History   Marital status: Single    Spouse name: Not on file   Number of children: Not on file   Years of education: Not on file   Highest education level: Not on  file  Occupational History   Not on file  Tobacco Use   Smoking status: Never   Smokeless tobacco: Current    Types: Snuff   Tobacco comments:    Currently snuff 02/13/23  Vaping Use   Vaping Use: Never used  Substance and Sexual Activity   Alcohol use: Not Currently    Alcohol/week: 2.0 - 3.0 standard drinks of alcohol    Types: 2 - 3 Glasses of wine per week   Drug use: No    Types: Marijuana    Comment: Smokes marijuana weekly.  Has not used cocaine in 9 years.  Sexual activity: Yes    Birth control/protection: None  Other Topics Concern   Not on file  Social History Narrative   Lives in [place] by herself.  She had been caring for her mother but she died 2 mos ago.  She tries to remain active but does not regularly exercise.   Social Determinants of Health   Financial Resource Strain: Not on file  Food Insecurity: Not on file  Transportation Needs: Not on file  Physical Activity: Not on file  Stress: Not on file  Social Connections: Not on file  Intimate Partner Violence: Not on file    Family History  Problem Relation Age of Onset   Diabetes Father        deceased   Cancer Mother 4       colon   Stroke Mother    Hypertension Sister        alive and well   Hypertension Sister        alive and well   Heart attack Brother        deceased @ 62    ROS- All systems are reviewed and negative except as per the HPI above  Physical Exam: Vitals:   02/13/23 0904  BP: 104/66  Pulse: 64  Weight: 80 kg  Height: 5\' 7"  (1.702 m)    GEN- The patient is a well appearing female, alert and oriented x 3 today.   HEENT-head normocephalic, atraumatic, sclera clear, conjunctiva pink, hearing intact, trachea midline. Lungs- Clear to ausculation bilaterally, normal work of breathing Heart- Regular rate and rhythm, no rubs or gallops, 2/6 systolic murmur  GI- soft, NT, ND, + BS Extremities- no clubbing, cyanosis, or edema MS- no significant deformity or atrophy Skin- no  rash or lesion Psych- euthymic mood, full affect Neuro- strength and sensation are intact   EKG today demonstrates SR Vent. rate 64 BPM PR interval 164 ms QRS duration 86 ms QT/QTcB 440/453 ms  Epic records reviewed  Echo- 02/26/21  1. Left ventricular ejection fraction, by estimation, is 55 to 60%. The  left ventricle has normal function. The left ventricle has no regional  wall motion abnormalities. Left ventricular diastolic parameters were  normal.   2. Right ventricular systolic function is low normal. The right  ventricular size is normal. There is normal pulmonary artery systolic  pressure.   3. Left atrial size was moderately dilated.   4. Right atrial size was mildly dilated.   5. The mitral valve is normal in structure. No evidence of mitral valve  regurgitation.   6. The aortic valve is tricuspid. Aortic valve regurgitation is mild to  moderate. Mild aortic valve stenosis. Aortic valve mean gradient measures  11.7 mmHg.   7. Aortic dilatation noted. There is borderline dilatation of the  ascending aorta, measuring 38 mm.   8. The inferior vena cava is normal in size with greater than 50%  respiratory variability, suggesting right atrial pressure of 3 mmHg.   Comparison(s): No significant change from prior study.    CHA2DS2-VASc Score = 3  The patient's score is based upon: CHF History: 0 HTN History: 1 Diabetes History: 1 Stroke History: 0 Vascular Disease History: 0 Age Score: 0 Gender Score: 1       ASSESSMENT AND PLAN: 1. Paroxysmal Atrial Fibrillation (ICD10:  I48.0) The patient's CHA2DS2-VASc score is 3, indicating a 3.2% annual risk of stroke.   Patient appears to be maintaining SR. Continue carvedilol 3.125 mg BID Continue Pradaxa 150 mg  BID  2. Secondary Hypercoagulable State (ICD10:  D68.69) The patient is at significant risk for stroke/thromboembolism based upon her CHA2DS2-VASc Score of 3.  Continue Dabigatran (Pradaxa).    3.  HTN Stable, no changes today.  4. Subaortic membrane  S/p resection 2015 Mild to moderate AR Mild AS     Follow up in 6 months to establish care with a primary cardiologist. AF clinic in one year.    [facility] [address] [place],  [address] [phone]

## 2023-02-15 LAB — IRON,TIBC AND FERRITIN PANEL
Ferritin: 386 ng/mL — ABNORMAL HIGH (ref 15–150)
Iron Saturation: 18 % (ref 15–55)
Iron: 70 ug/dL (ref 27–139)
Total Iron Binding Capacity: 383 ug/dL (ref 250–450)
UIBC: 313 ug/dL (ref 118–369)

## 2023-02-15 LAB — MICROALBUMIN / CREATININE URINE RATIO
Creatinine, Urine: 38.2 mg/dL
Microalb/Creat Ratio: 9 mg/g creat (ref 0–29)
Microalbumin, Urine: 3.3 ug/mL

## 2023-02-22 ENCOUNTER — Other Ambulatory Visit: Payer: Self-pay | Admitting: Internal Medicine

## 2023-02-22 DIAGNOSIS — I152 Hypertension secondary to endocrine disorders: Secondary | ICD-10-CM

## 2023-02-23 ENCOUNTER — Other Ambulatory Visit: Payer: Self-pay | Admitting: Internal Medicine

## 2023-02-23 DIAGNOSIS — I10 Essential (primary) hypertension: Secondary | ICD-10-CM

## 2023-02-23 NOTE — Telephone Encounter (Signed)
Requested Prescriptions  Pending Prescriptions Disp Refills   carvedilol (COREG) 3.125 MG tablet [Pharmacy Med Name: CARVEDILOL 3.125 MG TABLET] 180 tablet 0    Sig: TAKE 1 TABLET BY MOUTH TWICE A DAY     Cardiovascular: Beta Blockers 3 Passed - 02/23/2023  9:10 AM      Passed - Cr in normal range and within 360 days    Creat  Date Value Ref Range Status  08/18/2016 0.64 0.50 - 1.05 mg/dL Final    Comment:      For patients > or = 63 years of age: The upper reference limit for Creatinine is approximately 13% higher for people identified as African-American.      Creatinine, Ser  Date Value Ref Range Status  12/22/2022 0.91 0.44 - 1.00 mg/dL Final   Creatinine, POC  Date Value Ref Range Status  04/26/2017 50 mg/dL Final   Creatinine, Urine  Date Value Ref Range Status  12/10/2014 38.8 mg/dL Final    Comment:    No reference range established.         Passed - AST in normal range and within 360 days    AST  Date Value Ref Range Status  05/13/2022 15 0 - 40 IU/L Final         Passed - ALT in normal range and within 360 days    ALT  Date Value Ref Range Status  05/13/2022 14 0 - 32 IU/L Final         Passed - Last BP in normal range    BP Readings from Last 1 Encounters:  02/13/23 104/66         Passed - Last Heart Rate in normal range    Pulse Readings from Last 1 Encounters:  02/13/23 64         Passed - Valid encounter within last 6 months    Recent Outpatient Visits           3 weeks ago Type 2 diabetes mellitus with diabetic neuropathy, without long-term current use of insulin ([place])   [place] Karle Plumber B, MD   6 months ago Type 2 diabetes mellitus with diabetic neuropathy, without long-term current use of insulin ([place])   [place] Karle Plumber B, MD   10 months ago Type 2 diabetes mellitus with diabetic neuropathy, without long-term current use of insulin ([facility])   [place] Karle Plumber B, MD   1 year ago Type 2 diabetes mellitus with diabetic neuropathy, without long-term current use of insulin [facility])   [place] Karle Plumber B, MD   1 year ago Type 2 diabetes mellitus with diabetic neuropathy, without long-term current use of insulin [facility])   [place], MD       Future Appointments             In 3 months Wynetta Emery, Dalbert Batman, MD [place]   In 5 months Johney Frame, Greer Ee, MD [place] [facility] [place]

## 2023-02-28 ENCOUNTER — Ambulatory Visit
Admission: RE | Admit: 2023-02-28 | Discharge: 2023-02-28 | Disposition: A | Discharge status: Home / Self Care | Payer: Medicaid Other | Source: Ambulatory Visit | Attending: Family Medicine | Admitting: Family Medicine

## 2023-02-28 DIAGNOSIS — Z1231 Encounter for screening mammogram for malignant neoplasm of breast: Secondary | ICD-10-CM

## 2023-03-03 ENCOUNTER — Other Ambulatory Visit: Payer: Self-pay | Admitting: Internal Medicine

## 2023-03-03 DIAGNOSIS — I10 Essential (primary) hypertension: Secondary | ICD-10-CM

## 2023-03-07 ENCOUNTER — Other Ambulatory Visit: Payer: Self-pay | Admitting: Internal Medicine

## 2023-03-07 DIAGNOSIS — E1140 Type 2 diabetes mellitus with diabetic neuropathy, unspecified: Secondary | ICD-10-CM

## 2023-03-07 DIAGNOSIS — E785 Hyperlipidemia, unspecified: Secondary | ICD-10-CM

## 2023-03-07 DIAGNOSIS — I10 Essential (primary) hypertension: Secondary | ICD-10-CM

## 2023-03-07 NOTE — Telephone Encounter (Signed)
Medication Refill - Medication: glucose blood (ACCU-CHEK AVIVA PLUS) test strip / Accu-Chek FastClix Lancets MISC  hydrOXYzine (ATARAX) 25 MG tablet  Has the patient contacted their pharmacy? yes (Agent: If no, request that the patient contact the pharmacy for the refill. If patient does not wish to contact the pharmacy document the reason why and proceed with request.) (Agent: If yes, when and what did the pharmacy advise?)contact pcp  Preferred Pharmacy (with phone number or street name): [id number] - [place], [place] - [address] AT [place] OF [address]  Phone: [phone] Fax: [phone]   Has the patient been seen for an appointment in the last year OR does the patient have an upcoming appointment? yes  Agent: Please be advised that RX refills may take up to 3 business days. We ask that you follow-up with your pharmacy.

## 2023-03-08 MED ORDER — HYDROXYZINE HCL 25 MG PO TABS: 25.0000 mg | ORAL_TABLET | Freq: Three times a day (TID) | ORAL | 0 refills | Status: DC | PRN

## 2023-03-08 MED ORDER — ACCU-CHEK AVIVA PLUS VI STRP: ORAL_STRIP | 0 refills | Status: DC

## 2023-03-08 MED ORDER — ACCU-CHEK FASTCLIX LANCETS MISC: 0 refills | Status: DC

## 2023-03-08 NOTE — Telephone Encounter (Signed)
Requested Prescriptions  Pending Prescriptions Disp Refills   Accu-Chek FastClix Lancets MISC 100 each 0    Sig: USE AS DIRECTED     Endocrinology: Diabetes - Testing Supplies Passed - 03/07/2023  4:30 PM      Passed - Valid encounter within last 12 months    Recent Outpatient Visits           1 month ago Type 2 diabetes mellitus with diabetic neuropathy, without long-term current use of insulin (HCC)   [place] [facility] & [facility] Jonah Blue B, MD   7 months ago Type 2 diabetes mellitus with diabetic neuropathy, without long-term current use of insulin (HCC)   [place] [facility] & [facility] Jonah Blue B, MD   11 months ago Type 2 diabetes mellitus with diabetic neuropathy, without long-term current use of insulin (HCC)   [place] [facility] Jonah Blue B, MD   1 year ago Type 2 diabetes mellitus with diabetic neuropathy, without long-term current use of insulin (HCC)   [place] [facility] Jonah Blue B, MD   1 year ago Type 2 diabetes mellitus with diabetic neuropathy, without long-term current use of insulin (HCC)   [place] [facility] Marcine Matar, MD       Future Appointments             In 3 months Laural Benes, Binnie Rail, MD [facility]   In 5 months Shari Prows, Kathlynn Grate, MD [facility]             glucose blood (ACCU-CHEK AVIVA PLUS) test strip 100 each 0    Sig: Check BS 1-2 times a day     Endocrinology: Diabetes - Testing Supplies Passed - 03/07/2023  4:30 PM      Passed - Valid encounter within last 12 months    Recent Outpatient Visits           1 month ago Type 2 diabetes mellitus with diabetic neuropathy, without long-term current use of insulin (HCC)   [place] [facility] Jonah Blue B, MD   7 months ago Type 2  diabetes mellitus with diabetic neuropathy, without long-term current use of insulin (HCC)   [place] [facility] Jonah Blue B, MD   11 months ago Type 2 diabetes mellitus with diabetic neuropathy, without long-term current use of insulin (HCC)   [place] [facility] Jonah Blue B, MD   1 year ago Type 2 diabetes mellitus with diabetic neuropathy, without long-term current use of insulin (HCC)   [place] [facility] Jonah Blue B, MD   1 year ago Type 2 diabetes mellitus with diabetic neuropathy, without long-term current use of insulin [facility])   [place] [facility] Marcine Matar, MD       Future Appointments             In 3 months Laural Benes Binnie Rail, MD [place] [facility]   In 5 months Meriam Sprague, MD [facility]             hydrOXYzine (ATARAX) 25 MG tablet 270 tablet 0    Sig: Take 1 tablet (25 mg total) by mouth 3 (three) times daily as needed.  Ear, Nose, and Throat:  Antihistamines 2 Passed - 03/07/2023  4:30 PM      Passed - Cr in normal range and within 360 days    Creat  Date Value Ref Range Status  08/18/2016 0.64 0.50 - 1.05 mg/dL Final    Comment:      For patients > or = 63 years of age: The upper reference limit for Creatinine is approximately 13% higher for people identified as African-American.      Creatinine, Ser  Date Value Ref Range Status  12/22/2022 0.91 0.44 - 1.00 mg/dL Final   Creatinine, POC  Date Value Ref Range Status  04/26/2017 50 mg/dL Final   Creatinine, Urine  Date Value Ref Range Status  12/10/2014 38.8 mg/dL Final    Comment:    No reference range established.         Passed - Valid encounter within last 12 months    Recent Outpatient Visits           1 month ago Type 2 diabetes mellitus with diabetic neuropathy, without  long-term current use of insulin (HCC)   [place] [facility] Jonah Blue B, MD   7 months ago Type 2 diabetes mellitus with diabetic neuropathy, without long-term current use of insulin [facility])   [place] [facility] Jonah Blue B, MD   11 months ago Type 2 diabetes mellitus with diabetic neuropathy, without long-term current use of insulin [facility])   [place] [facility] Jonah Blue B, MD   1 year ago Type 2 diabetes mellitus with diabetic neuropathy, without long-term current use of insulin [facility])   [place] [facility] Jonah Blue B, MD   1 year ago Type 2 diabetes mellitus with diabetic neuropathy, without long-term current use of insulin [facility])    [facility] Marcine Matar, MD       Future Appointments             In 3 months Laural Benes, Binnie Rail, MD [facility]   In 5 months Shari Prows, Kathlynn Grate, MD [facility]

## 2023-03-09 ENCOUNTER — Other Ambulatory Visit: Payer: Self-pay | Admitting: Internal Medicine

## 2023-03-09 DIAGNOSIS — E118 Type 2 diabetes mellitus with unspecified complications: Secondary | ICD-10-CM

## 2023-03-16 ENCOUNTER — Other Ambulatory Visit: Payer: Self-pay | Admitting: Internal Medicine

## 2023-03-16 DIAGNOSIS — K219 Gastro-esophageal reflux disease without esophagitis: Secondary | ICD-10-CM

## 2023-03-16 NOTE — Telephone Encounter (Signed)
Requested medications are due for refill today.  unsure  Requested medications are on the active medications list.  yes  Last refill. 07/19/2022 #[phone] [id number]  Future visit scheduled.   yes  Notes to clinic.  Unclear if pt is to continue to take medication.    Requested Prescriptions  Pending Prescriptions Disp Refills   ferrous sulfate 325 (65 FE) MG tablet [Pharmacy Med Name: FERROUS SULFATE 325 MG TABLET] 36 tablet 0    Sig: TAKE 1 TABLET BY MOUTH THREE TIMES WEEKLY AFTER A MEAL     Endocrinology:  Minerals - Iron Supplementation Failed - 03/16/2023  1:48 AM      Failed - HGB in normal range and within 360 days    Hemoglobin  Date Value Ref Range Status  12/22/2022 11.8 (L) 12.0 - 15.0 g/dL Final  16/08/9603 54.0 11.1 - 15.9 g/dL Final         Failed - HCT in normal range and within 360 days    HCT  Date Value Ref Range Status  12/22/2022 34.9 (L) 36.0 - 46.0 % Final   Hematocrit  Date Value Ref Range Status  05/17/2022 38.5 34.0 - 46.6 % Final         Failed - RBC in normal range and within 360 days    RBC  Date Value Ref Range Status  12/22/2022 3.71 (L) 3.87 - 5.11 MIL/uL Final         Failed - Ferritin in normal range and within 360 days    Ferritin  Date Value Ref Range Status  02/13/2023 386 (H) 15 - 150 ng/mL Final         Passed - Fe (serum) in normal range and within 360 days    Iron  Date Value Ref Range Status  02/13/2023 70 27 - 139 ug/dL Final   Iron Saturation  Date Value Ref Range Status  02/13/2023 18 15 - 55 % Final         Passed - Valid encounter within last 12 months    Recent Outpatient Visits           1 month ago Type 2 diabetes mellitus with diabetic neuropathy, without long-term current use of insulin (HCC)   [place] [facility] Jonah Blue B, MD   7 months ago Type 2 diabetes mellitus with diabetic neuropathy, without long-term current use of insulin (HCC)   [place] [facility] Jonah Blue B, MD   11 months ago Type 2 diabetes mellitus with diabetic neuropathy, without long-term current use of insulin (HCC)   [place] [facility] Jonah Blue B, MD   1 year ago Type 2 diabetes mellitus with diabetic neuropathy, without long-term current use of insulin [facility])   [place] [facility] Jonah Blue B, MD   1 year ago Type 2 diabetes mellitus with diabetic neuropathy, without long-term current use of insulin [facility])   [place] [facility] & [facility] Marcine Matar, MD       Future Appointments             In 2 months Laural Benes, Binnie Rail, MD [facility]   In 5 months Shari Prows, Kathlynn Grate, MD [facility]

## 2023-04-10 ENCOUNTER — Other Ambulatory Visit: Payer: Self-pay | Admitting: Internal Medicine

## 2023-04-10 DIAGNOSIS — I10 Essential (primary) hypertension: Secondary | ICD-10-CM

## 2023-04-13 ENCOUNTER — Ambulatory Visit
Admission: EM | Admit: 2023-04-13 | Discharge: 2023-04-13 | Disposition: A | Discharge status: Home / Self Care | Payer: Medicaid Other | Attending: Nurse Practitioner | Admitting: Nurse Practitioner

## 2023-04-13 DIAGNOSIS — R519 Headache, unspecified: Secondary | ICD-10-CM | POA: Diagnosis not present

## 2023-04-13 MED ORDER — METHYLPREDNISOLONE ACETATE 80 MG/ML IJ SUSP
40.0000 mg | Freq: Once | INTRAMUSCULAR | Status: AC
  Administered 2023-04-13: 40 mg via INTRAMUSCULAR

## 2023-04-13 MED ORDER — METHYLPREDNISOLONE ACETATE 40 MG/ML IJ SUSP: 40.0000 mg | Freq: Once | INTRAMUSCULAR | Status: DC

## 2023-04-13 NOTE — ED Provider Notes (Signed)
[facility]    CSN: [id number] Arrival date & time: 04/13/23  4098      History   Chief Complaint Chief Complaint  Patient presents with   Headache    HPI Jane Arellano is a 62 y.o. female presents for evaluation of a headache.  Patient reports over the past 2 to 3 days she has had an intermittent frontal headache that she rates as an 8 out of 10.  She is unable to describe the characteristic of the pain and just says it "hurts".  Denies any dizziness, visual changes, photophobia, nausea/vomiting, neck pain, syncope.  Reports a history of migraines as a young adult but has not had any in several years.  Denies thunderclap headache.  She does have a history of A-fib currently on Pradaxa and is unable to take any NSAIDs.  She did take Tylenol which helped some but does not completely alleviate her headache.  She reports she is having difficulty sleeping secondary to this.  No first-degree relative with history of SAH.  No other concerns at this time.   Headache   Past Medical History:  Diagnosis Date   Atypical chest pain    a. 11/2005 Negative Myoview   Diastolic dysfunction    DM2 (diabetes mellitus, type 2) (HCC)    Dysrhythmia    AFIB HX CV    GERD (gastroesophageal reflux disease)    Heart murmur    History of substance abuse (HCC)    HTN (hypertension)    Iron deficiency anemia    Paroxysmal atrial fibrillation (HCC) 05/01/2013   On Xarelto   Subaortic membrane    a. 01/2010 Echo: EF 55-60%, No rwma, subaortic membrane with elevated LVOT mean gradient of 21 mmHg, Triv AI, mod dil LA, mildly increased PASP. b. 05/02/2013 Echo:  LVEF 60-65%, grade 1 diastolic dysfunction, mild LVH, subaortic stenosis w/ turbulation in LVOT c/w subaortic membrane (mean gradient 42 mmHg/peak gradient 81 mmHg), mild biatrial enlargement    Patient Active Problem List   Diagnosis Date Noted   Hypercoagulable state due to paroxysmal atrial fibrillation (HCC) 11/30/2021   Dyslipidemia  10/25/2017   Pap smear for cervical cancer screening 08/18/2016   Diabetic polyneuropathy associated with type 2 diabetes mellitus (HCC) 01/14/2016   Diabetic eye exam (HCC) 01/14/2016   Screening for colorectal cancer 01/14/2016   Neck pain 06/29/2015   Type 2 diabetes mellitus with complication, without long-term current use of insulin (HCC) 03/16/2015   Essential hypertension 03/16/2015   BV (bacterial vaginosis) 12/11/2014   Screening for colon cancer 12/10/2014   Keloid scar 12/10/2014   Screening for STD (sexually transmitted disease) 12/10/2014   Onychomycosis of toenail 12/10/2014   Cerumen impaction 12/10/2014   Diabetes mellitus type 2 with neurological manifestations (HCC) 08/14/2014   Blurry vision, bilateral 08/14/2014   PAF (paroxysmal atrial fibrillation) (HCC) 05/08/2013   ETOH abuse 05/02/2013   Marijuana abuse 05/02/2013   Diastolic dysfunction 05/02/2013   Depression with anxiety 05/01/2013   Subaortic membrane    HTN (hypertension)    GERD 04/30/2007    Past Surgical History:  Procedure Laterality Date   CARDIOVERSION N/A 11/04/2013   Procedure: CARDIOVERSION;  Surgeon: Peter M Swaziland, MD;  [facility] ENDOSCOPY;  Service: Cardiovascular;  Laterality: N/A;   CESAREAN SECTION      OB History   No obstetric history on file.      Home Medications    Prior to Admission medications   Medication Sig Start Date End Date Taking?  Authorizing Provider  Accu-Chek FastClix Lancets MISC USE AS DIRECTED 03/08/23   Marcine Matar, MD  Blood Glucose Calibration (ACCU-CHEK AVIVA) SOLN USE AS INSTRUCTED 01/11/16   Quentin Angst, MD  Blood Glucose Monitoring Suppl (ACCU-CHEK AVIVA PLUS) w/Device KIT USE AS DIRECTED TWICE DAILY AT 8 AM AND 10 PM 01/04/16   Jegede, Phylliss Blakes, MD  Calcium Carb-Cholecalciferol (CALCIUM-VITAMIN D3) 500-400 MG-UNIT TABS Take 1 tablet by mouth daily. 04/11/18   Anders Simmonds, PA-C  carvedilol (COREG) 3.125 MG tablet TAKE 1  TABLET BY MOUTH TWICE A DAY 02/23/23   Marcine Matar, MD  cetirizine (ZYRTEC) 10 MG tablet TAKE 1 TABLET (10 MG TOTAL) BY MOUTH DAILY AS NEEDED FOR ALLERGIES. AS NEEDED FOR NASAL CONGESTION 01/25/23   Marcine Matar, MD  dabigatran (PRADAXA) 150 MG CAPS capsule TAKE 1 CAPSULE BY MOUTH TWICE A DAY 03/03/23   Marcine Matar, MD  famotidine (PEPCID) 40 MG tablet TAKE 1 TABLET BY MOUTH EVERYDAY AT BEDTIME 01/05/23   Marcine Matar, MD  fenofibrate (TRICOR) 145 MG tablet TAKE 1 TABLET BY MOUTH EVERY DAY 01/25/23   Marcine Matar, MD  ferrous sulfate 325 (65 FE) MG tablet TAKE 1 TABLET BY MOUTH THREE TIMES WEEKLY AFTER A MEAL 03/16/23   Marcine Matar, MD  folic acid (FOLVITE) 800 MCG tablet Take 800 mcg by mouth daily.    [provider]  furosemide (LASIX) 20 MG tablet TAKE 1 TABLET BY MOUTH EVERY DAY 04/11/23   Marcine Matar, MD  gabapentin (NEURONTIN) 600 MG tablet TAKE 1 TABLET BY MOUTH THREE TIMES A DAY 08/13/22   Marcine Matar, MD  glucose blood (ACCU-CHEK AVIVA PLUS) test strip Check BS 1-2 times a day 03/08/23   Marcine Matar, MD  hydrOXYzine (ATARAX) 25 MG tablet Take 1 tablet (25 mg total) by mouth 3 (three) times daily as needed. 03/08/23   Marcine Matar, MD  losartan (COZAAR) 25 MG tablet TAKE 1 TABLET (25 MG TOTAL) BY MOUTH DAILY. 02/22/23   Marcine Matar, MD  metFORMIN (GLUCOPHAGE) 1000 MG tablet TAKE 1 TABLET BY MOUTH TWICE A DAY WITH FOOD 03/09/23   Marcine Matar, MD  Multiple Vitamin (MULTIVITAMIN WITH MINERALS) TABS tablet Take 1 tablet by mouth daily.    [provider]  Omega-3 Fatty Acids (FISH OIL) 1000 MG CPDR Take 3 g by mouth daily. Patient taking differently: Take 2 capsules by mouth daily. 04/11/18   Anders Simmonds, PA-C  omeprazole (PRILOSEC) 40 MG capsule Take 1 capsule (40 mg total) by mouth daily. 07/27/22   Marcine Matar, MD  polyethylene glycol powder (GAVILAX) 17 GM/SCOOP powder MIX 1 CAPFUL WITH LIQUID AND  DRINK ONCE DAILY 10/30/19   Fulp, Cammie, MD  sucralfate (CARAFATE) 1 g tablet Take 1 tablet (1 g total) by mouth 4 (four) times daily -  with meals and at bedtime. You can swallow the pill or dissolve in water 12/22/22   Gwyneth Sprout, MD  potassium chloride (K-DUR,KLOR-CON) 10 MEQ tablet Take 1 tablet (10 mEq total) by mouth daily. 11/06/13 04/24/19  Quentin Angst, MD    Family History Family History  Problem Relation Age of Onset   Diabetes Father        deceased   Cancer Mother 106       colon   Stroke Mother    Hypertension Sister        alive and well   Hypertension Sister  alive and well   Heart attack Brother        deceased @ 76    Social History Social History   Tobacco Use   Smoking status: Never   Smokeless tobacco: Current    Types: Snuff   Tobacco comments:    Currently snuff 02/13/23  Vaping Use   Vaping Use: Never used  Substance Use Topics   Alcohol use: Not Currently    Alcohol/week: 2.0 - 3.0 standard drinks of alcohol    Types: 2 - 3 Glasses of wine per week   Drug use: No    Types: Marijuana    Comment: Smokes marijuana weekly.  Has not used cocaine in 9 years.     Allergies   Xarelto [rivaroxaban], Amoxicillin, Ibuprofen, and Ketorolac tromethamine   Review of Systems Review of Systems  Neurological:  Positive for headaches.     Physical Exam Triage Vital Signs ED Triage Vitals  Enc Vitals Group     BP 04/13/23 1018 (!) 147/65     Pulse Rate 04/13/23 1018 (!) 52     Resp 04/13/23 1016 18     Temp 04/13/23 1016 97.8 F (36.6 C)     Temp Source 04/13/23 1016 Oral     SpO2 04/13/23 1016 98 %     Weight --      Height --      Head Circumference --      Peak Flow --      Pain Score 04/13/23 1016 9     Pain Loc --      Pain Edu? --      Excl. in GC? --    No data found.  Updated Vital Signs BP (!) 147/65   Pulse (!) 52   Temp 97.8 F (36.6 C) (Oral)   Resp 18   SpO2 98%   Visual Acuity Right Eye Distance:    Left Eye Distance:   Bilateral Distance:    Right Eye Near:   Left Eye Near:    Bilateral Near:     Physical Exam Vitals and nursing note reviewed.  Constitutional:      General: She is not in acute distress.    Appearance: Normal appearance. She is not ill-appearing, toxic-appearing or diaphoretic.  HENT:     Head: Normocephalic and atraumatic.  Eyes:     Extraocular Movements: Extraocular movements intact.     Conjunctiva/sclera: Conjunctivae normal.     Pupils: Pupils are equal, round, and reactive to light.  Cardiovascular:     Rate and Rhythm: Regular rhythm. Bradycardia present.     Heart sounds: Normal heart sounds.     Comments: Bradycardia at 52 Pulmonary:     Effort: Pulmonary effort is normal.     Breath sounds: Normal breath sounds.  Skin:    General: Skin is warm and dry.  Neurological:     General: No focal deficit present.     Mental Status: She is alert and oriented to person, place, and time.     GCS: GCS eye subscore is 4. GCS verbal subscore is 5. GCS motor subscore is 6.     Cranial Nerves: No facial asymmetry.     Motor: No weakness or pronator drift.     Coordination: Romberg sign negative. Finger-Nose-Finger Test normal.     Gait: Gait is intact.  Psychiatric:        Mood and Affect: Mood normal.        Behavior: Behavior normal.  UC Treatments / Results  Labs (all labs ordered are listed, but only abnormal results are displayed) Labs Reviewed - No data to display  Basic metabolic panel Order: [id number] Status: Final result     Visible to patient: Yes (seen)     Next appt: 06/06/2023 at 10:30 AM in Internal Medicine Jonah Blue, MD)   0 Result Notes     1 HM Topic          Component Ref Range & Units 3 mo ago (12/22/22) 8 mo ago (08/10/22) 11 mo ago (05/13/22) 1 yr ago (07/30/21) 2 yr ago (11/09/20) 3 yr ago (04/13/20) 3 yr ago (10/30/19)  Sodium 135 - 145 mmol/L 136 137 141 R 142 R 143 R 141 R 146 High  R  Potassium 3.5  - 5.1 mmol/L 3.9 3.7 4.2 R 4.1 R 4.5 R 4.0 R 4.1 R  Chloride 98 - 111 mmol/L 100 103 102 R 101 R 104 R 103 R 105 R  CO2 22 - 32 mmol/L 26 22 23  R 23 R 23 R 22 R 24 R  Glucose, Bld 70 - 99 mg/dL 409 High  95 CM 811 High  175 High  R 92 R 151 High  R 124 High  R  Comment: Glucose reference range applies only to samples taken after fasting for at least 8 hours.  BUN 8 - 23 mg/dL 12 9 10  R 8 R 8 R 10 R 9 R  Creatinine, Ser 0.44 - 1.00 mg/dL 9.14 7.82 9.56 R 2.13 R 0.79 R 0.84 R 0.67 R  Calcium 8.9 - 10.3 mg/dL 08.6 High  57.8 46.9 High  R 10.3 R 10.6 High  R 10.7 High  R 10.4 High  R  GFR, Estimated >60 mL/min >60 >60 CM       Comment: (NOTE) Calculated using the CKD-EPI Creatinine Equation (2021)  Anion gap 5 - 15 10 12  CM       Comment: Performed at [facility], [address], [place], [place] [address]  Resulting Agency [facility]              View All Conversations on this Encounter      CM=Additional comments  R=Reference range differs from displayed range      Result Care Coordination       EKG   Radiology No results found.  Procedures Procedures (including critical care time)  Medications Ordered in UC Medications  methylPREDNISolone acetate (DEPO-MEDROL) injection 40 mg (40 mg Intramuscular Given 04/13/23 1108)    Initial Impression / Assessment and Plan / UC Course  I have reviewed the triage vital signs and the nursing notes.  Pertinent labs & imaging results that were available during my care of the patient were reviewed by me and considered in my medical decision making (see chart for details).     Reviewed exam and symptoms with patient.  Patient denies this is the worst headache of her life.  Treatment options limited given her inability to take NSAIDs.  Does have a history of substance abuse.  Patient was given 1 dose of IM Depo-Medrol in clinic.  She was monitored for 20  minutes after injection and tolerated well with no reaction noted.  She does report improvement of her headache and now rates it as a 6 out of 10. Advised patient to continue over-the-counter Tylenol as needed Keep a headache diary and take to her PCP Follow-up  with PCP if symptoms do not improve ER precautions reviewed and patient verbalized understanding Final Clinical Impressions(s) / UC Diagnoses   Final diagnoses:  Acute nonintractable headache, unspecified headache type     Discharge Instructions      You may continue Tylenol over-the-counter as needed for your headache Please follow-up with your PCP if your symptoms do not continue to improve Please go to the ER if you develop any worsening symptoms     ED Prescriptions   None    PDMP not reviewed this encounter.   Radford Pax, NP 04/13/23 1126

## 2023-04-13 NOTE — ED Triage Notes (Addendum)
Pt presents with c/o headaches x 3 days. Reports lack of sleep and tylenol has not helped. Pt reports no change in life style and eating habits, has been hydrating.

## 2023-04-13 NOTE — Discharge Instructions (Signed)
You may continue Tylenol over-the-counter as needed for your headache Please follow-up with your PCP if your symptoms do not continue to improve Please go to the ER if you develop any worsening symptoms

## 2023-04-18 ENCOUNTER — Other Ambulatory Visit: Payer: Self-pay | Admitting: Internal Medicine

## 2023-04-18 DIAGNOSIS — J302 Other seasonal allergic rhinitis: Secondary | ICD-10-CM

## 2023-04-24 ENCOUNTER — Other Ambulatory Visit: Payer: Self-pay | Admitting: Internal Medicine

## 2023-04-24 DIAGNOSIS — E785 Hyperlipidemia, unspecified: Secondary | ICD-10-CM

## 2023-04-25 NOTE — Telephone Encounter (Signed)
Requested Prescriptions  Pending Prescriptions Disp Refills   fenofibrate (TRICOR) 145 MG tablet [Pharmacy Med Name: FENOFIBRATE 145 MG TABLET] 90 tablet 0    Sig: TAKE 1 TABLET BY MOUTH EVERY DAY     Cardiovascular:  Antilipid - Fibric Acid Derivatives Failed - 04/24/2023 10:27 AM      Failed - HGB in normal range and within 360 days    Hemoglobin  Date Value Ref Range Status  12/22/2022 11.8 (L) 12.0 - 15.0 g/dL Final  16/08/9603 54.0 11.1 - 15.9 g/dL Final         Failed - HCT in normal range and within 360 days    HCT  Date Value Ref Range Status  12/22/2022 34.9 (L) 36.0 - 46.0 % Final   Hematocrit  Date Value Ref Range Status  05/17/2022 38.5 34.0 - 46.6 % Final         Failed - Lipid Panel in normal range within the last 12 months    Cholesterol, Total  Date Value Ref Range Status  08/01/2022 140 100 - 199 mg/dL Final   LDL Chol Calc (NIH)  Date Value Ref Range Status  08/01/2022 59 0 - 99 mg/dL Final   HDL  Date Value Ref Range Status  08/01/2022 63 >39 mg/dL Final   Triglycerides  Date Value Ref Range Status  08/01/2022 95 0 - 149 mg/dL Final         Passed - ALT in normal range and within 360 days    ALT  Date Value Ref Range Status  05/13/2022 14 0 - 32 IU/L Final         Passed - AST in normal range and within 360 days    AST  Date Value Ref Range Status  05/13/2022 15 0 - 40 IU/L Final         Passed - Cr in normal range and within 360 days    Creat  Date Value Ref Range Status  08/18/2016 0.64 0.50 - 1.05 mg/dL Final    Comment:      For patients > or = 63 years of age: The upper reference limit for Creatinine is approximately 13% higher for people identified as African-American.      Creatinine, Ser  Date Value Ref Range Status  12/22/2022 0.91 0.44 - 1.00 mg/dL Final   Creatinine, POC  Date Value Ref Range Status  04/26/2017 50 mg/dL Final   Creatinine, Urine  Date Value Ref Range Status  12/10/2014 38.8 mg/dL Final     Comment:    No reference range established.         Passed - PLT in normal range and within 360 days    Platelets  Date Value Ref Range Status  12/22/2022 197 150 - 400 K/uL Final  05/17/2022 209 150 - 450 x10E3/uL Final         Passed - WBC in normal range and within 360 days    WBC  Date Value Ref Range Status  12/22/2022 4.3 4.0 - 10.5 K/uL Final         Passed - eGFR is 30 or above and within 360 days    GFR, Est African American  Date Value Ref Range Status  08/18/2016 >89 >=60 mL/min Final   GFR calc Af Amer  Date Value Ref Range Status  11/09/2020 94 >59 mL/min/1.73 Final    Comment:    **In accordance with recommendations from the NKF-ASN Task force,**   [facility] is in the  process of updating its eGFR calculation to the   2021 CKD-EPI creatinine equation that estimates kidney function   without a race variable.    GFR, Est Non African American  Date Value Ref Range Status  08/18/2016 >89 >=60 mL/min Final   GFR, Estimated  Date Value Ref Range Status  12/22/2022 >60 >60 mL/min Final    Comment:    (NOTE) Calculated using the CKD-EPI Creatinine Equation (2021)    GFR  Date Value Ref Range Status  10/30/2013 120.29 >60.00 mL/min Final   eGFR  Date Value Ref Range Status  05/13/2022 84 >59 mL/min/1.73 Final         Passed - Valid encounter within last 12 months    Recent Outpatient Visits           2 months ago Type 2 diabetes mellitus with diabetic neuropathy, without long-term current use of insulin (HCC)   [place] [facility] Jonah Blue B, MD   8 months ago Type 2 diabetes mellitus with diabetic neuropathy, without long-term current use of insulin (HCC)   [place] [facility] Jonah Blue B, MD   1 year ago Type 2 diabetes mellitus with diabetic neuropathy, without long-term current use of insulin (HCC)   [place] [facility] [facility] & [facility] Jonah Blue B, MD   1 year  ago Type 2 diabetes mellitus with diabetic neuropathy, without long-term current use of insulin [facility])   [place] [facility] Jonah Blue B, MD   1 year ago Type 2 diabetes mellitus with diabetic neuropathy, without long-term current use of insulin [facility])   [place] [facility] & [facility] Marcine Matar, MD       Future Appointments             In 1 month Laural Benes, Binnie Rail, MD [facility]   In 3 months Shari Prows, Kathlynn Grate, MD [facility], [facility]

## 2023-05-16 ENCOUNTER — Telehealth: Payer: Self-pay | Admitting: Internal Medicine

## 2023-05-16 DIAGNOSIS — E1140 Type 2 diabetes mellitus with diabetic neuropathy, unspecified: Secondary | ICD-10-CM

## 2023-05-16 MED ORDER — ACCU-CHEK SOFTCLIX LANCETS MISC
2 refills | Status: AC
Start: 2023-05-16 — End: ?

## 2023-05-16 MED ORDER — ACCU-CHEK GUIDE W/DEVICE KIT
PACK | 0 refills | Status: AC
Start: 2023-05-16 — End: ?

## 2023-05-16 MED ORDER — ACCU-CHEK GUIDE VI STRP
ORAL_STRIP | 2 refills | Status: AC
Start: 2023-05-16 — End: ?

## 2023-05-16 NOTE — Telephone Encounter (Signed)
Patient request new prescription for a new meter that will fit her AccuChek test strips.

## 2023-05-16 NOTE — Telephone Encounter (Signed)
Medication Refill - Medication: Needs a new meter that will fit her AccuChek test strips. Says [facility] request last week  Has the patient contacted their pharmacy? Yes.   (Agent: If no, request that the patient contact the pharmacy for the refill. If patient does not wish to contact the pharmacy document the reason why and proceed with request.) (Agent: If yes, when and what did the pharmacy advise?)  Preferred Pharmacy (with phone number or street name):  [phone] [place], [place] - [address] [facility] [address]  [address] [place] [address]  Phone: [phone] Fax: [phone]   Has the patient been seen for an appointment in the last year OR does the patient have an upcoming appointment? Yes.    Agent: Please be advised that RX refills may take up to 3 business days. We ask that you follow-up with your pharmacy.

## 2023-05-16 NOTE — Telephone Encounter (Signed)
Rxn sent for appropriate Accu Chek meter.

## 2023-05-22 ENCOUNTER — Other Ambulatory Visit: Payer: Self-pay | Admitting: Internal Medicine

## 2023-05-22 DIAGNOSIS — E1159 Type 2 diabetes mellitus with other circulatory complications: Secondary | ICD-10-CM

## 2023-05-30 ENCOUNTER — Other Ambulatory Visit: Payer: Self-pay | Admitting: Internal Medicine

## 2023-05-30 DIAGNOSIS — I10 Essential (primary) hypertension: Secondary | ICD-10-CM

## 2023-05-30 NOTE — Telephone Encounter (Signed)
Requested medication (s) are due for refill today: yes  Requested medication (s) are on the active medication list:yes  Last refill:  03/03/23  Future visit scheduled: yes  Notes to clinic:  lab work out of normal range   Requested Prescriptions  Pending Prescriptions Disp Refills   PRADAXA 150 MG CAPS capsule [Pharmacy Med Name: PRADAXA 150 MG CAPSULE] 180 capsule 0    Sig: TAKE 1 CAPSULE BY MOUTH TWICE A DAY     Hematology:  Anticoagulants 2 Failed - 05/30/2023  8:30 AM      Failed - HGB in normal range and within 360 days    Hemoglobin  Date Value Ref Range Status  12/22/2022 11.8 (L) 12.0 - 15.0 g/dL Final  16/08/9603 54.0 11.1 - 15.9 g/dL Final         Failed - HCT in normal range and within 360 days    HCT  Date Value Ref Range Status  12/22/2022 34.9 (L) 36.0 - 46.0 % Final   Hematocrit  Date Value Ref Range Status  05/17/2022 38.5 34.0 - 46.6 % Final         Failed - AST in normal range and within 360 days    AST  Date Value Ref Range Status  05/13/2022 15 0 - 40 IU/L Final         Failed - ALT in normal range and within 360 days    ALT  Date Value Ref Range Status  05/13/2022 14 0 - 32 IU/L Final         Passed - PLT in normal range and within 360 days    Platelets  Date Value Ref Range Status  12/22/2022 197 150 - 400 K/uL Final  05/17/2022 209 150 - 450 x10E3/uL Final         Passed - Cr in normal range and within 360 days    Creat  Date Value Ref Range Status  08/18/2016 0.64 0.50 - 1.05 mg/dL Final    Comment:      For patients > or = 63 years of age: The upper reference limit for Creatinine is approximately 13% higher for people identified as African-American.      Creatinine, Ser  Date Value Ref Range Status  12/22/2022 0.91 0.44 - 1.00 mg/dL Final   Creatinine, POC  Date Value Ref Range Status  04/26/2017 50 mg/dL Final   Creatinine, Urine  Date Value Ref Range Status  12/10/2014 38.8 mg/dL Final    Comment:    No reference  range established.         Passed - eGFR is 10 or above and within 360 days    GFR, Est African American  Date Value Ref Range Status  08/18/2016 >89 >=60 mL/min Final   GFR calc Af Amer  Date Value Ref Range Status  11/09/2020 94 >59 mL/min/1.73 Final    Comment:    **In accordance with recommendations from the NKF-ASN Task force,**   [facility] is in the process of updating its eGFR calculation to the   2021 CKD-EPI creatinine equation that estimates kidney function   without a race variable.    GFR, Est Non African American  Date Value Ref Range Status  08/18/2016 >89 >=60 mL/min Final   GFR, Estimated  Date Value Ref Range Status  12/22/2022 >60 >60 mL/min Final    Comment:    (NOTE) Calculated using the CKD-EPI Creatinine Equation (2021)    GFR  Date Value Ref Range Status  10/30/2013 120.29 >60.00 mL/min Final   eGFR  Date Value Ref Range Status  05/13/2022 84 >59 mL/min/1.73 Final         Passed - Patient is not pregnant      Passed - Valid encounter within last 12 months    Recent Outpatient Visits           3 months ago Type 2 diabetes mellitus with diabetic neuropathy, without long-term current use of insulin (HCC)   [place] [facility] Jonah Blue B, MD   10 months ago Type 2 diabetes mellitus with diabetic neuropathy, without long-term current use of insulin (HCC)   [place] [facility] Jonah Blue B, MD   1 year ago Type 2 diabetes mellitus with diabetic neuropathy, without long-term current use of insulin [facility])   [place] [facility] Jonah Blue B, MD   1 year ago Type 2 diabetes mellitus with diabetic neuropathy, without long-term current use of insulin [facility])   [place] [facility] Jonah Blue B, MD   1 year ago Type 2 diabetes mellitus with diabetic neuropathy, without long-term current use of insulin [facility])   [place]  [facility] & [facility] Marcine Matar, MD       Future Appointments             In 1 week Marcine Matar, MD [facility]   In 2 months Shari Prows, Kathlynn Grate, MD [facility] at [facility], [facility]

## 2023-06-06 ENCOUNTER — Ambulatory Visit: Payer: Medicaid Other | Admitting: Internal Medicine

## 2023-06-15 ENCOUNTER — Other Ambulatory Visit: Payer: Self-pay | Admitting: Internal Medicine

## 2023-06-15 ENCOUNTER — Encounter: Payer: Self-pay | Admitting: Cardiology

## 2023-06-15 DIAGNOSIS — E1159 Type 2 diabetes mellitus with other circulatory complications: Secondary | ICD-10-CM

## 2023-06-15 NOTE — Telephone Encounter (Signed)
Requested medication (s) are due for refill today: yes  Requested medication (s) are on the active medication list: yes  Last refill:  05/22/23  Future visit scheduled: yes  Notes to clinic:  Unable to refill per protocol, courtesy refill already given, routing for provider approval.      Requested Prescriptions  Pending Prescriptions Disp Refills   losartan (COZAAR) 25 MG tablet [Pharmacy Med Name: LOSARTAN POTASSIUM 25 MG TAB] 90 tablet 1    Sig: TAKE 1 TABLET (25 MG TOTAL) BY MOUTH DAILY.     Cardiovascular:  Angiotensin Receptor Blockers Failed - 06/15/2023 11:32 AM      Failed - Last BP in normal range    BP Readings from Last 1 Encounters:  04/13/23 (!) 147/65         Passed - Cr in normal range and within 180 days    Creat  Date Value Ref Range Status  08/18/2016 0.64 0.50 - 1.05 mg/dL Final    Comment:      For patients > or = 63 years of age: The upper reference limit for Creatinine is approximately 13% higher for people identified as African-American.      Creatinine, Ser  Date Value Ref Range Status  12/22/2022 0.91 0.44 - 1.00 mg/dL Final   Creatinine, POC  Date Value Ref Range Status  04/26/2017 50 mg/dL Final   Creatinine, Urine  Date Value Ref Range Status  12/10/2014 38.8 mg/dL Final    Comment:    No reference range established.         Passed - K in normal range and within 180 days    Potassium  Date Value Ref Range Status  12/22/2022 3.9 3.5 - 5.1 mmol/L Final         Passed - Patient is not pregnant      Passed - Valid encounter within last 6 months    Recent Outpatient Visits           4 months ago Type 2 diabetes mellitus with diabetic neuropathy, without long-term current use of insulin (HCC)   [place] [facility] Jon[facility] Blue B, MD   10 months ago Type 2 diabetes mellitus with diabetic neuropathy, without long-term current use of insulin (HCC)   [place] [facility] Jonah Blue B, MD   1 year ago Type 2 diabetes mellitus with diabetic neuropathy, without long-term current use of insulin (HCC)   [place] [facility] Jonah Blue B, MD   1 year ago Type 2 diabetes mellitus with diabetic neuropathy, without long-term current use of insulin [facility])   [place] [facility] Jonah Blue B, MD   1 year ago Type 2 diabetes mellitus with diabetic neuropathy, without long-term current use of insulin [facility])   [place] [facility] Marcine Matar, MD       Future Appointments             In 2 months Shari Prows, Kathlynn Grate, MD [facility], [facility]   In 2 months Laural Benes, Binnie Rail, MD [facility]

## 2023-06-19 ENCOUNTER — Other Ambulatory Visit: Payer: Self-pay | Admitting: Internal Medicine

## 2023-06-19 DIAGNOSIS — I10 Essential (primary) hypertension: Secondary | ICD-10-CM

## 2023-06-20 NOTE — Telephone Encounter (Signed)
Requested Prescriptions  Pending Prescriptions Disp Refills   carvedilol (COREG) 3.125 MG tablet [Pharmacy Med Name: CARVEDILOL 3.125 MG TABLET] 180 tablet 0    Sig: TAKE 1 TABLET BY MOUTH TWICE A DAY     Cardiovascular: Beta Blockers 3 Failed - 06/19/2023 11:11 AM      Failed - AST in normal range and within 360 days    AST  Date Value Ref Range Status  05/13/2022 15 0 - 40 IU/L Final         Failed - ALT in normal range and within 360 days    ALT  Date Value Ref Range Status  05/13/2022 14 0 - 32 IU/L Final         Failed - Last BP in normal range    BP Readings from Last 1 Encounters:  04/13/23 (!) 147/65         Passed - Cr in normal range and within 360 days    Creat  Date Value Ref Range Status  08/18/2016 0.64 0.50 - 1.05 mg/dL Final    Comment:      For patients > or = 63 years of age: The upper reference limit for Creatinine is approximately 13% higher for people identified as African-American.      Creatinine, Ser  Date Value Ref Range Status  12/22/2022 0.91 0.44 - 1.00 mg/dL Final   Creatinine, POC  Date Value Ref Range Status  04/26/2017 50 mg/dL Final   Creatinine, Urine  Date Value Ref Range Status  12/10/2014 38.8 mg/dL Final    Comment:    No reference range established.         Passed - Last Heart Rate in normal range    Pulse Readings from Last 1 Encounters:  04/13/23 (!) 52         Passed - Valid encounter within last 6 months    Recent Outpatient Visits           4 months ago Type 2 diabetes mellitus with diabetic neuropathy, without long-term current use of insulin (HCC)   [place] [facility] Jonah Blue B, MD   10 months ago Type 2 diabetes mellitus with diabetic neuropathy, without long-term current use of insulin (HCC)   [facility] Jon[facility] Blue B, MD   1 year ago Type 2 diabetes mellitus with diabetic neuropathy, without long-term current use of insulin [facility])    [place] [facility] Jonah Blue B, MD   1 year ago Type 2 diabetes mellitus with diabetic neuropathy, without long-term current use of insulin [facility])   [place] [facility] Jonah Blue B, MD   1 year ago Type 2 diabetes mellitus with diabetic neuropathy, without long-term current use of insulin [facility])   [place] Marcine Matar, MD       Future Appointments             In 2 months Laural Benes, Binnie Rail, MD [facility]

## 2023-07-10 ENCOUNTER — Other Ambulatory Visit: Payer: Self-pay | Admitting: Internal Medicine

## 2023-07-10 DIAGNOSIS — I10 Essential (primary) hypertension: Secondary | ICD-10-CM

## 2023-07-10 NOTE — Telephone Encounter (Signed)
Medication Refill - Medication: furosemide (LASIX) 20 MG tablet   Has the patient contacted their pharmacy? Yes.    (Agent: If yes, when and what did the pharmacy advise?)  Preferred Pharmacy (with phone number or street name):  [phone] [place], [place] - [address] [facility] [address]  [address] [place] [address]  Phone: [phone] Fax: [phone]  Hours: Not open 24 hours   Has the patient been seen for an appointment in the last year OR does the patient have an upcoming appointment? Yes.    Agent: Please be advised that RX refills may take up to 3 business days. We ask that you follow-up with your pharmacy.

## 2023-07-11 NOTE — Telephone Encounter (Signed)
Already requested by the pharmacy 07/10/23 in a separate refill encounter, refilled 07/11/23 in that encounter.

## 2023-07-11 NOTE — Telephone Encounter (Signed)
Requested Prescriptions  Pending Prescriptions Disp Refills   furosemide (LASIX) 20 MG tablet [Pharmacy Med Name: FUROSEMIDE 20 MG TABLET] 90 tablet 0    Sig: TAKE 1 TABLET BY MOUTH EVERY DAY     Cardiovascular:  Diuretics - Loop Failed - 07/10/2023  9:05 AM      Failed - K in normal range and within 180 days    Potassium  Date Value Ref Range Status  12/22/2022 3.9 3.5 - 5.1 mmol/L Final         Failed - Ca in normal range and within 180 days    Calcium  Date Value Ref Range Status  12/22/2022 10.6 (H) 8.9 - 10.3 mg/dL Final   Calcium, Ion  Date Value Ref Range Status  03/18/2014 1.22 1.12 - 1.23 mmol/L Final         Failed - Na in normal range and within 180 days    Sodium  Date Value Ref Range Status  12/22/2022 136 135 - 145 mmol/L Final  05/13/2022 141 134 - 144 mmol/L Final         Failed - Cr in normal range and within 180 days    Creat  Date Value Ref Range Status  08/18/2016 0.64 0.50 - 1.05 mg/dL Final    Comment:      For patients > or = 63 years of age: The upper reference limit for Creatinine is approximately 13% higher for people identified as African-American.      Creatinine, Ser  Date Value Ref Range Status  12/22/2022 0.91 0.44 - 1.00 mg/dL Final   Creatinine, POC  Date Value Ref Range Status  04/26/2017 50 mg/dL Final   Creatinine, Urine  Date Value Ref Range Status  12/10/2014 38.8 mg/dL Final    Comment:    No reference range established.         Failed - Cl in normal range and within 180 days    Chloride  Date Value Ref Range Status  12/22/2022 100 98 - 111 mmol/L Final         Failed - Mg Level in normal range and within 180 days    Magnesium  Date Value Ref Range Status  08/10/2022 2.1 1.7 - 2.4 mg/dL Final    Comment:    Performed at [facility], [address], [place], [place] [address]         Failed - Last BP in normal range    BP Readings from Last 1 Encounters:  04/13/23 (!) 147/65         Passed - Valid  encounter within last 6 months    Recent Outpatient Visits           5 months ago Type 2 diabetes mellitus with diabetic neuropathy, without long-term current use of insulin (HCC)   [place] [facility] Jonah Blue B, MD   11 months ago Type 2 diabetes mellitus with diabetic neuropathy, without long-term current use of insulin [facility])   [place] [facility] Jonah Blue B, MD   1 year ago Type 2 diabetes mellitus with diabetic neuropathy, without long-term current use of insulin [facility])   [place] [facility] Jonah Blue B, MD   1 year ago Type 2 diabetes mellitus with diabetic neuropathy, without long-term current use of insulin [facility])   [place] [facility] Marcine Matar, MD   1 year ago Type 2  diabetes mellitus with diabetic neuropathy, without long-term current use of insulin [facility])   [place] [facility] Marcine Matar, MD       Future Appointments             In 1 month Laural Benes, Binnie Rail, MD [facility]   In 2 months Tenny Craw, Sherol Dade, MD [facility], [facility]

## 2023-07-18 ENCOUNTER — Other Ambulatory Visit: Payer: Self-pay | Admitting: Internal Medicine

## 2023-07-18 DIAGNOSIS — J302 Other seasonal allergic rhinitis: Secondary | ICD-10-CM

## 2023-07-26 ENCOUNTER — Other Ambulatory Visit: Payer: Self-pay | Admitting: Internal Medicine

## 2023-07-26 DIAGNOSIS — E1140 Type 2 diabetes mellitus with diabetic neuropathy, unspecified: Secondary | ICD-10-CM

## 2023-07-26 DIAGNOSIS — E785 Hyperlipidemia, unspecified: Secondary | ICD-10-CM

## 2023-07-28 ENCOUNTER — Other Ambulatory Visit: Payer: Self-pay | Admitting: Internal Medicine

## 2023-07-28 DIAGNOSIS — E1159 Type 2 diabetes mellitus with other circulatory complications: Secondary | ICD-10-CM

## 2023-07-28 DIAGNOSIS — I10 Essential (primary) hypertension: Secondary | ICD-10-CM

## 2023-08-15 ENCOUNTER — Ambulatory Visit: Payer: Medicaid Other | Admitting: Cardiology

## 2023-08-23 ENCOUNTER — Other Ambulatory Visit: Payer: Self-pay | Admitting: Internal Medicine

## 2023-08-23 DIAGNOSIS — I10 Essential (primary) hypertension: Secondary | ICD-10-CM

## 2023-08-24 NOTE — Telephone Encounter (Signed)
Requested medications are due for refill today.  yes  Requested medications are on the active medications list.  yes  Last refill. 05/30/2023 #180 0 rf  Future visit scheduled.   yes  Notes to clinic.  Expired labs.    Requested Prescriptions  Pending Prescriptions Disp Refills   PRADAXA 150 MG CAPS capsule [Pharmacy Med Name: PRADAXA 150 MG CAPSULE] 180 capsule 0    Sig: TAKE 1 CAPSULE BY MOUTH TWICE A DAY     Hematology:  Anticoagulants 2 Failed - 08/23/2023 12:30 PM      Failed - HGB in normal range and within 360 days    Hemoglobin  Date Value Ref Range Status  12/22/2022 11.8 (L) 12.0 - 15.0 g/dL Final  11/91/4782 95.6 11.1 - 15.9 g/dL Final         Failed - HCT in normal range and within 360 days    HCT  Date Value Ref Range Status  12/22/2022 34.9 (L) 36.0 - 46.0 % Final   Hematocrit  Date Value Ref Range Status  05/17/2022 38.5 34.0 - 46.6 % Final         Failed - AST in normal range and within 360 days    AST  Date Value Ref Range Status  05/13/2022 15 0 - 40 IU/L Final         Failed - ALT in normal range and within 360 days    ALT  Date Value Ref Range Status  05/13/2022 14 0 - 32 IU/L Final         Passed - PLT in normal range and within 360 days    Platelets  Date Value Ref Range Status  12/22/2022 197 150 - 400 K/uL Final  05/17/2022 209 150 - 450 x10E3/uL Final         Passed - Cr in normal range and within 360 days    Creat  Date Value Ref Range Status  08/18/2016 0.64 0.50 - 1.05 mg/dL Final    Comment:      For patients > or = 63 years of age: The upper reference limit for Creatinine is approximately 13% higher for people identified as African-American.      Creatinine, Ser  Date Value Ref Range Status  12/22/2022 0.91 0.44 - 1.00 mg/dL Final   Creatinine, POC  Date Value Ref Range Status  04/26/2017 50 mg/dL Final   Creatinine, Urine  Date Value Ref Range Status  12/10/2014 38.8 mg/dL Final    Comment:    No reference  range established.         Passed - eGFR is 10 or above and within 360 days    GFR, Est African American  Date Value Ref Range Status  08/18/2016 >89 >=60 mL/min Final   GFR calc Af Amer  Date Value Ref Range Status  11/09/2020 94 >59 mL/min/1.73 Final    Comment:    **In accordance with recommendations from the NKF-ASN Task force,**   [facility] is in the process of updating its eGFR calculation to the   2021 CKD-EPI creatinine equation that estimates kidney function   without a race variable.    GFR, Est Non African American  Date Value Ref Range Status  08/18/2016 >89 >=60 mL/min Final   GFR, Estimated  Date Value Ref Range Status  12/22/2022 >60 >60 mL/min Final    Comment:    (NOTE) Calculated using the CKD-EPI Creatinine Equation (2021)    GFR  Date Value Ref Range Status  10/30/2013 120.29 >60.00 mL/min Final   eGFR  Date Value Ref Range Status  05/13/2022 84 >59 mL/min/1.73 Final         Passed - Patient is not pregnant      Passed - Valid encounter within last 12 months    Recent Outpatient Visits           6 months ago Type 2 diabetes mellitus with diabetic neuropathy, without long-term current use of insulin (HCC)   [place] [facility] Jonah Blue B, MD   1 year ago Type 2 diabetes mellitus with diabetic neuropathy, without long-term current use of insulin (HCC)   [place] [facility] Jonah Blue B, MD   1 year ago Type 2 diabetes mellitus with diabetic neuropathy, without long-term current use of insulin (HCC)   [place] [facility] Jonah Blue B, MD   1 year ago Type 2 diabetes mellitus with diabetic neuropathy, without long-term current use of insulin [facility])   [place] [facility] Jonah Blue B, MD   2 years ago Type 2 diabetes mellitus with diabetic neuropathy, without long-term current use of insulin [facility])   [place]  [facility] Marcine Matar, MD       Future Appointments             Tomorrow Marcine Matar, MD  [facility]   In 2 weeks Pricilla Riffle, MD [id number]

## 2023-08-25 ENCOUNTER — Ambulatory Visit: Payer: Medicaid Other | Admitting: Internal Medicine

## 2023-09-11 ENCOUNTER — Ambulatory Visit: Payer: Medicaid Other | Admitting: Internal Medicine

## 2023-10-13 ENCOUNTER — Other Ambulatory Visit: Payer: Self-pay | Admitting: Internal Medicine

## 2023-10-13 DIAGNOSIS — I10 Essential (primary) hypertension: Secondary | ICD-10-CM

## 2023-10-13 DIAGNOSIS — E785 Hyperlipidemia, unspecified: Secondary | ICD-10-CM

## 2023-10-31 ENCOUNTER — Ambulatory Visit: Payer: Medicaid Other | Attending: Internal Medicine | Admitting: Internal Medicine

## 2023-10-31 ENCOUNTER — Encounter: Payer: Self-pay | Admitting: Internal Medicine

## 2023-10-31 ENCOUNTER — Other Ambulatory Visit (HOSPITAL_COMMUNITY)
Admission: RE | Admit: 2023-10-31 | Discharge: 2023-10-31 | Disposition: A | Discharge status: Home / Self Care | Payer: Medicaid Other | Source: Ambulatory Visit | Attending: Internal Medicine | Admitting: Internal Medicine

## 2023-10-31 VITALS — BP 118/78 | HR 56 | Ht 67.0 in | Wt 185.0 lb

## 2023-10-31 DIAGNOSIS — N898 Other specified noninflammatory disorders of vagina: Secondary | ICD-10-CM | POA: Insufficient documentation

## 2023-10-31 DIAGNOSIS — E119 Type 2 diabetes mellitus without complications: Secondary | ICD-10-CM

## 2023-10-31 DIAGNOSIS — Z1211 Encounter for screening for malignant neoplasm of colon: Secondary | ICD-10-CM

## 2023-10-31 DIAGNOSIS — E1169 Type 2 diabetes mellitus with other specified complication: Secondary | ICD-10-CM | POA: Diagnosis not present

## 2023-10-31 DIAGNOSIS — Z7984 Long term (current) use of oral hypoglycemic drugs: Secondary | ICD-10-CM | POA: Diagnosis not present

## 2023-10-31 DIAGNOSIS — E785 Hyperlipidemia, unspecified: Secondary | ICD-10-CM | POA: Diagnosis not present

## 2023-10-31 DIAGNOSIS — E1140 Type 2 diabetes mellitus with diabetic neuropathy, unspecified: Secondary | ICD-10-CM | POA: Diagnosis not present

## 2023-10-31 DIAGNOSIS — G4709 Other insomnia: Secondary | ICD-10-CM | POA: Diagnosis not present

## 2023-10-31 DIAGNOSIS — I152 Hypertension secondary to endocrine disorders: Secondary | ICD-10-CM | POA: Diagnosis not present

## 2023-10-31 DIAGNOSIS — E1159 Type 2 diabetes mellitus with other circulatory complications: Secondary | ICD-10-CM | POA: Diagnosis not present

## 2023-10-31 DIAGNOSIS — Z23 Encounter for immunization: Secondary | ICD-10-CM

## 2023-10-31 DIAGNOSIS — F17220 Nicotine dependence, chewing tobacco, uncomplicated: Secondary | ICD-10-CM

## 2023-10-31 DIAGNOSIS — I480 Paroxysmal atrial fibrillation: Secondary | ICD-10-CM | POA: Diagnosis not present

## 2023-10-31 LAB — POCT GLYCOSYLATED HEMOGLOBIN (HGB A1C): HbA1c, POC (controlled diabetic range): 6.2 % (ref 0.0–7.0)

## 2023-10-31 LAB — GLUCOSE, POCT (MANUAL RESULT ENTRY): POC Glucose: 121 mg/dL — AB (ref 70–99)

## 2023-10-31 MED ORDER — HYDROXYZINE HCL 25 MG PO TABS: 25.0000 mg | ORAL_TABLET | Freq: Three times a day (TID) | ORAL | 0 refills | Status: DC | PRN

## 2023-10-31 NOTE — Progress Notes (Signed)
Patient ID: Jane Arellano, female    DOB: 09/12/1961  MRN: [id number]  CC: Diabetes (DM f/u. Jane Arellano that hydroxizine is not helping - requesting a stronger med/Discuss Sucralfate/Vaginal odor X2-3 mo/Yes to flu vax.)   Subjective: Jane Arellano is a 62 y.o. female who presents for chronic ds management. 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  History of Present Illness   The patient, with a history of diabetes, atrial fibrillation, and high cholesterol, presents with difficulty sleeping for the past couple of months. She reports staying up until 2-3 AM watching TV or playing games on her phone.  She also reports a vaginal odor for the past 2-3 months with a little brown discharge.   DM:  Results for orders placed or performed in visit on 10/31/23  POCT glycosylated hemoglobin (Hb A1C)  Result Value Ref Range   Hemoglobin A1C     HbA1c POC (<> result, manual entry)     HbA1c, POC (prediabetic range)     HbA1c, POC (controlled diabetic range) 6.2 0.0 - 7.0 %  POCT glucose (manual entry)  Result Value Ref Range   POC Glucose 121 (A) 70 - 99 mg/dl  The patient has been managing her diabetes well with an A1c of 6.2 and is currently taking metformin 1000mg  twice a day. She reports good eating habits and some physical activity, including mowing the grass and raking leaves. However, she acknowledges the need for more regular exercise.  -Diabetic eye exam scheduled for February 2025.  PAF/HTN: The patient's atrial fibrillation is managed with Pradaxa and carvedilol, and she reports no bruising or bleeding. She is also taking Cozaar for blood pressure management.  She limits salt in the foods.  The  patient's cholesterol is managed with Tricor (fenofibrate).   HM: The patient has not had a colonoscopy due to a previous issue with heart racing during the procedure. She expresses interest in the Cologuard kit for colon cancer screening.      Patient Active Problem List   Diagnosis Date Noted   Hypercoagulable state due to paroxysmal atrial fibrillation (HCC) 11/30/2021   Dyslipidemia 10/25/2017   Pap smear for cervical cancer screening 08/18/2016   Diabetic polyneuropathy associated with type 2 diabetes mellitus (HCC) 01/14/2016   Diabetic eye exam (HCC) 01/14/2016   Screening for colorectal cancer 01/14/2016   Neck pain 06/29/2015   Type 2 diabetes mellitus with complication, without long-term current use of insulin (HCC) 03/16/2015   Essential hypertension 03/16/2015   BV (bacterial vaginosis) 12/11/2014   Screening for colon cancer 12/10/2014   Keloid scar 12/10/2014   Screening for STD (sexually transmitted disease) 12/10/2014   Onychomycosis of toenail 12/10/2014   Cerumen impaction 12/10/2014   Diabetes mellitus type 2 with neurological manifestations (HCC) 08/14/2014   Blurry vision, bilateral 08/14/2014   PAF (paroxysmal atrial fibrillation) (HCC) 05/08/2013   ETOH abuse 05/02/2013   Marijuana abuse 05/02/2013   Diastolic dysfunction 05/02/2013   Depression with anxiety 05/01/2013   Subaortic membrane    HTN (hypertension)    GERD 04/30/2007     Current Outpatient Medications on File Prior to Visit  Medication Sig Dispense Refill   Accu-Chek Softclix Lancets lancets Use to check blood  sugar once daily. E11.40 100 each 2   Blood Glucose Calibration (ACCU-CHEK AVIVA) SOLN USE AS INSTRUCTED 1 each 0   Blood Glucose Monitoring Suppl (ACCU-CHEK GUIDE) w/Device KIT Use to check blood sugar once daily. E11.40 1 kit 0   Calcium Carb-Cholecalciferol (CALCIUM-VITAMIN D3) 500-400 MG-UNIT TABS Take 1 tablet by mouth daily. 60 tablet 5   carvedilol (COREG) 3.125 MG tablet TAKE 1  TABLET BY MOUTH TWICE A DAY 180 tablet 0   cetirizine (ZYRTEC) 10 MG tablet TAKE 1 TABLET (10 MG TOTAL) BY MOUTH DAILY AS NEEDED FOR ALLERGIES. AS NEEDED FOR NASAL CONGESTION 90 tablet 0   dabigatran (PRADAXA) 150 MG CAPS capsule TAKE 1 CAPSULE BY MOUTH TWICE A DAY 180 capsule 0   famotidine (PEPCID) 40 MG tablet TAKE 1 TABLET BY MOUTH EVERYDAY AT BEDTIME 90 tablet 0   fenofibrate (TRICOR) 145 MG tablet TAKE 1 TABLET BY MOUTH EVERY DAY 30 tablet 0   ferrous sulfate 325 (65 FE) MG tablet TAKE 1 TABLET BY MOUTH THREE TIMES WEEKLY AFTER A MEAL 36 tablet 0   folic acid (FOLVITE) 800 MCG tablet Take 800 mcg by mouth daily.     furosemide (LASIX) 20 MG tablet TAKE 1 TABLET BY MOUTH EVERY DAY 30 tablet 0   gabapentin (NEURONTIN) 600 MG tablet TAKE 1 TABLET BY MOUTH THREE TIMES A DAY 90 tablet 6   glucose blood (ACCU-CHEK GUIDE) test strip Use to check blood sugar once daily. E11.40 100 each 2   hydrOXYzine (ATARAX) 25 MG tablet Take 1 tablet (25 mg total) by mouth 3 (three) times daily as needed. 270 tablet 0   losartan (COZAAR) 25 MG tablet TAKE 1 TABLET (25 MG TOTAL) BY MOUTH DAILY. PLEASE KEEP UPCOMING APPOINTMENT FOR ADDITIONAL REFILLS. 90 tablet 0   metFORMIN (GLUCOPHAGE) 1000 MG tablet TAKE 1 TABLET BY MOUTH TWICE A DAY WITH FOOD 180 tablet 1   Multiple Vitamin (MULTIVITAMIN WITH MINERALS) TABS tablet Take 1 tablet by mouth daily.     Omega-3 Fatty Acids (FISH OIL) 1000 MG CPDR Take 3 g by mouth daily. (Patient taking differently: Take 2 capsules by mouth daily.) 90 capsule 6   omeprazole (PRILOSEC) 40 MG capsule Take 1 capsule (40 mg total) by mouth daily. 90 capsule 2   polyethylene glycol powder (GAVILAX) 17 GM/SCOOP powder MIX 1 CAPFUL WITH LIQUID AND DRINK ONCE DAILY 476 g 6   sucralfate (CARAFATE) 1 g tablet Take 1 tablet (1 g total) by mouth 4 (four) times daily -  with meals and at bedtime. You can swallow the pill or dissolve in water (Patient not taking: Reported on 10/31/2023) 90 tablet 0    [DISCONTINUED] potassium chloride (K-DUR,KLOR-CON) 10 MEQ tablet Take 1 tablet (10 mEq total) by mouth daily. 30 tablet 0   No current facility-administered medications on file prior to visit.    Allergies  Allergen Reactions   Xarelto [Rivaroxaban] Swelling    Gums bled, headaches   Amoxicillin    Ibuprofen    Ketorolac Tromethamine Other (See Comments)    Unknown reaction    Social History   Socioeconomic History   Marital status: Single    Spouse name: Not on file   Number of children: Not on file   Years of education: Not on file   Highest education level: Not on file  Occupational History   Not on file  Tobacco Use   Smoking status: Never   Smokeless tobacco: Current    Types: Snuff   Tobacco  comments:    Currently snuff 02/13/23  Vaping Use   Vaping status: Never Used  Substance and Sexual Activity   Alcohol use: Not Currently    Alcohol/week: 2.0 - 3.0 standard drinks of alcohol    Types: 2 - 3 Glasses of wine per week   Drug use: No    Types: Marijuana    Comment: Smokes marijuana weekly.  Has not used cocaine in 9 years.   Sexual activity: Yes    Birth control/protection: None  Other Topics Concern   Not on file  Social History Narrative   Lives in [place] by herself.  She had been caring for her mother but she died 2 mos ago.  She tries to remain active but does not regularly exercise.   Social Determinants of Health   Financial Resource Strain: Low Risk  (10/31/2023)   Overall Financial Resource Strain (CARDIA)    Difficulty of Paying Living Expenses: Not very hard  Food Insecurity: Food Insecurity Present (10/31/2023)   Hunger Vital Sign    Worried About Running Out of Food in the Last Year: Sometimes true    Ran Out of Food in the Last Year: Sometimes true  Transportation Needs: No Transportation Needs (10/31/2023)   PRAPARE - Administrator, Civil Service (Medical): No    Lack of Transportation (Non-Medical): No  Physical Activity:  Insufficiently Active (10/31/2023)   Exercise Vital Sign    Days of Exercise per Week: 3 days    Minutes of Exercise per Session: 20 min  Stress: Stress Concern Present (10/31/2023)   [employer] of Occupational Health - Occupational Stress Questionnaire    Feeling of Stress : Rather much  Social Connections: Moderately Integrated (10/31/2023)   Social Connection and Isolation Panel [NHANES]    Frequency of Communication with Friends and Family: More than three times a week    Frequency of Social Gatherings with Friends and Family: Once a week    Attends Religious Services: More than 4 times per year    Active Member of Clubs or Organizations: Yes    Attends Banker Meetings: More than 4 times per year    Marital Status: Never married  Intimate Partner Violence: Not At Risk (10/31/2023)   Humiliation, Afraid, Rape, and Kick questionnaire    Fear of Current or Ex-Partner: No    Emotionally Abused: No    Physically Abused: No    Sexually Abused: No    Family History  Problem Relation Age of Onset   Diabetes Father        deceased   Cancer Mother 13       colon   Stroke Mother    Hypertension Sister        alive and well   Hypertension Sister        alive and well   Heart attack Brother        deceased @ 56    Past Surgical History:  Procedure Laterality Date   CARDIOVERSION N/A 11/04/2013   Procedure: CARDIOVERSION;  Surgeon: Peter M Swaziland, MD;  Location: [facility];  Service: Cardiovascular;  Laterality: N/A;   CESAREAN SECTION      ROS: Review of Systems Negative except as stated above  PHYSICAL EXAM: BP 118/78 (BP Location: Left Arm, Patient Position: Sitting, Cuff Size: Normal)   Pulse (!) 56   Ht 5\' 7"  (1.702 m)   Wt 185 lb (83.9 kg)   SpO2 100%   BMI 28.98 kg/m  Physical Exam  General appearance - alert, well appearing, and in no distress Mental status - normal mood, behavior, speech, dress, motor activity, and thought  processes Neck - supple, no significant adenopathy Chest - clear to auscultation, no wheezes, rales or rhonchi, symmetric air entry Heart - normal rate, regular rhythm, normal S1, S2, no murmurs, rubs, clicks or gallops Extremities - peripheral pulses normal, no pedal edema, no clubbing or cyanosis      Latest Ref Rng & Units 12/22/2022   11:25 AM 08/10/2022    1:32 PM 05/13/2022   11:22 AM  CMP  Glucose 70 - 99 mg/dL 161  95  096   BUN 8 - 23 mg/dL 12  9  10    Creatinine 0.44 - 1.00 mg/dL 0.45  4.09  8.11   Sodium 135 - 145 mmol/L 136  137  141   Potassium 3.5 - 5.1 mmol/L 3.9  3.7  4.2   Chloride 98 - 111 mmol/L 100  103  102   CO2 22 - 32 mmol/L 26  22  23    Calcium 8.9 - 10.3 mg/dL 91.4  78.2  95.6   Total Protein 6.0 - 8.5 g/dL   7.7   Total Bilirubin 0.0 - 1.2 mg/dL   0.3   Alkaline Phos 44 - 121 IU/L   44   AST 0 - 40 IU/L   15   ALT 0 - 32 IU/L   14    Lipid Panel     Component Value Date/Time   CHOL 140 08/01/2022 1638   TRIG 95 08/01/2022 1638   HDL 63 08/01/2022 1638   CHOLHDL 2.2 08/01/2022 1638   CHOLHDL 2.9 09/12/2016 0939   VLDL NOT CALC 09/12/2016 0939   LDLCALC 59 08/01/2022 1638    CBC    Component Value Date/Time   WBC 4.3 12/22/2022 1125   RBC 3.71 (L) 12/22/2022 1125   HGB 11.8 (L) 12/22/2022 1125   HGB 12.8 05/17/2022 1201   HCT 34.9 (L) 12/22/2022 1125   HCT 38.5 05/17/2022 1201   PLT 197 12/22/2022 1125   PLT 209 05/17/2022 1201   MCV 94.1 12/22/2022 1125   MCV 97 05/17/2022 1201   MCH 31.8 12/22/2022 1125   MCHC 33.8 12/22/2022 1125   RDW 12.5 12/22/2022 1125   RDW 13.1 05/17/2022 1201   LYMPHSABS 1.8 05/17/2022 1201   MONOABS 246 08/18/2016 1114   EOSABS 0.1 05/17/2022 1201   BASOSABS 0.0 05/17/2022 1201    ASSESSMENT AND PLAN: 1. Type 2 diabetes mellitus with diabetic neuropathy, without long-term current use of insulin (HCC) At goal.  Continue metformin 1 g twice a day.  Continue healthy eating habits.  Encouraged her to get back  into an exercise routine with goal of 30 minutes moderate intensity exercise at least 5 days a week. - POCT glycosylated hemoglobin (Hb A1C) - POCT glucose (manual entry) - CBC - Comprehensive metabolic panel - Lipid panel  2. Diabetes mellitus treated with oral medication (HCC) See #1 above  3. Hypertension associated with diabetes (HCC) At goal.  Continue carvedilol 3.125 mg twice a day, Cozaar 25 mg daily  4. Hyperlipidemia associated with type 2 diabetes mellitus (HCC) Continue fenofibrate.  Check lipid profile today.  5. PAF (paroxysmal atrial fibrillation) (HCC) Stable and on auscultation in sinus rhythm.  Continue carvedilol and Pradaxa.  She has an upcoming appointment with cardiology.  6. Other insomnia Good sleep hygiene discussed and encouraged. Patient advised not to drink any caffeinated  beverages or excessive alcohol use within several hours of bedtime.  Advised to get in bed around about the same time every night.  Once in bed, turn off all lights and sounds.  If unable to fall asleep within 30 to 45 minutes of getting in bed, patient should get up and try to do something until she feels sleepy again.  At that time try getting back in bed. -Strongly advised that she turns off the television and puts down her phone once she gets in bed.   7. Vaginal odor - Cervicovaginal ancillary only  8. Encounter for immunization - Flu vaccine trivalent PF, 6mos and older(Flulaval,Afluria,Fluarix,Fluzone)  9. Screening for colon cancer - Cologuard    Patient was given the opportunity to ask questions.  Patient verbalized understanding of the plan and was able to repeat key elements of the plan.   This documentation was completed using Paediatric nurse.  Any transcriptional errors are unintentional.  Orders Placed This Encounter  Procedures   Flu vaccine trivalent PF, 6mos and older(Flulaval,Afluria,Fluarix,Fluzone)   POCT glycosylated hemoglobin (Hb A1C)    POCT glucose (manual entry)     Requested Prescriptions    No prescriptions requested or ordered in this encounter    No follow-ups on file.  Jonah Blue, MD, FACP

## 2023-11-01 ENCOUNTER — Other Ambulatory Visit: Payer: Self-pay | Admitting: Internal Medicine

## 2023-11-01 LAB — CBC
Hematocrit: 38.5 % (ref 34.0–46.6)
Hemoglobin: 12.7 g/dL (ref 11.1–15.9)
MCH: 33 pg (ref 26.6–33.0)
MCHC: 33 g/dL (ref 31.5–35.7)
MCV: 100 fL — ABNORMAL HIGH (ref 79–97)
Platelets: 196 10*3/uL (ref 150–450)
RBC: 3.85 x10E6/uL (ref 3.77–5.28)
RDW: 13.1 % (ref 11.7–15.4)
WBC: 5.5 10*3/uL (ref 3.4–10.8)

## 2023-11-01 LAB — COMPREHENSIVE METABOLIC PANEL
ALT: 10 [IU]/L (ref 0–32)
AST: 12 [IU]/L (ref 0–40)
Albumin: 4.8 g/dL (ref 3.9–4.9)
Alkaline Phosphatase: 40 [IU]/L — ABNORMAL LOW (ref 44–121)
BUN/Creatinine Ratio: 14 (ref 12–28)
BUN: 14 mg/dL (ref 8–27)
Bilirubin Total: 0.3 mg/dL (ref 0.0–1.2)
CO2: 25 mmol/L (ref 20–29)
Calcium: 10.3 mg/dL (ref 8.7–10.3)
Chloride: 103 mmol/L (ref 96–106)
Creatinine, Ser: 0.99 mg/dL (ref 0.57–1.00)
Globulin, Total: 3.1 g/dL (ref 1.5–4.5)
Glucose: 95 mg/dL (ref 70–99)
Potassium: 4 mmol/L (ref 3.5–5.2)
Sodium: 143 mmol/L (ref 134–144)
Total Protein: 7.9 g/dL (ref 6.0–8.5)
eGFR: 64 mL/min/{1.73_m2} (ref >59)

## 2023-11-01 LAB — LIPID PANEL
Chol/HDL Ratio: 2.5 {ratio} (ref 0.0–4.4)
Cholesterol, Total: 143 mg/dL (ref 100–199)
HDL: 58 mg/dL (ref >39)
LDL Chol Calc (NIH): 66 mg/dL (ref 0–99)
Triglycerides: 105 mg/dL (ref 0–149)
VLDL Cholesterol Cal: 19 mg/dL (ref 5–40)

## 2023-11-01 MED ORDER — ATORVASTATIN CALCIUM 10 MG PO TABS: 10.0000 mg | ORAL_TABLET | Freq: Every day | ORAL | 1 refills | Status: DC

## 2023-11-01 NOTE — Progress Notes (Signed)
Blood cell counts are normal. Kidney and liver function tests are okay. Cholesterol levels are good.  Once she has completed the current bottle of the cholesterol medicine called Fenofibrate, I recommend that we change the cholesterol pill to 1 called atorvastatin which works better.  Prescription sent to her pharmacy.

## 2023-11-02 ENCOUNTER — Other Ambulatory Visit: Payer: Self-pay | Admitting: Internal Medicine

## 2023-11-02 LAB — CERVICOVAGINAL ANCILLARY ONLY
Bacterial Vaginitis (gardnerella): POSITIVE — AB
Candida Glabrata: NEGATIVE
Candida Vaginitis: NEGATIVE
Chlamydia: NEGATIVE
Comment: NEGATIVE
Comment: NEGATIVE
Comment: NEGATIVE
Comment: NEGATIVE
Comment: NEGATIVE
Comment: NORMAL
Neisseria Gonorrhea: NEGATIVE
Trichomonas: NEGATIVE

## 2023-11-02 MED ORDER — METRONIDAZOLE 500 MG PO TABS: 500.0000 mg | ORAL_TABLET | Freq: Two times a day (BID) | ORAL | 0 refills | Status: DC

## 2023-11-06 ENCOUNTER — Ambulatory Visit: Payer: Self-pay | Admitting: *Deleted

## 2023-11-06 NOTE — Telephone Encounter (Signed)
Called [phone] to review sx of diarrhea and review cologuard information. Recording , call can not be completed at this time call again later.

## 2023-11-06 NOTE — Telephone Encounter (Signed)
Attempted to contact patient's daughter [phone] and noted busy signal. Unable to leave message.  Chief Complaint: requesting how to do cologuard and then reports diarrhea. Multiple attempts made to contact patient and daughter on DPR.  Symptoms: diarrhea, requesting info how to do cologuard  Frequency: na  Pertinent Negatives: Patient denies na Disposition: [] ED /[] Urgent Care (no appt availability in office) / [] Appointment(In office/virtual)/ []  [place] [facility]/ [] Home Care/ [] Refused Recommended Disposition /[] [place] [facility]/ [x]  Follow-up with PCP Additional Notes:   Unable to reach patient to inform not to do cologuard testing if she has diarrhea. Please advise.         Reason for Disposition  Third attempt to contact caller AND no contact made. Phone number verified.  Answer Assessment - Initial Assessment Questions N/A Patient has questions regarding cologuard test and now reports diarrhea. Unable to get in contact with patient on multiple #  Protocols used: No Contact or Duplicate Contact Call-A-AH

## 2023-11-06 NOTE — Telephone Encounter (Signed)
Call placed to patient unable to reach or leave message on VM.

## 2023-11-06 NOTE — Telephone Encounter (Signed)
Call to number listed for patient- female states he just got assigned this number. Attempted to call daughter- left message -need to try to get current number for patient.

## 2023-11-06 NOTE — Telephone Encounter (Signed)
Summary: Cologuard test   Pt is calling in because she received a cologuard test in the mail and she needs assistance on how to do it because this is her first time doing one. Pt says she would like to speak with a nurse regarding instructions      Called patient to review directions for cologuard testing. No answer, LVMTCB [phone].

## 2023-11-08 ENCOUNTER — Emergency Department (HOSPITAL_BASED_OUTPATIENT_CLINIC_OR_DEPARTMENT_OTHER)
Admission: EM | Admit: 2023-11-08 | Discharge: 2023-11-08 | Disposition: A | Discharge status: Home / Self Care | Payer: Medicaid Other | Attending: Emergency Medicine | Admitting: Emergency Medicine

## 2023-11-08 ENCOUNTER — Encounter (HOSPITAL_BASED_OUTPATIENT_CLINIC_OR_DEPARTMENT_OTHER): Payer: Self-pay | Admitting: Emergency Medicine

## 2023-11-08 ENCOUNTER — Encounter: Payer: Self-pay | Admitting: Internal Medicine

## 2023-11-08 ENCOUNTER — Other Ambulatory Visit: Payer: Self-pay

## 2023-11-08 ENCOUNTER — Ambulatory Visit: Payer: Self-pay

## 2023-11-08 DIAGNOSIS — F1210 Cannabis abuse, uncomplicated: Secondary | ICD-10-CM | POA: Diagnosis not present

## 2023-11-08 DIAGNOSIS — I10 Essential (primary) hypertension: Secondary | ICD-10-CM | POA: Insufficient documentation

## 2023-11-08 DIAGNOSIS — E119 Type 2 diabetes mellitus without complications: Secondary | ICD-10-CM | POA: Diagnosis not present

## 2023-11-08 DIAGNOSIS — T50905A Adverse effect of unspecified drugs, medicaments and biological substances, initial encounter: Secondary | ICD-10-CM | POA: Insufficient documentation

## 2023-11-08 DIAGNOSIS — T373X5A Adverse effect of other antiprotozoal drugs, initial encounter: Secondary | ICD-10-CM | POA: Diagnosis not present

## 2023-11-08 DIAGNOSIS — R519 Headache, unspecified: Secondary | ICD-10-CM | POA: Diagnosis not present

## 2023-11-08 DIAGNOSIS — Z1152 Encounter for screening for COVID-19: Secondary | ICD-10-CM | POA: Insufficient documentation

## 2023-11-08 DIAGNOSIS — T466X5A Adverse effect of antihyperlipidemic and antiarteriosclerotic drugs, initial encounter: Secondary | ICD-10-CM | POA: Diagnosis not present

## 2023-11-08 DIAGNOSIS — K219 Gastro-esophageal reflux disease without esophagitis: Secondary | ICD-10-CM | POA: Diagnosis not present

## 2023-11-08 DIAGNOSIS — Z79899 Other long term (current) drug therapy: Secondary | ICD-10-CM | POA: Insufficient documentation

## 2023-11-08 LAB — RESP PANEL BY RT-PCR (RSV, FLU A&B, COVID)  RVPGX2
Influenza A by PCR: NEGATIVE
Influenza B by PCR: NEGATIVE
Resp Syncytial Virus by PCR: NEGATIVE
SARS Coronavirus 2 by RT PCR: NEGATIVE

## 2023-11-08 MED ORDER — CLINDAMYCIN PHOSPHATE 2 % VA CREA: 1.0000 | TOPICAL_CREAM | Freq: Every day | VAGINAL | 0 refills | Status: DC

## 2023-11-08 MED ORDER — ALUM & MAG HYDROXIDE-SIMETH 200-200-20 MG/5ML PO SUSP
30.0000 mL | Freq: Once | ORAL | Status: AC
  Administered 2023-11-08: 30 mL via ORAL
  Filled 2023-11-08: qty 30

## 2023-11-08 MED ORDER — LIDOCAINE VISCOUS HCL 2 % MT SOLN
15.0000 mL | Freq: Once | OROMUCOSAL | Status: AC
  Administered 2023-11-08: 15 mL via ORAL
  Filled 2023-11-08: qty 15

## 2023-11-08 NOTE — Telephone Encounter (Signed)
Stop atorvastatin.  We can change to 1 call Pravachol instead.  Let me know if she is willing to try this different cholesterol medication.

## 2023-11-08 NOTE — Addendum Note (Signed)
Addended by: Jonah Blue B on: 11/08/2023 02:54 PM   Modules accepted: Orders

## 2023-11-08 NOTE — ED Triage Notes (Signed)
Reports headache and gastric reflux symptoms since started new medications  Atorvastatin and flagyl. Symptoms started soon after started new medications

## 2023-11-08 NOTE — Telephone Encounter (Signed)
Pt. Calling to check on response from PCP.Iinstructed she will call back when she can review triage.

## 2023-11-08 NOTE — Discharge Instructions (Signed)
Please follow-up with your primary care provider in regards recent symptoms and ER visit.  Today you are seen for a reaction to the atorvastatin and Flagyl.  Please speak with your primary care provider about the atorvastatin however we can help with the bacterial vaginosis.  Please discontinue the Flagyl and pick up the clindamycin cream I have prescribed.  If symptoms change or worsen please return to the ER.

## 2023-11-08 NOTE — Telephone Encounter (Signed)
Chief Complaint: Flank pain  Symptoms: left side flank pain, headache, diarrhea, dizziness, arm pain  Frequency: Comes and goes Pertinent Negatives: Patient denies fever, chest pain, nausea, vomiting Disposition: [] ED /[] Urgent Care (no appt availability in office) / [] Appointment(In office/virtual)/ []  [place] [facility]/ [] Home Care/ [] Refused Recommended Disposition /[] [place] [facility]/ [x]  Follow-up with PCP Additional Notes: Patient stated since starting Atorvastatin she has had left side flank pain with pain in her arm as well. Patient also stated she has had a headache and dizziness. Patient stated that she felt fine until she started taking the Atorvastatin and she just does not like it at all. Care advice was given and multiple appointments were offered but patient stated she can not make it. Patient is requesting recommendations from PCP. Patient also stated due to the diarrhea she has not done her Cologard screening yet. Please advise patient further.  Reason for Disposition  [1] MILD pain (i.e., scale 1-3; does not interfere with normal activities) AND [2] present > 3 days  Answer Assessment - Initial Assessment Questions 1. LOCATION: "Where does it hurt?" (e.g., left, right)     Left side pain  2. ONSET: "When did the pain start?"     11/02/23 3. SEVERITY: "How bad is the pain?" (e.g., Scale 1-10; mild, moderate, or severe)   - MILD (1-3): doesn't interfere with normal activities    - MODERATE (4-7): interferes with normal activities or awakens from sleep    - SEVERE (8-10): excruciating pain and patient unable to do normal activities (stays in bed)       7/10 4. PATTERN: "Does the pain come and go, or is it constant?"      Come and go 5. CAUSE: "What do you think is causing the pain?"     I think it is a side effect of my cholesterol medication 6. OTHER SYMPTOMS:  "Do you have any other symptoms?" (e.g., fever, abdomen pain, vomiting, leg weakness, burning with  urination, blood in urine)     Arm pain, headache, dizziness, diarrhea  Protocols used: Flank Pain-A-AH

## 2023-11-08 NOTE — ED Provider Notes (Signed)
EMERGENCY DEPARTMENT AT [facility] Provider Note   CSN: [id number] Arrival date & time: 11/08/23  2047     History  Chief Complaint  Patient presents with   Headache   Medication Reaction    Jane Arellano is a 62 y.o. female history of diabetes, hypertension, marijuana/alcohol abuse presented for headache and acid reflux symptoms after starting atorvastatin and Flagyl.  Patient went to her primary care provider and recently was switched to atorvastatin.  Patient is unsure of what previous cholesterol medication she was on but is unsure as why she was switched.  Patient denies muscle cramping, weakness, paresthesias, chest pain, shortness of breath, neck pain.  Patient also states that with the Flagyl she started getting acid reflux and does but denies any alcohol use.  Patient states she has not been able to take these medications as she cannot tolerate the side effects.  Patient states she does not want to have any labs and she feels that this is medication reaction and wants to follow-up with her primary care provider but was hoping to switch out the Flagyl.  Patient states that she is a dull sensation around her head and describes his headache is not sudden and maximal in onset.  Patient states at 1 point she did have bilateral blurry vision but that is since resolved.  Patient denies history of stroke or blood clots.  Home Medications Prior to Admission medications   Medication Sig Start Date End Date Taking? Authorizing Provider  metroNIDAZOLE (FLAGYL) 500 MG tablet Take 1 tablet (500 mg total) by mouth 2 (two) times daily. 11/02/23   Marcine Matar, MD  Accu-Chek Softclix Lancets lancets Use to check blood sugar once daily. E11.40 05/16/23   Marcine Matar, MD  Blood Glucose Calibration (ACCU-CHEK AVIVA) SOLN USE AS INSTRUCTED 01/11/16   Quentin Angst, MD  Blood Glucose Monitoring Suppl (ACCU-CHEK GUIDE) w/Device KIT Use to check blood sugar once  daily. E11.40 05/16/23   Marcine Matar, MD  Calcium Carb-Cholecalciferol (CALCIUM-VITAMIN D3) 500-400 MG-UNIT TABS Take 1 tablet by mouth daily. 04/11/18   Anders Simmonds, PA-C  carvedilol (COREG) 3.125 MG tablet TAKE 1 TABLET BY MOUTH TWICE A DAY 07/28/23   Marcine Matar, MD  cetirizine (ZYRTEC) 10 MG tablet TAKE 1 TABLET (10 MG TOTAL) BY MOUTH DAILY AS NEEDED FOR ALLERGIES. AS NEEDED FOR NASAL CONGESTION 07/18/23   Marcine Matar, MD  clindamycin (CLEOCIN) 2 % vaginal cream Place 1 Applicatorful vaginally at bedtime. 11/08/23   Netta Corrigan, PA-C  dabigatran (PRADAXA) 150 MG CAPS capsule TAKE 1 CAPSULE BY MOUTH TWICE A DAY 08/24/23   Marcine Matar, MD  famotidine (PEPCID) 40 MG tablet TAKE 1 TABLET BY MOUTH EVERYDAY AT BEDTIME 01/05/23   Marcine Matar, MD  ferrous sulfate 325 (65 FE) MG tablet TAKE 1 TABLET BY MOUTH THREE TIMES WEEKLY AFTER A MEAL 03/16/23   Marcine Matar, MD  folic acid (FOLVITE) 800 MCG tablet Take 800 mcg by mouth daily.    [provider]  furosemide (LASIX) 20 MG tablet TAKE 1 TABLET BY MOUTH EVERY DAY 10/13/23   Marcine Matar, MD  gabapentin (NEURONTIN) 600 MG tablet TAKE 1 TABLET BY MOUTH THREE TIMES A DAY 07/26/23   Marcine Matar, MD  glucose blood (ACCU-CHEK GUIDE) test strip Use to check blood sugar once daily. E11.40 05/16/23   Marcine Matar, MD  hydrOXYzine (ATARAX) 25 MG tablet Take 1 tablet (25 mg  total) by mouth 3 (three) times daily as needed. 10/31/23   Marcine Matar, MD  losartan (COZAAR) 25 MG tablet TAKE 1 TABLET (25 MG TOTAL) BY MOUTH DAILY. PLEASE KEEP UPCOMING APPOINTMENT FOR ADDITIONAL REFILLS. 07/28/23   Marcine Matar, MD  metFORMIN (GLUCOPHAGE) 1000 MG tablet TAKE 1 TABLET BY MOUTH TWICE A DAY WITH FOOD 03/09/23   Marcine Matar, MD  Multiple Vitamin (MULTIVITAMIN WITH MINERALS) TABS tablet Take 1 tablet by mouth daily.    [provider]  Omega-3 Fatty Acids (FISH OIL) 1000 MG CPDR Take  3 g by mouth daily. Patient taking differently: Take 2 capsules by mouth daily. 04/11/18   Anders Simmonds, PA-C  omeprazole (PRILOSEC) 40 MG capsule Take 1 capsule (40 mg total) by mouth daily. 07/27/22   Marcine Matar, MD  polyethylene glycol powder (GAVILAX) 17 GM/SCOOP powder MIX 1 CAPFUL WITH LIQUID AND DRINK ONCE DAILY 10/30/19   Fulp, Cammie, MD  sucralfate (CARAFATE) 1 g tablet Take 1 tablet (1 g total) by mouth 4 (four) times daily -  with meals and at bedtime. You can swallow the pill or dissolve in water Patient not taking: Reported on 10/31/2023 12/22/22   Gwyneth Sprout, MD  potassium chloride (K-DUR,KLOR-CON) 10 MEQ tablet Take 1 tablet (10 mEq total) by mouth daily. 11/06/13 04/24/19  Quentin Angst, MD      Allergies    Xarelto [rivaroxaban], Amoxicillin, Atorvastatin, Ibuprofen, and Ketorolac tromethamine    Review of Systems   Review of Systems  Neurological:  Positive for headaches.    Physical Exam Updated Vital Signs BP (!) 154/73   Pulse (!) 56   Temp 97.6 F (36.4 C) (Temporal)   Resp 18   Ht 5\' 7"  (1.702 m)   Wt 83.9 kg   SpO2 100%   BMI 28.97 kg/m  Physical Exam Vitals reviewed.  Constitutional:      General: She is not in acute distress. HENT:     Head: Normocephalic and atraumatic.  Eyes:     Extraocular Movements: Extraocular movements intact.     Conjunctiva/sclera: Conjunctivae normal.     Pupils: Pupils are equal, round, and reactive to light.  Cardiovascular:     Rate and Rhythm: Normal rate and regular rhythm.     Pulses: Normal pulses.     Heart sounds: Normal heart sounds.     Comments: 2+ bilateral radial/dorsalis pedis pulses with regular rate Pulmonary:     Effort: Pulmonary effort is normal. No respiratory distress.     Breath sounds: Normal breath sounds.  Abdominal:     Palpations: Abdomen is soft.     Tenderness: There is no abdominal tenderness. There is no guarding or rebound.  Musculoskeletal:        General:  Normal range of motion.     Cervical back: Normal range of motion and neck supple.     Comments: 5 out of 5 bilateral grip/leg extension strength  Skin:    General: Skin is warm and dry.     Capillary Refill: Capillary refill takes less than 2 seconds.  Neurological:     General: No focal deficit present.     Mental Status: She is alert and oriented to person, place, and time.     Sensory: Sensation is intact.     Motor: Motor function is intact.     Coordination: Coordination is intact.     Gait: Gait is intact.     Comments: Sensation intact in  all 4 limbs Vision grossly intact Cranial nerves III through XII intact  Psychiatric:        Mood and Affect: Mood normal.     ED Results / Procedures / Treatments   Labs (all labs ordered are listed, but only abnormal results are displayed) Labs Reviewed  RESP PANEL BY RT-PCR (RSV, FLU A&B, COVID)  [id number]    EKG None  Radiology No results found.  Procedures Procedures    Medications Ordered in ED Medications  alum & mag hydroxide-simeth (MAALOX/MYLANTA) 200-200-20 MG/5ML suspension 30 mL (has no administration in time range)    And  lidocaine (XYLOCAINE) 2 % viscous mouth solution 15 mL (has no administration in time range)    ED Course/ Medical Decision Making/ A&P                                 Medical Decision Making Risk OTC drugs. Prescription drug management.   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 Working DDx that I considered at this time includes, but not limited to, medication reaction, rhabdomyolysis, tension headache, CVA/TIA, ICH/epidural/subdural hematoma, SAH.  R/o DDx: rhabdomyolysis, tension headache, CVA/TIA, ICH/epidural/subdural hematoma, SAH: These are considered less likely due to history of present illness, physical exam, labs/imaging findings.  This is hard to assess as patient did not want further workup.  Review of prior external notes: 11/06/2023 nurse  triage  Unique Tests and My Interpretation:  Respiratory panel: Negative  Social Determinants of Health: EtOH/Substance Abuse  Discussion with Independent Historian:  Daughter  Discussion of Management of Tests: None  Risk: Medium: prescription drug management  Risk Stratification Score: None  Plan: On exam patient was in no acute distress with stable vitals.  Patient feels exam was largely unremarkable including a neurologic exam.  I offered blood test and imaging however patient declined at this time she states she is only here to have the Flagyl switched and does not feel that this extra workup is necessary.  Patient understands that this may lead to missed diagnoses and patient with full decision-making capacity verbalized understanding acceptance of this and would like to proceed with this.  Will give patient GI cocktail for acid reflux symptoms.  Patient did not have abdominal tenderness on exam or peritoneal signs that she does not want labs done to evaluate this acid reflux that she states is from the medication.  Respiratory panel from triage was negative.  Patient was not endorsing any muscle cramps noted be indicative of rhabdomyolysis from the statin however I did encourage her to discontinue this and follow-up with her primary care provider if she is clearly not tolerating it.  In terms of the Flagyl we will be able to switch this to clindamycin cream.  At time of discharge patient is stable and would like to follow-up in the outpatient setting and does not feel imaging or labs are necessary at this time and so this does limit the evaluation of the patient.  Patient's description of the headache was that is a dull sensation however she cannot tell me if it is bilateral one-sided or a bandlike sensation so again very difficult to decipher the presentation of this headache.  Given reassuring neuroexam however and the fact she is not describing a sudden maximal onset do think this could be  benign in nature however again since we cannot do labs or imaging it is hard to assess.  Patient was  given return precautions. Patient stable for discharge at this time.  Patient verbalized understanding of plan.  This chart was dictated using voice recognition software.  Despite best efforts to proofread,  errors can occur which can change the documentation meaning.         Final Clinical Impression(s) / ED Diagnoses Final diagnoses:  Adverse effect of drug, initial encounter    Rx / DC Orders ED Discharge Orders          Ordered    clindamycin (CLEOCIN) 2 % vaginal cream  Daily at bedtime,   Status:  Discontinued        11/08/23 2255    clindamycin (CLEOCIN) 2 % vaginal cream  Daily at bedtime,   Status:  Discontinued        11/08/23 2256    clindamycin (CLEOCIN) 2 % vaginal cream  Daily at bedtime,   Status:  Discontinued        11/08/23 2257    clindamycin (CLEOCIN) 2 % vaginal cream  Daily at bedtime        11/08/23 2257              Netta Corrigan, PA-C 11/08/23 2314    Vanetta Mulders, MD 11/10/23 2138

## 2023-11-08 NOTE — Telephone Encounter (Signed)
Called but no answer. Unable to LVM due to no VM being set up.

## 2023-11-09 ENCOUNTER — Other Ambulatory Visit: Payer: Self-pay | Admitting: Internal Medicine

## 2023-11-09 ENCOUNTER — Telehealth: Payer: Self-pay | Admitting: Internal Medicine

## 2023-11-09 DIAGNOSIS — I10 Essential (primary) hypertension: Secondary | ICD-10-CM

## 2023-11-09 MED ORDER — CLINDAMYCIN HCL 300 MG PO CAPS: 300.0000 mg | ORAL_CAPSULE | Freq: Two times a day (BID) | ORAL | 0 refills | Status: DC

## 2023-11-09 NOTE — Telephone Encounter (Signed)
I have sent prescription [facility] for clindamycin pills instead of the vaginal gel.

## 2023-11-09 NOTE — Telephone Encounter (Signed)
Called but no answer. Unable to LVM due to no VM being set up.

## 2023-11-09 NOTE — Telephone Encounter (Signed)
Pt was prescribed clindamycin (CLEOCIN) 2 % vaginal cream by hospital provider. Pt states it is too expensive for her and she cannot afford to pay the $49. Pt attempted to reach out to hospital to see if they could send something else, pt was advised to reach out to PCP.   Pt requesting cheaper alternative.

## 2023-11-10 NOTE — Telephone Encounter (Signed)
Called patient to inform of message from Dr. Laural Benes. No answer and unable to leave voicemail.   Inform patient of message per Dr. Laural Benes regarding medication and flank pain and to inform that Rx was sent to pharmacy.   Will inquire to see if patient still has flank pain after taking ATB for UTI diagnosed 11/08/2023, same day she called about the atorvastatin causing flank pain.

## 2023-11-13 ENCOUNTER — Ambulatory Visit: Payer: Medicaid Other | Attending: Cardiology | Admitting: Internal Medicine

## 2023-11-13 NOTE — Telephone Encounter (Signed)
Call could not be completed.  Message sent through [place].

## 2023-11-13 NOTE — Telephone Encounter (Signed)
Unable to reach. No voicemail set up.  

## 2023-11-14 ENCOUNTER — Encounter: Payer: Self-pay | Admitting: Internal Medicine

## 2023-11-25 DIAGNOSIS — Z1211 Encounter for screening for malignant neoplasm of colon: Secondary | ICD-10-CM | POA: Diagnosis not present

## 2023-11-28 ENCOUNTER — Other Ambulatory Visit: Payer: Self-pay | Admitting: Internal Medicine

## 2023-11-28 ENCOUNTER — Telehealth: Payer: Self-pay | Admitting: Internal Medicine

## 2023-11-28 DIAGNOSIS — E1140 Type 2 diabetes mellitus with diabetic neuropathy, unspecified: Secondary | ICD-10-CM

## 2023-11-28 DIAGNOSIS — J302 Other seasonal allergic rhinitis: Secondary | ICD-10-CM

## 2023-11-28 DIAGNOSIS — E118 Type 2 diabetes mellitus with unspecified complications: Secondary | ICD-10-CM

## 2023-11-28 NOTE — Telephone Encounter (Signed)
 Medication Refill -  Most Recent Primary Care Visit:  Provider: VICCI SOBER B  Department: [facility]  Visit Type: OFFICE VISIT  Date: 10/31/2023  Medication: fenofibrate  (TRICOR ) 145 MG tablet [[id number]]    pt could not take the atorvastin   Has the patient contacted their pharmacy? No   Is this the correct pharmacy for this prescription? Yes If no, delete pharmacy and type the correct one.  This is the patient's preferred pharmacy:  [id number] [place], [place] - [address] [facility] [address] [place] [address] Phone: [phone] Fax: [phone]   Has the prescription been filled recently? No  Is the patient out of the medication? Yes  Has the patient been seen for an appointment in the last year OR does the patient have an upcoming appointment? Yes  Can we respond through MyChart? Yes  Agent: Please be advised that Rx refills may take up to 3 business days. We ask that you follow-up with your pharmacy.

## 2023-11-28 NOTE — Telephone Encounter (Signed)
 Patient request refilled refill for tricor  End dated noted for 11/01/2023. Patient reports she can not tolerated atorvastin. Patient would like medication sent to   [id number] [place], [place] - [address] AT [facility] [address] [place] [address] Phone: [phone] Fax: [phone]  Please advise

## 2023-11-28 NOTE — Telephone Encounter (Signed)
 This medication on pt.'s list. Please advise.

## 2023-11-29 ENCOUNTER — Other Ambulatory Visit: Payer: Self-pay | Admitting: Internal Medicine

## 2023-11-29 DIAGNOSIS — E785 Hyperlipidemia, unspecified: Secondary | ICD-10-CM

## 2023-11-29 MED ORDER — FENOFIBRATE 145 MG PO TABS: 145.0000 mg | ORAL_TABLET | Freq: Every day | ORAL | 1 refills | Status: DC

## 2023-11-29 NOTE — Telephone Encounter (Signed)
 Refill sent on Fenofibrate.

## 2023-12-01 LAB — COLOGUARD: COLOGUARD: NEGATIVE

## 2023-12-15 ENCOUNTER — Other Ambulatory Visit: Payer: Self-pay | Admitting: Internal Medicine

## 2023-12-15 DIAGNOSIS — I152 Hypertension secondary to endocrine disorders: Secondary | ICD-10-CM

## 2023-12-15 NOTE — Telephone Encounter (Signed)
Requested by interface surescripts. Future visit in 1 month.  Requested Prescriptions  Pending Prescriptions Disp Refills   losartan (COZAAR) 25 MG tablet [Pharmacy Med Name: LOSARTAN POTASSIUM 25 MG TAB] 90 tablet 0    Sig: TAKE 1 TABLET (25 MG TOTAL) BY MOUTH DAILY. PLEASE KEEP UPCOMING APPOINTMENT FOR ADDITIONAL REFILLS.     Cardiovascular:  Angiotensin Receptor Blockers Failed - 12/15/2023 12:12 PM      Failed - Last BP in normal range    BP Readings from Last 1 Encounters:  11/08/23 (!) 156/64         Passed - Cr in normal range and within 180 days    Creat  Date Value Ref Range Status  08/18/2016 0.64 0.50 - 1.05 mg/dL Final    Comment:      For patients > or = 64 years of age: The upper reference limit for Creatinine is approximately 13% higher for people identified as African-American.      Creatinine, Ser  Date Value Ref Range Status  10/31/2023 0.99 0.57 - 1.00 mg/dL Final   Creatinine, POC  Date Value Ref Range Status  04/26/2017 50 mg/dL Final   Creatinine, Urine  Date Value Ref Range Status  12/10/2014 38.8 mg/dL Final    Comment:    No reference range established.         Passed - K in normal range and within 180 days    Potassium  Date Value Ref Range Status  10/31/2023 4.0 3.5 - 5.2 mmol/L Final         Passed - Patient is not pregnant      Passed - Valid encounter within last 6 months    Recent Outpatient Visits           1 month ago Type 2 diabetes mellitus with diabetic neuropathy, without long-term current use of insulin (HCC)   [place] [facility] Dept Of [place]. [facility] Jonah Blue B, MD   10 months ago Type 2 diabetes mellitus with diabetic neuropathy, without long-term current use of insulin (HCC)   [place] [facility] Dept Of [place]. [facility] Jonah Blue B, MD   1 year ago Type 2 diabetes mellitus with diabetic neuropathy, without long-term current use of insulin  (HCC)   [place] [facility] - A Dept Of [place]. [facility] Jonah Blue B, MD   1 year ago Type 2 diabetes mellitus with diabetic neuropathy, without long-term current use of insulin (HCC)   [place] [facility] - A Dept Of [place]. [facility] Jonah Blue B, MD   2 years ago Type 2 diabetes mellitus with diabetic neuropathy, without long-term current use of insulin (HCC)   [place] [facility] [place] - A Dept Of [place]. [facility] Marcine Matar, MD       Future Appointments             In 1 month Laural Benes Binnie Rail, MD [facility] [place] - A Dept Of [place] Bridegroom. [facility]   In 2 months Tenny Craw, Sherol Dade, MD [facility]   In 2 months Laural Benes, Binnie Rail, MD [facility] [place] - A Dept Of [place] Brideg[place]. [facility]

## 2024-01-12 ENCOUNTER — Other Ambulatory Visit: Payer: Self-pay | Admitting: Internal Medicine

## 2024-01-12 DIAGNOSIS — I10 Essential (primary) hypertension: Secondary | ICD-10-CM

## 2024-01-16 ENCOUNTER — Ambulatory Visit: Payer: Medicaid Other | Admitting: Internal Medicine

## 2024-01-18 LAB — HM DIABETES EYE EXAM

## 2024-01-30 ENCOUNTER — Other Ambulatory Visit: Payer: Self-pay | Admitting: Internal Medicine

## 2024-01-30 DIAGNOSIS — Z1231 Encounter for screening mammogram for malignant neoplasm of breast: Secondary | ICD-10-CM

## 2024-02-05 ENCOUNTER — Other Ambulatory Visit: Payer: Self-pay | Admitting: Internal Medicine

## 2024-02-05 DIAGNOSIS — I10 Essential (primary) hypertension: Secondary | ICD-10-CM

## 2024-02-06 DIAGNOSIS — N3001 Acute cystitis with hematuria: Secondary | ICD-10-CM | POA: Diagnosis not present

## 2024-02-12 ENCOUNTER — Ambulatory Visit: Admitting: Nurse Practitioner

## 2024-02-18 ENCOUNTER — Other Ambulatory Visit: Payer: Self-pay | Admitting: Internal Medicine

## 2024-02-18 DIAGNOSIS — J302 Other seasonal allergic rhinitis: Secondary | ICD-10-CM

## 2024-02-19 ENCOUNTER — Other Ambulatory Visit: Payer: Self-pay | Admitting: Internal Medicine

## 2024-02-19 ENCOUNTER — Ambulatory Visit: Payer: Medicaid Other | Admitting: Internal Medicine

## 2024-02-19 DIAGNOSIS — I10 Essential (primary) hypertension: Secondary | ICD-10-CM

## 2024-02-24 ENCOUNTER — Other Ambulatory Visit: Payer: Self-pay | Admitting: Internal Medicine

## 2024-02-24 DIAGNOSIS — E118 Type 2 diabetes mellitus with unspecified complications: Secondary | ICD-10-CM

## 2024-02-26 NOTE — Telephone Encounter (Signed)
 Requested Prescriptions  Pending Prescriptions Disp Refills   metFORMIN (GLUCOPHAGE) 1000 MG tablet [Pharmacy Med Name: METFORMIN HCL 1,000 MG TABLET] 180 tablet 0    Sig: TAKE 1 TABLET BY MOUTH TWICE A DAY WITH FOOD     Endocrinology:  Diabetes - Biguanides Failed - 02/26/2024  3:05 PM      Failed - CBC within normal limits and completed in the last 12 months    WBC  Date Value Ref Range Status  10/31/2023 5.5 3.4 - 10.8 x10E3/uL Final  12/22/2022 4.3 4.0 - 10.5 K/uL Final   RBC  Date Value Ref Range Status  10/31/2023 3.85 3.77 - 5.28 x10E6/uL Final  12/22/2022 3.71 (L) 3.87 - 5.11 MIL/uL Final   Hemoglobin  Date Value Ref Range Status  10/31/2023 12.7 11.1 - 15.9 g/dL Final   Hematocrit  Date Value Ref Range Status  10/31/2023 38.5 34.0 - 46.6 % Final   MCHC  Date Value Ref Range Status  10/31/2023 33.0 31.5 - 35.7 g/dL Final  16/08/9603 54.0 30.0 - 36.0 g/dL Final   [facility]  Date Value Ref Range Status  10/31/2023 33.0 26.6 - 33.0 pg Final  12/22/2022 31.8 26.0 - 34.0 pg Final   MCV  Date Value Ref Range Status  10/31/2023 100 (H) 79 - 97 fL Final   No results found for: "PLTCOUNTKUC", "LABPLAT", "POCPLA" RDW  Date Value Ref Range Status  10/31/2023 13.1 11.7 - 15.4 % Final         Passed - Cr in normal range and within 360 days    Creat  Date Value Ref Range Status  08/18/2016 0.64 0.50 - 1.05 mg/dL Final    Comment:      For patients > or = 64 years of age: The upper reference limit for Creatinine is approximately 13% higher for people identified as African-American.      Creatinine, Ser  Date Value Ref Range Status  10/31/2023 0.99 0.57 - 1.00 mg/dL Final   Creatinine, POC  Date Value Ref Range Status  04/26/2017 50 mg/dL Final   Creatinine, Urine  Date Value Ref Range Status  12/10/2014 38.8 mg/dL Final    Comment:    No reference range established.         Passed - HBA1C is between 0 and 7.9 and within 180 days    HbA1c, POC (prediabetic  range)  Date Value Ref Range Status  03/31/2022 6.2 5.7 - 6.4 % Final   HbA1c, POC (controlled diabetic range)  Date Value Ref Range Status  10/31/2023 6.2 0.0 - 7.0 % Final         Passed - eGFR in normal range and within 360 days    GFR, Est African American  Date Value Ref Range Status  08/18/2016 >89 >=60 mL/min Final   GFR calc Af Amer  Date Value Ref Range Status  11/09/2020 94 >59 mL/min/1.73 Final    Comment:    **In accordance with recommendations from the NKF-ASN Task force,**   [facility] is in the process of updating its eGFR calculation to the   2021 CKD-EPI creatinine equation that estimates kidney function   without a race variable.    GFR, Est Non African American  Date Value Ref Range Status  08/18/2016 >89 >=60 mL/min Final   GFR, Estimated  Date Value Ref Range Status  12/22/2022 >60 >60 mL/min Final    Comment:    (NOTE) Calculated using the CKD-EPI Creatinine Equation (2021)    GFR  Date Value Ref Range Status  10/30/2013 120.29 >60.00 mL/min Final   eGFR  Date Value Ref Range Status  10/31/2023 64 >59 mL/min/1.73 Final         Passed - B12 Level in normal range and within 720 days    Vitamin B-12  Date Value Ref Range Status  05/13/2022 584 232 - 1,245 pg/mL Final         Passed - Valid encounter within last 6 months    Recent Outpatient Visits           3 months ago Type 2 diabetes mellitus with diabetic neuropathy, without long-term current use of insulin (HCC)   [place] [facility] Dept Of [place]. [facility] Jonah Blue B, MD   1 year ago Type 2 diabetes mellitus with diabetic neuropathy, without long-term current use of insulin (HCC)   [place] [facility] A Dept Of [place]. [facility] Jonah Blue B, MD   1 year ago Type 2 diabetes mellitus with diabetic neuropathy, without long-term current use of insulin (HCC)   [place] [facility] - A Dept Of [place]. [facility] Jonah Blue B, MD   1 year ago Type 2 diabetes mellitus with diabetic neuropathy, without long-term current use of insulin (HCC)   [place] [facility] - A Dept Of [place]. [facility] Jonah Blue B, MD   2 years ago Type 2 diabetes mellitus with diabetic neuropathy, without long-term current use of insulin (HCC)    [facility] [place] - A Dept Of [place]. [facility] Marcine Matar, MD       Future Appointments             In 1 week Marcine Matar, MD [facility] [place] - A Dept Of [place]igh[place] Bridegroom. [facility]

## 2024-02-29 ENCOUNTER — Ambulatory Visit
Admission: RE | Admit: 2024-02-29 | Discharge: 2024-02-29 | Disposition: A | Discharge status: Home / Self Care | Source: Ambulatory Visit | Attending: Internal Medicine | Admitting: Internal Medicine

## 2024-02-29 DIAGNOSIS — Z1231 Encounter for screening mammogram for malignant neoplasm of breast: Secondary | ICD-10-CM | POA: Diagnosis not present

## 2024-03-04 ENCOUNTER — Ambulatory Visit: Payer: Medicaid Other | Attending: Internal Medicine | Admitting: Internal Medicine

## 2024-03-04 VITALS — BP 121/75 | HR 57 | Ht 67.0 in | Wt 189.0 lb

## 2024-03-04 DIAGNOSIS — I10 Essential (primary) hypertension: Secondary | ICD-10-CM

## 2024-03-04 DIAGNOSIS — E1159 Type 2 diabetes mellitus with other circulatory complications: Secondary | ICD-10-CM

## 2024-03-04 DIAGNOSIS — Z7984 Long term (current) use of oral hypoglycemic drugs: Secondary | ICD-10-CM | POA: Diagnosis not present

## 2024-03-04 DIAGNOSIS — N950 Postmenopausal bleeding: Secondary | ICD-10-CM | POA: Diagnosis not present

## 2024-03-04 DIAGNOSIS — I480 Paroxysmal atrial fibrillation: Secondary | ICD-10-CM | POA: Diagnosis not present

## 2024-03-04 DIAGNOSIS — E119 Type 2 diabetes mellitus without complications: Secondary | ICD-10-CM | POA: Diagnosis not present

## 2024-03-04 DIAGNOSIS — E785 Hyperlipidemia, unspecified: Secondary | ICD-10-CM | POA: Diagnosis not present

## 2024-03-04 DIAGNOSIS — E1169 Type 2 diabetes mellitus with other specified complication: Secondary | ICD-10-CM

## 2024-03-04 DIAGNOSIS — L989 Disorder of the skin and subcutaneous tissue, unspecified: Secondary | ICD-10-CM | POA: Diagnosis not present

## 2024-03-04 LAB — POCT GLYCOSYLATED HEMOGLOBIN (HGB A1C): HbA1c, POC (controlled diabetic range): 6.2 % (ref 0.0–7.0)

## 2024-03-04 LAB — GLUCOSE, POCT (MANUAL RESULT ENTRY): POC Glucose: 135 mg/dL — AB (ref 70–99)

## 2024-03-04 NOTE — Patient Instructions (Signed)
 VISIT SUMMARY:  During your visit, we discussed your recent vaginal spotting, a persistent lesion on your right index finger, and reviewed your ongoing management for diabetes, hypertension, atrial fibrillation, and hyperlipidemia. We have made some referrals and adjustments to ensure comprehensive care.  YOUR PLAN:  -POSTMENOPAUSAL VAGINAL BLEEDING: Postmenopausal bleeding is any vaginal bleeding that occurs after menopause and needs to be evaluated to rule out any serious conditions. We will refer you to a gynecologist for further evaluation and have ordered a pelvic ultrasound to check for any abnormalities in your uterus.  -RIGHT INDEX FINGER LESION: The persistent hard lesion on your right index finger needs to be evaluated by a dermatologist to determine the cause and appropriate treatment. We will refer you to a dermatologist for further evaluation.  -TYPE 2 DIABETES MELLITUS: Your diabetes is well-controlled with your current medication, metformin, and your A1c level is 6.2%. Continue taking metformin twice daily, maintain your healthy eating habits, and regularly monitor your blood sugar levels.  -HYPERTENSION: Your blood pressure is well-controlled with your current medications. Continue taking carvedilol 3.125 mg twice daily and losartan 25 mg daily, and keep following a low-salt diet.  -ATRIAL FIBRILLATION: Atrial fibrillation is an irregular and often rapid heart rate. You are managing it well with Pradaxa. Continue taking Pradaxa as prescribed.  -HYPERLIPIDEMIA: Hyperlipidemia is having high levels of fats in your blood. You are currently taking fenofibrate for this condition. [facility]ill [facility]  INSTRUCTIONS:  Please follow up with the gynecologist and dermatologist as referred. Complete the pelvic ultrasound as ordered. Continue with your current medications and lifestyle habits. If you have any new symptoms or concerns, please contact  our office.

## 2024-03-04 NOTE — Progress Notes (Unsigned)
 Patient ID: Jane Arellano, female    DOB: February 20, 1961  MRN: [id number]  CC: Diabetes (DM f/u. Nicki Reaper that Clyndamycin caused emotional distress/Reports that hydroxyzine is causing nightmares/Wart on finger of R hand X4-5 mo)   Subjective: Jane Arellano is a 63 y.o. female who presents for chronic ds management. 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  History of Present Illness     Patient Active Problem List   Diagnosis Date Noted   Hypercoagulable state due to paroxysmal atrial fibrillation (HCC) 11/30/2021   Dyslipidemia 10/25/2017   Pap smear for cervical cancer screening 08/18/2016   Diabetic polyneuropathy associated with type 2 diabetes mellitus (HCC) 01/14/2016   Diabetic eye exam (HCC) 01/14/2016   Screening for colorectal cancer 01/14/2016   Neck pain 06/29/2015   Type 2 diabetes mellitus with complication, without long-term current use of insulin (HCC) 03/16/2015   Essential hypertension 03/16/2015   BV (bacterial vaginosis) 12/11/2014   Screening for colon cancer 12/10/2014   Keloid scar 12/10/2014   Screening for STD (sexually transmitted disease) 12/10/2014   Onychomycosis of toenail 12/10/2014   Cerumen impaction 12/10/2014   Diabetes mellitus type 2 with neurological manifestations (HCC) 08/14/2014   Blurry vision, bilateral 08/14/2014   PAF (paroxysmal atrial fibrillation) (HCC) 05/08/2013   ETOH abuse 05/02/2013   Marijuana abuse 05/02/2013   Diastolic dysfunction 05/02/2013   Depression with anxiety 05/01/2013   Subaortic membrane    HTN (hypertension)    GERD 04/30/2007     Current Outpatient Medications on File Prior to Visit  Medication Sig Dispense Refill    Accu-Chek Softclix Lancets lancets Use to check blood sugar once daily. E11.40 100 each 2   Blood Glucose Calibration (ACCU-CHEK AVIVA) SOLN USE AS INSTRUCTED 1 each 0   Blood Glucose Monitoring Suppl (ACCU-CHEK GUIDE) w/Device KIT Use to check blood sugar once daily. E11.40 1 kit 0   Calcium Carb-Cholecalciferol (CALCIUM-VITAMIN D3) 500-400 MG-UNIT TABS Take 1 tablet by mouth daily. 60 tablet 5   carvedilol (COREG) 3.125 MG tablet TAKE 1 TABLET BY MOUTH TWICE A DAY 180 tablet 0   cetirizine (ZYRTEC) 10 MG tablet TAKE 1 TABLET (10 MG TOTAL) BY MOUTH DAILY AS NEEDED FOR ALLERGIES. AS NEEDED FOR NASAL CONGESTION 90 tablet 0   famotidine (PEPCID) 40 MG tablet TAKE 1 TABLET BY MOUTH EVERYDAY AT BEDTIME 90 tablet 0   fenofibrate (TRICOR) 145 MG tablet Take 1 tablet (145 mg total) by mouth daily. 90 tablet 1   ferrous sulfate 325 (65 FE) MG tablet TAKE 1 TABLET BY MOUTH THREE TIMES WEEKLY AFTER A MEAL 36 tablet 0   folic acid (FOLVITE) 800 MCG tablet Take 800 mcg by mouth daily.     furosemide (LASIX) 20 MG tablet TAKE 1 TABLET BY MOUTH EVERY DAY 90 tablet 0   gabapentin (NEURONTIN) 600 MG tablet TAKE 1 TABLET BY MOUTH THREE TIMES A DAY 90 tablet 6   glucose blood (ACCU-CHEK GUIDE) test strip Use to check blood sugar once daily. E11.40 100 each 2   losartan (COZAAR) 25 MG tablet TAKE 1 TABLET (25 MG TOTAL) BY MOUTH DAILY. PLEASE KEEP UPCOMING APPOINTMENT FOR ADDITIONAL REFILLS. 90 tablet 0   metFORMIN (GLUCOPHAGE) 1000 MG tablet  TAKE 1 TABLET BY MOUTH TWICE A DAY WITH FOOD 180 tablet 0   Multiple Vitamin (MULTIVITAMIN WITH MINERALS) TABS tablet Take 1 tablet by mouth daily.     Omega-3 Fatty Acids (FISH OIL) 1000 MG CPDR Take 3 g by mouth daily. (Patient taking differently: Take 2 capsules by mouth daily.) 90 capsule 6   omeprazole (PRILOSEC) 40 MG capsule Take 1 capsule (40 mg total) by mouth daily. 90 capsule 2   polyethylene glycol powder (GAVILAX) 17 GM/SCOOP powder MIX 1 CAPFUL WITH LIQUID AND DRINK  ONCE DAILY 476 g 6   PRADAXA 150 MG CAPS capsule TAKE 1 CAPSULE BY MOUTH TWICE A DAY 180 capsule 0   clindamycin (CLEOCIN) 2 % vaginal cream Place 1 Applicatorful vaginally at bedtime. (Patient not taking: Reported on 03/04/2024) 40 g 0   clindamycin (CLEOCIN) 300 MG capsule Take 1 capsule (300 mg total) by mouth 2 (two) times daily. (Patient not taking: Reported on 03/04/2024) 14 capsule 0   hydrOXYzine (ATARAX) 25 MG tablet Take 1 tablet (25 mg total) by mouth 3 (three) times daily as needed. (Patient not taking: Reported on 03/04/2024) 270 tablet 0   sucralfate (CARAFATE) 1 g tablet Take 1 tablet (1 g total) by mouth 4 (four) times daily -  with meals and at bedtime. You can swallow the pill or dissolve in water (Patient not taking: Reported on 03/04/2024) 90 tablet 0   [DISCONTINUED] potassium chloride (K-DUR,KLOR-CON) 10 MEQ tablet Take 1 tablet (10 mEq total) by mouth daily. 30 tablet 0   No current facility-administered medications on file prior to visit.    Allergies  Allergen Reactions   Xarelto [Rivaroxaban] Swelling    Gums bled, headaches   Amoxicillin    Atorvastatin Other (See Comments)    Pain in flank and arms, dizziness.   Ibuprofen    Ketorolac Tromethamine Other (See Comments)    Unknown reaction    Social History   Socioeconomic History   Marital status: Single    Spouse name: Not on file   Number of children: Not on file   Years of education: Not on file   Highest education level: Not on file  Occupational History   Not on file  Tobacco Use   Smoking status: Never   Smokeless tobacco: Current    Types: Snuff   Tobacco comments:    Currently snuff 02/13/23  Vaping Use   Vaping status: Never Used  Substance and Sexual Activity   Alcohol use: Not Currently    Alcohol/week: 2.0 - 3.0 standard drinks of alcohol    Types: 2 - 3 Glasses of wine per week   Drug use: No    Types: Marijuana    Comment: Smokes marijuana weekly.  Has not used cocaine in 9 years.    Sexual activity: Yes    Birth control/protection: None  Other Topics Concern   Not on file  Social History Narrative   Lives in [place] by herself.  She had been caring for her mother but she died 2 mos ago.  She tries to remain active but does not regularly exercise.   Social Drivers of Corporate investment banker Strain: Low Risk  (10/31/2023)   Overall Financial Resource Strain (CARDIA)    Difficulty of Paying Living Expenses: Not very hard  Food Insecurity: Food Insecurity Present (10/31/2023)   Hunger Vital Sign    Worried About Running Out of Food in the Last Year: Sometimes true    Ran Out of  Food in the Last Year: Sometimes true  Transportation Needs: No Transportation Needs (10/31/2023)   PRAPARE - Administrator, Civil Service (Medical): No    Lack of Transportation (Non-Medical): No  Physical Activity: Insufficiently Active (10/31/2023)   Exercise Vital Sign    Days of Exercise per Week: 3 days    Minutes of Exercise per Session: 20 min  Stress: Stress Concern Present (10/31/2023)   [employer] of Occupational Health - Occupational Stress Questionnaire    Feeling of Stress : Rather much  Social Connections: Moderately Integrated (10/31/2023)   Social Connection and Isolation Panel [NHANES]    Frequency of Communication with Friends and Family: More than three times a week    Frequency of Social Gatherings with Friends and Family: Once a week    Attends Religious Services: More than 4 times per year    Active Member of Clubs or Organizations: Yes    Attends Banker Meetings: More than 4 times per year    Marital Status: Never married  Intimate Partner Violence: Not At Risk (10/31/2023)   Humiliation, Afraid, Rape, and Kick questionnaire    Fear of Current or Ex-Partner: No    Emotionally Abused: No    Physically Abused: No    Sexually Abused: No    Family History  Problem Relation Age of Onset   Diabetes Father        deceased    Cancer Mother 21       colon   Stroke Mother    Hypertension Sister        alive and well   Hypertension Sister        alive and well   Heart attack Brother        deceased @ 53    Past Surgical History:  Procedure Laterality Date   CARDIOVERSION N/A 11/04/2013   Procedure: CARDIOVERSION;  Surgeon: Peter M Swaziland, MD;  Location: [facility] ENDOSCOPY;  Service: Cardiovascular;  Laterality: N/A;   CESAREAN SECTION      ROS: Review of Systems Negative except as stated above  PHYSICAL EXAM: BP 121/75 (BP Location: Left Arm, Patient Position: Sitting, Cuff Size: Normal)   Pulse (!) 57   Ht 5\' 7"  (1.702 m)   Wt 189 lb (85.7 kg)   SpO2 (!) 9%   BMI 29.60 kg/m   Wt Readings from Last 3 Encounters:  03/04/24 189 lb (85.7 kg)  11/08/23 184 lb 15.5 oz (83.9 kg)  10/31/23 185 lb (83.9 kg)    Physical Exam  {female adult master:310786} {female adult master:310785}     Latest Ref Rng & Units 10/31/2023    4:53 PM 12/22/2022   11:25 AM 08/10/2022    1:32 PM  CMP  Glucose 70 - 99 mg/dL 95  161  95   BUN 8 - 27 mg/dL 14  12  9    Creatinine 0.57 - 1.00 mg/dL 0.96  0.45  4.09   Sodium 134 - 144 mmol/L 143  136  137   Potassium 3.5 - 5.2 mmol/L 4.0  3.9  3.7   Chloride 96 - 106 mmol/L 103  100  103   CO2 20 - 29 mmol/L 25  26  22    Calcium 8.7 - 10.3 mg/dL 81.1  91.4  78.2   Total Protein 6.0 - 8.5 g/dL 7.9     Total Bilirubin 0.0 - 1.2 mg/dL 0.3     Alkaline Phos 44 - 121 IU/L 40  AST 0 - 40 IU/L 12     ALT 0 - 32 IU/L 10      Lipid Panel     Component Value Date/Time   CHOL 143 10/31/2023 1653   TRIG 105 10/31/2023 1653   HDL 58 10/31/2023 1653   CHOLHDL 2.5 10/31/2023 1653   CHOLHDL 2.9 09/12/2016 0939   VLDL NOT CALC 09/12/2016 0939   LDLCALC 66 10/31/2023 1653    CBC    Component Value Date/Time   WBC 5.5 10/31/2023 1653   WBC 4.3 12/22/2022 1125   RBC 3.85 10/31/2023 1653   RBC 3.71 (L) 12/22/2022 1125   HGB 12.7 10/31/2023 1653   HCT 38.5 10/31/2023 1653    PLT 196 10/31/2023 1653   MCV 100 (H) 10/31/2023 1653   MCH 33.0 10/31/2023 1653   MCH 31.8 12/22/2022 1125   MCHC 33.0 10/31/2023 1653   MCHC 33.8 12/22/2022 1125   RDW 13.1 10/31/2023 1653   LYMPHSABS 1.8 05/17/2022 1201   MONOABS 246 08/18/2016 1114   EOSABS 0.1 05/17/2022 1201   BASOSABS 0.0 05/17/2022 1201    ASSESSMENT AND PLAN:  Assessment and Plan Assessment & Plan      1. Diabetes mellitus treated with oral medication (HCC) (Primary) *** - POCT glycosylated hemoglobin (Hb A1C) - POCT glucose (manual entry)    Patient was given the opportunity to ask questions.  Patient verbalized understanding of the plan and was able to repeat key elements of the plan.   This documentation was completed using Paediatric nurse.  Any transcriptional errors are unintentional.  Orders Placed This Encounter  Procedures   POCT glycosylated hemoglobin (Hb A1C)   POCT glucose (manual entry)     Requested Prescriptions    No prescriptions requested or ordered in this encounter    No follow-ups on file.  Concetta Dee, MD, FACP

## 2024-03-05 ENCOUNTER — Telehealth: Payer: Self-pay | Admitting: Internal Medicine

## 2024-03-05 NOTE — Telephone Encounter (Signed)
 Please advice.   Copied from CRM [id number]. Topic: General - Other >> Mar 05, 2024  4:04 PM Felizardo Hotter wrote:  Reason for CRM: Pt would like a call back regarding mammogram results. Please call pt at  [phone] or via MyChart.

## 2024-03-06 ENCOUNTER — Encounter: Payer: Self-pay | Admitting: Internal Medicine

## 2024-03-06 ENCOUNTER — Other Ambulatory Visit: Payer: Self-pay | Admitting: Internal Medicine

## 2024-03-06 DIAGNOSIS — R928 Other abnormal and inconclusive findings on diagnostic imaging of breast: Secondary | ICD-10-CM

## 2024-03-06 LAB — MICROALBUMIN / CREATININE URINE RATIO
Creatinine, Urine: 22.7 mg/dL
Microalb/Creat Ratio: <13 mg/g{creat} (ref 0–29)
Microalbumin, Urine: <3 ug/mL

## 2024-03-13 ENCOUNTER — Other Ambulatory Visit: Payer: Self-pay | Admitting: Internal Medicine

## 2024-03-13 ENCOUNTER — Ambulatory Visit
Admission: RE | Admit: 2024-03-13 | Discharge: 2024-03-13 | Discharge status: Home / Self Care | Source: Ambulatory Visit | Attending: Internal Medicine | Admitting: Internal Medicine

## 2024-03-13 DIAGNOSIS — N950 Postmenopausal bleeding: Secondary | ICD-10-CM | POA: Diagnosis not present

## 2024-03-13 DIAGNOSIS — E1159 Type 2 diabetes mellitus with other circulatory complications: Secondary | ICD-10-CM

## 2024-03-18 ENCOUNTER — Ambulatory Visit

## 2024-03-18 ENCOUNTER — Ambulatory Visit
Admission: RE | Admit: 2024-03-18 | Discharge: 2024-03-18 | Disposition: A | Discharge status: Home / Self Care | Source: Ambulatory Visit | Attending: Internal Medicine | Admitting: Internal Medicine

## 2024-03-18 DIAGNOSIS — R928 Other abnormal and inconclusive findings on diagnostic imaging of breast: Secondary | ICD-10-CM | POA: Diagnosis not present

## 2024-03-19 ENCOUNTER — Encounter: Payer: Self-pay | Admitting: Internal Medicine

## 2024-03-20 ENCOUNTER — Encounter: Payer: Self-pay | Admitting: Internal Medicine

## 2024-04-08 ENCOUNTER — Encounter: Admitting: Obstetrics and Gynecology

## 2024-04-11 ENCOUNTER — Other Ambulatory Visit: Payer: Self-pay | Admitting: Internal Medicine

## 2024-04-16 ENCOUNTER — Other Ambulatory Visit: Payer: Self-pay | Admitting: Internal Medicine

## 2024-04-16 DIAGNOSIS — I10 Essential (primary) hypertension: Secondary | ICD-10-CM

## 2024-04-18 ENCOUNTER — Encounter: Payer: Self-pay | Admitting: Physician Assistant

## 2024-04-18 ENCOUNTER — Ambulatory Visit: Admitting: Physician Assistant

## 2024-04-18 ENCOUNTER — Other Ambulatory Visit (HOSPITAL_COMMUNITY)
Admission: RE | Admit: 2024-04-18 | Discharge: 2024-04-18 | Disposition: A | Discharge status: Home / Self Care | Source: Ambulatory Visit | Attending: Physician Assistant | Admitting: Physician Assistant

## 2024-04-18 VITALS — BP 132/75 | HR 58 | Ht 67.0 in | Wt 189.2 lb

## 2024-04-18 DIAGNOSIS — Z1211 Encounter for screening for malignant neoplasm of colon: Secondary | ICD-10-CM | POA: Diagnosis not present

## 2024-04-18 DIAGNOSIS — Z1212 Encounter for screening for malignant neoplasm of rectum: Secondary | ICD-10-CM

## 2024-04-18 DIAGNOSIS — N950 Postmenopausal bleeding: Secondary | ICD-10-CM | POA: Insufficient documentation

## 2024-04-18 DIAGNOSIS — Z1331 Encounter for screening for depression: Secondary | ICD-10-CM

## 2024-04-18 DIAGNOSIS — Z124 Encounter for screening for malignant neoplasm of cervix: Secondary | ICD-10-CM | POA: Diagnosis not present

## 2024-04-18 NOTE — Progress Notes (Signed)
 Pt presents for postmenopausal vaginal bleeding. Pt states that she spots every now and then. Had an u/s done 03/13/24 that was normal.

## 2024-04-18 NOTE — Progress Notes (Signed)
 GYNECOLOGY  VISIT   HPI: Jane Arellano is a 63 y.o.   Single  African American  female   G2P0 with hx T2DM, here for postmenopausal bleeding with normal TVUS 03/13/24. Patient reports bleeding started in March and is spotting only without clotting. She denies any known precipitating factors such as trauma; she is sexually active. She takes antithrombotic, Pradaxa ; no herbal supplements. Accompanying abdominal pain that she feels started at same time as bleeding. Her mother died of colon cancer at age 93, and no known history breast, ovarian, endometrial cancer.   She denies fever, n/v, pelvic pain, abnormal vaginal discharge, dysuria, urinary frequency/urgency, dark stools, bloody stools  The pregnancy intention screening data noted above was reviewed. Potential methods of contraception were discussed. The patient elected to proceed with No data recorded.   GYNECOLOGIC HISTORY: No LMP recorded. Patient is postmenopausal. Menopausal hormone therapy:  None Last mammogram: 02/29/24, normal.  Last pap smear:  Diagnosis  Date Value Ref Range Status  04/13/2020   Final   - Negative for intraepithelial lesion or malignancy (NILM)           OB History     Gravida  2   Para      Term      Preterm      AB      Living  2      SAB      IAB      Ectopic      Multiple      Live Births                 Patient Active Problem List   Diagnosis Date Noted   Hypercoagulable state due to paroxysmal atrial fibrillation (HCC) 11/30/2021   Dyslipidemia 10/25/2017   Pap smear for cervical cancer screening 08/18/2016   Diabetic polyneuropathy associated with type 2 diabetes mellitus (HCC) 01/14/2016   Diabetic eye exam (HCC) 01/14/2016   Screening for colorectal cancer 01/14/2016   Neck pain 06/29/2015   Type 2 diabetes mellitus with complication, without long-term current use of insulin  (HCC) 03/16/2015   Essential hypertension 03/16/2015   BV (bacterial vaginosis) 12/11/2014    Screening for colon cancer 12/10/2014   Keloid scar 12/10/2014   Screening for STD (sexually transmitted disease) 12/10/2014   Onychomycosis of toenail 12/10/2014   Cerumen impaction 12/10/2014   Diabetes mellitus type 2 with neurological manifestations (HCC) 08/14/2014   Blurry vision, bilateral 08/14/2014   PAF (paroxysmal atrial fibrillation) (HCC) 05/08/2013   ETOH abuse 05/02/2013   Marijuana abuse 05/02/2013   Diastolic dysfunction 05/02/2013   Depression with anxiety 05/01/2013   Subaortic membrane    HTN (hypertension)    GERD 04/30/2007    Past Medical History:  Diagnosis Date   Atypical chest pain    a. 11/2005 Negative Myoview   Diastolic dysfunction    DM2 (diabetes mellitus, type 2) (HCC)    Dysrhythmia    AFIB HX CV    GERD (gastroesophageal reflux disease)    Heart murmur    History of substance abuse (HCC)    HTN (hypertension)    Iron deficiency anemia    Paroxysmal atrial fibrillation (HCC) 05/01/2013   On Xarelto    Subaortic membrane    a. 01/2010 Echo: EF 55-60%, No rwma, subaortic membrane with elevated LVOT mean gradient of 21 mmHg, Triv AI, mod dil LA, mildly increased PASP. b. 05/02/2013 Echo:  LVEF 60-65%, grade 1 diastolic dysfunction, mild LVH, subaortic stenosis w/ turbulation in LVOT  c/w subaortic membrane (mean gradient 42 mmHg/peak gradient 81 mmHg), mild biatrial enlargement    Past Surgical History:  Procedure Laterality Date   CARDIOVERSION N/A 11/04/2013   Procedure: CARDIOVERSION;  Surgeon: Peter M Swaziland, MD;  Location: [facility] ENDOSCOPY;  Service: Cardiovascular;  Laterality: N/A;   CESAREAN SECTION      Current Outpatient Medications  Medication Sig Dispense Refill   Accu-Chek Softclix Lancets lancets Use to check blood sugar once daily. E11.40 100 each 2   Blood Glucose Calibration (ACCU-CHEK AVIVA) SOLN USE AS INSTRUCTED 1 each 0   Blood Glucose Monitoring Suppl (ACCU-CHEK GUIDE) w/Device KIT Use to check blood sugar once daily. E11.40 1  kit 0   Calcium  Carb-Cholecalciferol (CALCIUM -VITAMIN D3) 500-400 MG-UNIT TABS Take 1 tablet by mouth daily. 60 tablet 5   carvedilol  (COREG ) 3.125 MG tablet TAKE 1 TABLET BY MOUTH TWICE A DAY 180 tablet 0   cetirizine  (ZYRTEC ) 10 MG tablet TAKE 1 TABLET (10 MG TOTAL) BY MOUTH DAILY AS NEEDED FOR ALLERGIES. AS NEEDED FOR NASAL CONGESTION 90 tablet 0   clindamycin  (CLEOCIN ) 2 % vaginal cream Place 1 Applicatorful vaginally at bedtime. 40 g 0   famotidine  (PEPCID ) 40 MG tablet TAKE 1 TABLET BY MOUTH EVERYDAY AT BEDTIME 90 tablet 1   fenofibrate  (TRICOR ) 145 MG tablet Take 1 tablet (145 mg total) by mouth daily. 90 tablet 1   ferrous sulfate  325 (65 FE) MG tablet TAKE 1 TABLET BY MOUTH THREE TIMES WEEKLY AFTER A MEAL 36 tablet 0   folic acid  (FOLVITE ) 800 MCG tablet Take 800 mcg by mouth daily.     furosemide  (LASIX ) 20 MG tablet TAKE 1 TABLET BY MOUTH EVERY DAY 90 tablet 0   gabapentin  (NEURONTIN ) 600 MG tablet TAKE 1 TABLET BY MOUTH THREE TIMES A DAY 90 tablet 6   glucose blood (ACCU-CHEK GUIDE) test strip Use to check blood sugar once daily. E11.40 100 each 2   losartan  (COZAAR ) 25 MG tablet Take 1 tablet (25 mg total) by mouth daily. 90 tablet 1   metFORMIN  (GLUCOPHAGE ) 1000 MG tablet TAKE 1 TABLET BY MOUTH TWICE A DAY WITH FOOD 180 tablet 0   Multiple Vitamin (MULTIVITAMIN WITH MINERALS) TABS tablet Take 1 tablet by mouth daily.     omeprazole  (PRILOSEC) 40 MG capsule TAKE 1 CAPSULE (40 MG TOTAL) BY MOUTH DAILY. 90 capsule 1   polyethylene glycol powder (GAVILAX) 17 GM/SCOOP powder MIX 1 CAPFUL WITH LIQUID AND DRINK ONCE DAILY 476 g 6   PRADAXA  150 MG CAPS capsule TAKE 1 CAPSULE BY MOUTH TWICE A DAY 180 capsule 0   hydrOXYzine  (ATARAX ) 25 MG tablet Take 1 tablet (25 mg total) by mouth 3 (three) times daily as needed. (Patient not taking: Reported on 04/18/2024) 270 tablet 0   Omega-3 Fatty Acids  (FISH OIL ) 1000 MG CPDR Take 3 g by mouth daily. (Patient taking differently: Take 2 capsules by mouth  daily.) 90 capsule 6   No current facility-administered medications for this visit.     ALLERGIES: Xarelto  [rivaroxaban ], Amoxicillin , Atorvastatin , Ibuprofen, and Ketorolac tromethamine  Family History  Problem Relation Age of Onset   Diabetes Father        deceased   Cancer Mother 39       colon   Stroke Mother    Hypertension Sister        alive and well   Hypertension Sister        alive and well   Heart attack Brother  deceased @ 28    Social History   Socioeconomic History   Marital status: Single    Spouse name: Not on file   Number of children: Not on file   Years of education: Not on file   Highest education level: Not on file  Occupational History   Not on file  Tobacco Use   Smoking status: Never   Smokeless tobacco: Current    Types: Snuff   Tobacco comments:    Currently snuff 02/13/23  Vaping Use   Vaping status: Never Used  Substance and Sexual Activity   Alcohol use: Not Currently    Alcohol/week: 2.0 - 3.0 standard drinks of alcohol    Types: 2 - 3 Glasses of wine per week   Drug use: Yes    Types: Marijuana    Comment: Smokes marijuana weekly.  Has not used cocaine in 9 years.   Sexual activity: Yes    Birth control/protection: None  Other Topics Concern   Not on file  Social History Narrative   Lives in [place] by herself.  She had been caring for her mother but she died 2 mos ago.  She tries to remain active but does not regularly exercise.   Social Drivers of Corporate investment banker Strain: Low Risk  (10/31/2023)   Overall Financial Resource Strain (CARDIA)    Difficulty of Paying Living Expenses: Not very hard  Food Insecurity: Food Insecurity Present (10/31/2023)   Hunger Vital Sign    Worried About Running Out of Food in the Last Year: Sometimes true    Ran Out of Food in the Last Year: Sometimes true  Transportation Needs: No Transportation Needs (10/31/2023)   PRAPARE - Administrator, Civil Service (Medical):  No    Lack of Transportation (Non-Medical): No  Physical Activity: Insufficiently Active (10/31/2023)   Exercise Vital Sign    Days of Exercise per Week: 3 days    Minutes of Exercise per Session: 20 min  Stress: Stress Concern Present (10/31/2023)   [employer] of Occupational Health - Occupational Stress Questionnaire    Feeling of Stress : Rather much  Social Connections: Moderately Integrated (10/31/2023)   Social Connection and Isolation Panel [NHANES]    Frequency of Communication with Friends and Family: More than three times a week    Frequency of Social Gatherings with Friends and Family: Once a week    Attends Religious Services: More than 4 times per year    Active Member of [employer] or Organizations: Yes    Attends Engineer, structural: More than 4 times per year    Marital Status: Never married  Intimate Partner Violence: Not At Risk (10/31/2023)   Humiliation, Afraid, Rape, and Kick questionnaire    Fear of Current or Ex-Partner: No    Emotionally Abused: No    Physically Abused: No    Sexually Abused: No    Review of Systems  PHYSICAL EXAMINATION:    BP 132/75   Pulse (!) 58   Ht 5\' 7"  (1.702 m)   Wt 189 lb 3.2 oz (85.8 kg)   BMI 29.63 kg/m     General appearance: alert, cooperative and appears stated age Head: Normocephalic, without obvious abnormality, atraumatic Neck: no adenopathy, supple, symmetrical, trachea midline and thyroid  normal to inspection and palpation Lungs: clear to auscultation bilaterally Heart: regular rate and rhythm Abdomen: soft, non-tender, no masses,  no organomegaly Extremities: extremities normal, atraumatic, no cyanosis or edema Skin: Skin color, texture,  turgor normal. No rashes or lesions Neurologic: Grossly normal  Pelvic: External genitalia:  no lesions          VULVA: Loss of labial and vulvar fullness. No masses, tenderness or lesions.  VAGINA: Moist, normal colored vaginal mucosa with thinning of vaginal  rugae. No lesions. CERVIX: External os with mild stenosis. Otherwise normal appearing cervix without discharge or lesions              Bimanual Exam:  Uterus:  normal size, contour, position, consistency, mobility, non-tender              Adnexa: no mass, fullness, tenderness  Chaperone was present for exam  Endometrial Biopsy Procedure  Patient identified, informed consent performed,  indication reviewed, consent signed.  Reviewed risk of perforation, pain, bleeding, insufficient sample, etc were reviewd. Time out was performed.  Urine pregnancy test negative.  Speculum placed in the vagina.  Cervix visualized.  Hurricane spray was used on cervix, waited 2-3 minutes to become effective. Cleaned cervix with Betadine x 2.  Anterior cervix grasped anteriorly with a single tooth tenaculum. The pipelle was passed 5.5 cm without difficulty and sample obtained.  Patient noted mild cramping. Tenaculum was removed, good hemostasis noted.  Patient tolerated procedure well. Tylenol  500 mg taken afterwards.   Patient was given post-procedure instructions.  ASSESSMENT & PLAN   1. Postmenopausal bleeding Spotting x 2 months, currently on antithrombotic, and sexually active with e/o vaginal atrophy on exam. No abnormalities noted on TVUS 03/13/24 to suggest structural issue (polyp, fibroid). No urinary sxs to suggest UTI w/ hematuria. No abnormal discharge, itching, irritation to suggest vaginitis/cervicitis. Hx of DM though A1C WNL. Patient reports no melena/hematochezia with negative cologard this year, 11/25/23. Plan pending EMB results, which patient tolerated well today.  - Cytology - PAP - Surgical pathology - Cervicovaginal ancillary only    An After Visit Summary was printed and given to the patient.  Tamme Mozingo E Kyro Joswick, PA-C 5/29/20251:46 PM

## 2024-04-22 LAB — CERVICOVAGINAL ANCILLARY ONLY
Chlamydia: NEGATIVE
Comment: NEGATIVE
Comment: NEGATIVE
Comment: NORMAL
Neisseria Gonorrhea: NEGATIVE
Trichomonas: POSITIVE — AB

## 2024-04-22 LAB — SURGICAL PATHOLOGY

## 2024-04-23 ENCOUNTER — Other Ambulatory Visit: Payer: Self-pay

## 2024-04-23 ENCOUNTER — Ambulatory Visit (HOSPITAL_COMMUNITY): Payer: Self-pay | Admitting: Physician Assistant

## 2024-04-23 DIAGNOSIS — A599 Trichomoniasis, unspecified: Secondary | ICD-10-CM

## 2024-04-23 MED ORDER — METRONIDAZOLE 500 MG PO TABS: 500.0000 mg | ORAL_TABLET | Freq: Two times a day (BID) | ORAL | 0 refills | Status: AC

## 2024-04-24 LAB — CYTOLOGY - PAP
Adequacy: ABSENT
Comment: NEGATIVE
Diagnosis: NEGATIVE
High risk HPV: NEGATIVE

## 2024-05-04 ENCOUNTER — Other Ambulatory Visit: Payer: Self-pay | Admitting: Internal Medicine

## 2024-05-04 DIAGNOSIS — I10 Essential (primary) hypertension: Secondary | ICD-10-CM

## 2024-05-13 ENCOUNTER — Other Ambulatory Visit: Payer: Self-pay | Admitting: Internal Medicine

## 2024-05-13 DIAGNOSIS — J302 Other seasonal allergic rhinitis: Secondary | ICD-10-CM

## 2024-05-20 ENCOUNTER — Other Ambulatory Visit: Payer: Self-pay | Admitting: Internal Medicine

## 2024-05-20 DIAGNOSIS — I10 Essential (primary) hypertension: Secondary | ICD-10-CM

## 2024-05-21 NOTE — Telephone Encounter (Signed)
 Requested Prescriptions  Pending Prescriptions Disp Refills   carvedilol  (COREG ) 3.125 MG tablet [Pharmacy Med Name: CARVEDILOL  3.125 MG TABLET] 180 tablet 1    Sig: TAKE 1 TABLET BY MOUTH TWICE A DAY     Cardiovascular: Beta Blockers 3 Passed - 05/21/2024  3:04 PM      Passed - Cr in normal range and within 360 days    Creat  Date Value Ref Range Status  08/18/2016 0.64 0.50 - 1.05 mg/dL Final    Comment:      For patients > or = 64 years of age: The upper reference limit for Creatinine is approximately 13% higher for people identified as African-American.      Creatinine, Ser  Date Value Ref Range Status  10/31/2023 0.99 0.57 - 1.00 mg/dL Final   Creatinine, POC  Date Value Ref Range Status  04/26/2017 50 mg/dL Final   Creatinine, Urine  Date Value Ref Range Status  12/10/2014 38.8 mg/dL Final    Comment:    No reference range established.         Passed - AST in normal range and within 360 days    AST  Date Value Ref Range Status  10/31/2023 12 0 - 40 IU/L Final         Passed - ALT in normal range and within 360 days    ALT  Date Value Ref Range Status  10/31/2023 10 0 - 32 IU/L Final         Passed - Last BP in normal range    BP Readings from Last 1 Encounters:  04/18/24 132/75         Passed - Last Heart Rate in normal range    Pulse Readings from Last 1 Encounters:  04/18/24 (!) 58         Passed - Valid encounter within last 6 months    Recent Outpatient Visits           2 months ago Diabetes mellitus treated with oral medication (HCC)   [facility] [place]. [facility] Vicci Sober B, MD   6 months ago Type 2 diabetes mellitus with diabetic neuropathy, without long-term current use of insulin  [facility])   [facility] [place]. [facility] Vicci Sober B, MD   1 year ago Type 2 diabetes mellitus with diabetic neuropathy, without long-term current use of  insulin  [facility])   [facility] Of [place]. [facility] Vicci Sober B, MD   1 year ago Type 2 diabetes mellitus with diabetic neuropathy, without long-term current use of insulin  [facility])   [facility] [place]. [facility] Vicci Sober B, MD   2 years ago Type 2 diabetes mellitus with diabetic neuropathy, without long-term current use of insulin  [facility])   [facility] Of [place]. [facility] Vicci Sober NOVAK, MD       Future Appointments             In 1 month Okey, Vina GAILS, MD [facility] Dept of [facility] [place]. [facility] [employer], [facility]

## 2024-05-22 ENCOUNTER — Ambulatory Visit: Admitting: Obstetrics & Gynecology

## 2024-05-31 ENCOUNTER — Other Ambulatory Visit: Payer: Self-pay | Admitting: Internal Medicine

## 2024-05-31 DIAGNOSIS — E785 Hyperlipidemia, unspecified: Secondary | ICD-10-CM

## 2024-06-04 ENCOUNTER — Other Ambulatory Visit: Payer: Self-pay | Admitting: Internal Medicine

## 2024-06-04 DIAGNOSIS — E118 Type 2 diabetes mellitus with unspecified complications: Secondary | ICD-10-CM

## 2024-06-17 ENCOUNTER — Encounter: Payer: Self-pay | Admitting: Obstetrics & Gynecology

## 2024-06-17 ENCOUNTER — Other Ambulatory Visit (HOSPITAL_COMMUNITY)
Admission: RE | Admit: 2024-06-17 | Discharge: 2024-06-17 | Disposition: A | Discharge status: Home / Self Care | Source: Ambulatory Visit | Attending: Obstetrics & Gynecology | Admitting: Obstetrics & Gynecology

## 2024-06-17 ENCOUNTER — Ambulatory Visit: Admitting: Obstetrics & Gynecology

## 2024-06-17 VITALS — BP 142/71 | HR 47 | Ht 67.0 in | Wt 189.0 lb

## 2024-06-17 DIAGNOSIS — N898 Other specified noninflammatory disorders of vagina: Secondary | ICD-10-CM | POA: Diagnosis not present

## 2024-06-17 NOTE — Progress Notes (Signed)
 The last blood sugar was 102. Apr 13, 2024 tested pos for trich. Treated and meds completed. Here for test of cure. Denies symptoms today.

## 2024-06-17 NOTE — Progress Notes (Signed)
 Patient was assessed and managed by nursing staff during this encounter. I have reviewed the chart and agree with the documentation and plan. I have also made any necessary editorial changes. She self swabbed for TOC and has no vaginal bleeding following treatment for trichomonas. US  showed normal endometrium.   Lynwood Solomons, MD  06/17/2024 5:02 PM

## 2024-06-18 LAB — CERVICOVAGINAL ANCILLARY ONLY
Chlamydia: NEGATIVE
Comment: NEGATIVE
Comment: NEGATIVE
Comment: NORMAL
Neisseria Gonorrhea: NEGATIVE
Trichomonas: NEGATIVE

## 2024-07-04 ENCOUNTER — Encounter: Payer: Self-pay | Admitting: Internal Medicine

## 2024-07-04 ENCOUNTER — Other Ambulatory Visit: Payer: Self-pay

## 2024-07-04 ENCOUNTER — Ambulatory Visit: Attending: Internal Medicine | Admitting: Internal Medicine

## 2024-07-04 VITALS — BP 127/75 | HR 56 | Ht 67.0 in | Wt 185.0 lb

## 2024-07-04 DIAGNOSIS — E1159 Type 2 diabetes mellitus with other circulatory complications: Secondary | ICD-10-CM

## 2024-07-04 DIAGNOSIS — F419 Anxiety disorder, unspecified: Secondary | ICD-10-CM

## 2024-07-04 DIAGNOSIS — G4700 Insomnia, unspecified: Secondary | ICD-10-CM

## 2024-07-04 DIAGNOSIS — E785 Hyperlipidemia, unspecified: Secondary | ICD-10-CM | POA: Diagnosis not present

## 2024-07-04 DIAGNOSIS — E119 Type 2 diabetes mellitus without complications: Secondary | ICD-10-CM

## 2024-07-04 DIAGNOSIS — Z7984 Long term (current) use of oral hypoglycemic drugs: Secondary | ICD-10-CM | POA: Diagnosis not present

## 2024-07-04 DIAGNOSIS — Z862 Personal history of diseases of the blood and blood-forming organs and certain disorders involving the immune mechanism: Secondary | ICD-10-CM

## 2024-07-04 DIAGNOSIS — E1169 Type 2 diabetes mellitus with other specified complication: Secondary | ICD-10-CM | POA: Diagnosis not present

## 2024-07-04 DIAGNOSIS — I152 Hypertension secondary to endocrine disorders: Secondary | ICD-10-CM

## 2024-07-04 LAB — POCT GLYCOSYLATED HEMOGLOBIN (HGB A1C): HbA1c, POC (controlled diabetic range): 6.5 % (ref 0.0–7.0)

## 2024-07-04 LAB — GLUCOSE, POCT (MANUAL RESULT ENTRY): POC Glucose: 98 mg/dL (ref 70–99)

## 2024-07-04 MED ORDER — TRAZODONE HCL 50 MG PO TABS
25.0000 mg | ORAL_TABLET | Freq: Every evening | ORAL | 3 refills | Status: AC | PRN
  Filled 2024-07-04: qty 30, 30d supply, fill #0

## 2024-07-04 NOTE — Progress Notes (Signed)
 Patient ID: Jane Arellano, female    DOB: [birth year]  MRN: [id number]  CC: Diabetes (DM f/u. Thompson iron pills/Discuss Hydroxyzine )   Subjective: Jane Arellano is a 63 y.o. female who presents for chronic ds management. 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  History of Present Illness Jane Arellano is a 63 year old female with diabetes, hypertension, hyperlipidemia, and atrial fibrillation who presents for a four-month follow-up visit.  DM: Results for orders placed or performed in visit on 07/04/24  POCT glycosylated hemoglobin (Hb A1C)   Collection Time: 07/04/24  4:13 PM  Result Value Ref Range   Hemoglobin A1C     HbA1c POC (<> result, manual entry)     HbA1c, POC (prediabetic range)     HbA1c, POC (controlled diabetic range) 6.5 0.0 - 7.0 %  POCT glucose (manual entry)   Collection Time: 07/04/24  4:13 PM  Result Value Ref Range   POC Glucose 98 70 - 99 mg/dl  Her recent J8r was 6.5 and her blood sugar was 98. She continues metformin  1000 mg twice daily, adheres to her medication regimen, maintains good eating habits, and engages in regular exercise, including mowing her large yard and walking her dogs.  HTN: She takes carvedilol  3.25 mg twice daily, Furosemide  20 mg and losartan  25 mg daily for blood pressure. She does not use salt in her food but lacks a device to check her blood pressure at home.  HL: She takes fenofibrate .  Intolerant of statins.  PAF: She takes Pradaxa  and requested a refill for this medication.  She has a history of anemia and was taken off iron pills over a year ago. Her last blood count in December was normal, and she is not currently on iron supplements  and wonders whether she should remain off the iron supplement..  She has been taking hydroxyzine  for anxiety but experiences adverse effects including dreams and headaches. She has not been taking amitriptyline  for over a year due to similar side effects. She mentions recent stress due to personal issues, including a breakup with her boyfriend after 38 years, and expresses difficulty sleeping and increased anxiety.  Requesting something to help her rest at nights. She recently moved to a new area and feels isolated because she does not know any one in the new community    Patient Active Problem List   Diagnosis Date Noted   Hypercoagulable state due to paroxysmal atrial fibrillation (HCC) 11/30/2021   Dyslipidemia 10/25/2017   Pap smear for cervical cancer screening 08/18/2016   Diabetic polyneuropathy associated with type 2 diabetes mellitus (HCC) 01/14/2016   Diabetic eye exam (HCC) 01/14/2016   Screening for colorectal cancer 01/14/2016   Neck pain 06/29/2015   Type 2 diabetes mellitus with complication, without long-term current use of insulin  (HCC) 03/16/2015   Essential hypertension 03/16/2015   BV (bacterial vaginosis) 12/11/2014   Screening for colon cancer 12/10/2014   Keloid scar 12/10/2014   Screening for STD (sexually transmitted disease) 12/10/2014   Onychomycosis of toenail 12/10/2014   Cerumen impaction 12/10/2014   Diabetes mellitus type 2 with neurological manifestations (HCC) 08/14/2014   Blurry vision, bilateral 08/14/2014   PAF (paroxysmal atrial fibrillation) (  HCC) 05/08/2013   ETOH abuse 05/02/2013   Marijuana abuse 05/02/2013   Diastolic dysfunction 05/02/2013   Depression with anxiety 05/01/2013   Subaortic membrane    HTN (hypertension)    GERD 04/30/2007     Current Outpatient Medications on File Prior to Visit  Medication Sig Dispense Refill   Accu-Chek Softclix Lancets lancets Use to check blood sugar once daily. E11.40 100 each 2   Blood Glucose  Calibration (ACCU-CHEK AVIVA) SOLN USE AS INSTRUCTED 1 each 0   Blood Glucose Monitoring Suppl (ACCU-CHEK GUIDE) w/Device KIT Use to check blood sugar once daily. E11.40 1 kit 0   Calcium  Carb-Cholecalciferol (CALCIUM -VITAMIN D3) 500-400 MG-UNIT TABS Take 1 tablet by mouth daily. 60 tablet 5   carvedilol  (COREG ) 3.125 MG tablet TAKE 1 TABLET BY MOUTH TWICE A DAY 180 tablet 1   cetirizine  (ZYRTEC ) 10 MG tablet TAKE 1 TABLET BY MOUTH DAILY AS NEEDED FOR ALLERGIES. AS NEEDED FOR NASAL CONGESTION 90 tablet 0   clindamycin  (CLEOCIN ) 2 % vaginal cream Place 1 Applicatorful vaginally at bedtime. 40 g 0   famotidine  (PEPCID ) 40 MG tablet TAKE 1 TABLET BY MOUTH EVERYDAY AT BEDTIME 90 tablet 1   fenofibrate  (TRICOR ) 145 MG tablet TAKE 1 TABLET BY MOUTH EVERY DAY 90 tablet 1   ferrous sulfate  325 (65 FE) MG tablet TAKE 1 TABLET BY MOUTH THREE TIMES WEEKLY AFTER A MEAL 36 tablet 0   folic acid  (FOLVITE ) 800 MCG tablet Take 800 mcg by mouth daily.     furosemide  (LASIX ) 20 MG tablet TAKE 1 TABLET BY MOUTH EVERY DAY 90 tablet 0   gabapentin  (NEURONTIN ) 600 MG tablet TAKE 1 TABLET BY MOUTH THREE TIMES A DAY 90 tablet 6   glucose blood (ACCU-CHEK GUIDE) test strip Use to check blood sugar once daily. E11.40 100 each 2   hydrOXYzine  (ATARAX ) 25 MG tablet Take 1 tablet (25 mg total) by mouth 3 (three) times daily as needed. 270 tablet 0   losartan  (COZAAR ) 25 MG tablet Take 1 tablet (25 mg total) by mouth daily. 90 tablet 1   metFORMIN  (GLUCOPHAGE ) 1000 MG tablet TAKE 1 TABLET BY MOUTH TWICE A DAY WITH FOOD 180 tablet 0   Multiple Vitamin (MULTIVITAMIN WITH MINERALS) TABS tablet Take 1 tablet by mouth daily.     Omega-3 Fatty Acids  (FISH OIL ) 1000 MG CPDR Take 3 g by mouth daily. (Patient taking differently: Take 2 capsules by mouth daily.) 90 capsule 6   omeprazole  (PRILOSEC) 40 MG capsule TAKE 1 CAPSULE (40 MG TOTAL) BY MOUTH DAILY. 90 capsule 1   polyethylene glycol powder (GAVILAX) 17 GM/SCOOP powder MIX 1  CAPFUL WITH LIQUID AND DRINK ONCE DAILY 476 g 6   PRADAXA  150 MG CAPS capsule TAKE 1 CAPSULE BY MOUTH TWICE A DAY 180 capsule 0   [DISCONTINUED] potassium chloride  (K-DUR,KLOR-CON ) 10 MEQ tablet Take 1 tablet (10 mEq total) by mouth daily. 30 tablet 0   No current facility-administered medications on file prior to visit.    Allergies  Allergen Reactions   Xarelto  [Rivaroxaban ] Swelling    Gums bled, headaches   Amoxicillin     Atorvastatin  Other (See Comments)    Pain in flank and arms, dizziness.   Ibuprofen    Ketorolac Tromethamine Other (See Comments)    Unknown reaction    Social History   Socioeconomic History   Marital status: Single    Spouse name: Not on file   Number of children: Not on file   Years of  education: Not on file   Highest education level: Not on file  Occupational History   Not on file  Tobacco Use   Smoking status: Never   Smokeless tobacco: Current    Types: Snuff   Tobacco comments:    Currently snuff 02/13/23  Vaping Use   Vaping status: Never Used  Substance and Sexual Activity   Alcohol use: Not Currently    Alcohol/week: 2.0 - 3.0 standard drinks of alcohol    Types: 2 - 3 Glasses of wine per week   Drug use: Yes    Types: Marijuana    Comment: Smokes marijuana weekly.  Has not used cocaine in 9 years.   Sexual activity: Yes    Birth control/protection: None  Other Topics Concern   Not on file  Social History Narrative   Lives in [place] by herself.  She had been caring for her mother but she died 2 mos ago.  She tries to remain active but does not regularly exercise.   Social Drivers of Corporate investment banker Strain: Low Risk  (10/31/2023)   Overall Financial Resource Strain (CARDIA)    Difficulty of Paying Living Expenses: Not very hard  Food Insecurity: Food Insecurity Present (10/31/2023)   Hunger Vital Sign    Worried About Running Out of Food in the Last Year: Sometimes true    Ran Out of Food in the Last Year: Sometimes  true  Transportation Needs: No Transportation Needs (10/31/2023)   PRAPARE - Administrator, Civil Service (Medical): No    Lack of Transportation (Non-Medical): No  Physical Activity: Insufficiently Active (10/31/2023)   Exercise Vital Sign    Days of Exercise per Week: 3 days    Minutes of Exercise per Session: 20 min  Stress: Stress Concern Present (10/31/2023)   [employer] of Occupational Health - Occupational Stress Questionnaire    Feeling of Stress : Rather much  Social Connections: Moderately Integrated (10/31/2023)   Social Connection and Isolation Panel    Frequency of Communication with Friends and Family: More than three times a week    Frequency of Social Gatherings with Friends and Family: Once a week    Attends Religious Services: More than 4 times per year    Active Member of [employer] or Organizations: Yes    Attends Banker Meetings: More than 4 times per year    Marital Status: Never married  Intimate Partner Violence: Not At Risk (10/31/2023)   Humiliation, Afraid, Rape, and Kick questionnaire    Fear of Current or Ex-Partner: No    Emotionally Abused: No    Physically Abused: No    Sexually Abused: No    Family History  Problem Relation Age of Onset   Diabetes Father        deceased   Cancer Mother 41       colon   Stroke Mother    Hypertension Sister        alive and well   Hypertension Sister        alive and well   Heart attack Brother        deceased @ 26    Past Surgical History:  Procedure Laterality Date   CARDIOVERSION N/A 11/04/2013   Procedure: CARDIOVERSION;  Surgeon: Peter M Swaziland, MD;  Location: [facility] ENDOSCOPY;  Service: Cardiovascular;  Laterality: N/A;   CESAREAN SECTION      ROS: Review of Systems Negative except as stated above  PHYSICAL EXAM: BP  135/76   Pulse (!) 56   Ht 5' 7 (1.702 m)   Wt 185 lb (83.9 kg)   SpO2 100%   BMI 28.98 kg/m   Wt Readings from Last 3 Encounters:  07/04/24 185  lb (83.9 kg)  06/17/24 189 lb (85.7 kg)  04/18/24 189 lb 3.2 oz (85.8 kg)    Physical Exam  General appearance - alert, well appearing, and in no distress Mental status - normal mood, behavior, speech, dress, motor activity, and thought processes Neck - supple, no significant adenopathy Chest - clear to auscultation, no wheezes, rales or rhonchi, symmetric air entry Heart - normal rate, regular rhythm, normal S1, S2, no murmurs, rubs, clicks or gallops Extremities - peripheral pulses normal, no pedal edema, no clubbing or cyanosis Diabetic Foot Exam - Simple   Simple Foot Form Diabetic Foot exam was performed with the following findings: Yes 07/04/2024  4:44 PM  Visual Inspection See comments: Yes Sensation Testing Intact to touch and monofilament testing bilaterally: Yes Pulse Check Posterior Tibialis and Dorsalis pulse intact bilaterally: Yes Comments Non-inflam         Latest Ref Rng & Units 10/31/2023    4:53 PM 12/22/2022   11:25 AM 08/10/2022    1:32 PM  CMP  Glucose 70 - 99 mg/dL 95  893  95   BUN 8 - 27 mg/dL 14  12  9    Creatinine 0.57 - 1.00 mg/dL 9.00  9.08  9.12   Sodium 134 - 144 mmol/L 143  136  137   Potassium 3.5 - 5.2 mmol/L 4.0  3.9  3.7   Chloride 96 - 106 mmol/L 103  100  103   CO2 20 - 29 mmol/L 25  26  22    Calcium  8.7 - 10.3 mg/dL 89.6  89.3  89.9   Total Protein 6.0 - 8.5 g/dL 7.9     Total Bilirubin 0.0 - 1.2 mg/dL 0.3     Alkaline Phos 44 - 121 IU/L 40     AST 0 - 40 IU/L 12     ALT 0 - 32 IU/L 10      Lipid Panel     Component Value Date/Time   CHOL 143 10/31/2023 1653   TRIG 105 10/31/2023 1653   HDL 58 10/31/2023 1653   CHOLHDL 2.5 10/31/2023 1653   CHOLHDL 2.9 09/12/2016 0939   VLDL NOT CALC 09/12/2016 0939   LDLCALC 66 10/31/2023 1653    CBC    Component Value Date/Time   WBC 5.5 10/31/2023 1653   WBC 4.3 12/22/2022 1125   RBC 3.85 10/31/2023 1653   RBC 3.71 (L) 12/22/2022 1125   HGB 12.7 10/31/2023 1653   HCT 38.5  10/31/2023 1653   PLT 196 10/31/2023 1653   MCV 100 (H) 10/31/2023 1653   MCH 33.0 10/31/2023 1653   MCH 31.8 12/22/2022 1125   MCHC 33.0 10/31/2023 1653   MCHC 33.8 12/22/2022 1125   RDW 13.1 10/31/2023 1653   LYMPHSABS 1.8 05/17/2022 1201   MONOABS 246 08/18/2016 1114   EOSABS 0.1 05/17/2022 1201   BASOSABS 0.0 05/17/2022 1201    ASSESSMENT AND PLAN:  Assessment and Plan Assessment & Plan Type 2 diabetes mellitus with other specified complication without long-term use of insulin  Type 2 diabetes treated with oral medication Diabetes well-controlled with A1c 6.5% and blood sugar 98 mg/dL. Adherent to metformin , good diet, and exercise. - Continue metformin  1000 mg twice daily. - Encourage continued healthy eating habits and regular  exercise.  Hypertension associated with type 2 diabetes At goal. On carvedilol  and losartan , no home monitor, limits salt intake. - Continue carvedilol  3.25 mg twice daily. - Continue losartan  25 mg daily. - Encourage limiting salt intake.  PAF On Pradaxa , adherent, requires refill. - Refill Pradaxa  prescription and send to the pharmacy.  Hyperlipidemia associated with type 2 diabetes On fenofibrate , adherent. - Continue fenofibrate  as prescribed.  Anxiety and insomnia Hydroxyzine  causes side effects. Anxiety and insomnia due to stressors.  Declines referral to behavioral health for counseling.  If she changes her mind she will let me know.   - Discontinue hydroxyzine . - Prescribe trazodone  50 mg, instructing to take half to one tablet at bedtime, starting with half a tablet.  Evaluation for anemia Off iron supplements for over a year, last blood count normal, inquires about restarting iron. - Order blood count  to evaluate current status.      Patient was given the opportunity to ask questions.  Patient verbalized understanding of the plan and was able to repeat key elements of the plan.   This documentation was completed using Dietitian.  Any transcriptional errors are unintentional.  Orders Placed This Encounter  Procedures   POCT glycosylated hemoglobin (Hb A1C)   POCT glucose (manual entry)     Requested Prescriptions    No prescriptions requested or ordered in this encounter    No follow-ups on file.  Barnie Louder, MD, FACP

## 2024-07-04 NOTE — Patient Instructions (Signed)
 VISIT SUMMARY:  You came in today for your four-month follow-up visit to manage your diabetes, hypertension, hyperlipidemia, and atrial fibrillation. We reviewed your recent lab results and discussed your current medications and any side effects you are experiencing.  YOUR PLAN:  -TYPE 2 DIABETES MELLITUS: Your diabetes is well-controlled with an A1c of 6.5% and a blood sugar level of 98 mg/dL. Continue taking metformin  1000 mg twice daily, maintain your healthy eating habits, and keep up with your regular exercise.  -ESSENTIAL HYPERTENSION: Your blood pressure is slightly above the target at 135/76 mmHg. Continue taking carvedilol  3.25 mg twice daily and losartan  25 mg daily. Keep limiting your salt intake, and we will recheck your blood pressure during the visit.  -ATRIAL FIBRILLATION: Atrial fibrillation is an irregular and often rapid heart rate. You are doing well on Pradaxa , and I have sent a refill for this medication to your pharmacy.  -HYPERLIPIDEMIA: Hyperlipidemia means you have high levels of fats in your blood. Continue taking fenofibrate  as prescribed.  -ANXIETY AND INSOMNIA: You have been experiencing anxiety and difficulty sleeping, partly due to recent stressors. Since hydroxyzine  causes side effects, we will discontinue it and start you on trazodone  50 mg. Take half to one tablet at bedtime, starting with half a tablet.  -EVALUATION FOR ANEMIA: Anemia is a condition where you don't have enough healthy red blood cells. Since you have been off iron supplements for over a year and your last blood count was normal, we will order blood count and iron level tests to evaluate your current status.  INSTRUCTIONS:  We will recheck your blood pressure during this visit. I have also ordered blood count and iron level tests to evaluate your current status. Please continue with your current medications and lifestyle habits, and start the new medication for anxiety and insomnia as discussed.  If you have any concerns or experience any side effects, please contact the office.

## 2024-07-05 ENCOUNTER — Ambulatory Visit: Payer: Self-pay | Admitting: Internal Medicine

## 2024-07-05 LAB — CBC
Hematocrit: 37 % (ref 34.0–46.6)
Hemoglobin: 12 g/dL (ref 11.1–15.9)
MCH: 31.9 pg (ref 26.6–33.0)
MCHC: 32.4 g/dL (ref 31.5–35.7)
MCV: 98 fL — ABNORMAL HIGH (ref 79–97)
Platelets: 214 x10E3/uL (ref 150–450)
RBC: 3.76 x10E6/uL — ABNORMAL LOW (ref 3.77–5.28)
RDW: 13.2 % (ref 11.7–15.4)
WBC: 5.5 x10E3/uL (ref 3.4–10.8)

## 2024-07-18 ENCOUNTER — Ambulatory Visit: Attending: Cardiology | Admitting: Internal Medicine

## 2024-07-18 ENCOUNTER — Encounter: Payer: Self-pay | Admitting: Internal Medicine

## 2024-07-18 VITALS — BP 122/68 | HR 56 | Ht 67.0 in | Wt 187.7 lb

## 2024-07-18 DIAGNOSIS — I480 Paroxysmal atrial fibrillation: Secondary | ICD-10-CM | POA: Diagnosis not present

## 2024-07-18 NOTE — Progress Notes (Signed)
 Cardiology Office Note   Date:  07/18/2024   ID:  Jane Arellano, Jane Arellano 20-Oct-1961, MRN [id number]  PCP:  Vicci Barnie NOVAK, MD  Cardiologist:   Jane Gull, MD   Pt presents for follow up of PAF   History of Present Illness: Jane Arellano is a 63 y.o. female with a history of PAF (first dx in 2016; patient asymptomatic)   Followed in Afib clinic   CHADS2VASc score of at least 3    SHe was last seen in clinic in March 2024   Pt previously followed by Jane Arellano   Also a hx of T2DM, GERD, HTN, atypical CP, subaortic membrane (s/p resection in 2015)  Mld/mod AR, mild AS   Since seen the pt denies palpitations.  Breathing is OK  No dizziness   No CP      Current Meds  Medication Sig   Accu-Chek Softclix Lancets lancets Use to check blood sugar once daily. E11.40   Blood Glucose Calibration (ACCU-CHEK AVIVA) SOLN USE AS INSTRUCTED   Blood Glucose Monitoring Suppl (ACCU-CHEK GUIDE) w/Device KIT Use to check blood sugar once daily. E11.40   Calcium  Carb-Cholecalciferol (CALCIUM -VITAMIN D3) 500-400 MG-UNIT TABS Take 1 tablet by mouth daily.   carvedilol  (COREG ) 3.125 MG tablet TAKE 1 TABLET BY MOUTH TWICE A DAY   cetirizine  (ZYRTEC ) 10 MG tablet TAKE 1 TABLET BY MOUTH DAILY AS NEEDED FOR ALLERGIES. AS NEEDED FOR NASAL CONGESTION   famotidine  (PEPCID ) 40 MG tablet TAKE 1 TABLET BY MOUTH EVERYDAY AT BEDTIME   fenofibrate  (TRICOR ) 145 MG tablet TAKE 1 TABLET BY MOUTH EVERY DAY   ferrous sulfate  325 (65 FE) MG tablet TAKE 1 TABLET BY MOUTH THREE TIMES WEEKLY AFTER A MEAL   folic acid  (FOLVITE ) 800 MCG tablet Take 800 mcg by mouth daily.   furosemide  (LASIX ) 20 MG tablet TAKE 1 TABLET BY MOUTH EVERY DAY   gabapentin  (NEURONTIN ) 600 MG tablet TAKE 1 TABLET BY MOUTH THREE TIMES A DAY   glucose blood (ACCU-CHEK GUIDE) test strip Use to check blood sugar once daily. E11.40   losartan  (COZAAR ) 25 MG tablet Take 1 tablet (25 mg total) by mouth daily.   metFORMIN  (GLUCOPHAGE ) 1000 MG tablet TAKE 1  TABLET BY MOUTH TWICE A DAY WITH FOOD   Multiple Vitamin (MULTIVITAMIN WITH MINERALS) TABS tablet Take 1 tablet by mouth daily.   Omega-3 Fatty Acids  (FISH OIL ) 1000 MG CPDR Take 3 g by mouth daily. (Patient taking differently: Take 2 capsules by mouth daily.)   omeprazole  (PRILOSEC) 40 MG capsule TAKE 1 CAPSULE (40 MG TOTAL) BY MOUTH DAILY.   PRADAXA  150 MG CAPS capsule TAKE 1 CAPSULE BY MOUTH TWICE A DAY   traZODone  (DESYREL ) 50 MG tablet Take 0.5-1 tablets (25-50 mg total) by mouth at bedtime as needed for sleep.     Allergies:   Xarelto  [rivaroxaban ], Amoxicillin , Atorvastatin , Hydroxyzine , Ibuprofen, and Ketorolac tromethamine   Past Medical History:  Diagnosis Date   Atypical chest pain    a. 11/2005 Negative Myoview   Diastolic dysfunction    DM2 (diabetes mellitus, type 2) (HCC)    Dysrhythmia    AFIB HX CV    GERD (gastroesophageal reflux disease)    Heart murmur    History of substance abuse (HCC)    HTN (hypertension)    Iron deficiency anemia    Paroxysmal atrial fibrillation (HCC) 05/01/2013   On Xarelto    Subaortic membrane    a. 01/2010 Echo: EF 55-60%, No rwma, subaortic membrane with  elevated LVOT mean gradient of 21 mmHg, Triv AI, mod dil LA, mildly increased PASP. b. 05/02/2013 Echo:  LVEF 60-65%, grade 1 diastolic dysfunction, mild LVH, subaortic stenosis w/ turbulation in LVOT c/w subaortic membrane (mean gradient 42 mmHg/peak gradient 81 mmHg), mild biatrial enlargement    Past Surgical History:  Procedure Laterality Date   CARDIOVERSION N/A 11/04/2013   Procedure: CARDIOVERSION;  Surgeon: Peter M Swaziland, MD;  Location: [facility] ENDOSCOPY;  Service: Cardiovascular;  Laterality: N/A;   CESAREAN SECTION       Social History:  The patient  reports that she has never smoked. Her smokeless tobacco use includes snuff. She reports that she does not currently use alcohol after a past usage of about 2.0 - 3.0 standard drinks of alcohol per week. She reports current drug use.  Drug: Marijuana.   Family History:  The patient's family history includes Cancer (age of onset: 28) in her mother; Diabetes in her father; Heart attack in her brother; Hypertension in her sister and sister; Stroke in her mother.    ROS:  Please see the history of present illness. All other systems are reviewed and  Negative to the above problem except as noted.    PHYSICAL EXAM: VS:  BP 122/68   Pulse (!) 56   Ht 5' 7 (1.702 m)   Wt 187 lb 11.2 oz (85.1 kg)   SpO2 96%   BMI 29.40 kg/m   GEN: Well nourished, well developed, in no acute distress  HEENT: normal  Neck: no JVD, carotid bruits, Cardiac: RRR  No S3  No murmurs  Respiratory:  clear to auscultation GI: soft, nontender,   No hepatomegaly  Ext  No LE edema   EKG:  EKG is ordered today.  Sinus bradycardia  56 bpm  Occasional PACs   Echo 2022   1. Left ventricular ejection fraction, by estimation, is 55 to 60%. The  left ventricle has normal function. The left ventricle has no regional  wall motion abnormalities. Left ventricular diastolic parameters were  normal.   2. Right ventricular systolic function is low normal. The right  ventricular size is normal. There is normal pulmonary artery systolic  pressure.   3. Left atrial size was moderately dilated.   4. Right atrial size was mildly dilated.   5. The mitral valve is normal in structure. No evidence of mitral valve  regurgitation.   6. The aortic valve is tricuspid. Aortic valve regurgitation is mild to  moderate. Mild aortic valve stenosis. Aortic valve mean gradient measures  11.7 mmHg.   7. Aortic dilatation noted. There is borderline dilatation of the  ascending aorta, measuring 38 mm.   8. The inferior vena cava is normal in size with greater than 50%  respiratory variability, suggesting right atrial pressure of 3 mmHg.   Comparison(s): No significant change from prior study.    Lipid Panel    Component Value Date/Time   CHOL 143 10/31/2023 1653    TRIG 105 10/31/2023 1653   HDL 58 10/31/2023 1653   CHOLHDL 2.5 10/31/2023 1653   CHOLHDL 2.9 09/12/2016 0939   VLDL NOT CALC 09/12/2016 0939   LDLCALC 66 10/31/2023 1653      Wt Readings from Last 3 Encounters:  07/18/24 187 lb 11.2 oz (85.1 kg)  07/04/24 185 lb (83.9 kg)  06/17/24 189 lb (85.7 kg)      ASSESSMENT AND PLAN:  1  PAF   Pt denies palpitations   CUrrently in SR  Keep on same regimen     Continue Pradaxa   2.  HTN  BP is well controlled     3  LIpids   LDL 66  HDL 58  Trig 105  4  DM  A1C 6.5  Reviewed diet  Limit carbs     Current medicines are reviewed at length with the patient today.  The patient does not have concerns regarding medicines.  Signed, Jane Gull, MD  07/18/2024 11:41 AM    [facility] [address] [place], [place], [place]  [address] Phone: [phone]; Fax: [phone]

## 2024-07-18 NOTE — Patient Instructions (Signed)
 Medication Instructions:  Your physician recommends that you continue on your current medications as directed. Please refer to the Current Medication list given to you today.  *If you need a refill on your cardiac medications before your next appointment, please call your pharmacy*  Follow-Up: At [facility], you and your health needs are our priority.  As part of our continuing mission to provide you with exceptional heart care, our providers are all part of one team.  This team includes your primary Cardiologist (physician) and Advanced Practice Providers or APPs (Physician Assistants and Nurse Practitioners) who all work together to provide you with the care you need, when you need it.  Your next appointment:   1 year(s)  Provider:   Vina Gull, MD   We recommend signing up for the patient portal called MyChart.  Sign up information is provided on this After Visit Summary.  MyChart is used to connect with patients for Virtual Visits (Telemedicine).  Patients are able to view lab/test results, encounter notes, upcoming appointments, etc.  Non-urgent messages can be sent to your provider as well.    To learn more about what you can do with MyChart, go to [email or web address].

## 2024-08-11 ENCOUNTER — Other Ambulatory Visit: Payer: Self-pay | Admitting: Internal Medicine

## 2024-08-11 DIAGNOSIS — I10 Essential (primary) hypertension: Secondary | ICD-10-CM

## 2024-08-11 DIAGNOSIS — J302 Other seasonal allergic rhinitis: Secondary | ICD-10-CM

## 2024-09-06 ENCOUNTER — Ambulatory Visit: Attending: Internal Medicine

## 2024-09-06 ENCOUNTER — Ambulatory Visit

## 2024-09-06 ENCOUNTER — Other Ambulatory Visit: Payer: Self-pay

## 2024-09-06 DIAGNOSIS — Z23 Encounter for immunization: Secondary | ICD-10-CM | POA: Diagnosis not present

## 2024-09-06 MED FILL — RSVPreF3 Vaccine Recomb Adjuvanted For IM Susp 120 MCG/0.5ML: 0.5000 mL | INTRAMUSCULAR | 1 days supply | Qty: 1 | Fill #0 | Status: AC

## 2024-09-06 NOTE — Progress Notes (Signed)
Flu vaccine administered per protocols.  Information sheet given. Patient denies and pain or discomfort at injection site. Tolerated injection well no reaction.  

## 2024-09-07 ENCOUNTER — Other Ambulatory Visit: Payer: Self-pay | Admitting: Internal Medicine

## 2024-09-07 DIAGNOSIS — E118 Type 2 diabetes mellitus with unspecified complications: Secondary | ICD-10-CM

## 2024-09-07 DIAGNOSIS — I152 Hypertension secondary to endocrine disorders: Secondary | ICD-10-CM

## 2024-09-09 ENCOUNTER — Ambulatory Visit: Payer: Self-pay

## 2024-09-09 ENCOUNTER — Ambulatory Visit: Admitting: Family Medicine

## 2024-09-09 ENCOUNTER — Encounter: Payer: Self-pay | Admitting: Family Medicine

## 2024-09-09 VITALS — BP 138/74 | HR 56 | Ht 67.0 in | Wt 186.0 lb

## 2024-09-09 DIAGNOSIS — T881XXA Other complications following immunization, not elsewhere classified, initial encounter: Secondary | ICD-10-CM

## 2024-09-09 DIAGNOSIS — T50Z95A Adverse effect of other vaccines and biological substances, initial encounter: Secondary | ICD-10-CM

## 2024-09-09 MED ORDER — TRAMADOL HCL 50 MG PO TABS: 50.0000 mg | ORAL_TABLET | Freq: Four times a day (QID) | ORAL | 0 refills | Status: DC | PRN

## 2024-09-09 MED ORDER — TRAMADOL HCL 50 MG PO TABS: 50.0000 mg | ORAL_TABLET | Freq: Four times a day (QID) | ORAL | 0 refills | Status: AC | PRN

## 2024-09-09 NOTE — Telephone Encounter (Signed)
 noted

## 2024-09-09 NOTE — Telephone Encounter (Signed)
 FYI Only or Action Required?: FYI only for provider.  Patient was last seen in primary care on 07/04/2024 by Vicci Barnie NOVAK, MD.  Called Nurse Triage reporting Flu Vaccine. And RSV - body aches no sleep. Burning in chest and back - mostly resolved.  Symptoms began yesterday.  Interventions attempted: OTC medications: tylenol .  Symptoms are: unchanged.  Triage Disposition: Home Care  Patient/caregiver understands and will follow disposition?: No, refuses disposition - pt wishes to be seen. Appt made for PCE.                     Copied from CRM [id number]. Topic: Clinical - Red Word Triage >> Sep 09, 2024  7:49 AM Ivette P wrote: Red Word that prompted transfer to Nurse Triage:pt had flu shot and Rsv on firday pt is  taking tylenol  and doing not good. fever. chest is hurting. reactions is burning. Reason for Disposition  RSV vaccine reactions  Answer Assessment - Initial Assessment Questions 1. SYMPTOMS: What is the main symptom? (e.g., pain, redness, or swelling at injection site; feeling tired, fever, muscle aches)      Burning feeling in chest and back, Nausea 2. ONSET: When was the vaccine (shot) given? How much later did the last night begin? (e.g., hours, days ago)      Last night 3. SEVERITY: How bad is it?      moderate 4. FEVER: Do you have a fever? If Yes, ask: What is your temperature, how was it measured, and when did it start?      Taking Tylenol  5. IMMUNIZATIONS GIVEN: What shots have you recently received?     FLU and RSV 6. PAST REACTIONS: Have you reacted to immunizations before? If Yes, ask: What happened?     no 7. OTHER SYMPTOMS: Do you have any other symptoms?     Hurting  Protocols used: Immunization Reactions-A-AH

## 2024-09-10 NOTE — Progress Notes (Signed)
 New Patient Office Visit  Subjective    Patient ID: Jane Arellano, female    DOB: [birth year]  Age: 63 y.o. MRN: [id number]  CC:  Chief Complaint  Patient presents with   vaccine reaction     Pt reports she had flu and RSV vaccines at [facility] on Friday and has been feeling bad since then. Pt reports headaches, back burning, feeling dizzy and nauseas     HPI Jane Arellano presents with complaint of having taken the RSV shot and now with headaches, nausea nd back pain. She reports that she feels badly but may be improving slightly.    Outpatient Encounter Medications as of 09/09/2024  Medication Sig   Accu-Chek Softclix Lancets lancets Use to check blood sugar once daily. E11.40   Blood Glucose Calibration (ACCU-CHEK AVIVA) SOLN USE AS INSTRUCTED   Blood Glucose Monitoring Suppl (ACCU-CHEK GUIDE) w/Device KIT Use to check blood sugar once daily. E11.40   Calcium  Carb-Cholecalciferol (CALCIUM -VITAMIN D3) 500-400 MG-UNIT TABS Take 1 tablet by mouth daily.   carvedilol  (COREG ) 3.125 MG tablet TAKE 1 TABLET BY MOUTH TWICE A DAY   cetirizine  (ZYRTEC ) 10 MG tablet TAKE 1 TABLET BY MOUTH DAILY AS NEEDED FOR ALLERGIES. AS NEEDED FOR NASAL CONGESTION   famotidine  (PEPCID ) 40 MG tablet TAKE 1 TABLET BY MOUTH EVERYDAY AT BEDTIME   fenofibrate  (TRICOR ) 145 MG tablet TAKE 1 TABLET BY MOUTH EVERY DAY   ferrous sulfate  325 (65 FE) MG tablet TAKE 1 TABLET BY MOUTH THREE TIMES WEEKLY AFTER A MEAL   folic acid  (FOLVITE ) 800 MCG tablet Take 800 mcg by mouth daily.   furosemide  (LASIX ) 20 MG tablet TAKE 1 TABLET BY MOUTH EVERY DAY   gabapentin  (NEURONTIN ) 600 MG tablet TAKE 1 TABLET BY MOUTH THREE TIMES A DAY   glucose blood (ACCU-CHEK GUIDE) test strip Use to check blood sugar once daily. E11.40   Multiple Vitamin (MULTIVITAMIN WITH MINERALS) TABS tablet Take 1 tablet by mouth daily.   Omega-3 Fatty Acids  (FISH OIL ) 1000 MG CPDR Take 3 g by mouth daily. (Patient taking differently: Take 2 capsules by mouth  daily.)   omeprazole  (PRILOSEC) 40 MG capsule TAKE 1 CAPSULE (40 MG TOTAL) BY MOUTH DAILY.   PRADAXA  150 MG CAPS capsule TAKE 1 CAPSULE BY MOUTH TWICE A DAY   traZODone  (DESYREL ) 50 MG tablet Take 0.5-1 tablets (25-50 mg total) by mouth at bedtime as needed for sleep.   [DISCONTINUED] losartan  (COZAAR ) 25 MG tablet Take 1 tablet (25 mg total) by mouth daily.   [DISCONTINUED] metFORMIN  (GLUCOPHAGE ) 1000 MG tablet TAKE 1 TABLET BY MOUTH TWICE A DAY WITH FOOD   [DISCONTINUED] traMADol  (ULTRAM ) 50 MG tablet Take 1 tablet (50 mg total) by mouth every 6 (six) hours as needed for up to 5 days for severe pain (pain score 7-10).   traMADol  (ULTRAM ) 50 MG tablet Take 1 tablet (50 mg total) by mouth every 6 (six) hours as needed for up to 5 days for severe pain (pain score 7-10).   [DISCONTINUED] potassium chloride  (K-DUR,KLOR-CON ) 10 MEQ tablet Take 1 tablet (10 mEq total) by mouth daily.   No facility-administered encounter medications on file as of 09/09/2024.    Past Medical History:  Diagnosis Date   Atypical chest pain    a. 11/2005 Negative Myoview   Diastolic dysfunction    DM2 (diabetes mellitus, type 2) (HCC)    Dysrhythmia    AFIB HX CV    GERD (gastroesophageal reflux disease)    Heart murmur  History of substance abuse (HCC)    HTN (hypertension)    Iron deficiency anemia    Paroxysmal atrial fibrillation (HCC) 05/01/2013   On Xarelto    Subaortic membrane    a. 01/2010 Echo: EF 55-60%, No rwma, subaortic membrane with elevated LVOT mean gradient of 21 mmHg, Triv AI, mod dil LA, mildly increased PASP. b. 05/02/2013 Echo:  LVEF 60-65%, grade 1 diastolic dysfunction, mild LVH, subaortic stenosis w/ turbulation in LVOT c/w subaortic membrane (mean gradient 42 mmHg/peak gradient 81 mmHg), mild biatrial enlargement    Past Surgical History:  Procedure Laterality Date   CARDIOVERSION N/A 11/04/2013   Procedure: CARDIOVERSION;  Surgeon: Peter M Swaziland, MD;  Location: [facility] ENDOSCOPY;   Service: Cardiovascular;  Laterality: N/A;   CESAREAN SECTION      Family History  Problem Relation Age of Onset   Diabetes Father        deceased   Cancer Mother 5       colon   Stroke Mother    Hypertension Sister        alive and well   Hypertension Sister        alive and well   Heart attack Brother        deceased @ 38    Social History   Socioeconomic History   Marital status: Single    Spouse name: Not on file   Number of children: Not on file   Years of education: Not on file   Highest education level: Not on file  Occupational History   Not on file  Tobacco Use   Smoking status: Never   Smokeless tobacco: Current    Types: Snuff   Tobacco comments:    Currently snuff 02/13/23  Vaping Use   Vaping status: Never Used  Substance and Sexual Activity   Alcohol use: Not Currently    Alcohol/week: 2.0 - 3.0 standard drinks of alcohol    Types: 2 - 3 Glasses of wine per week   Drug use: Yes    Types: Marijuana    Comment: Smokes marijuana weekly.  Has not used cocaine in 9 years.   Sexual activity: Yes    Birth control/protection: None  Other Topics Concern   Not on file  Social History Narrative   Lives in [place] by herself.  She had been caring for her mother but she died 2 mos ago.  She tries to remain active but does not regularly exercise.   Social Drivers of Corporate investment banker Strain: Low Risk  (10/31/2023)   Overall Financial Resource Strain (CARDIA)    Difficulty of Paying Living Expenses: Not very hard  Food Insecurity: Food Insecurity Present (10/31/2023)   Hunger Vital Sign    Worried About Running Out of Food in the Last Year: Sometimes true    Ran Out of Food in the Last Year: Sometimes true  Transportation Needs: No Transportation Needs (10/31/2023)   PRAPARE - Administrator, Civil Service (Medical): No    Lack of Transportation (Non-Medical): No  Physical Activity: Insufficiently Active (10/31/2023)   Exercise Vital  Sign    Days of Exercise per Week: 3 days    Minutes of Exercise per Session: 20 min  Stress: Stress Concern Present (10/31/2023)   [employer] of Occupational Health - Occupational Stress Questionnaire    Feeling of Stress : Rather much  Social Connections: Moderately Integrated (10/31/2023)   Social Connection and Isolation Panel    Frequency of Communication  with Friends and Family: More than three times a week    Frequency of Social Gatherings with Friends and Family: Once a week    Attends Religious Services: More than 4 times per year    Active Member of [employer] or Organizations: Yes    Attends Banker Meetings: More than 4 times per year    Marital Status: Never married  Intimate Partner Violence: Not At Risk (10/31/2023)   Humiliation, Afraid, Rape, and Kick questionnaire    Fear of Current or Ex-Partner: No    Emotionally Abused: No    Physically Abused: No    Sexually Abused: No    Review of Systems  All other systems reviewed and are negative.       Objective   BP 138/74   Pulse (!) 56   Ht 5' 7 (1.702 m)   Wt 186 lb (84.4 kg)   SpO2 98%   BMI 29.13 kg/m   Physical Exam Vitals and nursing note reviewed.  Constitutional:      General: She is not in acute distress.    Appearance: She is ill-appearing.  Cardiovascular:     Rate and Rhythm: Normal rate and regular rhythm.  Pulmonary:     Effort: Pulmonary effort is normal.     Breath sounds: Normal breath sounds.  Abdominal:     Palpations: Abdomen is soft.     Tenderness: There is no abdominal tenderness.  Neurological:     General: No focal deficit present.     Mental Status: She is alert and oriented to person, place, and time.         Assessment & Plan:   Adverse effect of vaccine, initial encounter  Other orders -     traMADol  HCl; Take 1 tablet (50 mg total) by mouth every 6 (six) hours as needed for up to 5 days for severe pain (pain score 7-10).  Dispense: 12 tablet;  Refill: 0   Probable effects after RSV immunization. Tramadol  prescribed. Tylenol /nsaids prn. Adequate fluids/rest recommended.   No follow-ups on file.   Tanda Raguel SQUIBB, MD

## 2024-10-31 ENCOUNTER — Other Ambulatory Visit: Payer: Self-pay | Admitting: Internal Medicine

## 2024-10-31 DIAGNOSIS — E1140 Type 2 diabetes mellitus with diabetic neuropathy, unspecified: Secondary | ICD-10-CM

## 2024-10-31 DIAGNOSIS — I10 Essential (primary) hypertension: Secondary | ICD-10-CM

## 2024-11-04 ENCOUNTER — Ambulatory Visit: Admitting: Internal Medicine

## 2024-11-26 ENCOUNTER — Other Ambulatory Visit: Payer: Self-pay | Admitting: Internal Medicine

## 2024-11-26 DIAGNOSIS — E785 Hyperlipidemia, unspecified: Secondary | ICD-10-CM

## 2024-12-21 ENCOUNTER — Other Ambulatory Visit: Payer: Self-pay | Admitting: Internal Medicine

## 2024-12-21 DIAGNOSIS — I10 Essential (primary) hypertension: Secondary | ICD-10-CM

## 2024-12-24 ENCOUNTER — Ambulatory Visit: Admitting: Internal Medicine
